# Patient Record
Sex: Male | Born: 1935
Health system: Southern US, Community
[De-identification: ages and names within clinical notes are randomized; demographics above are authoritative.]

## PROBLEM LIST (undated history)

## (undated) DIAGNOSIS — N3289 Other specified disorders of bladder: Secondary | ICD-10-CM

## (undated) DIAGNOSIS — R634 Abnormal weight loss: Secondary | ICD-10-CM

## (undated) DIAGNOSIS — F329 Major depressive disorder, single episode, unspecified: Secondary | ICD-10-CM

## (undated) DIAGNOSIS — N400 Enlarged prostate without lower urinary tract symptoms: Secondary | ICD-10-CM

## (undated) DIAGNOSIS — I6529 Occlusion and stenosis of unspecified carotid artery: Secondary | ICD-10-CM

## (undated) DIAGNOSIS — K5730 Diverticulosis of large intestine without perforation or abscess without bleeding: Secondary | ICD-10-CM

## (undated) DIAGNOSIS — Z8546 Personal history of malignant neoplasm of prostate: Secondary | ICD-10-CM

## (undated) DIAGNOSIS — K645 Perianal venous thrombosis: Secondary | ICD-10-CM

## (undated) DIAGNOSIS — N170 Acute kidney failure with tubular necrosis: Secondary | ICD-10-CM

## (undated) DIAGNOSIS — E7800 Pure hypercholesterolemia, unspecified: Secondary | ICD-10-CM

## (undated) DIAGNOSIS — L719 Rosacea, unspecified: Secondary | ICD-10-CM

## (undated) DIAGNOSIS — M109 Gout, unspecified: Secondary | ICD-10-CM

## (undated) DIAGNOSIS — I10 Essential (primary) hypertension: Secondary | ICD-10-CM

## (undated) HISTORY — PX: CAROTID ENDARTERECTOMY: SUR193

## (undated) HISTORY — DX: Rosacea, unspecified: L71.9

## (undated) HISTORY — DX: Benign prostatic hyperplasia without lower urinary tract symptoms: N40.0

## (undated) HISTORY — DX: Other specified disorders of bladder: N32.89

## (undated) HISTORY — PX: CATARACT EXTRACTION: SUR2

## (undated) HISTORY — DX: Major depressive disorder, single episode, unspecified: F32.9

## (undated) HISTORY — DX: Abnormal weight loss: R63.4

## (undated) HISTORY — DX: Essential (primary) hypertension: I10

## (undated) HISTORY — DX: Diverticulosis of large intestine without perforation or abscess without bleeding: K57.30

## (undated) HISTORY — DX: Personal history of malignant neoplasm of prostate: Z85.46

## (undated) HISTORY — DX: Occlusion and stenosis of unspecified carotid artery: I65.29

## (undated) HISTORY — DX: Perianal venous thrombosis: K64.5

## (undated) HISTORY — DX: Gout, unspecified: M10.9

## (undated) HISTORY — DX: Acute kidney failure with tubular necrosis: N17.0

## (undated) HISTORY — DX: Pure hypercholesterolemia, unspecified: E78.00

---

## 2004-04-04 ENCOUNTER — Ambulatory Visit: Payer: Self-pay | Admitting: Internal Medicine

## 2004-07-05 ENCOUNTER — Ambulatory Visit: Payer: Self-pay | Admitting: Internal Medicine

## 2004-10-12 ENCOUNTER — Ambulatory Visit: Payer: Self-pay | Admitting: Internal Medicine

## 2005-01-06 ENCOUNTER — Ambulatory Visit: Payer: Self-pay | Admitting: Internal Medicine

## 2005-04-10 ENCOUNTER — Ambulatory Visit: Payer: Self-pay | Admitting: Internal Medicine

## 2005-04-20 ENCOUNTER — Encounter: Payer: Self-pay | Admitting: Internal Medicine

## 2005-04-20 ENCOUNTER — Ambulatory Visit: Payer: Self-pay

## 2005-07-10 ENCOUNTER — Ambulatory Visit: Payer: Self-pay | Admitting: Internal Medicine

## 2005-07-12 ENCOUNTER — Ambulatory Visit: Payer: Self-pay | Admitting: Oncology

## 2005-07-27 ENCOUNTER — Encounter: Payer: Self-pay | Admitting: Internal Medicine

## 2005-07-27 LAB — COMPREHENSIVE METABOLIC PANEL
ALT: 19 U/L (ref 0–40)
AST: 22 U/L (ref 0–37)
Albumin: 4.1 g/dL (ref 3.5–5.2)
Alkaline Phosphatase: 64 U/L (ref 39–117)
BUN: 20 mg/dL (ref 6–23)
CO2: 24 mEq/L (ref 19–32)
Calcium: 9.2 mg/dL (ref 8.4–10.5)
Chloride: 103 mEq/L (ref 96–112)
Creatinine, Ser: 1.59 mg/dL — ABNORMAL HIGH (ref 0.40–1.50)
Glucose, Bld: 103 mg/dL — ABNORMAL HIGH (ref 70–99)
Potassium: 4.4 mEq/L (ref 3.5–5.3)
Sodium: 137 mEq/L (ref 135–145)
Total Bilirubin: 0.7 mg/dL (ref 0.3–1.2)
Total Protein: 6.8 g/dL (ref 6.0–8.3)

## 2005-07-27 LAB — CBC WITH DIFFERENTIAL/PLATELET
BASO%: 0.4 % (ref 0.0–2.0)
Basophils Absolute: 0 10*3/uL (ref 0.0–0.1)
EOS%: 1.9 % (ref 0.0–7.0)
Eosinophils Absolute: 0.1 10*3/uL (ref 0.0–0.5)
HCT: 42.7 % (ref 38.7–49.9)
HGB: 14.7 g/dL (ref 13.0–17.1)
LYMPH%: 22.9 % (ref 14.0–48.0)
MCH: 31.3 pg (ref 28.0–33.4)
MCHC: 34.4 g/dL (ref 32.0–35.9)
MCV: 90.9 fL (ref 81.6–98.0)
MONO#: 0.6 10*3/uL (ref 0.1–0.9)
MONO%: 9.1 % (ref 0.0–13.0)
NEUT#: 4.4 10*3/uL (ref 1.5–6.5)
NEUT%: 65.7 % (ref 40.0–75.0)
Platelets: 183 10*3/uL (ref 145–400)
RBC: 4.7 10*6/uL (ref 4.20–5.71)
RDW: 14.1 % (ref 11.2–14.6)
WBC: 6.7 10*3/uL (ref 4.0–10.0)
lymph#: 1.5 10*3/uL (ref 0.9–3.3)

## 2005-07-27 LAB — PSA: PSA: 7.92 ng/mL — ABNORMAL HIGH (ref 0.10–4.00)

## 2005-08-10 ENCOUNTER — Encounter: Payer: Self-pay | Admitting: Internal Medicine

## 2005-08-16 ENCOUNTER — Encounter: Payer: Self-pay | Admitting: Internal Medicine

## 2005-10-23 ENCOUNTER — Ambulatory Visit: Payer: Self-pay | Admitting: Internal Medicine

## 2005-10-25 ENCOUNTER — Ambulatory Visit: Payer: Self-pay | Admitting: Oncology

## 2006-02-20 ENCOUNTER — Ambulatory Visit: Payer: Self-pay | Admitting: Internal Medicine

## 2006-07-25 DIAGNOSIS — Z8546 Personal history of malignant neoplasm of prostate: Secondary | ICD-10-CM

## 2006-07-25 DIAGNOSIS — E7800 Pure hypercholesterolemia, unspecified: Secondary | ICD-10-CM | POA: Insufficient documentation

## 2006-07-25 DIAGNOSIS — I10 Essential (primary) hypertension: Secondary | ICD-10-CM

## 2006-07-25 DIAGNOSIS — F3289 Other specified depressive episodes: Secondary | ICD-10-CM

## 2006-07-25 DIAGNOSIS — F329 Major depressive disorder, single episode, unspecified: Secondary | ICD-10-CM

## 2006-07-25 DIAGNOSIS — M109 Gout, unspecified: Secondary | ICD-10-CM

## 2006-07-25 HISTORY — DX: Other specified depressive episodes: F32.89

## 2006-07-25 HISTORY — DX: Pure hypercholesterolemia, unspecified: E78.00

## 2006-07-25 HISTORY — DX: Essential (primary) hypertension: I10

## 2006-07-25 HISTORY — DX: Major depressive disorder, single episode, unspecified: F32.9

## 2006-07-25 HISTORY — DX: Personal history of malignant neoplasm of prostate: Z85.46

## 2006-07-25 HISTORY — DX: Gout, unspecified: M10.9

## 2006-08-08 ENCOUNTER — Encounter (INDEPENDENT_AMBULATORY_CARE_PROVIDER_SITE_OTHER): Payer: Self-pay

## 2006-08-09 ENCOUNTER — Ambulatory Visit: Payer: Self-pay | Admitting: Internal Medicine

## 2006-11-26 ENCOUNTER — Encounter: Payer: Self-pay | Admitting: Internal Medicine

## 2006-12-07 ENCOUNTER — Ambulatory Visit: Payer: Self-pay | Admitting: Internal Medicine

## 2006-12-07 DIAGNOSIS — K5289 Other specified noninfective gastroenteritis and colitis: Secondary | ICD-10-CM | POA: Insufficient documentation

## 2006-12-17 ENCOUNTER — Ambulatory Visit: Payer: Self-pay | Admitting: Internal Medicine

## 2006-12-17 DIAGNOSIS — R300 Dysuria: Secondary | ICD-10-CM | POA: Insufficient documentation

## 2006-12-17 LAB — CONVERTED CEMR LAB
ALT: 43 units/L (ref 0–53)
AST: 37 units/L (ref 0–37)
Albumin: 3.4 g/dL — ABNORMAL LOW (ref 3.5–5.2)
Alkaline Phosphatase: 95 units/L (ref 39–117)
BUN: 29 mg/dL — ABNORMAL HIGH (ref 6–23)
Basophils Absolute: 0.1 10*3/uL (ref 0.0–0.1)
Basophils Relative: 0.6 % (ref 0.0–1.0)
Bilirubin Urine: NEGATIVE
Bilirubin, Direct: 0.4 mg/dL — ABNORMAL HIGH (ref 0.0–0.3)
CO2: 29 meq/L (ref 19–32)
Calcium: 9.3 mg/dL (ref 8.4–10.5)
Chloride: 102 meq/L (ref 96–112)
Creatinine, Ser: 1.9 mg/dL — ABNORMAL HIGH (ref 0.4–1.5)
Eosinophils Absolute: 0.1 10*3/uL (ref 0.0–0.6)
Eosinophils Relative: 0.6 % (ref 0.0–5.0)
GFR calc Af Amer: 44 mL/min
GFR calc non Af Amer: 37 mL/min
Glucose, Bld: 111 mg/dL — ABNORMAL HIGH (ref 70–99)
Glucose, Urine, Semiquant: NEGATIVE
HCT: 39.5 % (ref 39.0–52.0)
Hemoglobin: 14.3 g/dL (ref 13.0–17.0)
Lymphocytes Relative: 6.8 % — ABNORMAL LOW (ref 12.0–46.0)
MCHC: 35.9 g/dL (ref 30.0–36.0)
MCV: 91.9 fL (ref 78.0–100.0)
Monocytes Absolute: 0.7 10*3/uL (ref 0.2–0.7)
Monocytes Relative: 5.7 % (ref 3.0–11.0)
Neutro Abs: 11.3 10*3/uL — ABNORMAL HIGH (ref 1.4–7.7)
Nitrite: NEGATIVE
Platelets: 317 10*3/uL (ref 150–400)
Potassium: 4.6 meq/L (ref 3.5–5.1)
Protein, U semiquant: >=300
RBC: 4.41 M/uL (ref 4.22–5.81)
RDW: 12.8 % (ref 11.5–14.6)
Sodium: 140 meq/L (ref 135–145)
Specific Gravity, Urine: >=1.03
Total Bilirubin: 1.1 mg/dL (ref 0.3–1.2)
Total Protein: 6.7 g/dL (ref 6.0–8.3)
Urobilinogen, UA: 1
WBC: 12.8 10*3/uL — ABNORMAL HIGH (ref 4.5–10.5)
pH: 6

## 2006-12-20 ENCOUNTER — Telehealth: Payer: Self-pay | Admitting: Internal Medicine

## 2006-12-23 ENCOUNTER — Inpatient Hospital Stay (HOSPITAL_COMMUNITY): Admission: EM | Admit: 2006-12-23 | Discharge: 2006-12-29 | Payer: Self-pay | Admitting: Emergency Medicine

## 2006-12-23 ENCOUNTER — Ambulatory Visit: Payer: Self-pay | Admitting: Internal Medicine

## 2006-12-23 ENCOUNTER — Encounter: Payer: Self-pay | Admitting: Internal Medicine

## 2006-12-24 ENCOUNTER — Encounter: Payer: Self-pay | Admitting: Internal Medicine

## 2006-12-25 ENCOUNTER — Encounter: Payer: Self-pay | Admitting: Internal Medicine

## 2006-12-27 ENCOUNTER — Ambulatory Visit: Payer: Self-pay | Admitting: Gastroenterology

## 2007-01-01 ENCOUNTER — Ambulatory Visit: Payer: Self-pay | Admitting: Internal Medicine

## 2007-01-01 LAB — CONVERTED CEMR LAB
BUN: 39 mg/dL — ABNORMAL HIGH (ref 6–23)
Basophils Absolute: 0 10*3/uL (ref 0.0–0.1)
Basophils Relative: 0.1 % (ref 0.0–1.0)
CO2: 26 meq/L (ref 19–32)
Calcium: 8.4 mg/dL (ref 8.4–10.5)
Chloride: 105 meq/L (ref 96–112)
Creatinine, Ser: 3.4 mg/dL — ABNORMAL HIGH (ref 0.4–1.5)
Eosinophils Absolute: 0.1 10*3/uL (ref 0.0–0.6)
Eosinophils Relative: 1.3 % (ref 0.0–5.0)
GFR calc Af Amer: 23 mL/min
GFR calc non Af Amer: 19 mL/min
Glucose, Bld: 110 mg/dL — ABNORMAL HIGH (ref 70–99)
HCT: 33.3 % — ABNORMAL LOW (ref 39.0–52.0)
Hemoglobin: 11.7 g/dL — ABNORMAL LOW (ref 13.0–17.0)
Lymphocytes Relative: 14.9 % (ref 12.0–46.0)
MCHC: 35.2 g/dL (ref 30.0–36.0)
MCV: 89.3 fL (ref 78.0–100.0)
Monocytes Absolute: 0.7 10*3/uL (ref 0.2–0.7)
Monocytes Relative: 9.3 % (ref 3.0–11.0)
Neutro Abs: 5.8 10*3/uL (ref 1.4–7.7)
Neutrophils Relative %: 74.4 % (ref 43.0–77.0)
Platelets: 192 10*3/uL (ref 150–400)
Potassium: 3.8 meq/L (ref 3.5–5.1)
RBC: 3.73 M/uL — ABNORMAL LOW (ref 4.22–5.81)
RDW: 12.9 % (ref 11.5–14.6)
Sodium: 142 meq/L (ref 135–145)
WBC: 7.7 10*3/uL (ref 4.5–10.5)

## 2007-01-04 ENCOUNTER — Ambulatory Visit: Payer: Self-pay | Admitting: Internal Medicine

## 2007-01-04 DIAGNOSIS — N170 Acute kidney failure with tubular necrosis: Secondary | ICD-10-CM

## 2007-01-04 HISTORY — DX: Acute kidney failure with tubular necrosis: N17.0

## 2007-01-08 ENCOUNTER — Encounter: Payer: Self-pay | Admitting: Internal Medicine

## 2007-01-25 ENCOUNTER — Ambulatory Visit: Payer: Self-pay | Admitting: Gastroenterology

## 2007-01-25 LAB — CONVERTED CEMR LAB
BUN: 18 mg/dL (ref 6–23)
CO2: 26 meq/L (ref 19–32)
Calcium: 9.2 mg/dL (ref 8.4–10.5)
Chloride: 104 meq/L (ref 96–112)
Creatinine, Ser: 1.5 mg/dL (ref 0.4–1.5)
GFR calc Af Amer: 58 mL/min
GFR calc non Af Amer: 48 mL/min
Glucose, Bld: 99 mg/dL (ref 70–99)
Potassium: 4.3 meq/L (ref 3.5–5.1)
Sodium: 137 meq/L (ref 135–145)

## 2007-02-01 ENCOUNTER — Encounter: Payer: Self-pay | Admitting: Gastroenterology

## 2007-02-01 ENCOUNTER — Ambulatory Visit: Payer: Self-pay | Admitting: Gastroenterology

## 2007-02-01 DIAGNOSIS — K5730 Diverticulosis of large intestine without perforation or abscess without bleeding: Secondary | ICD-10-CM

## 2007-02-01 HISTORY — DX: Diverticulosis of large intestine without perforation or abscess without bleeding: K57.30

## 2007-02-08 ENCOUNTER — Ambulatory Visit (HOSPITAL_BASED_OUTPATIENT_CLINIC_OR_DEPARTMENT_OTHER): Admission: RE | Admit: 2007-02-08 | Discharge: 2007-02-08 | Payer: Self-pay | Admitting: Urology

## 2007-02-08 ENCOUNTER — Encounter (INDEPENDENT_AMBULATORY_CARE_PROVIDER_SITE_OTHER): Payer: Self-pay | Admitting: Urology

## 2007-02-11 ENCOUNTER — Ambulatory Visit: Payer: Self-pay | Admitting: Internal Medicine

## 2007-02-11 LAB — CONVERTED CEMR LAB
Cholesterol: 234 mg/dL (ref 0–200)
Direct LDL: 158.4 mg/dL
HDL: 37.3 mg/dL — ABNORMAL LOW (ref >39.0)
Total CHOL/HDL Ratio: 6.3
Triglycerides: 238 mg/dL (ref 0–149)
VLDL: 48 mg/dL — ABNORMAL HIGH (ref 0–40)

## 2007-02-18 ENCOUNTER — Telehealth: Payer: Self-pay | Admitting: Internal Medicine

## 2007-03-27 ENCOUNTER — Ambulatory Visit: Payer: Self-pay | Admitting: Internal Medicine

## 2007-03-27 DIAGNOSIS — H6120 Impacted cerumen, unspecified ear: Secondary | ICD-10-CM | POA: Insufficient documentation

## 2007-05-09 ENCOUNTER — Ambulatory Visit: Payer: Self-pay

## 2007-05-09 ENCOUNTER — Encounter: Payer: Self-pay | Admitting: Internal Medicine

## 2007-05-14 ENCOUNTER — Ambulatory Visit: Payer: Self-pay | Admitting: Internal Medicine

## 2007-06-11 ENCOUNTER — Ambulatory Visit: Payer: Self-pay | Admitting: Internal Medicine

## 2007-12-09 ENCOUNTER — Ambulatory Visit: Payer: Self-pay | Admitting: Internal Medicine

## 2008-05-20 ENCOUNTER — Encounter: Payer: Self-pay | Admitting: Internal Medicine

## 2008-05-20 ENCOUNTER — Ambulatory Visit: Payer: Self-pay

## 2008-06-09 ENCOUNTER — Ambulatory Visit: Payer: Self-pay | Admitting: Gastroenterology

## 2008-06-09 ENCOUNTER — Telehealth: Payer: Self-pay | Admitting: Gastroenterology

## 2008-06-09 DIAGNOSIS — R197 Diarrhea, unspecified: Secondary | ICD-10-CM | POA: Insufficient documentation

## 2008-06-10 LAB — CONVERTED CEMR LAB
BUN: 27 mg/dL — ABNORMAL HIGH (ref 6–23)
Basophils Absolute: 0.1 10*3/uL (ref 0.0–0.1)
Basophils Relative: 1 % (ref 0.0–3.0)
CO2: 30 meq/L (ref 19–32)
Calcium: 9.2 mg/dL (ref 8.4–10.5)
Chloride: 109 meq/L (ref 96–112)
Creatinine, Ser: 1.7 mg/dL — ABNORMAL HIGH (ref 0.4–1.5)
Eosinophils Absolute: 0.1 10*3/uL (ref 0.0–0.7)
Eosinophils Relative: 2.4 % (ref 0.0–5.0)
GFR calc non Af Amer: 41.43 mL/min (ref >60)
Glucose, Bld: 120 mg/dL — ABNORMAL HIGH (ref 70–99)
HCT: 41.1 % (ref 39.0–52.0)
Hemoglobin: 14.3 g/dL (ref 13.0–17.0)
Lymphocytes Relative: 25.7 % (ref 12.0–46.0)
Lymphs Abs: 1.6 10*3/uL (ref 0.7–4.0)
MCHC: 34.9 g/dL (ref 30.0–36.0)
MCV: 91.4 fL (ref 78.0–100.0)
Monocytes Absolute: 0.4 10*3/uL (ref 0.1–1.0)
Monocytes Relative: 6.9 % (ref 3.0–12.0)
Neutro Abs: 4 10*3/uL (ref 1.4–7.7)
Neutrophils Relative %: 64 % (ref 43.0–77.0)
Platelets: 166 10*3/uL (ref 150.0–400.0)
Potassium: 4.5 meq/L (ref 3.5–5.1)
RBC: 4.5 M/uL (ref 4.22–5.81)
RDW: 12.7 % (ref 11.5–14.6)
Sodium: 143 meq/L (ref 135–145)
WBC: 6.2 10*3/uL (ref 4.5–10.5)

## 2008-06-12 ENCOUNTER — Encounter: Payer: Self-pay | Admitting: Gastroenterology

## 2008-06-16 ENCOUNTER — Ambulatory Visit: Payer: Self-pay | Admitting: Gastroenterology

## 2008-06-16 LAB — CONVERTED CEMR LAB
BUN: 25 mg/dL — ABNORMAL HIGH (ref 6–23)
CO2: 28 meq/L (ref 19–32)
Calcium: 8.9 mg/dL (ref 8.4–10.5)
Chloride: 107 meq/L (ref 96–112)
Creatinine, Ser: 1.5 mg/dL (ref 0.4–1.5)
GFR calc non Af Amer: 47.86 mL/min (ref >60)
Glucose, Bld: 122 mg/dL — ABNORMAL HIGH (ref 70–99)
Potassium: 4.7 meq/L (ref 3.5–5.1)
Sodium: 140 meq/L (ref 135–145)

## 2008-06-18 ENCOUNTER — Telehealth: Payer: Self-pay | Admitting: Gastroenterology

## 2008-06-22 ENCOUNTER — Telehealth: Payer: Self-pay | Admitting: Gastroenterology

## 2008-06-23 ENCOUNTER — Ambulatory Visit: Payer: Self-pay | Admitting: Gastroenterology

## 2008-06-23 ENCOUNTER — Encounter: Payer: Self-pay | Admitting: Gastroenterology

## 2008-06-28 ENCOUNTER — Encounter: Payer: Self-pay | Admitting: Gastroenterology

## 2008-07-15 ENCOUNTER — Encounter: Payer: Self-pay | Admitting: Internal Medicine

## 2008-07-16 ENCOUNTER — Ambulatory Visit: Payer: Self-pay | Admitting: Internal Medicine

## 2008-07-17 LAB — CONVERTED CEMR LAB
ALT: 22 units/L (ref 0–53)
AST: 23 units/L (ref 0–37)
Albumin: 3.8 g/dL (ref 3.5–5.2)
Alkaline Phosphatase: 74 units/L (ref 39–117)
BUN: 24 mg/dL — ABNORMAL HIGH (ref 6–23)
Basophils Absolute: 0 10*3/uL (ref 0.0–0.1)
Basophils Relative: 0.6 % (ref 0.0–3.0)
Bilirubin, Direct: 0 mg/dL (ref 0.0–0.3)
CO2: 30 meq/L (ref 19–32)
Calcium: 9.4 mg/dL (ref 8.4–10.5)
Chloride: 106 meq/L (ref 96–112)
Creatinine, Ser: 1.4 mg/dL (ref 0.4–1.5)
Eosinophils Absolute: 0.1 10*3/uL (ref 0.0–0.7)
Eosinophils Relative: 1.9 % (ref 0.0–5.0)
GFR calc non Af Amer: 51.82 mL/min (ref >60)
Glucose, Bld: 96 mg/dL (ref 70–99)
HCT: 41.1 % (ref 39.0–52.0)
Hemoglobin: 14.3 g/dL (ref 13.0–17.0)
Lymphocytes Relative: 23.2 % (ref 12.0–46.0)
Lymphs Abs: 1.5 10*3/uL (ref 0.7–4.0)
MCHC: 34.7 g/dL (ref 30.0–36.0)
MCV: 91.7 fL (ref 78.0–100.0)
Monocytes Absolute: 0.5 10*3/uL (ref 0.1–1.0)
Monocytes Relative: 8.3 % (ref 3.0–12.0)
Neutro Abs: 4.4 10*3/uL (ref 1.4–7.7)
Neutrophils Relative %: 66 % (ref 43.0–77.0)
PSA: 5.84 ng/mL — ABNORMAL HIGH (ref 0.10–4.00)
Platelets: 150 10*3/uL (ref 150.0–400.0)
Potassium: 4.8 meq/L (ref 3.5–5.1)
RBC: 4.48 M/uL (ref 4.22–5.81)
RDW: 13 % (ref 11.5–14.6)
Sodium: 142 meq/L (ref 135–145)
TSH: 3.81 microintl units/mL (ref 0.35–5.50)
Total Bilirubin: 1.2 mg/dL (ref 0.3–1.2)
Total Protein: 6.8 g/dL (ref 6.0–8.3)
WBC: 6.5 10*3/uL (ref 4.5–10.5)

## 2008-08-26 ENCOUNTER — Telehealth: Payer: Self-pay | Admitting: Internal Medicine

## 2008-09-28 ENCOUNTER — Telehealth: Payer: Self-pay | Admitting: Internal Medicine

## 2008-09-28 DIAGNOSIS — L719 Rosacea, unspecified: Secondary | ICD-10-CM | POA: Insufficient documentation

## 2008-09-28 HISTORY — DX: Rosacea, unspecified: L71.9

## 2008-10-15 ENCOUNTER — Ambulatory Visit: Payer: Self-pay | Admitting: Internal Medicine

## 2008-12-01 ENCOUNTER — Ambulatory Visit: Payer: Self-pay | Admitting: Internal Medicine

## 2009-02-15 ENCOUNTER — Ambulatory Visit: Payer: Self-pay | Admitting: Internal Medicine

## 2009-02-15 ENCOUNTER — Telehealth: Payer: Self-pay | Admitting: Internal Medicine

## 2009-02-15 DIAGNOSIS — K645 Perianal venous thrombosis: Secondary | ICD-10-CM | POA: Insufficient documentation

## 2009-02-15 HISTORY — DX: Perianal venous thrombosis: K64.5

## 2009-03-01 ENCOUNTER — Encounter: Payer: Self-pay | Admitting: Internal Medicine

## 2009-06-01 ENCOUNTER — Encounter: Payer: Self-pay | Admitting: Internal Medicine

## 2009-06-01 DIAGNOSIS — I6529 Occlusion and stenosis of unspecified carotid artery: Secondary | ICD-10-CM | POA: Insufficient documentation

## 2009-06-01 HISTORY — DX: Occlusion and stenosis of unspecified carotid artery: I65.29

## 2009-06-02 ENCOUNTER — Ambulatory Visit: Payer: Self-pay

## 2009-06-02 ENCOUNTER — Encounter: Payer: Self-pay | Admitting: Internal Medicine

## 2009-06-15 ENCOUNTER — Ambulatory Visit: Payer: Self-pay | Admitting: Internal Medicine

## 2009-06-18 LAB — CONVERTED CEMR LAB
Cholesterol: 269 mg/dL — ABNORMAL HIGH (ref 0–200)
Direct LDL: 172.7 mg/dL
HDL: 60.2 mg/dL (ref >39.00)
Total CHOL/HDL Ratio: 4
Triglycerides: 171 mg/dL — ABNORMAL HIGH (ref 0.0–149.0)
VLDL: 34.2 mg/dL (ref 0.0–40.0)

## 2009-07-09 ENCOUNTER — Telehealth: Payer: Self-pay | Admitting: Internal Medicine

## 2009-07-09 ENCOUNTER — Ambulatory Visit: Payer: Self-pay | Admitting: Family Medicine

## 2009-07-12 ENCOUNTER — Ambulatory Visit: Payer: Self-pay | Admitting: Internal Medicine

## 2009-07-12 LAB — CONVERTED CEMR LAB
ALT: 23 units/L (ref 0–53)
AST: 24 units/L (ref 0–37)
Albumin: 4.1 g/dL (ref 3.5–5.2)
Alkaline Phosphatase: 65 units/L (ref 39–117)
BUN: 39 mg/dL — ABNORMAL HIGH (ref 6–23)
Basophils Absolute: 0 10*3/uL (ref 0.0–0.1)
Basophils Relative: 0.5 % (ref 0.0–3.0)
Bilirubin, Direct: 0.2 mg/dL (ref 0.0–0.3)
CO2: 22 meq/L (ref 19–32)
Calcium: 9 mg/dL (ref 8.4–10.5)
Chloride: 112 meq/L (ref 96–112)
Creatinine, Ser: 1.8 mg/dL — ABNORMAL HIGH (ref 0.4–1.5)
Eosinophils Absolute: 0.2 10*3/uL (ref 0.0–0.7)
Eosinophils Relative: 2.2 % (ref 0.0–5.0)
GFR calc non Af Amer: 38.68 mL/min (ref >60)
Glucose, Bld: 91 mg/dL (ref 70–99)
HCT: 42.8 % (ref 39.0–52.0)
Hemoglobin: 15.1 g/dL (ref 13.0–17.0)
Lymphocytes Relative: 22.8 % (ref 12.0–46.0)
Lymphs Abs: 1.7 10*3/uL (ref 0.7–4.0)
MCHC: 35.2 g/dL (ref 30.0–36.0)
MCV: 91.1 fL (ref 78.0–100.0)
Monocytes Absolute: 0.8 10*3/uL (ref 0.1–1.0)
Monocytes Relative: 11 % (ref 3.0–12.0)
Neutro Abs: 4.9 10*3/uL (ref 1.4–7.7)
Neutrophils Relative %: 63.5 % (ref 43.0–77.0)
PSA: 5.24 ng/mL — ABNORMAL HIGH (ref 0.10–4.00)
Platelets: 149 10*3/uL — ABNORMAL LOW (ref 150.0–400.0)
Potassium: 3.9 meq/L (ref 3.5–5.1)
RBC: 4.7 M/uL (ref 4.22–5.81)
RDW: 14.5 % (ref 11.5–14.6)
Sodium: 140 meq/L (ref 135–145)
TSH: 2.14 microintl units/mL (ref 0.35–5.50)
Total Bilirubin: 1.2 mg/dL (ref 0.3–1.2)
Total Protein: 7.1 g/dL (ref 6.0–8.3)
WBC: 7.7 10*3/uL (ref 4.5–10.5)

## 2009-08-02 ENCOUNTER — Ambulatory Visit: Payer: Self-pay | Admitting: Internal Medicine

## 2009-08-11 ENCOUNTER — Encounter: Payer: Self-pay | Admitting: Internal Medicine

## 2009-08-16 ENCOUNTER — Ambulatory Visit: Payer: Self-pay | Admitting: Internal Medicine

## 2009-08-16 LAB — CONVERTED CEMR LAB
BUN: 41 mg/dL — ABNORMAL HIGH (ref 6–23)
Basophils Absolute: 0 10*3/uL (ref 0.0–0.1)
Basophils Relative: 0.7 % (ref 0.0–3.0)
CO2: 24 meq/L (ref 19–32)
Calcium: 9.2 mg/dL (ref 8.4–10.5)
Chloride: 111 meq/L (ref 96–112)
Creatinine, Ser: 1.8 mg/dL — ABNORMAL HIGH (ref 0.4–1.5)
Eosinophils Absolute: 0.2 10*3/uL (ref 0.0–0.7)
Eosinophils Relative: 3.5 % (ref 0.0–5.0)
GFR calc non Af Amer: 38.18 mL/min (ref >60)
Glucose, Bld: 100 mg/dL — ABNORMAL HIGH (ref 70–99)
HCT: 40.9 % (ref 39.0–52.0)
Hemoglobin: 14.2 g/dL (ref 13.0–17.0)
Lymphocytes Relative: 23.1 % (ref 12.0–46.0)
Lymphs Abs: 1.4 10*3/uL (ref 0.7–4.0)
MCHC: 34.7 g/dL (ref 30.0–36.0)
MCV: 90.8 fL (ref 78.0–100.0)
Monocytes Absolute: 0.6 10*3/uL (ref 0.1–1.0)
Monocytes Relative: 9.6 % (ref 3.0–12.0)
Neutro Abs: 3.9 10*3/uL (ref 1.4–7.7)
Neutrophils Relative %: 63.1 % (ref 43.0–77.0)
Platelets: 133 10*3/uL — ABNORMAL LOW (ref 150.0–400.0)
Potassium: 4.7 meq/L (ref 3.5–5.1)
RBC: 4.5 M/uL (ref 4.22–5.81)
RDW: 14.5 % (ref 11.5–14.6)
Sed Rate: 11 mm/hr (ref 0–22)
Sodium: 141 meq/L (ref 135–145)
WBC: 6.2 10*3/uL (ref 4.5–10.5)

## 2009-08-30 ENCOUNTER — Encounter: Payer: Self-pay | Admitting: Internal Medicine

## 2009-09-01 ENCOUNTER — Ambulatory Visit: Payer: Self-pay | Admitting: Internal Medicine

## 2009-09-01 DIAGNOSIS — R634 Abnormal weight loss: Secondary | ICD-10-CM

## 2009-09-01 HISTORY — DX: Abnormal weight loss: R63.4

## 2009-09-14 ENCOUNTER — Encounter: Payer: Self-pay | Admitting: Internal Medicine

## 2009-09-27 ENCOUNTER — Ambulatory Visit: Payer: Self-pay | Admitting: Internal Medicine

## 2009-10-28 ENCOUNTER — Telehealth: Payer: Self-pay

## 2009-11-01 ENCOUNTER — Ambulatory Visit: Payer: Self-pay | Admitting: Internal Medicine

## 2009-11-10 ENCOUNTER — Encounter: Payer: Self-pay | Admitting: Internal Medicine

## 2009-12-07 ENCOUNTER — Encounter: Payer: Self-pay | Admitting: Internal Medicine

## 2009-12-15 ENCOUNTER — Ambulatory Visit: Payer: Self-pay | Admitting: Internal Medicine

## 2009-12-15 ENCOUNTER — Encounter: Payer: Self-pay | Admitting: Internal Medicine

## 2009-12-15 LAB — CONVERTED CEMR LAB
ALT: 33 units/L (ref 0–53)
AST: 35 units/L (ref 0–37)
Albumin: 4 g/dL (ref 3.5–5.2)
Alkaline Phosphatase: 71 units/L (ref 39–117)
BUN: 21 mg/dL (ref 6–23)
Basophils Absolute: 0 10*3/uL (ref 0.0–0.1)
Basophils Relative: 0.6 % (ref 0.0–3.0)
Bilirubin, Direct: 0.1 mg/dL (ref 0.0–0.3)
CO2: 26 meq/L (ref 19–32)
Calcium: 9.3 mg/dL (ref 8.4–10.5)
Chloride: 109 meq/L (ref 96–112)
Cholesterol: 146 mg/dL (ref 0–200)
Creatinine, Ser: 1.4 mg/dL (ref 0.4–1.5)
Eosinophils Absolute: 0.2 10*3/uL (ref 0.0–0.7)
Eosinophils Relative: 2.2 % (ref 0.0–5.0)
GFR calc non Af Amer: 51.63 mL/min (ref >60)
Glucose, Bld: 100 mg/dL — ABNORMAL HIGH (ref 70–99)
HCT: 42.8 % (ref 39.0–52.0)
HDL: 51.7 mg/dL (ref >39.00)
Hemoglobin: 14.6 g/dL (ref 13.0–17.0)
LDL Cholesterol: 69 mg/dL (ref 0–99)
Lymphocytes Relative: 19.4 % (ref 12.0–46.0)
Lymphs Abs: 1.5 10*3/uL (ref 0.7–4.0)
MCHC: 34.1 g/dL (ref 30.0–36.0)
MCV: 91.9 fL (ref 78.0–100.0)
Monocytes Absolute: 0.7 10*3/uL (ref 0.1–1.0)
Monocytes Relative: 8.8 % (ref 3.0–12.0)
Neutro Abs: 5.5 10*3/uL (ref 1.4–7.7)
Neutrophils Relative %: 69 % (ref 43.0–77.0)
Platelets: 159 10*3/uL (ref 150.0–400.0)
Potassium: 4.6 meq/L (ref 3.5–5.1)
RBC: 4.66 M/uL (ref 4.22–5.81)
RDW: 14 % (ref 11.5–14.6)
Sodium: 144 meq/L (ref 135–145)
TSH: 3.7 microintl units/mL (ref 0.35–5.50)
Total Bilirubin: 0.8 mg/dL (ref 0.3–1.2)
Total CHOL/HDL Ratio: 3
Total Protein: 6.9 g/dL (ref 6.0–8.3)
Triglycerides: 125 mg/dL (ref 0.0–149.0)
VLDL: 25 mg/dL (ref 0.0–40.0)
WBC: 7.9 10*3/uL (ref 4.5–10.5)

## 2009-12-16 ENCOUNTER — Encounter: Payer: Self-pay | Admitting: Internal Medicine

## 2009-12-27 ENCOUNTER — Encounter: Payer: Self-pay | Admitting: Internal Medicine

## 2009-12-29 ENCOUNTER — Encounter: Payer: Self-pay | Admitting: Internal Medicine

## 2010-02-13 LAB — CONVERTED CEMR LAB
ALT: 30 U/L
AST: 29 U/L
Albumin: 4 g/dL
Alkaline Phosphatase: 59 U/L
BUN: 22 mg/dL
Basophils Absolute: 0 K/uL
Basophils Relative: 0.3 %
Bilirubin, Direct: 0.1 mg/dL
CO2: 32 meq/L
Calcium: 9.8 mg/dL
Chloride: 106 meq/L
Creatinine, Ser: 1.6 mg/dL — ABNORMAL HIGH
Eosinophils Absolute: 0.2 K/uL
Eosinophils Relative: 3.4 %
GFR calc Af Amer: 54 mL/min
GFR calc non Af Amer: 45 mL/min
Glucose, Bld: 104 mg/dL — ABNORMAL HIGH
HCT: 43.1 %
Hemoglobin: 14.9 g/dL
Lymphocytes Relative: 25.2 %
MCHC: 34.6 g/dL
MCV: 91.9 fL
Monocytes Absolute: 0.6 K/uL
Monocytes Relative: 9.4 %
Neutro Abs: 3.6 K/uL
Neutrophils Relative %: 61.7 %
Platelets: 146 K/uL — ABNORMAL LOW
Potassium: 4.9 meq/L
RBC: 4.7 M/uL
RDW: 13.4 %
Sodium: 144 meq/L
TSH: 2.84 u[IU]/mL
Total Bilirubin: 0.9 mg/dL
Total Protein: 6.6 g/dL
WBC: 5.9 10*3/microliter

## 2010-02-15 NOTE — Assessment & Plan Note (Signed)
Summary: FOLLOW UP ON BP/PER DR TODD FOR PM APPT ON MON/CJR   Vital Signs:  Patient profile:   77 year old male Weight:      152 pounds BP sitting:   122 / 60  (right arm) Cuff size:   regular  Vitals Entered By: Cay Schillings LPN (June 27, 624THL D34-534 PM) CC: f/u on elevated BP at eye doctor last week - placed on med Is Patient Diabetic? No   Primary Care Provider:  Bluford Kaufmann, MD  CC:  f/u on elevated BP at eye doctor last week - placed on med.  History of Present Illness: 77 year old patient who has chronic hypertension.  He also has remote history of acute renal failure, secondary to severe dehydration.  He complains of some mild fatigue.  He has been placed on Diamox recently due to uncontrolled glaucoma.  He was seen last week by ophthalmology with elevated systemic blood pressures as well.  He was seen in follow-up.  Last week with a normal blood pressure.  Earlier today, blood pressure was as low as 98/44. He has a history carotid artery disease, and recent Doppler ultrasound has been stable  Allergies (verified): No Known Drug Allergies  Past History:  Past Medical History: Reviewed history from 02/11/2007 and no changes required. Prostate cancer, hx of Depression Gout Hypertension Borderline glaucoma mild renal insufficiency history of acute renal failure  Review of Systems       The patient complains of anorexia and muscle weakness.  The patient denies fever, weight loss, weight gain, vision loss, decreased hearing, hoarseness, chest pain, syncope, dyspnea on exertion, peripheral edema, prolonged cough, headaches, hemoptysis, abdominal pain, melena, hematochezia, severe indigestion/heartburn, hematuria, incontinence, genital sores, suspicious skin lesions, transient blindness, difficulty walking, depression, unusual weight change, abnormal bleeding, enlarged lymph nodes, angioedema, breast masses, and testicular masses.    Physical Exam  General:   Well-developed,well-nourished,in no acute distress; alert,appropriate and cooperative throughout examination;122/60.  Both arms Head:  Normocephalic and atraumatic without obvious abnormalities. No apparent alopecia or balding. Mouth:  Oral mucosa and oropharynx without lesions or exudates.  Teeth in good repair. Neck:  No deformities, masses, or tenderness noted. Lungs:  Normal respiratory effort, chest expands symmetrically. Lungs are clear to auscultation, no crackles or wheezes. Heart:  Normal rate and regular rhythm. S1 and S2 normal without gallop, murmur, click, rub or other extra sounds. Abdomen:  Bowel sounds positive,abdomen soft and non-tender without masses, organomegaly or hernias noted. Msk:  No deformity or scoliosis noted of thoracic or lumbar spine.   Pulses:  R and L carotid,radial,femoral,dorsalis pedis and posterior tibial pulses are full and equal bilaterally Extremities:  No clubbing, cyanosis, edema, or deformity noted with normal full range of motion of all joints.   Skin:  Intact without suspicious lesions or rashes Cervical Nodes:  No lymphadenopathy noted   Impression & Recommendations:  Problem # 1:  CAROTID ARTERY DISEASE (ICD-433.10)  His updated medication list for this problem includes:    Bayer Aspirin 325 Mg Tabs (Aspirin) .Marland Kitchen... 1 once daily stable    His updated medication list for this problem includes:    Bayer Aspirin 325 Mg Tabs (Aspirin) .Marland Kitchen... 1 once daily  Orders: TLB-BMP (Basic Metabolic Panel-BMET) (99991111) TLB-CBC Platelet - w/Differential (85025-CBCD) TLB-Hepatic/Liver Function Pnl (80076-HEPATIC) Venipuncture [id number])  Problem # 2:  HYPERCHOLESTEROLEMIA (ICD-272.0)  His updated medication list for this problem includes:    Niacin Cr 500 Mg Cpcr (Niacin) .Marland Kitchen... 1 two times a day  Vytorin 10-20 Mg Tabs (Ezetimibe-simvastatin) ..... One daily    His updated medication list for this problem includes:    Niacin Cr 500 Mg Cpcr  (Niacin) .Marland Kitchen... 1 two times a day    Vytorin 10-20 Mg Tabs (Ezetimibe-simvastatin) ..... One daily  Orders: TLB-BMP (Basic Metabolic Panel-BMET) (99991111) TLB-CBC Platelet - w/Differential (85025-CBCD) TLB-Hepatic/Liver Function Pnl (80076-HEPATIC) TLB-TSH (Thyroid Stimulating Hormone) (84443-TSH)  Problem # 3:  HYPERTENSION (ICD-401.9)  His updated medication list for this problem includes:    Monopril 20 Mg Tabs (Fosinopril sodium) ..... One daily    Acetazolamide 250 Mg Tabs (Acetazolamide) ..... Bid appears well controlled at present.  In view of his mild renal insufficiency, and recent addition of Diamox will check electrolytes    His updated medication list for this problem includes:    Monopril 20 Mg Tabs (Fosinopril sodium) ..... One daily    Acetazolamide 250 Mg Tabs (Acetazolamide) ..... Bid  Orders: TLB-BMP (Basic Metabolic Panel-BMET) (99991111) TLB-CBC Platelet - w/Differential (85025-CBCD) TLB-Hepatic/Liver Function Pnl (80076-HEPATIC)  Complete Medication List: 1)  Flomax 0.4 Mg Cp24 (Tamsulosin hcl) .Marland Kitchen.. 1 once daily 2)  Bayer Aspirin 325 Mg Tabs (Aspirin) .Marland Kitchen.. 1 once daily 3)  Niacin Cr 500 Mg Cpcr (Niacin) .Marland Kitchen.. 1 two times a day 4)  Monopril 20 Mg Tabs (Fosinopril sodium) .... One daily 5)  Travatan 0.004 % Soln (Travoprost) .... Use ou once daily 6)  Singulair 10 Mg Tabs (Montelukast sodium) .... Take 1 tablet by mouth once a day 7)  Proctocare-hc 2.5 % Crea (Hydrocortisone) .... Use four times daily 8)  Vytorin 10-20 Mg Tabs (Ezetimibe-simvastatin) .... One daily 9)  Acetazolamide 250 Mg Tabs (Acetazolamide) .... Bid  Other Orders: TLB-PSA (Prostate Specific Antigen) (84153-PSA)  Patient Instructions: 1)  Please schedule a follow-up appointment in 1 month. 2)  Limit your Sodium (Salt). 3)  It is important that you exercise regularly at least 20 minutes 5 times a week. If you develop chest pain, have severe difficulty breathing, or feel very  tired , stop exercising immediately and seek medical attention. 4)  follow-up ophthalmology this week as scheduled

## 2010-02-15 NOTE — Consult Note (Signed)
Summary: [facility]   Imported By: Laural Benes 03/10/2009 10:49:53  _____________________________________________________________________  External Attachment:    Type:   Image     Comment:   External Document

## 2010-02-15 NOTE — Assessment & Plan Note (Signed)
Summary: FLU-SHOT/RCD   Nurse Visit   Allergies: No Known Drug Allergies

## 2010-02-15 NOTE — Assessment & Plan Note (Signed)
Summary: pt will come in fasting/njr/WIFE RESCD FROM BUMP//CCM   Vital Signs:  Patient profile:   77 year old male Weight:      162 pounds BMI:     27.48 BP sitting:   150 / 62  (left arm) Cuff size:   regular  Vitals Entered By: Chipper Oman, RN (July 16, 2008 8:55 AM)  Primary Care Provider:  Bluford Kaufmann, MD  CC:  OV and fasting. Using steroid eye drops for inflammation.Alexander Mcdonald  History of Present Illness: 77 year old patient is seen today for a comprehensive evaluation.  Medical problems include hypertension, hyperuricemia, hypercholesterolemia, and a history of gout.  He has glaucoma and a history of clinical depression.  He is followed by urology for prostate cancer, diagnosed in 2002.  He is treated hypertension, which has been stable.  He has had a colonoscopy earlier this year.  He is status post left carotid endarterectomy and a follow-up of carotid Doppler ultrasound was negative in April of this year.  He denies any cardiopulmonary complaints.  No history of gout.  He is scheduled for urological follow-up soon  Allergies: No Known Drug Allergies  Past History:  Past Medical History: Reviewed history from 02/11/2007 and no changes required. Prostate cancer, hx of Depression Gout Hypertension Borderline glaucoma mild renal insufficiency history of acute renal failure  Past Surgical History: Carotid endarterectomy diagnosed with prostate cancer in 2002.  He was Gleason 6 involve the right middle lobe Cataract extraction right eye 2009  colonoscopy June 2010 carotid artery Doppler ultrasound, April 2010  Family History: Reviewed history from 08/09/2006 and no changes required. Fam hx Leukemia Family History Hypertension both parents died at 20 brother may have had coronary artery disease.  Sister died of leukemia two brothers, 3 sisters  Social History: Reviewed history from 07/25/2006 and no changes required. Married Former Smoker Alcohol  use-yes  Review of Systems  The patient denies anorexia, fever, weight loss, weight gain, vision loss, decreased hearing, hoarseness, chest pain, syncope, dyspnea on exertion, peripheral edema, prolonged cough, headaches, hemoptysis, abdominal pain, melena, hematochezia, severe indigestion/heartburn, hematuria, incontinence, genital sores, muscle weakness, suspicious skin lesions, transient blindness, difficulty walking, depression, unusual weight change, abnormal bleeding, enlarged lymph nodes, angioedema, breast masses, and testicular masses.    Physical Exam  General:  Well-developed,well-nourished,in no acute distress; alert,appropriate and cooperative throughout examination Head:  Normocephalic and atraumatic without obvious abnormalities. No apparent alopecia or balding. Eyes:  No corneal or conjunctival inflammation noted. EOMI. Perrla. Funduscopic exam benign, without hemorrhages, exudates or papilledema. Vision grossly normal. Ears:  External ear exam shows no significant lesions or deformities.  Otoscopic examination reveals clear canals, tympanic membranes are intact bilaterally without bulging, retraction, inflammation or discharge. Hearing is grossly normal bilaterally. Mouth:  dentures in place.  No lesions Neck:  No deformities, masses, or tenderness noted. Chest Wall:  No deformities, masses, tenderness or gynecomastia noted. Breasts:  No masses or gynecomastia noted Lungs:  Normal respiratory effort, chest expands symmetrically. Lungs are clear to auscultation, no crackles or wheezes. Heart:  Normal rate and regular rhythm. S1 and S2 normal without gallop, murmur, click, rub or other extra sounds. Abdomen:  Bowel sounds positive,abdomen soft and non-tender without masses, organomegaly or hernias noted. Genitalia:  Testes bilaterally descended without nodularity, tenderness or masses. No scrotal masses or lesions. No penis lesions or urethral discharge. Msk:  No deformity or  scoliosis noted of thoracic or lumbar spine.   Pulses:  R and L carotid,radial,femoral,dorsalis pedis and posterior  tibial pulses are full and equal bilaterally Extremities:  No clubbing, cyanosis, edema, or deformity noted with normal full range of motion of all joints.   Neurologic:  No cranial nerve deficits noted. Station and gait are normal. Plantar reflexes are down-going bilaterally. DTRs are symmetrical throughout. Sensory, motor and coordinative functions appear intact. Skin:  Intact without suspicious lesions or rashes Cervical Nodes:  No lymphadenopathy noted Axillary Nodes:  No palpable lymphadenopathy Inguinal Nodes:  No significant adenopathy Psych:  Cognition and judgment appear intact. Alert and cooperative with normal attention span and concentration. No apparent delusions, illusions, hallucinations   Impression & Recommendations:  Problem # 1:  HYPERCHOLESTEROLEMIA (ICD-272.0)  His updated medication list for this problem includes:    Niacin Cr 500 Mg Cpcr (Niacin) .Alexander Mcdonald... 1 two times a day  Orders: TLB-TSH (Thyroid Stimulating Hormone) (84443-TSH)  Problem # 2:  HYPERTENSION (ICD-401.9)  His updated medication list for this problem includes:    Monopril 20 Mg Tabs (Fosinopril sodium) ..... One daily  Orders: EKG w/ Interpretation (93000) Venipuncture HR:[id number]) TLB-BMP (Basic Metabolic Panel-BMET) (99991111) TLB-CBC Platelet - w/Differential (85025-CBCD) TLB-Hepatic/Liver Function Pnl (80076-HEPATIC) Prescription Created Electronically [phone])  Problem # 3:  GOUT (ICD-274.9)  Orders: Venipuncture HR:[id number]) TLB-BMP (Basic Metabolic Panel-BMET) (99991111) TLB-CBC Platelet - w/Differential (85025-CBCD) TLB-Hepatic/Liver Function Pnl (80076-HEPATIC)  Problem # 4:  PROSTATE CANCER, HX OF (ICD-V10.46)  Orders: TLB-PSA (Prostate Specific Antigen) (84153-PSA)  Problem # 5:  DEPRESSION (ICD-311)  Orders: Venipuncture HR:[id number]) TLB-BMP (Basic Metabolic  Panel-BMET) (99991111) TLB-CBC Platelet - w/Differential (85025-CBCD) TLB-Hepatic/Liver Function Pnl (80076-HEPATIC)  Complete Medication List: 1)  Flomax 0.4 Mg Cp24 (Tamsulosin hcl) .Alexander Mcdonald.. 1 once daily 2)  Bayer Aspirin 325 Mg Tabs (Aspirin) .Alexander Mcdonald.. 1 once daily 3)  Niacin Cr 500 Mg Cpcr (Niacin) .Alexander Mcdonald.. 1 two times a day 4)  Monopril 20 Mg Tabs (Fosinopril sodium) .... One daily 5)  Travatan 0.004 % Soln (Travoprost) .... Use ou once daily  Patient Instructions: 1)  Please schedule a follow-up appointment in 4 months. 2)  Limit your Sodium (Salt). 3)  It is important that you exercise regularly at least 20 minutes 5 times a week. If you develop chest pain, have severe difficulty breathing, or feel very tired , stop exercising immediately and seek medical attention. Prescriptions: MONOPRIL 20 MG  TABS (FOSINOPRIL SODIUM) one daily  #90 x 6   Entered and Authorized by:   Marletta Lor  MD   Signed by:   Marletta Lor  MD on 07/16/2008   Method used:   Print then Give to Patient   RxID:   [id number] FLOMAX 0.4 MG  CP24 (TAMSULOSIN HCL) 1 once daily  #90 x 6   Entered and Authorized by:   Marletta Lor  MD   Signed by:   Marletta Lor  MD on 07/16/2008   Method used:   Print then Give to Patient   RxID:   [id number] MONOPRIL 20 MG  TABS (FOSINOPRIL SODIUM) one daily  #90 x 6   Entered and Authorized by:   Marletta Lor  MD   Signed by:   Marletta Lor  MD on 07/16/2008   Method used:   Electronically to        [facility] # [phone]* (retail)       [address]       [place], [place]  [address]       Ph: [phone]       Fax: [phone]  [id number] FLOMAX 0.4 MG  CP24 (TAMSULOSIN HCL) 1 once daily  #90 x 6   Entered and Authorized by:   Marletta Lor  MD   Signed by:   Marletta Lor  MD on 07/16/2008   Method used:   Electronically to        [facility] # [phone]* (retail)       [address]       [place], [place]  [address]       Ph: [phone]       Fax: [phone]   RxID:   [id number]

## 2010-02-15 NOTE — Progress Notes (Signed)
Summary: Pts bp is 200/90. Pls call.   Phone Note Call from Patient Call back at [phone] Kerin Ransom: daughter - Barnett Applebaum Summary of Call: Pts daughter called and said that pts bp is 200/90 and is needing an appt with Dr. Burnice Logan asap. Pls call.  Initial call taken by: Braulio Bosch,  October 28, 2009 10:34 AM  Follow-up for Phone Call        spoke with dusghter - they were at pre o appt for eye surgery and BP was elevated   going home now - will retake and call me with numbers - if still high - will see Follow-up by: Cay Schillings LPN,  October 13, 624THL 11:15 AM  Additional Follow-up for Phone Call Additional follow up Details #1::        Gina called - bp 152/78 (L) and 146/62(R) .   Ok to continue watching , take maybe 430 or 5pm , call if elevated . take in AM after up for awhile and call . Surgery on tuesday - if high - will bring him in to see. KIK Additional Follow-up by: Cay Schillings LPN,  October 13, 624THL 1:32 PM

## 2010-02-15 NOTE — Letter (Signed)
Summary: [facility]   Imported By: Laural Benes 06/13/2007 12:57:38  _____________________________________________________________________  External Attachment:    Type:   Image     Comment:   External Document

## 2010-02-15 NOTE — Assessment & Plan Note (Signed)
Summary: emp---will fast//ccm   Vital Signs:  Patient profile:   77 year old male Height:      65 inches Weight:      150 pounds BMI:     25.05 Temp:     98.0 degrees F oral BP sitting:   140 / 80  (right arm) Cuff size:   regular  Vitals Entered By: Cay Schillings LPN (November 30, 624THL 8:29 AM) CC: cpx- doing ok Is Patient Diabetic? No   Primary Care Provider:  Bluford Kaufmann, MD  CC:  cpx- doing ok.  History of Present Illness: 77 year old patient who is seen today for a wellness exam.  He has treated in hypertension, as well as dyslipidemia.  He has a history of prostate cancer, followed by urology.  He is followed closely by ophthalmology due to glaucoma.  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  Here for Medicare AWV:  1.   Risk factors based on Past M, S, F history: cardiovascular risk factors include hypertension, and hypercholesterolemia 2.   Physical Activities: remains very active physically, although limited by partial blindness 3.   Depression/mood: history of depression, which has been stable.  No current treatment 4.   Hearing: no deficits 5.   ADL's: independent in all aspects of daily living 6.   Fall Risk: moderate due to visual deficits 7.   Home Safety: no problems identified 8.   Height, weight, &visual acuity:height and weight stable.  Light perception only involving the right eye 9.   Counseling: heart healthy diet regular.  Exercise encouraged 10.   Labs ordered based on risk factors: laboratory profile, including lipid panel be reviewed 11.           Referral Coordination- will follow-up with ophthalmology and urology 12.           Care Plan- heart healthy diet regular exercise.  All encouraged modest weight loss encouraged 13.            Cognitive Assessment- alert and oriented normal affect.  No history of memory dysfunction, handles all  executive functioning   Allergies (verified): No Known Drug Allergies  Past  History:  Past Medical History: Reviewed history from 08/16/2009 and no changes required. Prostate cancer, hx of Depression Gout Hypertension  glaucoma mild renal insufficiency history of acute renal failure  Past Surgical History: Reviewed history from 06/15/2009 and no changes required. Carotid endarterectomy diagnosed with prostate cancer in 2002.  He was Gleason 6 involve the right middle lobe Cataract extraction right eye 2009  colonoscopy June 2010 carotid artery Doppler ultrasound, April 2010, 5-11  Family History: Reviewed history from 07/16/2008 and no changes required. Fam hx Leukemia Family History Hypertension both parents died at 75 brother may have had coronary artery disease.  Sister died of leukemia two brothers, 3 sisters  Social History: Reviewed history from 07/25/2006 and no changes required. Married Former Smoker Alcohol use-yes  Review of Systems       The patient complains of vision loss.  The patient denies anorexia, fever, weight loss, weight gain, decreased hearing, hoarseness, chest pain, syncope, dyspnea on exertion, peripheral edema, prolonged cough, headaches, hemoptysis, abdominal pain, melena, hematochezia, severe indigestion/heartburn, hematuria, incontinence, genital sores, muscle weakness, suspicious skin lesions, transient blindness, difficulty walking, depression, unusual weight change, abnormal bleeding, enlarged lymph nodes, angioedema, breast masses, and testicular masses.    Physical Exam  General:  Well-developed,well-nourished,in no acute distress; alert,appropriate and cooperative throughout examination Head:  Normocephalic and atraumatic without  obvious abnormalities. No apparent alopecia or balding. Eyes:  slight external strabismus of the right eye; light perception only;  suggestive of mild right ptosis Ears:  External ear exam shows no significant lesions or deformities.  Otoscopic examination reveals clear canals, tympanic  membranes are intact bilaterally without bulging, retraction, inflammation or discharge. Hearing is grossly normal bilaterally. Nose:  External nasal examination shows no deformity or inflammation. Nasal mucosa are pink and moist without lesions or exudates. Mouth:  Oral mucosa and oropharynx without lesions or exudates.   Neck:  No deformities, masses, or tenderness noted. Chest Wall:  No deformities, masses, tenderness or gynecomastia noted. Breasts:  No masses or gynecomastia noted Lungs:  Normal respiratory effort, chest expands symmetrically. Lungs are clear to auscultation, no crackles or wheezes. Heart:  Normal rate and regular rhythm. S1 and S2 normal without gallop, murmur, click, rub or other extra sounds. Abdomen:  Bowel sounds positive,abdomen soft and non-tender without masses, organomegaly or hernias noted.  bilateral femoral bruits Rectal:  deferred to urology Genitalia:  Testes bilaterally descended without nodularity, tenderness or masses. No scrotal masses or lesions. No penis lesions or urethral discharge. Prostate:  status post prostatectomy, deferred to urology Msk:  No deformity or scoliosis noted of thoracic or lumbar spine.   Pulses:  the right dorsalis pedis pulse.  Slightly diminished Extremities:  No clubbing, cyanosis, edema, or deformity noted with normal full range of motion of all joints.   Neurologic:  No cranial nerve deficits noted. Station and gait are normal. Plantar reflexes are down-going bilaterally. DTRs are symmetrical throughout. Sensory, motor and coordinative functions appear intact. Skin:  Intact without suspicious lesions or rashes Cervical Nodes:  No lymphadenopathy noted Axillary Nodes:  No palpable lymphadenopathy Inguinal Nodes:  No significant adenopathy Psych:  Cognition and judgment appear intact. Alert and cooperative with normal attention span and concentration. No apparent delusions, illusions, hallucinations   Impression &  Recommendations:  Problem # 1:  Preventive Health Care (ICD-V70.0)  Orders: Medicare -1st Annual Wellness Visit [phone])  Problem # 2:  HYPERCHOLESTEROLEMIA (ICD-272.0)  His updated medication list for this problem includes:    Vytorin 10-20 Mg Tabs (Ezetimibe-simvastatin) ..... One daily  Orders: Venipuncture [id number]) TLB-Lipid Panel (80061-LIPID) TLB-BMP (Basic Metabolic Panel-BMET) (99991111) TLB-CBC Platelet - w/Differential (85025-CBCD) TLB-Hepatic/Liver Function Pnl (80076-HEPATIC) TLB-TSH (Thyroid Stimulating Hormone) (84443-TSH) Specimen Handling (99000)  His updated medication list for this problem includes:    Vytorin 10-20 Mg Tabs (Ezetimibe-simvastatin) ..... One daily  Problem # 3:  HYPERTENSION (ICD-401.9)  His updated medication list for this problem includes:    Fosinopril Sodium 10 Mg Tabs (Fosinopril sodium) ..... One half daily  Orders: EKG w/ Interpretation (93000) TLB-BMP (Basic Metabolic Panel-BMET) (99991111) TLB-CBC Platelet - w/Differential (85025-CBCD) TLB-Hepatic/Liver Function Pnl (80076-HEPATIC) Specimen Handling (99000)  His updated medication list for this problem includes:    Fosinopril Sodium 10 Mg Tabs (Fosinopril sodium) ..... One half daily  Complete Medication List: 1)  Flomax 0.4 Mg Cp24 (Tamsulosin hcl) .Marland Kitchen.. 1 once daily 2)  Bayer Aspirin 325 Mg Tabs (Aspirin) .Marland Kitchen.. 1 once daily 3)  Travatan 0.004 % Soln (Travoprost) .... Use ou once daily 4)  Singulair 10 Mg Tabs (Montelukast sodium) .... Take 1 tablet by mouth once a day 5)  Proctocare-hc 2.5 % Crea (Hydrocortisone) .... Use four times daily 6)  Vytorin 10-20 Mg Tabs (Ezetimibe-simvastatin) .... One daily 7)  Promethazine Hcl 25 Mg Tabs (Promethazine hcl) .... One every  6 hours for nausea 8)  Fosinopril Sodium 10  Mg Tabs (Fosinopril sodium) .... One half daily 9)  Nexium 40 Mg Cpdr (Esomeprazole magnesium) .... One daily  Patient Instructions: 1)  Please schedule a  follow-up appointment in 6 months. 2)  Limit your Sodium (Salt) to less than 2 grams a day(slightly less than 1/2 a teaspoon) to prevent fluid retention, swelling, or worsening of symptoms. 3)  It is important that you exercise regularly at least 20 minutes 5 times a week. If you develop chest pain, have severe difficulty breathing, or feel very tired , stop exercising immediately and seek medical attention. Prescriptions: NEXIUM 40 MG CPDR (ESOMEPRAZOLE MAGNESIUM) one daily  #90 x 6   Entered and Authorized by:   Marletta Lor  MD   Signed by:   Marletta Lor  MD on 12/15/2009   Method used:   Electronically to        [facility] # [phone]* (retail)       [address]       [place], [place]  [address]       Ph: [phone]       Fax: [phone]   RxID:   [id number] FOSINOPRIL SODIUM 10 MG TABS (FOSINOPRIL SODIUM) one half daily  #90 x 6   Entered and Authorized by:   Marletta Lor  MD   Signed by:   Marletta Lor  MD on 12/15/2009   Method used:   Electronically to        [facility] # [phone]* (retail)       [address]       [place], [place]  [address]       Ph: [phone]       Fax: [phone]   RxID:   [id number] VYTORIN 10-20 MG TABS (EZETIMIBE-SIMVASTATIN) one daily  #90 x 6   Entered and Authorized by:   Marletta Lor  MD   Signed by:   Marletta Lor  MD on 12/15/2009   Method used:   Electronically to        [facility] # [phone]* (retail)       [address]       [place], [place]  [address]       Ph: [phone]       Fax: [phone]   RxID:   [id number] SINGULAIR 10 MG TABS (MONTELUKAST SODIUM) Take 1 tablet by mouth once a day  #30 x 3   Entered and Authorized by:   Marletta Lor  MD   Signed by:   Marletta Lor  MD on 12/15/2009   Method used:   Electronically to        [facility] # [phone]* (retail)       [address]       [place], [place]  [address]       Ph:  [phone]       Fax: [phone]   RxID:   [id number] FLOMAX 0.4 MG  CP24 (TAMSULOSIN HCL) 1 once daily  #90 x 6   Entered and Authorized by:   Marletta Lor  MD   Signed by:   Marletta Lor  MD on 12/15/2009   Method used:   Electronically to        [facility] # [phone]* (retail)       [address]       [place], [place]  [address]       Ph: [phone]  Fax: [phone]   RxIDVN:[id number]    Orders Added: 1)  EKG w/ Interpretation [93000] 2)  Venipuncture [id number] 3)  TLB-Lipid Panel [80061-LIPID] 4)  TLB-BMP (Basic Metabolic Panel-BMET) [id number] 5)  TLB-CBC Platelet - w/Differential [85025-CBCD] 6)  TLB-Hepatic/Liver Function Pnl [80076-HEPATIC] 7)  TLB-TSH (Thyroid Stimulating Hormone) [84443-TSH] 8)  Medicare -1st Annual Wellness Visit [id number] 9)  Est. Patient Level III [id number] 10)  Specimen Handling [id number]

## 2010-02-15 NOTE — Assessment & Plan Note (Signed)
Review of gastrointestinal problems: 1. Acute diarrheal illness, December 2008, likely infectious etiology, resolved with Flagyl antibiotics only.  Situation complicated by acute renal failure with creatinines in the mid-teens. Stool testing all negative for infection. Colonoscopy January 2009 Several polyps were removed (TAs by pathology) 2. Personal history of colonic adenomas: Colonoscopy January 2009, recall colonoscopy at one-year interval  History of Present Illness Visit Type: Follow-up Visit Primary GI MD: Owens Loffler MD Primary Provider: Bluford Kaufmann, MD Chief Complaint: diarrhea History of Present Illness:     very pleasant 77 year old man whom I last saw about a year ago.  he has had increased frequency of BMs for past 2-3 days.  Probably getting a bit worse.  Has been going 4-5 times a day.    Has not been on antibiotics for the past several months.  No overt bleeding.  Stools are not watery.  No new medicines.  No travel.  No abd pains.  He had blood tests done just prior to this visit but those results are not yet available.             Current Medications (verified): 1)  Flomax 0.4 Mg  Cp24 (Tamsulosin Hcl) .Marland Kitchen.. 1 Once Daily 2)  Bayer Aspirin 325 Mg  Tabs (Aspirin) .Marland Kitchen.. 1 Once Daily 3)  Niacin Cr 500 Mg  Cpcr (Niacin) .Marland Kitchen.. 1 Two Times A Day 4)  Monopril 20 Mg  Tabs (Fosinopril Sodium) .... One Daily  Allergies (verified): No Known Drug Allergies  Vital Signs:  Patient profile:   77 year old male Height:      64.5 inches Weight:      166 pounds Pulse rate:   56 / minute Pulse rhythm:   regular BP sitting:   140 / 56  (right arm) Cuff size:   regular  Physical Exam  Additional Exam:  Constitutional: generally well appearing Psychiatric: alert and oriented times 3 Abdomen: soft, non-tender, non-distended, normal bowel sounds    Impression & Recommendations:  Problem # 1:  Acute diarrhea he does not appear to be dehydrated. He had labs checked  just prior to this visit but they are not yet available. He knows to push fluids. He'll begin taking Imodium once daily. I do suspect that he has an infectious etiology. He will get stool testing done today. He is due for colonoscopy again for polyp surveillance and we will arrange that be done in the next 2-3 weeks.  Other Orders: T-Culture, C-Diff Toxin A/B [phone]) T-Culture, Stool ([id number]) T-Stool Giardia / Crypto- EIA (60454) T-Fecal WBC [id number])  Patient Instructions: 1)  You will be scheduled to have a colonoscopy for polyp surveillance (2-3 weeks from now). 2)  Try one immodium a day for the next several days.  Only stop this if you get constipated.  Drink extra fluids for next few days. 3)  You will get lab test(s) done today (stool tests). 4)  A copy of this information will be sent to Dr. Burnice Logan. 5)  The medication list was reviewed and reconciled.  All changed / newly prescribed medications were explained.  A complete medication list was provided to the patient / caregiver.  Appended Document: Orders Update/movi    Clinical Lists Changes  Problems: Added new problem of DIARRHEA (ICD-787.91) Medications: Added new medication of MOVIPREP 100 GM  SOLR (PEG-KCL-NACL-NASULF-NA ASC-C) As per prep instructions. - Signed Rx of MOVIPREP 100 GM  SOLR (PEG-KCL-NACL-NASULF-NA ASC-C) As per prep instructions.;  #1 x 0;  Signed;  Entered by: Chong Sicilian  Lewis CMA;  Authorized by: Milus Banister MD;  Method used: Electronically to [facility] # [phone]*, [address], [place], [place]  [address], Ph: [phone], Fax: [phone] Orders: Added new Test order of Colonoscopy (Colon) - Signed    Prescriptions: MOVIPREP 100 GM  SOLR (PEG-KCL-NACL-NASULF-NA ASC-C) As per prep instructions.  #1 x 0   Entered by:   Christian Mate CMA   Authorized by:   Milus Banister MD   Signed by:   Christian Mate CMA on 06/09/2008   Method used:   Electronically to        [facility] # [phone]* (retail)       [address]       [place], [place]  [address]       Ph: [phone]       Fax: [phone]   RxID:   [id number]

## 2010-02-15 NOTE — Progress Notes (Signed)
Summary: [facility]?  Phone Note From Pharmacy   Caller: [facility] # [phone] Call For: kwia  Summary of Call: Can we use the [facility] instead of [facility], it does not come up in our system and the [facility] HC 2.5 does? Initial call taken by: Shelbie Hutching, RN,  February 15, 2009 3:16 PM  Follow-up for Phone Call        OK Follow-up by: Marletta Lor  MD,  February 15, 2009 4:20 PM  Additional Follow-up for Phone Call Additional follow up Details #1::        Left message to give OK. Additional Follow-up by: Shelbie Hutching, RN,  February 15, 2009 4:23 PM

## 2010-02-15 NOTE — Miscellaneous (Signed)
Summary: 56 Orders/[facility]  22 Orders/[facility]   Imported By: Laural Benes 11/11/2009 09:57:11  _____________________________________________________________________  External Attachment:    Type:   Image     Comment:   External Document

## 2010-02-15 NOTE — Assessment & Plan Note (Signed)
Summary: acute/mhf   Vital Signs:  Patient Profile:   77 Years Old Male Height:     64.5 inches Weight:      151 pounds Temp:     97.9 degrees F oral BP sitting:   150 / 84  (left arm)  Vitals Entered By: Chipper Oman, RN (December 17, 2006 11:09 AM)                 Chief Complaint:  Has had diarrhea and nausea all week, not eating. Now has dysuria, and frequency..  History of Present Illness: 77 year old male, who was seen about one week ago with suspected viral gastroenteritis.  For the past two days.  His stools have normalized, but he is still anorexic with poor oral intake. he has also developed some urinary urgency and dysuria.  Current Allergies: No known allergies       Physical Exam  General:     Well-developed,well-nourished,in no acute distress; alert,appropriate and cooperative throughout examination  blood pressure 100/70 Mouth:     Oral mucosa and oropharynx without lesions or exudates.  Teeth in good repair.  appeared well-hydrated Neck:     No deformities, masses, or tenderness noted. Lungs:     Normal respiratory effort, chest expands symmetrically. Lungs are clear to auscultation, no crackles or wheezes. Heart:     Normal rate and regular rhythm. S1 and S2 normal without gallop, murmur, click, rub or other extra sounds.   Abdomen:     Bowel sounds positive,abdomen soft and non-tender without masses, organomegaly or hernias noted.    Impression & Recommendations:  Problem # 1:  DYSURIA (ICD-788.1)  Orders: UA Dipstick w/o Micro (81002) Venipuncture [id number]) TLB-CBC Platelet - w/Differential (85025-CBCD) TLB-BMP (Basic Metabolic Panel-BMET) (99991111) TLB-Hepatic/Liver Function Pnl (80076-HEPATIC)  His updated medication list for this problem includes:    Ciprofloxacin Hcl 500 Mg Tabs (Ciprofloxacin hcl) ..... One twice daily   Problem # 2:  GASTROENTERITIS (ICD-558.9)  Orders: Venipuncture [id number]) TLB-CBC Platelet -  w/Differential (85025-CBCD) TLB-BMP (Basic Metabolic Panel-BMET) (99991111) TLB-Hepatic/Liver Function Pnl (80076-HEPATIC)   Complete Medication List: 1)  Allopurinol 300 Mg Tabs (Allopurinol) .Marland Kitchen.. 1 once daily 2)  Clarinex 5 Mg Tabs (Desloratadine) .Marland Kitchen.. 1 once daily 3)  Monopril 20 Mg Tabs (Fosinopril sodium) .Marland Kitchen.. 1 once daily 4)  Aspirin 325 Mg Tabs (Aspirin) .Marland Kitchen.. 1 once daily 5)  Slo-niacin 500 Mg Tbcr (Niacin) 6)  Vytorin 10-20 Mg Tabs (Ezetimibe-simvastatin) .Marland Kitchen.. 1 once daily 7)  Hyoscyamine Sulfate 0.125 Mg Tabs (Hyoscyamine sulfate) .... One every 6 hrs for pain or diarrhea 8)  Ciprofloxacin Hcl 500 Mg Tabs (Ciprofloxacin hcl) .... One twice daily   Patient Instructions: 1)  Please schedule a follow-up appointment in 3 months. 2)  Drink as much fluid as you can tolerate for the next few days. 3)  Alighn  one daily 4)  Please schedule a follow-up appointment as needed.    Prescriptions: CIPROFLOXACIN HCL 500 MG  TABS (CIPROFLOXACIN HCL) one twice daily  #14 x 0   Entered and Authorized by:   Marletta Lor  MD   Signed by:   Marletta Lor  MD on 12/17/2006   Method used:   Print then Give to Patient   RxID:   [id number]  and] Laboratory Results   Urine Tests  Date/Time Recieved: 12/17/06  Routine Urinalysis   Color: yellow Appearance: Cloudy Glucose: negative   (Normal Range: Negative) Bilirubin: negative   (Normal Range: Negative) Ketone: smal (15)   (  Normal Range: Negative) Spec. Gravity: >=1.030   (Normal Range: 1.003-1.035) Blood: large   (Normal Range: Negative) pH: 6.0   (Normal Range: 5.0-8.0) Protein: >=300   (Normal Range: Negative) Urobilinogen: 1.0   (Normal Range: 0-1) Nitrite: negative   (Normal Range: Negative) Leukocyte Esterace: moderate   (Normal Range: Negative)

## 2010-02-15 NOTE — Procedures (Signed)
Summary: COLONOSCOPY    Colonoscopy  Procedure date:  02/01/2007  Findings:      8 COLON POLYPS RANGING FROM 2MM UP TO 15MM IN SIZE.  NO CANCERS.  + DIVERTICULOSIS.  IF THE POLYPS ARE TUBULAR ADENOMAS (PRECANCEROUS POLYP), THE PATIENT WILL NEED A REPEAT COLONOSCOPY IN 1 YEAR GIVEN THE LARGE NUMBER.

## 2010-02-15 NOTE — Consult Note (Signed)
Summary: Dr Karsten Ro note  Dr Karsten Ro note   Imported By: Jamelle Haring 06/06/2007 10:02:25  _____________________________________________________________________  External Attachment:    Type:   Image     Comment:   Dr Karsten Ro note

## 2010-02-15 NOTE — Assessment & Plan Note (Signed)
Summary: M6A/FU/RCD   Vital Signs:  Patient Profile:   77 Years Old Male Height:     64.5 inches Weight:      154 pounds Temp:     97.9 degrees F oral BP sitting:   140 / 70  (left arm)  Vitals Entered By: Chipper Oman, RN (February 11, 2007 8:20 AM)                 Chief Complaint:  ROV and feeling ok. Had colonoscopy and prostate biopsy.Marland Kitchen  History of Present Illness: 77 year old patient seen today for follow-up.  He's had a recent colonoscopy and urological evaluation.  He has either had a prostate or the bladder, biopsy late last week.  His house last last month for acute renal failure in the setting of an acute diarrheal dehydrating illness.  At the present time he is stable and renal function studies are back to baseline.  He feels well today.  He is off his by Vytorin, and slow niacin  Current Allergies: No known allergies   Past Medical History:    Prostate cancer, hx of    Depression    Gout    Hypertension    Borderline glaucoma    mild renal insufficiency    history of acute renal failure      Physical Exam  General:     Well-developed,well-nourished,in no acute distress; alert,appropriate and cooperative throughout examination  blood pressure 138/70 Head:     Normocephalic and atraumatic without obvious abnormalities. No apparent alopecia or balding. Eyes:     No corneal or conjunctival inflammation noted. EOMI. Perrla. Funduscopic exam benign, without hemorrhages, exudates or papilledema. Vision grossly normal. Mouth:     Oral mucosa and oropharynx without lesions or exudates.  Teeth in good repair. Neck:     No deformities, masses, or tenderness noted. Lungs:     Normal respiratory effort, chest expands symmetrically. Lungs are clear to auscultation, no crackles or wheezes. Heart:     Normal rate and regular rhythm. S1 and S2 normal without gallop, murmur, click, rub or other extra sounds. Abdomen:     Bowel sounds positive,abdomen soft and  non-tender without masses, organomegaly or hernias noted. Msk:     No deformity or scoliosis noted of thoracic or lumbar spine.   Pulses:     R and L carotid,radial,femoral,dorsalis pedis and posterior tibial pulses are full and equal bilaterally Extremities:     No clubbing, cyanosis, edema, or deformity noted with normal full range of motion of all joints.      Impression & Recommendations:  Problem # 1:  HYPERTENSION (ICD-401.9)  The following medications were removed from the medication list:    Monopril 20 Mg Tabs (Fosinopril sodium) .Marland Kitchen... 1 once daily   Problem # 2:  HYPERCHOLESTEROLEMIA (ICD-272.0)  The following medications were removed from the medication list:    Slo-niacin 500 Mg Tbcr (Niacin)    Vytorin 10-20 Mg Tabs (Ezetimibe-simvastatin) .Marland Kitchen... 1 once daily  Orders: Venipuncture HR:[id number]) TLB-Lipid Panel (80061-LIPID)   Complete Medication List: 1)  Hyoscyamine Sulfate 0.125 Mg Tabs (Hyoscyamine sulfate) .... One every 6 hrs for pain or diarrhea 2)  Flomax 0.4 Mg Cp24 (Tamsulosin hcl) .Marland Kitchen.. 1 once daily 3)  Bayer Aspirin 325 Mg Tabs (Aspirin)   Patient Instructions: 1)  Please schedule a follow-up appointment in 3 months. 2)  Limit your Sodium (Salt) to less than 2 grams a day(slightly less than 1/2 a teaspoon) to prevent fluid retention, swelling, or  worsening of symptoms. 3)  It is important that you exercise regularly at least 20 minutes 5 times a week. If you develop chest pain, have severe difficulty breathing, or feel very tired , stop exercising immediately and seek medical attention.    ]

## 2010-02-15 NOTE — Miscellaneous (Signed)
  Medications Added ALLOPURINOL 300 MG TABS (ALLOPURINOL)  CLARINEX 5 MG TABS (DESLORATADINE)  MONOPRIL 20 MG TABS (FOSINOPRIL SODIUM)       Allergies Added: NKDA Clinical Lists Changes  Medications: Added new medication of ALLOPURINOL 300 MG TABS (ALLOPURINOL) Added new medication of CLARINEX 5 MG TABS (DESLORATADINE) Added new medication of MONOPRIL 20 MG TABS (FOSINOPRIL SODIUM) Observations: Added new observation of NKA: T (08/08/2006 12:38) Added new observation of LLIMPORTMEDS: completed (08/08/2006 12:38)

## 2010-02-15 NOTE — Assessment & Plan Note (Signed)
Summary: VOMITING LAST 48HR/NJR   Vital Signs:  Patient profile:   77 year old male Weight:      135 pounds BP sitting:   122 / 70  (right arm) Cuff size:   regular  Vitals Entered By: Cay Schillings LPN (September 12, 624THL 3:18 PM) CC: c/o vomiting last 2 nights Is Patient Diabetic? No  Flu Vaccine Consent Questions     Do you have a history of severe allergic reactions to this vaccine? no    Any prior history of allergic reactions to egg and/or gelatin? no    Do you have a sensitivity to the preservative Thimersol? no    Do you have a past history of Guillan-Barre Syndrome? no    Do you currently have an acute febrile illness? no    Have you ever had a severe reaction to latex? no    Vaccine information given and explained to patient? yes    Are you currently pregnant? no    Lot Number:[id number]   Exp Date:07/16/2010   Site Given  Left Deltoid IM  Primary Care Provider:  Bluford Kaufmann, MD  CC:  c/o vomiting last 2 nights.  History of Present Illness: 77 year old patient who is seen today with the chief complaint of vomiting.  He has had one episode each over the last 3 days.  The first two episodes happened promptly after attempting to enjoy a bowl of soup.  This morning.  He had a mild episode of vomiting.  Fasting prior to breakfast.  Denies any abdominal pain.  States she has not had a bowel movement in several days.  approximately, 15 months ago.  He was hospitalized for acute renal failure in the setting of an acute diarrheal illness.  His oral intake has done well  Allergies (verified): No Known Drug Allergies  Past History:  Past Medical History: Reviewed history from 08/16/2009 and no changes required. Prostate cancer, hx of Depression Gout Hypertension  glaucoma mild renal insufficiency history of acute renal failure  Past Surgical History: Reviewed history from 06/15/2009 and no changes required. Carotid endarterectomy diagnosed with prostate  cancer in 2002.  He was Gleason 6 involve the right middle lobe Cataract extraction right eye 2009  colonoscopy June 2010 carotid artery Doppler ultrasound, April 2010, 5-11  Review of Systems       denies any anorexia, abdominal pain, abdominal distention  Physical Exam  General:  Well-developed,well-nourished,in no acute distress; alert,appropriate and cooperative throughout examination; 110/64 Head:  Normocephalic and atraumatic without obvious abnormalities. No apparent alopecia or balding. Eyes:  No corneal or conjunctival inflammation noted. EOMI. Perrla. Funduscopic exam benign, without hemorrhages, exudates or papilledema. Vision grossly normal. Ears:  External ear exam shows no significant lesions or deformities.  Otoscopic examination reveals clear canals, tympanic membranes are intact bilaterally without bulging, retraction, inflammation or discharge. Hearing is grossly normal bilaterally. Mouth:  Oral mucosa and oropharynx without lesions or exudates.  appears well hydrated, and Neck:  No deformities, masses, or tenderness noted. Chest Wall:  No deformities, masses, tenderness or gynecomastia noted. Lungs:  Normal respiratory effort, chest expands symmetrically. Lungs are clear to auscultation, no crackles or wheezes.\par O2 saturation 95 Heart:  Normal rate and regular rhythm. S1 and S2 normal without gallop, murmur, click, rub or other extra sounds.  the tachycardia Abdomen:  soft and nontender.  No distention.  Bowel sounds normal.  No masses noted   Impression & Recommendations:  Problem # 1:  VOMITING (ICD-787.03)  Problem # 2:  WEIGHT LOSS (ICD-783.21)  Complete Medication List: 1)  Flomax 0.4 Mg Cp24 (Tamsulosin hcl) .Marland Kitchen.. 1 once daily 2)  Bayer Aspirin 325 Mg Tabs (Aspirin) .Marland Kitchen.. 1 once daily 3)  Travatan 0.004 % Soln (Travoprost) .... Use ou once daily 4)  Singulair 10 Mg Tabs (Montelukast sodium) .... Take 1 tablet by mouth once a day 5)  Proctocare-hc 2.5 %  Crea (Hydrocortisone) .... Use four times daily 6)  Vytorin 10-20 Mg Tabs (Ezetimibe-simvastatin) .... One daily 7)  Promethazine Hcl 25 Mg Tabs (Promethazine hcl) .... One every  6 hours for nausea 8)  Fosinopril Sodium 10 Mg Tabs (Fosinopril sodium) .... One daily 9)  Nexium 40 Mg Cpdr (Esomeprazole magnesium) .... One daily  Other Orders: Flu Vaccine 79yrs + MEDICARE PATIENTS [id number]) Administration Flu vaccine - MCR [id number])  Patient Instructions: 1)  Drink clear liquids only for the next 24 hours, then slowly add other liquids and food as you  tolerate them. 2)  Nexium one capsule daily 3)  Call if symptoms fail to improve or any clinical worsening Prescriptions: PROMETHAZINE HCL 25 MG TABS (PROMETHAZINE HCL) one every  6 hours for nausea  #30 x 2   Entered and Authorized by:   Marletta Lor  MD   Signed by:   Marletta Lor  MD on 09/27/2009   Method used:   Electronically to        [facility] [address] # [phone]* (retail)       [address]       [place], [place]  [address]       Ph: [phone]       Fax: [phone]   RxID:   [id number]

## 2010-02-15 NOTE — Miscellaneous (Signed)
Summary: Certification and Plan of Care/[facility]  Certification and Plan of Care/[facility]   Imported By: Laural Benes 12/21/2009 13:08:56  _____________________________________________________________________  External Attachment:    Type:   Image     Comment:   External Document

## 2010-02-15 NOTE — Progress Notes (Signed)
   Phone Note Call from Patient   Summary of Call: Pt phoned office because he had eaten breakfast today and has a procedure tomorrow.  Writer reminded pt he needs to be on a clear liquid diet t/o rest of day, follow prep instructions and push a lot of fluids.  Pt voiced understanding Initial call taken by: Randall Hiss RN,  June 22, 2008 3:28 PM

## 2010-02-15 NOTE — Progress Notes (Signed)
Summary: diarrhea   Phone Note Call from Patient Call back at Home Phone [phone]   Caller: Spouse Call For: Alexander Mcdonald  Summary of Call: pt is going into dehydration probs Dr Inda Merlin is away wondering if Dr Ardis Hughs can see him today   Initial call taken by: Quenton Fetter [facility],  Jun 09, 2008 12:52 PM  Follow-up for Phone Call        Patient is having diarrhea x 2 days wife feels he is getting dehydrated.  patient is very [place] will not take his temp.  Was hospitalized in the past.  Wife would like patient to be seen.  has gone 4 times today.  Patient to be seen today at 315, will go to lab first cbc and bmet Follow-up by: Georgianne Fick, RN,  Jun 09, 2008 2:13 PM

## 2010-02-15 NOTE — Assessment & Plan Note (Signed)
Summary: ear irrigation/dm   Vital Signs:  Patient Profile:   77 Years Old Male Height:     64.5 inches Weight:      153 pounds Temp:     97.6 degrees F oral BP sitting:   130 / 56  (left arm)  Vitals Entered By: Chipper Oman, RN (March 27, 2007 9:31 AM)                 Chief Complaint:  C/o L ear wax..  History of Present Illness: 77 year old patient history of hypertension, dyslipidemia ;complain of decrease in auditory acuity from the left ear    Current Allergies: No known allergies       Physical Exam  General:     Well-developed,well-nourished,in no acute distress; alert,appropriate and cooperative throughout examination   Head:     Normocephalic and atraumatic without obvious abnormalities. No apparent alopecia or balding. Eyes:     No corneal or conjunctival inflammation noted. EOMI. Perrla. Funduscopic exam benign, without hemorrhages, exudates or papilledema. Vision grossly normal. Ears:     the right ear and canal canal unremarkable.  A left cerumen impaction noted    Impression & Recommendations:  Problem # 1:  CERUMEN IMPACTION, LEFT (ICD-380.4)  Orders: Cerumen Impaction Removal [id number])   Problem # 2:  HYPERCHOLESTEROLEMIA (ICD-272.0)  Problem # 3:  HYPERTENSION (ICD-401.9)  Complete Medication List: 1)  Hyoscyamine Sulfate 0.125 Mg Tabs (Hyoscyamine sulfate) .... One every 6 hrs for pain or diarrhea 2)  Flomax 0.4 Mg Cp24 (Tamsulosin hcl) .Marland Kitchen.. 1 once daily 3)  Bayer Aspirin 325 Mg Tabs (Aspirin)   Patient Instructions: 1)  Please schedule a follow-up appointment as needed.    ]

## 2010-02-15 NOTE — Assessment & Plan Note (Signed)
Summary: 2 wk rov/njr   Vital Signs:  Patient profile:   77 year old male Weight:      140 pounds Temp:     97.5 degrees F oral BP sitting:   126 / 80  (left arm) Cuff size:   regular  Vitals Entered By: Cay Schillings LPN (August 17, 624THL 9:18 AM) CC: 2 wk rov - doing better Is Patient Diabetic? No   Primary Care Provider:  Bluford Kaufmann, MD  CC:  2 wk rov - doing better.  History of Present Illness: 77 year old patient who is seen today for follow-up of hypertension.  He has recently seen by ophthalmology, and Diamox has been discontinued.  His nausea has also larger resolved.  He has hypertension and mild renal insufficiency.  Monopril has been held.  Laboratory studies were done two weeks ago and revealed stable azotemia.  Sedimentation rate was normal.  He does have a history of depression.  Allergies (verified): No Known Drug Allergies  Past History:  Past Medical History: Reviewed history from 08/16/2009 and no changes required. Prostate cancer, hx of Depression Gout Hypertension  glaucoma mild renal insufficiency history of acute renal failure  Review of Systems       The patient complains of weight loss.    Physical Exam  General:  Well-developed,well-nourished,in no acute distress; alert,appropriate and cooperative throughout examination; 140/80   Impression & Recommendations:  Problem # 1:  HYPERTENSION (ICD-401.9)  The following medications were removed from the medication list:    Monopril 20 Mg Tabs (Fosinopril sodium) ..... One daily His updated medication list for this problem includes:    Acetazolamide 250 Mg Tabs (Acetazolamide) ..... One daily    Fosinopril Sodium 10 Mg Tabs (Fosinopril sodium) ..... One daily  Problem # 2:  WEIGHT LOSS (ICD-783.21)  Complete Medication List: 1)  Flomax 0.4 Mg Cp24 (Tamsulosin hcl) .Marland Kitchen.. 1 once daily 2)  Bayer Aspirin 325 Mg Tabs (Aspirin) .Marland Kitchen.. 1 once daily 3)  Travatan 0.004 % Soln (Travoprost)  .... Use ou once daily 4)  Singulair 10 Mg Tabs (Montelukast sodium) .... Take 1 tablet by mouth once a day 5)  Proctocare-hc 2.5 % Crea (Hydrocortisone) .... Use four times daily 6)  Vytorin 10-20 Mg Tabs (Ezetimibe-simvastatin) .... One daily 7)  Acetazolamide 250 Mg Tabs (Acetazolamide) .... One daily 8)  Promethazine Hcl 25 Mg Tabs (Promethazine hcl) .... One every  6 hours for nausea 9)  Fosinopril Sodium 10 Mg Tabs (Fosinopril sodium) .... One daily  Patient Instructions: 1)  Please schedule a follow-up appointment in 2 months. 2)  Limit your Sodium (Salt). 3)  It is important that you exercise regularly at least 20 minutes 5 times a week. If you develop chest pain, have severe difficulty breathing, or feel very tired , stop exercising immediately and seek medical attention. Prescriptions: FOSINOPRIL SODIUM 10 MG TABS (FOSINOPRIL SODIUM) one daily  #90 x 4   Entered and Authorized by:   Marletta Lor  MD   Signed by:   Marletta Lor  MD on 09/01/2009   Method used:   Electronically to        [facility] [address] # [phone]* (retail)       [address]       [place], [place]  [address]       Ph: [phone]       Fax: [phone]   RxID:   [id number]

## 2010-02-15 NOTE — Miscellaneous (Signed)
Summary: 6 Orders/[facility]  57 Orders/[facility]   Imported By: Laural Benes 12/15/2009 14:10:00  _____________________________________________________________________  External Attachment:    Type:   Image     Comment:   External Document

## 2010-02-15 NOTE — Progress Notes (Signed)
Summary: lab results  Phone Note Call from Patient Call back at [phone]   Call For: kwia Summary of Call: Called for lab report that he hasn't heard from.  Gave him #s.  He has Vytorin on hand from before.  Stopped it 12-7 when he was in hospital.  Should he restart it or what?  [place].  No problems with the Vytorin that he knows of.  Requested Dr. Raliegh Ip call him. Initial call taken by: Shelbie Hutching, RN,  February 18, 2007 1:57 PM  Follow-up for Phone Call        per Dr. Raliegh Ip he can stay off Vytorin for now. Follow-up by: Chipper Oman, RN,  February 18, 2007 5:18 PM    he has been off of it

## 2010-02-15 NOTE — Letter (Signed)
Summary: [facility]   Imported By: Phillis Knack 09/24/2009 14:29:33  _____________________________________________________________________  External Attachment:    Type:   Image     Comment:   External Document

## 2010-02-15 NOTE — Assessment & Plan Note (Signed)
Summary: severe pain in rectum/cjr   Vital Signs:  Patient profile:   77 year old male Weight:      163 pounds Temp:     97.5 degrees F oral Pulse rate:   72 / minute Pulse rhythm:   regular BP sitting:   142 / 76  (left arm) Cuff size:   regular  Vitals Entered By: Chipper Oman, RN (February 15, 2009 1:03 PM) CC: C/o rectal pain- hard to sit.   Primary Care Provider:  Bluford Kaufmann, MD  CC:  C/o rectal pain- hard to sit.Marland Kitchen  History of Present Illness: an 77 year old patient with a 3 day history of painful, nonbleeding hemorrhoids.  He has been using topical medications without much benefit.  He is quite uncomfortable to the weekend.  He has a history of hypertension and remote history of prostate cancer.  He has treated dyslipidemia.  Allergies: No Known Drug Allergies  Past History:  Past Medical History: Reviewed history from 02/11/2007 and no changes required. Prostate cancer, hx of Depression Gout Hypertension Borderline glaucoma mild renal insufficiency history of acute renal failure  Physical Exam  General:  Well-developed,well-nourished,in no acute distress; alert,appropriate and cooperative throughout examination Abdomen:  Bowel sounds positive,abdomen soft and non-tender without masses, organomegaly or hernias noted. Rectal:  thrombosed external hemorrhoid   Impression & Recommendations:  Problem # 1:  HEMORRHOID, THROMBOSED (ICD-455.7)  Orders: Prescription Created Electronically [phone]) Surgical Referral (Surgery)  Problem # 2:  HYPERTENSION (ICD-401.9)  His updated medication list for this problem includes:    Monopril 20 Mg Tabs (Fosinopril sodium) ..... One daily  Complete Medication List: 1)  Flomax 0.4 Mg Cp24 (Tamsulosin hcl) .Marland Kitchen.. 1 once daily 2)  Bayer Aspirin 325 Mg Tabs (Aspirin) .Marland Kitchen.. 1 once daily 3)  Niacin Cr 500 Mg Cpcr (Niacin) .Marland Kitchen.. 1 two times a day 4)  Monopril 20 Mg Tabs (Fosinopril sodium) .... One daily 5)  Travatan  0.004 % Soln (Travoprost) .... Use ou once daily 6)  Singulair 10 Mg Tabs (Montelukast sodium) .... Take 1 tablet by mouth once a day 7)  Proctocare-hc 2.5 % Crea (Hydrocortisone) .... Use four times daily  Patient Instructions: 1)  general surgical f/u Prescriptions: PROCTOCARE-HC 2.5 % CREA (HYDROCORTISONE) use four times daily  #60 gms x 2   Entered and Authorized by:   Marletta Lor  MD   Signed by:   Marletta Lor  MD on 02/15/2009   Method used:   Print then Give to Patient   RxID:   [id number] PROCTOCARE-HC 2.5 % CREA (HYDROCORTISONE) use four times daily  #60 gms x 2   Entered and Authorized by:   Marletta Lor  MD   Signed by:   Marletta Lor  MD on 02/15/2009   Method used:   Electronically to        [facility] [address] # [phone]* (retail)       [address]       [place], [place]  [address]       Ph: [phone]       Fax: [phone]   [place]:   [id number]

## 2010-02-15 NOTE — Assessment & Plan Note (Signed)
Summary: HTN CONCERNS // RS   Vital Signs:  Patient profile:   77 year old male Weight:      146 pounds Temp:     98.1 degrees F oral BP sitting:   140 / 70  (right arm) Cuff size:   regular  Vitals Entered By: Cay Schillings LPN (October 17, 624THL 4:25 PM) CC: BP up and down - 164/124 yesterday , has eye surg. tomorrow Is Patient Diabetic? No   Primary Care Provider:  Bluford Kaufmann, MD  CC:  BP up and down - 164/124 yesterday  and has eye surg. tomorrow.  History of Present Illness:  77 year old patient who is seen today for follow-up.  He he has a history of hypertension and presently has been off antihypertensive medication.  He has a history of acute renal failure in the setting of lung depletion.  He has recently been on Diamox for glaucoma, but this has been discontinued.  His Monopril has been on hold after a recent acute diarrheal illness yesterday.  Blood pressure is noted to be elevated.  He is planned to have eye surgery tomorrow.  Allergies (verified): No Known Drug Allergies  Past History:  Past Medical History: Reviewed history from 08/16/2009 and no changes required. Prostate cancer, hx of Depression Gout Hypertension  glaucoma mild renal insufficiency history of acute renal failure  Physical Exam  General:  Well-developed,well-nourished,in no acute distress; alert,appropriate and cooperative throughout examination; 160/72 Neck:  No deformities, masses, or tenderness noted. Lungs:  Normal respiratory effort, chest expands symmetrically. Lungs are clear to auscultation, no crackles or wheezes. Heart:  Normal rate and regular rhythm. S1 and S2 normal without gallop, murmur, click, rub or other extra sounds.   Impression & Recommendations:  Problem # 1:  HYPERTENSION (ICD-401.9)  His updated medication list for this problem includes:    Fosinopril Sodium 10 Mg Tabs (Fosinopril sodium) ..... One half daily  His updated medication list for this  problem includes:    Fosinopril Sodium 10 Mg Tabs (Fosinopril sodium) ..... One half daily  Problem # 2:  HYPERCHOLESTEROLEMIA (ICD-272.0)  His updated medication list for this problem includes:    Vytorin 10-20 Mg Tabs (Ezetimibe-simvastatin) ..... One daily  His updated medication list for this problem includes:    Vytorin 10-20 Mg Tabs (Ezetimibe-simvastatin) ..... One daily  Complete Medication List: 1)  Flomax 0.4 Mg Cp24 (Tamsulosin hcl) .Marland Kitchen.. 1 once daily 2)  Bayer Aspirin 325 Mg Tabs (Aspirin) .Marland Kitchen.. 1 once daily 3)  Travatan 0.004 % Soln (Travoprost) .... Use ou once daily 4)  Singulair 10 Mg Tabs (Montelukast sodium) .... Take 1 tablet by mouth once a day 5)  Proctocare-hc 2.5 % Crea (Hydrocortisone) .... Use four times daily 6)  Vytorin 10-20 Mg Tabs (Ezetimibe-simvastatin) .... One daily 7)  Promethazine Hcl 25 Mg Tabs (Promethazine hcl) .... One every  6 hours for nausea 8)  Fosinopril Sodium 10 Mg Tabs (Fosinopril sodium) .... One half daily 9)  Nexium 40 Mg Cpdr (Esomeprazole magnesium) .... One daily  Patient Instructions: 1)  Please schedule a follow-up appointment in 3 months. 2)  Limit your Sodium (Salt). 3)  It is important that you exercise regularly at least 20 minutes 5 times a week. If you develop chest pain, have severe difficulty breathing, or feel very tired , stop exercising immediately and seek medical attention. 4)  Check your Blood Pressure regularly. If it is above: 150/90 you should make an appointment.   Orders Added: 1)  Est.  Patient Level III [id number]

## 2010-02-15 NOTE — Progress Notes (Signed)
Summary: ref rosacea  Phone Note Call from Patient   Caller: [phone] Call For: kwia Summary of Call: Eye doctor says Alexander Mcdonald has rosacea & should see dermatologist.  Please refer.  Order sent to [place]  September 29, 2008 12:19 PM  Initial call taken by: Shelbie Hutching, RN,  September 28, 2008 1:02 PM  Follow-up for Phone Call        refer Dermatology Follow-up by: Marletta Lor  MD,  September 28, 2008 6:06 PM  Additional Follow-up for Phone Call Additional follow up Details #1::        Per pt ... cancel appt Additional Follow-up by: Carlene Coria,  September 30, 2008 10:43 AM  New Problems: ROSACEA (ICD-695.3)   New Problems: ROSACEA (ICD-695.3)

## 2010-02-15 NOTE — Assessment & Plan Note (Signed)
Summary: BP CHECK/RCD   Vital Signs:  Patient profile:   77 year old male BP sitting:   122 / 68  (right arm) Cuff size:   regular  Vitals Entered By: Westley Hummer CMA Deborra Medina) (July 09, 2009 8:30 AM) CC: elevated blood pressure   Primary Care Provider:  Bluford Kaufmann, MD  CC:  elevated blood pressure.  History of Present Illness: Alexander Mcdonald is a 77 year old, married male, who comes in today accompanied by his wife and daughter for evaluation of hypertension.  Yesterday he was at the ophthalmologist office.  They checked a routine BP it was 190 over hundred.  The ophthalmologist called me.  We gave him an extra Monopril last night and two, Monopril.  This morning.  Today, his BP is 100/60.  He is not lightheaded.  So the question arises did the extra Monopril.  Laura's blood pressure or was the blood pressure reading at the ophthalmologist office.  Accurate.  Allergies: No Known Drug Allergies  Review of Systems      See HPI  Physical Exam  General:  Well-developed,well-nourished,in no acute distress; alert,appropriate and cooperative throughout examination Heart:  100/60 right arm sitting position   Impression & Recommendations:  Problem # 1:  HYPERTENSION (ICD-401.9) Assessment Improved  His updated medication list for this problem includes:    Monopril 20 Mg Tabs (Fosinopril sodium) ..... One daily  Complete Medication List: 1)  Flomax 0.4 Mg Cp24 (Tamsulosin hcl) .Marland Kitchen.. 1 once daily 2)  Bayer Aspirin 325 Mg Tabs (Aspirin) .Marland Kitchen.. 1 once daily 3)  Niacin Cr 500 Mg Cpcr (Niacin) .Marland Kitchen.. 1 two times a day 4)  Monopril 20 Mg Tabs (Fosinopril sodium) .... One daily 5)  Travatan 0.004 % Soln (Travoprost) .... Use ou once daily 6)  Singulair 10 Mg Tabs (Montelukast sodium) .... Take 1 tablet by mouth once a day 7)  Proctocare-hc 2.5 % Crea (Hydrocortisone) .... Use four times daily 8)  Vytorin 10-20 Mg Tabs (Ezetimibe-simvastatin) .... One daily  Patient Instructions: 1)   take one Monopril tablet daily, starting tomorrow morning.  Check your blood pressure twice daily.  Return to see Dr. Raliegh Ip. Monday afternoon for follow-up

## 2010-02-15 NOTE — Assessment & Plan Note (Signed)
Summary: 3 month roa/jls   Vital Signs:  Patient Profile:   77 Years Old Male Height:     64.5 inches Weight:      158 pounds Temp:     97.6 degrees F oral BP sitting:   168 / 80  (left arm) Cuff size:   regular  Vitals Entered By: Chipper Oman, RN (May 14, 2007 8:27 AM)                 Chief Complaint:  ROV.Marland Kitchen  History of Present Illness: 77 year old patient seen today for follow-up.  He has a history of hypertension, but has been off blood pressure medication since admitted to the hospital in December of last year for acute renal failure in the setting of severe volume depletion.  He feels well.  He has had a recent carotid artery ultrasound.  He is status post left CEA in the past.  He is followed closely by urology for prostate cancer    Current Allergies: No known allergies   Past Medical History:    Reviewed history from 02/11/2007 and no changes required:       Prostate cancer, hx of       Depression       Gout       Hypertension       Borderline glaucoma       mild renal insufficiency       history of acute renal failure     Review of Systems  The patient denies anorexia, fever, weight loss, weight gain, vision loss, decreased hearing, hoarseness, chest pain, syncope, dyspnea on exhertion, peripheral edema, prolonged cough, hemoptysis, abdominal pain, melena, hematochezia, severe indigestion/heartburn, hematuria, incontinence, genital sores, muscle weakness, suspicious skin lesions, transient blindness, difficulty walking, depression, unusual weight change, abnormal bleeding, enlarged lymph nodes, angioedema, breast masses, and testicular masses.     Physical Exam  General:     Well-developed,well-nourished,in no acute distress; alert,appropriate and cooperative throughout examination; blood pressure 160/80 Head:     Normocephalic and atraumatic without obvious abnormalities. No apparent alopecia or balding. Mouth:     Oral mucosa and oropharynx  without lesions or exudates.  Teeth in good repair. Neck:     No deformities, masses, or tenderness noted. Chest Wall:     No deformities, masses, tenderness or gynecomastia noted. Lungs:     Normal respiratory effort, chest expands symmetrically. Lungs are clear to auscultation, no crackles or wheezes. Heart:     Normal rate and regular rhythm. S1 and S2 normal without gallop, murmur, click, rub or other extra sounds. Abdomen:     Bowel sounds positive,abdomen soft and non-tender without masses, organomegaly or hernias noted.    Impression & Recommendations:  Problem # 1:  HYPERCHOLESTEROLEMIA (ICD-272.0)  His updated medication list for this problem includes:    Niacin Cr 500 Mg Cpcr (Niacin) .Marland Kitchen... 1 two times a day   Problem # 2:  HYPERTENSION (ICD-401.9)  The following medications were removed from the medication list:    Fosinopril Sodium 20 Mg Tabs (Fosinopril sodium) ..... One daily  His updated medication list for this problem includes:    Monopril 20 Mg Tabs (Fosinopril sodium) ..... One daily   Complete Medication List: 1)  Flomax 0.4 Mg Cp24 (Tamsulosin hcl) .Marland Kitchen.. 1 once daily 2)  Bayer Aspirin 325 Mg Tabs (Aspirin) .Marland Kitchen.. 1 once daily 3)  Niacin Cr 500 Mg Cpcr (Niacin) .Marland Kitchen.. 1 two times a day 4)  Monopril 20 Mg Tabs (Fosinopril  sodium) .... One daily   Patient Instructions: 1)  Please schedule a follow-up appointment in 1 month. 2)  Limit your Sodium (Salt) to less than 2 grams a day(slightly less than 1/2 a teaspoon) to prevent fluid retention, swelling, or worsening of symptoms. 3)  It is important that you exercise regularly at least 20 minutes 5 times a week. If you develop chest pain, have severe difficulty breathing, or feel very tired , stop exercising immediately and seek medical attention.    Prescriptions: FLOMAX 0.4 MG  CP24 (TAMSULOSIN HCL) 1 once daily  #90 x 6   Entered and Authorized by:   Marletta Lor  MD   Signed by:   Marletta Lor  MD on 05/14/2007   Method used:   Print then Give to Patient   RxID:   [id number] MONOPRIL 20 MG  TABS (FOSINOPRIL SODIUM) one daily  #90 x 6   Entered and Authorized by:   Marletta Lor  MD   Signed by:   Marletta Lor  MD on 05/14/2007   Method used:   Print then Give to Patient   RxID:   [id number] FLOMAX 0.4 MG  CP24 (TAMSULOSIN HCL) 1 once daily  #90 x 6   Entered and Authorized by:   Marletta Lor  MD   Signed by:   Marletta Lor  MD on 05/14/2007   Method used:   Print then Give to Patient   RxID:   [id number] FOSINOPRIL SODIUM 20 MG  TABS (FOSINOPRIL SODIUM) one daily  #90 x 4   Entered and Authorized by:   Marletta Lor  MD   Signed by:   Marletta Lor  MD on 05/14/2007   Method used:   Print then Give to Patient   [id number]  ]

## 2010-02-15 NOTE — Letter (Signed)
Summary: [facility]   Imported By: Laural Benes 03/15/2009 13:08:21  _____________________________________________________________________  External Attachment:    Type:   Image     Comment:   External Document

## 2010-02-15 NOTE — Progress Notes (Signed)
Summary: ? re prep   Phone Note Call from Patient Call back at [phone]   Caller: daughter regina Call For: Alexander Mcdonald Reason for Call: Talk to Nurse Summary of Call: Patient wants to speak to nurse reagarding prep instruction please fax instruction to [phone] Initial call taken by: Ronalee Red,  June 18, 2008 2:27 PM  Follow-up for Phone Call        Instructions for Movi Prep were reviewed with daughter per phone.  Questions answered Follow-up by: Ulice Dash RN,  June 18, 2008 2:55 PM

## 2010-02-15 NOTE — Letter (Signed)
Summary: [facility]   Imported By: Edmonia James 09/23/2009 11:41:11  _____________________________________________________________________  External Attachment:    Type:   Image     Comment:   External Document

## 2010-02-15 NOTE — Assessment & Plan Note (Signed)
Summary: 6 MONTH ROV/NJR   Vital Signs:  Patient Profile:   77 Years Old Male Height:     64.5 inches Weight:      162 pounds Temp:     97.5 degrees F oral BP sitting:   162 / 82  (left arm) Cuff size:   regular  Vitals Entered By: Chipper Oman, RN (December 09, 2007 8:12 AM)                 Chief Complaint:  6 mo ROV.Marland Kitchen  History of Present Illness: 78 year old patient seen today for follow-up of his hypertension.  He has been well controlled on Monopril.  He also has a history of hypercholesterolemia, which has been good with niacin.  He has a history of gout, which has been stable. There is a remote history depression, and prostate cancer. He has had a recent right cataract extraction surgery and was told by anesthesiology that he had a few PVCs.  He has been asymptomatic.  He denies any exertional chest pain or other cardiopulmonary complaints    Current Allergies: No known allergies   Past Medical History:    Reviewed history from 02/11/2007 and no changes required:       Prostate cancer, hx of       Depression       Gout       Hypertension       Borderline glaucoma       mild renal insufficiency       history of acute renal failure  Past Surgical History:    Reviewed history from 08/09/2006 and no changes required:       Carotid endarterectomy       diagnosed with prostate cancer in 2002.  He was Gleason 6 involve the right middle lobe       Cataract extraction right eye 2009   Family History:    Reviewed history from 08/09/2006 and no changes required:       Fam hx Leukemia       Family History Hypertension       both parents died at 37 brother may have had coronary artery disease.  Sister died of leukemia    Review of Systems  The patient denies anorexia, fever, weight loss, weight gain, vision loss, decreased hearing, hoarseness, chest pain, syncope, dyspnea on exertion, peripheral edema, prolonged cough, headaches, hemoptysis, abdominal pain,  melena, hematochezia, severe indigestion/heartburn, hematuria, incontinence, genital sores, muscle weakness, suspicious skin lesions, transient blindness, difficulty walking, depression, unusual weight change, abnormal bleeding, enlarged lymph nodes, angioedema, breast masses, and testicular masses.     Physical Exam  General:     Well-developed,well-nourished,in no acute distress; alert,appropriate and cooperative throughout examination Head:     Normocephalic and atraumatic without obvious abnormalities. No apparent alopecia or balding. Eyes:     No corneal or conjunctival inflammation noted. EOMI. Perrla. Funduscopic exam benign, without hemorrhages, exudates or papilledema. Vision grossly normal. Mouth:     Oral mucosa and oropharynx without lesions or exudates.  Teeth in good repair. Neck:     No deformities, masses, or tenderness noted. Chest Wall:     No deformities, masses, tenderness or gynecomastia noted. Lungs:     Normal respiratory effort, chest expands symmetrically. Lungs are clear to auscultation, no crackles or wheezes. Heart:     Normal rate and regular rhythm. S1 and S2 normal without gallop, murmur, click, rub or other extra sounds. Abdomen:     Bowel sounds positive,abdomen  soft and non-tender without masses, organomegaly or hernias noted. Msk:     No deformity or scoliosis noted of thoracic or lumbar spine.   Pulses:     R and L carotid,radial,femoral,dorsalis pedis and posterior tibial pulses are full and equal bilaterally Extremities:     No clubbing, cyanosis, edema, or deformity noted with normal full range of motion of all joints.   Cervical Nodes:     No lymphadenopathy noted    Impression & Recommendations:  Problem # 1:  HYPERCHOLESTEROLEMIA (ICD-272.0)  His updated medication list for this problem includes:    Niacin Cr 500 Mg Cpcr (Niacin) .Marland Kitchen... 1 two times a day   Problem # 2:  HYPERTENSION (ICD-401.9)  His updated medication list for this  problem includes:    Monopril 20 Mg Tabs (Fosinopril sodium) ..... One daily   Problem # 3:  GOUT (ICD-274.9) asymptomatic  Problem # 4:  DEPRESSION (ICD-311) stable off medication  Problem # 5:  PROSTATE CANCER, HX OF (ICD-V10.46) will check PSA with physical in 6 months  Complete Medication List: 1)  Flomax 0.4 Mg Cp24 (Tamsulosin hcl) .Marland Kitchen.. 1 once daily 2)  Bayer Aspirin 325 Mg Tabs (Aspirin) .Marland Kitchen.. 1 once daily 3)  Niacin Cr 500 Mg Cpcr (Niacin) .Marland Kitchen.. 1 two times a day 4)  Monopril 20 Mg Tabs (Fosinopril sodium) .... One daily   Patient Instructions: 1)  Please schedule a follow-up appointment in 6 months. 2)  Advised not to eat any food or drink any liquids after 10 PM the night before your procedure. 3)  Limit your Sodium (Salt). 4)  It is important that you exercise regularly at least 20 minutes 5 times a week. If you develop chest pain, have severe difficulty breathing, or feel very tired , stop exercising immediately and seek medical attention.   Prescriptions: MONOPRIL 20 MG  TABS (FOSINOPRIL SODIUM) one daily  #90 x 6   Entered and Authorized by:   Marletta Lor  MD   Signed by:   Marletta Lor  MD on 12/09/2007   Method used:   Print then Give to Patient   RxID:   [id number] NIACIN CR 500 MG  CPCR (NIACIN) 1 two times a day  #90 x 6   Entered and Authorized by:   Marletta Lor  MD   Signed by:   Marletta Lor  MD on 12/09/2007   Method used:   Print then Give to Patient   RxID:   [id number] FLOMAX 0.4 MG  CP24 (TAMSULOSIN HCL) 1 once daily  #90 x 6   Entered and Authorized by:   Marletta Lor  MD   Signed by:   Marletta Lor  MD on 12/09/2007   Method used:   Print then Give to Patient   [id number]  ]

## 2010-02-15 NOTE — Letter (Signed)
Summary: [facility]  [facility]   Imported By: Laural Benes 08/23/2009 13:32:46  _____________________________________________________________________  External Attachment:    Type:   Image     Comment:   External Document

## 2010-02-15 NOTE — Assessment & Plan Note (Signed)
Summary: ? loose bowels/ccm   Vital Signs:  Patient Profile:   77 Years Old Male Height:     64.5 inches Weight:      159 pounds Temp:     98.1 degrees F oral BP sitting:   138 / 68  (left arm)  Vitals Entered By: Chipper Oman, RN (December 07, 2006 11:24 AM)                 Chief Complaint:  C/o no appetite. some nausea and pain after eating and loose stools x 1 week.Marland Kitchen  History of Present Illness: 77 year old patient with a one week history of anorexia, periodic abdominal pain, and loose stools.  No vomiting, or fever  Current Allergies: No known allergies       Physical Exam  General:     Well-developed,well-nourished,in no acute distress; alert,appropriate and cooperative throughout examination Eyes:     No corneal or conjunctival inflammation noted. EOMI. Perrla. Funduscopic exam benign, without hemorrhages, exudates or papilledema. Vision grossly normal. Ears:     External ear exam shows no significant lesions or deformities.  Otoscopic examination reveals clear canals, tympanic membranes are intact bilaterally without bulging, retraction, inflammation or discharge. Hearing is grossly normal bilaterally. Mouth:     because of membranes pink and moist Neck:     No deformities, masses, or tenderness noted. Lungs:     Normal respiratory effort, chest expands symmetrically. Lungs are clear to auscultation, no crackles or wheezes. Heart:     Normal rate and regular rhythm. S1 and S2 normal without gallop, murmur, click, rub or other extra sounds.  no tachycardia Abdomen:     abdomen soft.  Bowel sounds are active.  No localized tenderness or guarding    Impression & Recommendations:  Problem # 1:  HYPERTENSION (ICD-401.9)  His updated medication list for this problem includes:    Monopril 20 Mg Tabs (Fosinopril sodium) .Marland Kitchen... 1 once daily   Problem # 2:  GOUT (ICD-274.9)  His updated medication list for this problem includes:    Allopurinol 300 Mg Tabs  (Allopurinol) .Marland Kitchen... 1 once daily   Problem # 3:  GASTROENTERITIS (ICD-558.9)  will place on Align to take one daily for a week  Complete Medication List: 1)  Allopurinol 300 Mg Tabs (Allopurinol) .Marland Kitchen.. 1 once daily 2)  Clarinex 5 Mg Tabs (Desloratadine) .Marland Kitchen.. 1 once daily 3)  Monopril 20 Mg Tabs (Fosinopril sodium) .Marland Kitchen.. 1 once daily 4)  Aspirin 325 Mg Tabs (Aspirin) .Marland Kitchen.. 1 once daily 5)  Slo-niacin 500 Mg Tbcr (Niacin) 6)  Vytorin 10-20 Mg Tabs (Ezetimibe-simvastatin) .Marland Kitchen.. 1 once daily 7)  Hyoscyamine Sulfate 0.125 Mg Tabs (Hyoscyamine sulfate) .... One every 6 hrs for pain or diarrhea   Patient Instructions: 1)  Drink clear liquids only for the next 24 hours, then slowly add other liquids and food as you  tolerate them. 2)  Please schedule a follow-up appointment as needed.    Prescriptions: HYOSCYAMINE SULFATE 0.125 MG  TABS (HYOSCYAMINE SULFATE) one every 6 hrs for pain or diarrhea  #50 x 2   Entered and Authorized by:   Marletta Lor  MD   Signed by:   Marletta Lor  MD on 12/07/2006   Method used:   Print then Give to Patient   RxID:   [id number]

## 2010-02-15 NOTE — Letter (Signed)
Summary: Dr Karsten Ro note  Dr Karsten Ro note   Imported By: Jamelle Haring 06/06/2007 10:01:17  _____________________________________________________________________  External Attachment:    Type:   Image     Comment:   Dr Karsten Ro note

## 2010-02-15 NOTE — Procedures (Signed)
Summary: Colonoscopy   Colonoscopy  Procedure date:  06/23/2008  Findings:      Location:  [facility].    COLONOSCOPY PROCEDURE REPORT  PATIENT:  Alexander Mcdonald, Alexander Mcdonald  MR#:  [id number] BIRTHDATE:   January 16, 1927, 77 yrs. old   GENDER:   male  ENDOSCOPIST:   Milus Banister, MD PROCEDURE DATE:  06/23/2008 PROCEDURE:  Colonoscopy with snare polypectomy ASA CLASS:   Class II INDICATIONS: history of pre-cancerous (adenomatous) colon polyps (january 2009, 8 polyps removed)  MEDICATIONS:    Fentanyl 25 mcg IV, Versed 4 mg IV  DESCRIPTION OF PROCEDURE:   After the risks benefits and alternatives of the procedure were thoroughly explained, informed consent was obtained.  Digital rectal exam was performed and revealed no rectal masses.   The LB [id number] endoscope was introduced through the anus and advanced to the cecum, which was identified by both the appendix and ileocecal valve, without limitations.  The quality of the prep was adequate, using MoviPrep.  The instrument was then slowly withdrawn as the colon was fully examined. <<PROCEDUREIMAGES>>        <<OLD IMAGES>>  FINDINGS:  A diminutive polyp was found in the cecum (see image2). This was 1-69mm, removed with cold snare, sent to pathology (jar 1)  This was otherwise a normal examination of the colon (see image1, image3, and image6).  Internal hemorrhoids were found. These were medium sized, non-thrombosed.  Severe diverticulosis was found sigmoid to descending The mucosa in this region was hypertrophied, edematous, quite spastic (see image5).   Retroflexed views in the rectum revealed no abnormalities.    The scope was then withdrawn from the patient and the procedure completed.  COMPLICATIONS:   None  ENDOSCOPIC IMPRESSION:  1) Diminutive polyp in the cecum, removed and sent to pathology  2) Small internal hemorrhoids  3) Severe diverticulosis in the sigmoid to descending  4) Otherwise normal  examination  RECOMMENDATIONS:  1) If the polyp(s) removed today are proven to be adenomatous (pre-cancerous) polyps, I would normally recommend repeat colonoscopy in 5 years. You will be 77 at that point and the utility of colonoscopy for polyp surveillance at that age is quite debatable.  2) You will recieve a letter within 1-2 weeks with the results of your biopsy as well as final recommendations. Please call my office if you have not received a letter after 3 weeks.  REPEAT EXAM:   await pathology   _______________________________ Milus Banister, MD  cc: Bluford Kaufmann        REPORT OF SURGICAL PATHOLOGY   Case #: [id number] Patient Name: Alexander Mcdonald. Office Chart Number:  N/A   MRN: [id number] Pathologist: H. Lynnell Chad, MD DOB/Age  [birth year] (Age: 77)    Gender: M Date Taken:  06/23/2008 Date Received: 06/23/2008   FINAL DIAGNOSIS   ***MICROSCOPIC EXAMINATION AND DIAGNOSIS***   COLON, CECUM,  POLYP, BIOPSY:  TUBULAR ADENOMA (ONE FRAGMENT).  NO HIGH GRADE DYSPLASIA OR MALIGNANCY IDENTIFIED.   cl1 Date Reported:  06/24/2008     H. Lynnell Chad, MD *** Electronically Signed Out By [facility] ***    June 28, 2008 MRN: [id number]    Alexander Mcdonald [address] [place], [place]  [address]    Dear Mr. [name],   At least one of the polyps removed during your recent procedure was proven to be adenomatous.  These are pre-cancerous polyps that may have grown into cancers if they had not been removed.  Given your age I do not  think that further followup surveillance colonoscopies are necessary.  Obviously if new symptoms arise please do not hesitate to get in touch.  Please call if you have any questions or concerns.       Sincerely,  Milus Banister MD  This letter has been electronically signed by your physician.   Signed by Milus Banister MD on 06/28/2008 at 8:25 AM    This report was created from the original endoscopy report, which was reviewed  and signed by the above listed endoscopist.

## 2010-02-15 NOTE — Progress Notes (Signed)
  Phone Note Call from Patient   Reason for Call: Lab or Test Results Summary of Call: pls call with lab results. Initial call taken by: Chipper Oman, RN,  December 20, 2006 12:49 PM  Follow-up for Phone Call        Phone Call Completed Follow-up by: Chipper Oman, RN,  December 20, 2006 1:07 PM    c/w mild diarrhea- otherwise OK; how is patient doing clinically?

## 2010-02-15 NOTE — Assessment & Plan Note (Signed)
Summary: high bp/not feeling/fatigue/rapid weight loss/cjr   Vital Signs:  Patient profile:   77 year old male Weight:      146 pounds Temp:     97.7 degrees F oral BP sitting:   110 / 70  (right arm) Cuff size:   regular  Vitals Entered By: Cay Schillings LPN (July 18, [id number] PM) CC: c/o blood pressure up and down , not feeling well        his BP machine 125/80 (R) Is Patient Diabetic? No   Primary Care Provider:  Bluford Kaufmann, MD  CC:  c/o blood pressure up and down  and not feeling well        his BP machine 125/80 (R).  History of Present Illness:  77 year old patient who is seen today for follow-up.  For the past few days.  She has felt poorly with anorexia, nausea, some vomiting and anorexia.  There is been some associated weight loss.  There is been no fever.  He has been on Diamox for glaucoma in 7 days ago.  His dose was reduced.  He feels slightly improved today compared to the weekend.  He does have a history of acute renal failure in the setting of an acute diarrheal illness, as well as mild chronic renal insufficiency.  He has treated hypertension, which has been quite labile often  with some low readings.   Allergies (verified): No Known Drug Allergies  Past History:  Past Medical History: Reviewed history from 02/11/2007 and no changes required. Prostate cancer, hx of Depression Gout Hypertension Borderline glaucoma mild renal insufficiency history of acute renal failure  Review of Systems       The patient complains of anorexia and weight loss.  The patient denies fever, weight gain, vision loss, decreased hearing, hoarseness, chest pain, syncope, dyspnea on exertion, peripheral edema, prolonged cough, headaches, hemoptysis, abdominal pain, melena, hematochezia, severe indigestion/heartburn, hematuria, incontinence, genital sores, muscle weakness, suspicious skin lesions, transient blindness, difficulty walking, depression, unusual weight change,  abnormal bleeding, enlarged lymph nodes, angioedema, breast masses, and testicular masses.    Physical Exam  General:  elderly alert, no distress.  Blood pressure 130/80 Head:  Normocephalic and atraumatic without obvious abnormalities. No apparent alopecia or balding. Eyes:  conjunctiva injection Ears:  External ear exam shows no significant lesions or deformities.  Otoscopic examination reveals clear canals, tympanic membranes are intact bilaterally without bulging, retraction, inflammation or discharge. Hearing is grossly normal bilaterally. Mouth:  Oral mucosa and oropharynx without lesions or exudates.  Teeth in good repair. Neck:  No deformities, masses, or tenderness noted. Lungs:  Normal respiratory effort, chest expands symmetrically. Lungs are clear to auscultation, no crackles or wheezes. Heart:  Normal rate and regular rhythm. S1 and S2 normal without gallop, murmur, click, rub or other extra sounds. Abdomen:  Bowel sounds positive,abdomen soft and non-tender without masses, organomegaly or hernias noted.   Impression & Recommendations:  Problem # 1:  VOMITING (ICD-787.03)  Problem # 2:  HYPERTENSION (ICD-401.9)  His updated medication list for this problem includes:    Monopril 20 Mg Tabs (Fosinopril sodium) ..... One daily    Acetazolamide 250 Mg Tabs (Acetazolamide) ..... One daily  Complete Medication List: 1)  Flomax 0.4 Mg Cp24 (Tamsulosin hcl) .Marland Kitchen.. 1 once daily 2)  Bayer Aspirin 325 Mg Tabs (Aspirin) .Marland Kitchen.. 1 once daily 3)  Monopril 20 Mg Tabs (Fosinopril sodium) .... One daily 4)  Travatan 0.004 % Soln (Travoprost) .... Use ou once daily 5)  Singulair 10 Mg Tabs (Montelukast sodium) .... Take 1 tablet by mouth once a day 6)  Proctocare-hc 2.5 % Crea (Hydrocortisone) .... Use four times daily 7)  Vytorin 10-20 Mg Tabs (Ezetimibe-simvastatin) .... One daily 8)  Acetazolamide 250 Mg Tabs (Acetazolamide) .... One daily 9)  Promethazine Hcl 25 Mg Tabs (Promethazine hcl)  .... One every  6 hours for nausea  Patient Instructions: 1)  Drink clear liquids only for the next 24 hours, then slowly add other liquids and food as you  tolerate them. 2)  Hold monopril 3)  Please schedule a follow-up appointment in 2 weeks. 4)  Prilosec 20 mg daily Prescriptions: PROMETHAZINE HCL 25 MG TABS (PROMETHAZINE HCL) one every  6 hours for nausea  #30 x 2   Entered and Authorized by:   Marletta Lor  MD   Signed by:   Marletta Lor  MD on 08/02/2009   Method used:   Electronically to        [facility] [address] [facility] [phone] (retail)       [address]       [place], [place]  [address]       Ph: [phone]       Fax: [phone]   RxID:   [id number]

## 2010-02-15 NOTE — Progress Notes (Signed)
Summary: please return call  Phone Note Call from Patient Call back at [phone]   Caller: jill @ [facility]---voice mail Reason for Call: Talk to Nurse, Talk to Doctor Summary of Call: Need info. was supposed to see the dr about his elevated bp. please return call Initial call taken by: Despina Arias,  July 09, 2009 12:52 PM  Follow-up for Phone Call        Called again today for info re BP Farmer with Dr. Lavell Anchors.  Gave BP & med to follow & fu ov today Dr. Raliegh Ip.   Follow-up by: Shelbie Hutching, RN,  July 12, 2009 10:38 AM

## 2010-02-15 NOTE — Assessment & Plan Note (Signed)
Summary: 1 month Alexander Mcdonald/njr   Vital Signs:  Patient Profile:   77 Years Old Male Height:     64.5 inches Weight:      159 pounds Temp:     97.8 degrees F oral BP sitting:   142 / 60  (left arm) Cuff size:   regular  Vitals Entered By: Chipper Oman, RN (Jun 11, 2007 11:02 AM)                 Chief Complaint:  Alexander Mcdonald.Marland Kitchen  History of Present Illness: 77 year old gentleman seen today for follow-up of his hypertension.  He is asymptomatic.    Current Allergies: No known allergies       Physical Exam  General:     Well-developed,well-nourished,in no acute distress; alert,appropriate and cooperative throughout examination  126/72 Head:     Normocephalic and atraumatic without obvious abnormalities. No apparent alopecia or balding. Mouth:     Oral mucosa and oropharynx without lesions or exudates.  Teeth in good repair. Neck:     No deformities, masses, or tenderness noted. Lungs:     Normal respiratory effort, chest expands symmetrically. Lungs are clear to auscultation, no crackles or wheezes. Heart:     Normal rate and regular rhythm. S1 and S2 normal without gallop, murmur, click, rub or other extra sounds.    Impression & Recommendations:  Problem # 1:  HYPERTENSION (ICD-401.9)  His updated medication list for this problem includes:    Monopril 20 Mg Tabs (Fosinopril sodium) ..... One daily   Complete Medication List: 1)  Flomax 0.4 Mg Cp24 (Tamsulosin hcl) .Marland Kitchen.. 1 once daily 2)  Bayer Aspirin 325 Mg Tabs (Aspirin) .Marland Kitchen.. 1 once daily 3)  Niacin Cr 500 Mg Cpcr (Niacin) .Marland Kitchen.. 1 two times a day 4)  Monopril 20 Mg Tabs (Fosinopril sodium) .... One daily   Patient Instructions: 1)  Please schedule a follow-up appointment in 6 months. 2)  Limit your Sodium (Salt) to less than 2 grams a day(slightly less than 1/2 a teaspoon) to prevent fluid retention, swelling, or worsening of symptoms. 3)  It is important that you exercise regularly at least 20 minutes 5 times a  week. If you develop chest pain, have severe difficulty breathing, or feel very tired , stop exercising immediately and seek medical attention.   ]

## 2010-02-15 NOTE — Assessment & Plan Note (Signed)
Summary: 1 MONTH FOLLOW UP/CJR   Vital Signs:  Patient profile:   77 year old male Weight:      142 pounds Temp:     97.8 degrees F oral BP sitting:   118 / 70  (right arm) Cuff size:   regular  Vitals Entered By: Cay Schillings LPN (August  1, 624THL 10:03 AM) CC: rov - f/u on pressure Is Patient Diabetic? No   Primary Care Provider:  Bluford Kaufmann, MD  CC:  rov - f/u on pressure.  History of Present Illness: 77 year old patient who is followed closely by ophthalmology due to glaucoma.  Unfortunately, his intraocular pressures increased on once daily, Diamox, and he is now on a b.i.d. regimen.  He has had considerable nausea since starting this medication.  Presently, he is taken Phenergan two to 3 times daily with some benefit.  He continues to lose weight, modestly.  His Monopril is on hold due to history of acute renal failure with dehydration and his blood pressures have remained in a low normal range.  He does have some associated vomiting  Allergies (verified): No Known Drug Allergies  Past History:  Past Medical History: Prostate cancer, hx of Depression Gout Hypertension  glaucoma mild renal insufficiency history of acute renal failure  Family History: Reviewed history from 07/16/2008 and no changes required. Fam hx Leukemia Family History Hypertension both parents died at 68 brother may have had coronary artery disease.  Sister died of leukemia two brothers, 3 sisters  Social History: Reviewed history from 07/25/2006 and no changes required. Married Former Smoker Alcohol use-yes  Review of Systems       The patient complains of anorexia and weight loss.  The patient denies fever, weight gain, vision loss, decreased hearing, hoarseness, chest pain, syncope, dyspnea on exertion, peripheral edema, prolonged cough, headaches, hemoptysis, abdominal pain, melena, hematochezia, severe indigestion/heartburn, hematuria, incontinence, genital sores, muscle  weakness, suspicious skin lesions, transient blindness, difficulty walking, depression, unusual weight change, abnormal bleeding, enlarged lymph nodes, angioedema, breast masses, and testicular masses.    Physical Exam  General:  Well-developed,well-nourished,in no acute distress; alert,appropriate and cooperative throughout examination Head:  Normocephalic and atraumatic without obvious abnormalities. No apparent alopecia or balding. Mouth:  appears well hydrated Neck:  No deformities, masses, or tenderness noted. Lungs:  Normal respiratory effort, chest expands symmetrically. Lungs are clear to auscultation, no crackles or wheezes. Heart:  Normal rate and regular rhythm. S1 and S2 normal without gallop, murmur, click, rub or other extra sounds. Abdomen:  Bowel sounds positive,abdomen soft and non-tender without masses, organomegaly or hernias noted.   Impression & Recommendations:  Problem # 1:  VOMITING (ICD-787.03)  who seems to be related to Diamox therapy.  The patient has scheduled an  appointment with another ophthalmologist tomorrow  Problem # 2:  HYPERTENSION (ICD-401.9)  His updated medication list for this problem includes:    Monopril 20 Mg Tabs (Fosinopril sodium) ..... One daily    Acetazolamide 250 Mg Tabs (Acetazolamide) ..... One daily will continue  to hold Monopril    His updated medication list for this problem includes:    Monopril 20 Mg Tabs (Fosinopril sodium) ..... One daily    Acetazolamide 250 Mg Tabs (Acetazolamide) ..... One daily  Complete Medication List: 1)  Flomax 0.4 Mg Cp24 (Tamsulosin hcl) .Marland Kitchen.. 1 once daily 2)  Bayer Aspirin 325 Mg Tabs (Aspirin) .Marland Kitchen.. 1 once daily 3)  Monopril 20 Mg Tabs (Fosinopril sodium) .... One daily 4)  Travatan 0.004 %  Soln (Travoprost) .... Use ou once daily 5)  Singulair 10 Mg Tabs (Montelukast sodium) .... Take 1 tablet by mouth once a day 6)  Proctocare-hc 2.5 % Crea (Hydrocortisone) .... Use four times daily 7)   Vytorin 10-20 Mg Tabs (Ezetimibe-simvastatin) .... One daily 8)  Acetazolamide 250 Mg Tabs (Acetazolamide) .... One daily 9)  Promethazine Hcl 25 Mg Tabs (Promethazine hcl) .... One every  6 hours for nausea  Other Orders: TLB-CBC Platelet - w/Differential (85025-CBCD) TLB-BMP (Basic Metabolic Panel-BMET) (99991111) TLB-Sedimentation Rate (ESR) (85652-ESR) Specimen Handling (99000)  Patient Instructions: 1)  Please schedule a follow-up appointment in 2 weeks. 2)  Drink as much fluid as you can tolerate for the next few days.

## 2010-02-15 NOTE — Assessment & Plan Note (Signed)
Summary: 6 MOS FOLLOW UP/MAE//RSH//LH/PT [facility] FROM BMP/CJR   Vital Signs:  Patient profile:   77 year old male Weight:      154 pounds Temp:     97.4 degrees F oral BP sitting:   120 / 80  (left arm) Cuff size:   regular  Vitals Entered By: Cay Schillings LPN (May 31, 624THL 579FGE AM) CC: 6 mos rov - doing well Is Patient Diabetic? No   Primary Care Provider:  Bluford Kaufmann, MD  CC:  6 mos rov - doing well.  History of Present Illness: 77 year old patient who is seen today for his 6 month follow-up.  He has a history of hypertension, dyslipidemia, and remote history of prostate cancer.  He is followed by urology.  He is last PSA here was elevated.  He denies any cardiopulmonary complaints.  He is reason, laser eye surgery for apparently, glaucoma.  He has carotid artery disease, status post prior carotid endarterectomy.  A recent carotid artery.  Doppler ultrasound unremarkable  Allergies (verified): No Known Drug Allergies  Past History:  Past Medical History: Reviewed history from 02/11/2007 and no changes required. Prostate cancer, hx of Depression Gout Hypertension Borderline glaucoma mild renal insufficiency history of acute renal failure  Past Surgical History: Carotid endarterectomy diagnosed with prostate cancer in 2002.  He was Gleason 6 involve the right middle lobe Cataract extraction right eye 2009  colonoscopy June 2010 carotid artery Doppler ultrasound, April 2010, 5-11  Family History: Reviewed history from 07/16/2008 and no changes required. Fam hx Leukemia Family History Hypertension both parents died at 47 brother may have had coronary artery disease.  Sister died of leukemia two brothers, 3 sisters  Social History: Reviewed history from 07/25/2006 and no changes required. Married Former Smoker Alcohol use-yes  Physical Exam  General:  Well-developed,well-nourished,in no acute distress; alert,appropriate and cooperative throughout  examination Head:  Normocephalic and atraumatic without obvious abnormalities. No apparent alopecia or balding. Eyes:  No corneal or conjunctival inflammation noted. EOMI. Perrla. Funduscopic exam benign, without hemorrhages, exudates or papilledema. Vision grossly normal. Mouth:  Oral mucosa and oropharynx without lesions or exudates.   Neck:  No deformities, masses, or tenderness noted. Lungs:  Normal respiratory effort, chest expands symmetrically. Lungs are clear to auscultation, no crackles or wheezes. Heart:  Normal rate and regular rhythm. S1 and S2 normal without gallop, murmur, click, rub or other extra sounds. Abdomen:  Bowel sounds positive,abdomen soft and non-tender without masses, organomegaly or hernias noted. Msk:  No deformity or scoliosis noted of thoracic or lumbar spine.   Pulses:  R and L carotid,radial,femoral,dorsalis pedis and posterior tibial pulses are full and equal bilaterally Extremities:  No clubbing, cyanosis, edema, or deformity noted with normal full range of motion of all joints.     Impression & Recommendations:  Problem # 1:  CAROTID ARTERY DISEASE (ICD-433.10)  His updated medication list for this problem includes:    Bayer Aspirin 325 Mg Tabs (Aspirin) .Marland Kitchen... 1 once daily  His updated medication list for this problem includes:    Bayer Aspirin 325 Mg Tabs (Aspirin) .Marland Kitchen... 1 once daily  Problem # 2:  HYPERCHOLESTEROLEMIA (ICD-272.0)  His updated medication list for this problem includes:    Niacin Cr 500 Mg Cpcr (Niacin) .Marland Kitchen... 1 two times a day    Vytorin 10-20 Mg Tabs (Ezetimibe-simvastatin) ..... One daily    His updated medication list for this problem includes:    Niacin Cr 500 Mg Cpcr (Niacin) .Marland Kitchen... 1 two times  a day    Vytorin 10-20 Mg Tabs (Ezetimibe-simvastatin) ..... One daily  Problem # 3:  HYPERTENSION (ICD-401.9)  His updated medication list for this problem includes:    Monopril 20 Mg Tabs (Fosinopril sodium) ..... One daily  His  updated medication list for this problem includes:    Monopril 20 Mg Tabs (Fosinopril sodium) ..... One daily  Complete Medication List: 1)  Flomax 0.4 Mg Cp24 (Tamsulosin hcl) .Marland Kitchen.. 1 once daily 2)  Bayer Aspirin 325 Mg Tabs (Aspirin) .Marland Kitchen.. 1 once daily 3)  Niacin Cr 500 Mg Cpcr (Niacin) .Marland Kitchen.. 1 two times a day 4)  Monopril 20 Mg Tabs (Fosinopril sodium) .... One daily 5)  Travatan 0.004 % Soln (Travoprost) .... Use ou once daily 6)  Singulair 10 Mg Tabs (Montelukast sodium) .... Take 1 tablet by mouth once a day 7)  Proctocare-hc 2.5 % Crea (Hydrocortisone) .... Use four times daily 8)  Vytorin 10-20 Mg Tabs (Ezetimibe-simvastatin) .... One daily  Other Orders: Venipuncture [id number]) TLB-Lipid Panel (80061-LIPID) Prescription Created Electronically [phone])  Patient Instructions: 1)  Please schedule a follow-up appointment in 6 months for CPX 2)  Advised not to eat any food or drink any liquids after 10 PM the night before your procedure. 3)  Limit your Sodium (Salt) to less than 2 grams a day(slightly less than 1/2 a teaspoon) to prevent fluid retention, swelling, or worsening of symptoms. 4)  It is important that you exercise regularly at least 20 minutes 5 times a week. If you develop chest pain, have severe difficulty breathing, or feel very tired , stop exercising immediately and seek medical attention. Prescriptions: VYTORIN 10-20 MG TABS (EZETIMIBE-SIMVASTATIN) one daily  #90 x 6   Entered and Authorized by:   Marletta Lor  MD   Signed by:   Marletta Lor  MD on 06/15/2009   Method used:   Electronically to        [facility] # [phone]* (retail)       [address]       [place], [place]  [address]       Ph: [phone]       Fax: [phone]   RxID:   [id number] SINGULAIR 10 MG TABS (MONTELUKAST SODIUM) Take 1 tablet by mouth once a day  #30 x 3   Entered and Authorized by:   Marletta Lor  MD   Signed by:   Marletta Lor  MD on 06/15/2009    Method used:   Electronically to        [facility] # [phone]* (retail)       [address]       [place], [place]  [address]       Ph: [phone]       Fax: [phone]   RxID:   [id number] MONOPRIL 20 MG  TABS (FOSINOPRIL SODIUM) one daily  #90 x 6   Entered and Authorized by:   Marletta Lor  MD   Signed by:   Marletta Lor  MD on 06/15/2009   Method used:   Electronically to        [facility] # [phone]* (retail)       [address]       [place], [place]  [address]       Ph: [phone]       Fax: [phone]   RxID:   [id number] NIACIN CR 500 MG  CPCR (NIACIN) 1 two times a day  #  90 x 6   Entered and Authorized by:   Marletta Lor  MD   Signed by:   Marletta Lor  MD on 06/15/2009   Method used:   Electronically to        [facility] # [phone]* (retail)       [address]       [place], [place]  [address]       Ph: [phone]       Fax: [phone]   RxID:   [id number] FLOMAX 0.4 MG  CP24 (TAMSULOSIN HCL) 1 once daily  #90 x 6   Entered and Authorized by:   Marletta Lor  MD   Signed by:   Marletta Lor  MD on 06/15/2009   Method used:   Electronically to        [facility] # [phone]* (retail)       [address]       [place], [place]  [address]       Ph: [phone]       Fax: [phone]   [place]:   [id number]

## 2010-02-15 NOTE — Assessment & Plan Note (Signed)
Summary: hosp follow up./mhf   Vital Signs:  Patient Profile:   77 Years Old Male Height:     64.5 inches Weight:      148 pounds Temp:     97.5 degrees F oral BP sitting:   120 / 78  (left arm)  Vitals Entered By: Chipper Oman, RN (January 04, 2007 4:02 PM)                 Chief Complaint:  Hosp f/u; feels good. Labs done on Tues. Meds on hold. Finished abx..  History of Present Illness: 77 year old patient seen today for follow-up.  he was hospitalized recently for severe acute on chronic renal failure.  He returned for lab 3 days ago, and creatinine was 3.4, down from greater than 15.  For the past few days, he has felt much better and stronger.  He is eating and drinking well. In the hospital.  He was started on protonic for which he continues along with Flomax.  All the medications have been held  Current Allergies: No known allergies   Past Medical History:    Prostate cancer, hx of    Depression    Gout    Hypertension    Borderline glaucoma    mild renal insufficiency     Review of Systems       The patient complains of weight loss.         regaining strength daily   Physical Exam  General:     Well-developed,well-nourished,in no acute distress; alert,appropriate and cooperative throughout examination  blood pressure 110/70 Head:     Normocephalic and atraumatic without obvious abnormalities. No apparent alopecia or balding. Mouth:     Oral mucosa and oropharynx without lesions or exudates.  Teeth in good repair. Neck:     No deformities, masses, or tenderness noted. Lungs:     Normal respiratory effort, chest expands symmetrically. Lungs are clear to auscultation, no crackles or wheezes. Heart:     Normal rate and regular rhythm. S1 and S2 normal without gallop, murmur, click, rub or other extra sounds. Abdomen:     Bowel sounds positive,abdomen soft and non-tender without masses, organomegaly or hernias noted. Msk:     No deformity or  scoliosis noted of thoracic or lumbar spine.      Impression & Recommendations:  Problem # 1:  HYPERTENSION (ICD-401.9)  His updated medication list for this problem includes:    Monopril 20 Mg Tabs (Fosinopril sodium) .Marland Kitchen... 1 once daily   Problem # 2:  ACUTE RENAL FAILURE W/LESION OF TUBULAR NECROSIS (ICD-584.5)  Problem # 3:  HYPERCHOLESTEROLEMIA (ICD-272.0)  His updated medication list for this problem includes:    Slo-niacin 500 Mg Tbcr (Niacin)    Vytorin 10-20 Mg Tabs (Ezetimibe-simvastatin) .Marland Kitchen... 1 once daily   Complete Medication List: 1)  Allopurinol 300 Mg Tabs (Allopurinol) .Marland Kitchen.. 1 once daily 2)  Clarinex 5 Mg Tabs (Desloratadine) .Marland Kitchen.. 1 once daily 3)  Monopril 20 Mg Tabs (Fosinopril sodium) .Marland Kitchen.. 1 once daily 4)  Aspirin 325 Mg Tabs (Aspirin) .Marland Kitchen.. 1 once daily 5)  Slo-niacin 500 Mg Tbcr (Niacin) 6)  Vytorin 10-20 Mg Tabs (Ezetimibe-simvastatin) .Marland Kitchen.. 1 once daily 7)  Hyoscyamine Sulfate 0.125 Mg Tabs (Hyoscyamine sulfate) .... One every 6 hrs for pain or diarrhea 8)  Flomax 0.4 Mg Cp24 (Tamsulosin hcl) .Marland Kitchen.. 1 once daily 9)  Protonix 40 Mg Pack (Pantoprazole sodium) .Marland Kitchen.. 1 once daily   Patient Instructions: 1)  Please schedule a  follow-up appointment in 3 months.    ]

## 2010-02-15 NOTE — Assessment & Plan Note (Signed)
Summary: physical/fasting labs   Vital Signs:  Patient Profile:   77 Years Old Male Height:     64.5 inches Weight:      160 pounds Temp:     97.7 degrees F oral BP sitting:   130 / 64  (left arm)  Vitals Entered By: Chipper Oman, RN (August 09, 2006 8:53 AM)             Is Patient Diabetic? No   Chief Complaint:  OV and fasting.Marland Kitchen  History of Present Illness: 77 year old male, hypertension, dyslipidemia, history of hyperuricemia and gout remote history of depression.  He says post left carotid endarterectomy in 1997.  He did have a follow-up ultrasound in April of 2007.  He is followed by Dr. on for prostate cancer and also has seen a medical oncologist.  No well.  No focal complaints  Current Allergies: No known allergies   Past Surgical History:    Carotid endarterectomy    diagnosed with prostate cancer in 2002.  He was Gleason 6 involve the right middle lobe   Family History:    Fam hx Leukemia    Family History Hypertension    both parents died at 45 brother may have had coronary artery disease.  Sister died of leukemia    Review of Systems  The patient denies anorexia, fever, weight loss, vision loss, decreased hearing, hoarseness, chest pain, syncope, dyspnea on exhertion, peripheral edema, prolonged cough, hemoptysis, abdominal pain, melena, hematochezia, severe indigestion/heartburn, hematuria, incontinence, genital sores, muscle weakness, suspicious skin lesions, transient blindness, difficulty walking, depression, unusual weight change, abnormal bleeding, enlarged lymph nodes, angioedema, breast masses, and testicular masses.     Physical Exam  General:     Well-developed,well-nourished,in no acute distress; alert,appropriate and cooperative throughout examination Head:     Normocephalic and atraumatic without obvious abnormalities. No apparent alopecia or balding. Eyes:     No corneal or conjunctival inflammation noted. EOMI. Perrla. Funduscopic exam  benign, without hemorrhages, exudates or papilledema. Vision grossly normal. Ears:     minimal cerumen Mouth:     Oral mucosa and oropharynx without lesions or exudates.  Teeth in good repair. Neck:     left carotid endarterectomy scar Chest Wall:     No deformities, masses, tenderness or gynecomastia noted. Lungs:     Normal respiratory effort, chest expands symmetrically. Lungs are clear to auscultation, no crackles or wheezes. Heart:     Normal rate and regular rhythm. S1 and S2 normal without gallop, murmur, click, rub or other extra sounds. Abdomen:     Bowel sounds positive,abdomen soft and non-tender without masses, organomegaly or hernias noted. Genitalia:     Testes bilaterally descended without nodularity, tenderness or masses. No scrotal masses or lesions. No penis lesions or urethral discharge. Msk:     No deformity or scoliosis noted of thoracic or lumbar spine.   Pulses:     the right dorsalis pedis pulse diminished Extremities:     No clubbing, cyanosis, edema, or deformity noted with normal full range of motion of all joints.   Neurologic:     No cranial nerve deficits noted. Station and gait are normal. Plantar reflexes are down-going bilaterally. DTRs are symmetrical throughout. Sensory, motor and coordinative functions appear intact.    Impression & Recommendations:  Problem # 1:  HYPERCHOLESTEROLEMIA (ICD-272.0)  His updated medication list for this problem includes:    Simvastatin 20 Mg Tabs (Simvastatin)    Slo-niacin 500 Mg Tbcr (Niacin)  Orders: Venipuncture (  [id number]) TLB-BMP (Basic Metabolic Panel-BMET) (99991111) TLB-CBC Platelet - w/Differential (85025-CBCD) TLB-Hepatic/Liver Function Pnl (80076-HEPATIC) TLB-TSH (Thyroid Stimulating Hormone) (84443-TSH)   Problem # 2:  HYPERTENSION (ICD-401.9)  His updated medication list for this problem includes:    Monopril 20 Mg Tabs (Fosinopril sodium)  Orders: TLB-BMP (Basic Metabolic Panel-BMET)  (99991111) TLB-CBC Platelet - w/Differential (85025-CBCD) TLB-Hepatic/Liver Function Pnl (80076-HEPATIC) TLB-TSH (Thyroid Stimulating Hormone) (84443-TSH)   Problem # 3:  GOUT (ICD-274.9)  His updated medication list for this problem includes:    Allopurinol 300 Mg Tabs (Allopurinol)  Orders: TLB-BMP (Basic Metabolic Panel-BMET) (99991111) TLB-CBC Platelet - w/Differential (85025-CBCD) TLB-Hepatic/Liver Function Pnl (80076-HEPATIC) TLB-TSH (Thyroid Stimulating Hormone) (84443-TSH)   Problem # 4:  PROSTATE CANCER, HX OF (ICD-V10.46)  Orders: Venipuncture [id number]) TLB-BMP (Basic Metabolic Panel-BMET) (99991111) TLB-CBC Platelet - w/Differential (85025-CBCD) TLB-Hepatic/Liver Function Pnl (80076-HEPATIC) TLB-TSH (Thyroid Stimulating Hormone) (84443-TSH)   Medications Added to Medication List This Visit: 1)  Simvastatin 20 Mg Tabs (Simvastatin) 2)  Aspirin 325 Mg Tabs (Aspirin) 3)  Slo-niacin 500 Mg Tbcr (Niacin) SRY he and he pain in her right eye here for a for a we will is a for  Patient Instructions: 1)  Please schedule a follow-up appointment in 6 months. 2)  Discussed importance of regular exercise and recommended starting or continuing a regular exercise program for good health. 3)  Discussed the risks-benefits and indications for preventive Aspirin therapy. Recommend that the patient start (or continue) taking 325 mg of Enteric-coated Aspirin a day.    Prescriptions: SLO-NIACIN 500 MG  TBCR (NIACIN)   #200 x 6   Entered and Authorized by:   Marletta Lor  MD   Signed by:   Marletta Lor  MD on 08/09/2006   Method used:   Print then Give to Patient   RxID:   [id number] SIMVASTATIN 20 MG  TABS (SIMVASTATIN)   #100 x 6   Entered and Authorized by:   Marletta Lor  MD   Signed by:   Marletta Lor  MD on 08/09/2006   Method used:   Print then Give to Patient   RxID:   [id number] MONOPRIL 20 MG TABS (FOSINOPRIL  SODIUM)   #100 x 6   Entered and Authorized by:   Marletta Lor  MD   Signed by:   Marletta Lor  MD on 08/09/2006   Method used:   Print then Give to Patient   RxID:   [id number] CLARINEX 5 MG TABS (DESLORATADINE)   #50 x 6   Entered and Authorized by:   Marletta Lor  MD   Signed by:   Marletta Lor  MD on 08/09/2006   Method used:   Print then Give to Patient   RxID:   [id number] ALLOPURINOL 300 MG TABS (ALLOPURINOL)   #100 x 6   Entered and Authorized by:   Marletta Lor  MD   Signed by:   Marletta Lor  MD on 08/09/2006   Method used:   Print then Give to Patient   RxID:   [id number]       Appended Document: Orders Update, add EKG    Clinical Lists Changes  Orders: Added new Service order of EKG w/ Interpretation (93000) - Signed

## 2010-02-15 NOTE — Assessment & Plan Note (Signed)
Summary: 4 month rov/njr   Vital Signs:  Patient profile:   77 year old male Weight:      167 pounds BMI:     28.32 BP sitting:   122 / 54  (left arm) Cuff size:   regular  Vitals Entered By: Chipper Oman, RN (December 01, 2008 12:57 PM) CC: 4 mo ROV. Is Patient Diabetic? No   Primary Care Provider:  Bluford Kaufmann, MD  CC:  4 mo ROV.Marland Kitchen  History of Present Illness: an 77 year old patient who is in today for follow-up.  He has a history of hypertension, dyslipidemia, which have been stable.  He has a history of prostate cancer and is being followed by urology.  No concerns or complaints.  Last visit.  He was seen for an annual exam.  PSA was slightly elevated  Allergies: No Known Drug Allergies  Past History:  Past Medical History: Reviewed history from 02/11/2007 and no changes required. Prostate cancer, hx of Depression Gout Hypertension Borderline glaucoma mild renal insufficiency history of acute renal failure  Review of Systems  The patient denies anorexia, fever, weight loss, weight gain, vision loss, decreased hearing, hoarseness, chest pain, syncope, dyspnea on exertion, peripheral edema, prolonged cough, headaches, hemoptysis, abdominal pain, melena, hematochezia, severe indigestion/heartburn, hematuria, incontinence, genital sores, muscle weakness, suspicious skin lesions, transient blindness, difficulty walking, depression, unusual weight change, abnormal bleeding, enlarged lymph nodes, angioedema, breast masses, and testicular masses.    Physical Exam  General:  Well-developed,well-nourished,in no acute distress; alert,appropriate and cooperative throughout examination; 124/64 Head:  Normocephalic and atraumatic without obvious abnormalities. No apparent alopecia or balding. Eyes:  No corneal or conjunctival inflammation noted. EOMI. Perrla. Funduscopic exam benign, without hemorrhages, exudates or papilledema. Vision grossly normal. Mouth:  Oral mucosa  and oropharynx without lesions or exudates.  Teeth in good repair. Neck:  No deformities, masses, or tenderness noted. Lungs:  Normal respiratory effort, chest expands symmetrically. Lungs are clear to auscultation, no crackles or wheezes. Heart:  Normal rate and regular rhythm. S1 and S2 normal without gallop, murmur, click, rub or other extra sounds. Abdomen:  Bowel sounds positive,abdomen soft and non-tender without masses, organomegaly or hernias noted.   Impression & Recommendations:  Problem # 1:  HYPERCHOLESTEROLEMIA (ICD-272.0)  His updated medication list for this problem includes:    Niacin Cr 500 Mg Cpcr (Niacin) .Marland Kitchen... 1 two times a day  His updated medication list for this problem includes:    Niacin Cr 500 Mg Cpcr (Niacin) .Marland Kitchen... 1 two times a day  Problem # 2:  HYPERTENSION (ICD-401.9)  His updated medication list for this problem includes:    Monopril 20 Mg Tabs (Fosinopril sodium) ..... One daily  His updated medication list for this problem includes:    Monopril 20 Mg Tabs (Fosinopril sodium) ..... One daily  Complete Medication List: 1)  Flomax 0.4 Mg Cp24 (Tamsulosin hcl) .Marland Kitchen.. 1 once daily 2)  Bayer Aspirin 325 Mg Tabs (Aspirin) .Marland Kitchen.. 1 once daily 3)  Niacin Cr 500 Mg Cpcr (Niacin) .Marland Kitchen.. 1 two times a day 4)  Monopril 20 Mg Tabs (Fosinopril sodium) .... One daily 5)  Travatan 0.004 % Soln (Travoprost) .... Use ou once daily 6)  Singulair 10 Mg Tabs (Montelukast sodium) .... Take 1 tablet by mouth once a day  Patient Instructions: 1)  Please schedule a follow-up appointment in 6 months. 2)  Limit your Sodium (Salt). 3)  It is important that you exercise regularly at least 20 minutes 5 times a week.  If you develop chest pain, have severe difficulty breathing, or feel very tired , stop exercising immediately and seek medical attention.

## 2010-02-15 NOTE — Miscellaneous (Signed)
  Clinical Lists Changes  Medications: Changed medication from CLARINEX 5 MG TABS (DESLORATADINE) to CLARINEX 5 MG TABS (DESLORATADINE) 1 once daily Changed medication from SIMVASTATIN 20 MG  TABS (SIMVASTATIN) to SIMVASTATIN 20 MG  TABS (SIMVASTATIN) 1 once daily Changed medication from ASPIRIN 325 MG  TABS (ASPIRIN) to ASPIRIN 325 MG  TABS (ASPIRIN) 1 once daily Changed medication from ALLOPURINOL 300 MG TABS (ALLOPURINOL) to ALLOPURINOL 300 MG TABS (ALLOPURINOL) 1 once daily Changed medication from MONOPRIL 20 MG TABS (FOSINOPRIL SODIUM) to MONOPRIL 20 MG TABS (FOSINOPRIL SODIUM) 1 once daily

## 2010-02-15 NOTE — Consult Note (Signed)
Summary: [facility]   Imported By: Laural Benes 03/21/2007 16:25:50  _____________________________________________________________________  External Attachment:    Type:   Image     Comment:   External Document

## 2010-02-15 NOTE — Miscellaneous (Signed)
Summary: Orders Update  Clinical Lists Changes  Problems: Added new problem of CAROTID ARTERY DISEASE (ICD-433.10) Orders: Added new Test order of Carotid Duplex (Carotid Duplex) - Signed 

## 2010-02-17 NOTE — Miscellaneous (Signed)
Summary: 21 Orders/[facility]  40 Orders/[facility]   Imported By: Laural Benes 12/30/2009 13:44:19  _____________________________________________________________________  External Attachment:    Type:   Image     Comment:   External Document

## 2010-02-17 NOTE — Miscellaneous (Signed)
Summary: Notice of OT & Agency Discharge/[facility]  Notice of OT & Agency Discharge/[facility]   Imported By: Edmonia James 01/07/2010 10:22:05  _____________________________________________________________________  External Attachment:    Type:   Image     Comment:   External Document

## 2010-05-31 NOTE — Assessment & Plan Note (Signed)
[place]                                 ON-CALL NOTE   TREVELL, STJEAN                        MRN:          [id number]  DATE:12/23/2006                            DOB:          1927-01-09    PRIMARY CARE PHYSICIAN:  Marletta Lor, M.D.   CALLER:  Lelon Frohlich, his daughter.   TELEPHONE NUMBER:  [phone]   The caller states that they are on the way to the [facility] emergency room for him to be seen for prolonged diarrhea and  now vomiting.  Apparently he has had diarrhea for two weeks.  He has  been seen in the office on several occasions by Dr. Burnice [place] and also  his urologist.  Multiple tests have all proven negative.  He continues  to have diarrhea and now today has nausea and vomiting for the first  time.  There is not much in the way of pain or fever, but he is becoming  quite weak.  My response is that I agree and go ahead and take him to  the emergency room for further evaluation and treatment.     Ishmael Holter. Sarajane Jews, MD  Electronically Signed    SAF/MedQ  DD: 12/23/2006  DT: 12/23/2006  Job #: [id number]

## 2010-05-31 NOTE — Discharge Summary (Signed)
Alexander Mcdonald, Alexander Mcdonald               ACCOUNT NO.:  [id number]   MEDICAL RECORD NO.:  [id number]          PATIENT TYPE:  INP   LOCATION:  74                         [facility]ACILITY[facility]  [facility]   PHYSICIAN:  Evie Lacks. Plotnikov, MDDATE OF BIRTH:  1926-07-16   DATE OF ADMISSION:  12/23/2006  DATE OF DISCHARGE:  12/29/2006                               DISCHARGE SUMMARY   DISCHARGE DIAGNOSES:  1. Acute renal failure likely due to acute tubular necrosis, improved.  2. Diarrhea, resolved.  3. Benign prostatic hypertrophy.  4. History of gout.  5. Hypertension.  6. Allergic rhinitis.  7. Dyslipidemia.  8. Metabolic acidosis secondary to diarrhea, resolved.  9. Chronic pyuria.   CONSULTATIONS:  1. Gastroenterology, Milus Banister, MD.  2. Nephrology, Windy Kalata, M.D.   FOLLOWUP PLANS:  Dr. Burnice Logan next week.  Dr. Ardis Hughs January 9 at 10 a.m.   DISCHARGE MEDICATIONS:  1. Flagyl 250 mg four times a day for 5 days.  2. Flomax 0.4 mg daily.  3. Protonix 40 mg daily.  4. Tylenol 650 mg as needed for pain.  5. Do not resume other medicines until you see Dr. Burnice Logan.   ACTIVITY:  Resume slowly.   DIET:  Low-sodium.   HISTORY:  The patient is a 76 year old male who was admitted after he  developed profuse diarrhea that resulted in increasing weakness.  He was  found to have creatinine of 15.19 with CO2 of 19 and potassium 4.1 in  the emergency room. His BUN was 98.  He was admitted with acute  renal  failure. For the details, please address Dr. Truddie Hidden History and  Physical.  Of note, he was on Cipro for diarrhea previously.   During the course of hospitalization, he was seen by Dr. Ardis Hughs and Dr.  Mercy Moore in consultation.  His ACE inhibitor was held.  Diarrhea was  treated with Flagyl.  His condition has greatly improved.   On the day of discharge, he is feeling well.  He is able to ambulate.  No nausea or vomiting. His blood pressure is 135/78, temperature  97.8,  respirations 18, saturation 94% on room air.  His creatinine today is  6.71. The lungs are clear.  Heart regular.  Abdomen: Soft, nontender.  Lower extremities without edema.  He is alert, oriented, cooperative.   LABORATORY DATA:  Today his sodium was 136, potassium 3.6 creatinine  6.71 (yesterday 8.55, on admission 15.05).  BUN is 48 today, on  admission 92.  Urinalysis yesterday revealed 21-50 wbc's.  Urine cultures were negative.  C. difficile test negative.  Stool tests  for lactoferrin was negative.   KUB normal.      Evie Lacks. Plotnikov, MD  Electronically Signed     AVP/MEDQ  D:  12/29/2006  T:  12/30/2006  Job:  [id number]   cc:   Marletta Lor, MD  [place]  [place] [id number]   Milus Banister, MD  [place],  [phone]   Windy Kalata, M.D.  Fax: [phone]

## 2010-05-31 NOTE — Op Note (Signed)
Alexander Mcdonald, Alexander Mcdonald               ACCOUNT NO.:  [id number]   MEDICAL RECORD NO.:  [id number]          PATIENT TYPE:  AMB   LOCATION:  [facility]                         FACILITY:  [facility]   PHYSICIAN:  Mark C. Karsten Ro, M.D.  DATE OF BIRTH:  06-27-1926   DATE OF PROCEDURE:  02/08/2007  DATE OF DISCHARGE:                               OPERATIVE REPORT   PREOPERATIVE DIAGNOSES:  1. Persistent sterile pyuria.  2. Bladder lesion.   POSTOPERATIVE DIAGNOSES:  1. Persistent sterile pyuria.  2. Bladder lesion.   PROCEDURE:  1. Cystoscopy.  2. Bilateral retrograde pyelograms with interpretation.  3. Bladder biopsy.   SURGEON:  Dr. Kathie Rhodes.   ANESTHESIA:  General.   SPECIMENS:  Cold cup biopsies from the posterior wall of the bladder,  left wall posterior to the left ureteral orifice and urine was sent also  for cytology.   BLOOD LOSS:  None.   DRAINS:  None.   COMPLICATIONS:  None.   INDICATIONS:  The patient is a 77 year old male who has a history of  adenocarcinoma of the prostate but is undergoing watchful waiting for  that.  He was found to have significant pyuria and has a history of  cystitis however, cultures have been negative despite the presence of  pyuria.  Cystoscopy was performed and I found what appeared to be some  possible either inflammation or carcinoma in situ on the posterior wall  of the bladder and have discussed with the patient the need for further  evaluation of both his upper tracts and the bladder with biopsy. I went  over the risks, complications and alternatives.  He understands and  elected to proceed.   DESCRIPTION OF OPERATION:  After informed consent, the patient was  brought to the major OR, placed on the table, administered general  anesthesia and then moved to the dorsal lithotomy position.  His  genitalia was sterilely prepped with Betadine and draped and an official  time-out was then performed.   Cystoscopy was initially performed using  a 22-French rigid cystoscope  and 12 degree lens.  This was inserted in the urethra and directed into  the bladder under direct visualization. The urethra was noted to be  normal.  The prostatic urethra revealed bilobar hypertrophy but no  prostatic lesions were seen.  It was mildly obstructing.  The bladder  was then entered and I noted some mild cobblestoning of the trigonal  region.  The ureteral orifices were of normal configuration and position  and again the areas that appeared slightly redder than the surrounding  mucosa were noted on the posterior wall of the bladder. These were  photographed.   A left retrograde pyelogram was then performed by passing a 6-French  open-ended ureteral catheter through the cystoscope and into the left  distal ureteral orifice.  Under direct fluoroscopy, full strength  contrast was then injected under direct fluoroscopy and it revealed a  normal ureter throughout its length with no filling defects or mass  effect.  The intrarenal collecting system was also noted to be entirely  normal. I then performed a right  retrograde pyelogram in identical  fashion with identical findings and no pathologic findings identified.   I then inserted the cold cup biopsy forceps and obtained a biopsy from  the posterior wall.  I actually obtained biopsies from three  representative areas on the posterior wall and sent this as a single  specimen labeled posterior wall of the bladder biopsy.  I then obtained  a biopsy from a slightly different appearing area just posterior to the  left ureteral orifice and sent this labeled as such.  I then inserted  the Bugbee electrode and fulgurated all four of the biopsy sites and no  bleeding was noted at the end of the procedure.  The left orifice was  well away from the biopsy and subsequent fulguration.  The bladder was  then drained and the patient was awakened and taken to the recovery room  in stable satisfactory condition.   He tolerated the procedure well,  there were no intraoperative complications.  Of note after I initially  inserted the cystoscope in the bladder, urine was drained and this was  sent for cytology.   The patient will be given a prescription for Pyridium 200 mg and written  instructions.  He will be contacted with the results of the biopsy and I  will then at that time determine further follow-up based on those  results.      Mark C. Karsten Ro, M.D.  Electronically Signed     MCO/MEDQ  D:  02/08/2007  T:  02/08/2007  Job:  [id number]

## 2010-05-31 NOTE — Assessment & Plan Note (Signed)
[place]                         GASTROENTEROLOGY OFFICE NOTE   CAIREE, LUMBARD                      MRN:          [id number]  DATE:01/25/2007                            DOB:          April 01, 1926    GI PROBLEM LIST:  Acute diarrheal illness, December 2008, likely  infectious etiology, resolved with Flagyl antibiotics only.  Situation  complicated by acute renal failure with creatinines in the mid-teens.  Stool testing all negative for infection.   INTERVAL HISTORY:  I last saw Mr. Guida when he was hospitalized at  [facility].  He had a six-day stay in the hospital for acute  diarrhea, complicated by renal failure.  His diarrhea resolved very  quickly with Flagyl, so I suspect that this was infectious etiology.  We  deferred on colonoscopy while he was hospitalized, due to his very high  creatinine in the mid teens.  Since then, his creatinine has definitely  trended downward.  I do not believe it is in the normal range yet, but  he says last year his creatinine was in the 3's.  He has had recent  urologic evaluation and they are planning to do a cystoscopy to remove a  potential small bladder tumor.  Currently, he is moving his bowels once  or twice a day, solid.  No nocturnal problems, no bleeding.  When he was  having the dramatic diarrhea, he was having nocturnal symptoms, diarrhea  all day, intermittent bleeding, but not predominantly bloody diarrhea.   CURRENT MEDICINES:  Flomax and Protonix.   PHYSICAL EXAM:  He is 5 feet 5 inches, 151 pounds.  Blood pressure  152/60, pulse 78.  CONSTITUTIONAL:  Generally well-appearing.  ABDOMEN:  Soft, nontender, nondistended.  Normal bowel sounds.  EXTREMITIES:  No lower extremity edema.   ASSESSMENT AND PLAN:  Patient is a 77 year old man with recent resolved  acute diarrheal illness.   I think it is safe now to proceed with full colonoscopy at his soonest  convenience.  I will also  arrange for him to get a basic metabolic  profile today to see where his creatinine is leveling out.  He has never  had a colonoscopy, so this will be for routine colorectal cancer  screening, as well as to check to see if there is any other etiology for  his acute diarrheal illness, such as inflammatory bowel disease, which I  doubt, given that his symptoms resolved on antibiotics alone.     Milus Banister, MD  Electronically Signed    DPJ/MedQ  DD: 01/25/2007  DT: 01/25/2007  Job #: [id number]   cc:   Marletta Lor, MD

## 2010-05-31 NOTE — Consult Note (Signed)
Alexander Mcdonald, Alexander Mcdonald NO.:  [id number]   MEDICAL RECORD NO.:  [id number]          PATIENT TYPE:  INP   LOCATION:  [id number]                         FACILITY:  [facility]   PHYSICIAN:  Windy Kalata, M.D.DATE OF BIRTH:  1926/04/03   DATE OF CONSULTATION:  12/24/2006  DATE OF DISCHARGE:                                 CONSULTATION   REFERRING PHYSICIAN:  Marletta Lor, MD.   REASON FOR CONSULTATION:  Acute renal failure.   HISTORY OF PRESENT ILLNESS:  This 77 year old white male was admitted  yesterday with weakness.  He has had a 3 week bout of diarrhea of  countless stools per day along with poor p.o. intake.  In addition, he  has been on an ACE inhibitor by the way of Monopril.  In the emergency  room, he was found to have a serum creatinine of 15.  On review of his  laboratory, a serum creatinine on June 2006 was 1.5, June 2007 was 1.9  and last week when he saw his primary care physician his serum  creatinine was 1.9.  In addition, he has been taking Aleve 2 tablets a  day but he stopped this approximately a week ago.  A renal ultrasound  done during this hospitalization shows right kidney 12.6 cm, left kidney  at 12.8 cm, no hydronephrosis.  He had pyuria last week on a UA and was  placed on Cipro.  Last Thursday he had a urine culture done by his  urologist while on Cipro which was negative.  His urine output has been  1575 mL since admission after having received approximately 5 liters of  IV fluids.   PAST MEDICAL HISTORY:  1,  hypertension.  1. BPH.  2. Gout.  3. Hyperlipidemia.   ALLERGIES:  None.   CURRENT MEDICATIONS:  1. Flagyl 250 mg daily.  2. Imodium.  3. Protonix 40 mg a day.  4. Flomax 0.4 mg a day.  He was on Cipro, glucosamine, aspirin, Clarinex, Monopril, allopurinol ,  Niacin and Vytorin prior to admission.   SOCIAL HISTORY:  Nonsmoker. He occasionally drinks alcohol.  He is  married and lives with his wife.   FAMILY  HISTORY:  Negative for renal disease.   REVIEW OF SYSTEMS:  Appetite is decreased. He has occasional bouts of  nausea but currently is not nauseated. No shortness of breath.  No chest  pains or chest pressures. Currently no abdominal pain though he does  have crampy pain with bouts of diarrhea. No new arthritic complaints.  He had back pain a few weeks ago for which he was taking the Aleve. He  is unable to localize with there back pain was. No new neuropathic  symptoms.  No new skin rashes.   PHYSICAL EXAM:  Blood pressure 150/80, pulse 84, temperature 97.9.  GENERAL:  A 77 year old white male in no acute stress.  HEENT:  Sclera nonicteric.  His muscles are intact.  NECK:  Reveals flat neck veins, no lymphadenopathy.  LUNGS:  Clear to auscultation.  HEART:  Regular rate and rhythm without murmur, rub or gallop.  ABDOMEN:  Positive bowel sounds, nontender, nondistended.  No  hepatosplenomegaly.  No bruits.  EXTREMITIES:  No clubbing, cyanosis or edema.  SKIN:  Reveals no rashes.  BACK:  Reveals no CVA tenderness.  NEUROLOGIC:  Cranial nerves intact.  Motor and sensory intact.  No  asterixis.   LABORATORY DATA:  Sodium 137, potassium 5.0, bicarb 17, BUN 92,  creatinine 15, phosphorous 7.5, calcium 7.2, albumin 2.3. White count  10.3, no eosinophils seen on peripheral smear. Hemoglobin 10.9, platelet  227,000. Urinalysis too numerous to count white blood cells, 7-10 RBCs.   IMPRESSION:  1. Acute renal failure on chronic kidney disease secondary to      dehydration in face of an ACE inhibitor.  The other concern I have      is this persistent pyuria and whether or not this represents      infection or inflammation (i.e. interstitial nephritis).  Will      await the results of the recent urine cultures, but it concerns me      that he still has significant pyuria even after finishing a course      of Cipro.  2. Metabolic acidosis secondary to diarrhea.  3. Diarrhea.   PLAN:  1.  Will change the IV to 0.25% normal saline with 2 amps of bicarb and      discontinue potassium chloride in the IV fluids. I did discuss with      he and his family the possibility of hemodialysis if his renal      function is not improved in the next few days.  2. Will check urine eosinophils.  3. Check serologies.  4. Check daily serum creatinine.  5. I agree with holding the ACE inhibitor for now.  6. Will follow up in the morning.   Thank you very much for the consult. Will follow the patient with you.           ______________________________  Windy Kalata, M.D.     MTM/MEDQ  D:  12/24/2006  T:  12/25/2006  Job:  [id number]

## 2010-05-31 NOTE — H&P (Signed)
NAMECHAUNCEY, AHLGREN NO.:  [id number]   MEDICAL RECORD NO.:  [id number]          PATIENT TYPE:  EMS   LOCATION:  ED                           FACILITY:  [facility]   PHYSICIAN:  Marletta Lor, MDDATE OF BIRTH:  [birth year]   DATE OF ADMISSION:  12/23/2006  DATE OF DISCHARGE:                              HISTORY & PHYSICAL   CHIEF COMPLAINT:  Weakness, nausea, and vomiting.   HISTORY OF PRESENT ILLNESS:  The patient is a 77 year old gentleman who  was well until approximately 3 weeks ago.  He was initially evaluated in  the office on December 07, 2006, with a one-week history of profuse  diarrhea.  At that time, he was treated symptomatically and was  reevaluated 6 days ago on December 17, 2006.  At that time, he complained  of persistent diarrhea as well as the onset of dysuria and frequency.  A  urinalysis was obtained which revealed pyuria, and he was placed on  Cipro.  Over the past week, he has also been treated with hyoscyamine  for the diarrhea and crampy abdominal pain.  He was seen for a routine  urological evaluation four days prior to admission, and at that time  urine culture was obtained.  The patient also states that a bladder and  renal ultrasound was obtained that was normal.  Over the past week, the  patient has also been placed on Flomax 0.4 mg daily.  The patient states  that he has been urinating small amounts and denies any real  incontinence.  His dysuria has resolved   The patient presented to the emergency department today complaining of  increasing weakness.  His oral intake has been poor.  He describes  intermittent nausea and vomiting and was awakened at 3 a.m. on the day  of admission with active emesis.  On presentation to the emergency  department, the patient was afebrile and appeared clinically stable.  Chemistries revealed a BUN of 98, and a creatinine of 15.19, CO2 content  was 19, potassium level normal at 4.1.  In the  emergency setting, the  patient was felt clinically to have bladder distention, and he was able  to void on request 200 mL.  His postvoid residual volume was 180 mL.  The patient is now admitted for further evaluation and treatment of his  acute renal failure.   PAST MEDICAL HISTORY:  1. As mentioned the patient, has had recent dysentery for the past      three weeks,  2. Medical problems include hypercholesterolemia.  3. BPH.  4. History of gout.  5. He has been treated for recent urinary tract infection.  6. He has a history also of seasonal allergic rhinitis.  7. Hypertension.   PRESENT MEDICAL REGIMEN:  1. Cipro.  2. Hyoscyamine.  3. Aspirin 325 mg daily.  4. Clarinex 5 mg daily.  5. Monopril 20 mg daily.  6. Allopurinol 300 mg daily.  7. Niacin 500 mg daily.  8. Vytorin 20.   SOCIAL HISTORY:  He lives with his wife, and has a daughter living in  the  [place] area.  He is a non-drug user, nonsmoker, but does drink  alcohol.   PHYSICAL EXAMINATION:  VITAL SIGNS:  Blood pressure is 130/82,  temperature 96.8, pulse 90, respiratory rate 16 to.  GENERAL:  Revealed an elderly white gentleman who appeared in no acute  distress.  He seemed slightly pale.  HEAD/NECK:  Revealed no signs of trauma.  Pupillary responses were  normal.  Conjunctiva slightly injected.  Oropharynx appeared to be well  hydrated and was quite moist.  Dentures in place.  Neck revealed a  surgical scar involving the left neck area.  No bruits were appreciated.  CHEST:  Clear.  CARDIOVASCULAR:  Revealed normal S1 and S2.  There was a grade 99991111  systolic murmur.  ABDOMEN:  Soft and large and nontender.  There was suggestion of some  mild epigastric tenderness.  On initial exam, the patient was felt to  perhaps have a slightly distended bladder.  Bilateral femoral bruits  were noted.  EXTREMITIES:  Revealed no edema.  Peripheral pulses were full, except  for a diminished right dorsalis pedis pulse.   RECTAL:  Revealed light brown stool.  There is little stool in the  rectal vault.  It seemed to be trace Hematest positive   IMPRESSION:  Acute renal failure, most likely related to prerenal  factors, with volume depletion daily aspirin, and ACE inhibitor all  contributing factors.  Also concerned about a component of obstructive  uropathy, with the patient's history of BPH, recent UTI, and use of  anticholinergics.   DISPOSITION:  Will admit the patient to the hospital and carefully  rehydrate.  Electrolytes will be monitored carefully.  An abdominal  ultrasound will be obtained to rule out hydronephrosis and also assess  renal size.  A urine culture will also be obtained.      Marletta Lor, MD  Electronically Signed     PFK/MEDQ  D:  12/23/2006  T:  12/24/2006  Job:  [id number]

## 2010-06-07 ENCOUNTER — Other Ambulatory Visit: Payer: Self-pay | Admitting: Internal Medicine

## 2010-06-07 DIAGNOSIS — I6529 Occlusion and stenosis of unspecified carotid artery: Secondary | ICD-10-CM

## 2010-06-08 ENCOUNTER — Other Ambulatory Visit: Payer: Self-pay | Admitting: *Deleted

## 2010-06-08 ENCOUNTER — Encounter (INDEPENDENT_AMBULATORY_CARE_PROVIDER_SITE_OTHER): Payer: Medicare Other | Admitting: *Deleted

## 2010-06-08 ENCOUNTER — Encounter: Payer: Self-pay | Admitting: Internal Medicine

## 2010-06-08 DIAGNOSIS — I6529 Occlusion and stenosis of unspecified carotid artery: Secondary | ICD-10-CM

## 2010-06-09 ENCOUNTER — Ambulatory Visit (INDEPENDENT_AMBULATORY_CARE_PROVIDER_SITE_OTHER): Payer: Medicare Other | Admitting: Internal Medicine

## 2010-06-09 ENCOUNTER — Encounter: Payer: Self-pay | Admitting: Internal Medicine

## 2010-06-09 DIAGNOSIS — I10 Essential (primary) hypertension: Secondary | ICD-10-CM

## 2010-06-09 DIAGNOSIS — I6529 Occlusion and stenosis of unspecified carotid artery: Secondary | ICD-10-CM

## 2010-06-09 DIAGNOSIS — E7800 Pure hypercholesterolemia, unspecified: Secondary | ICD-10-CM

## 2010-06-09 NOTE — Patient Instructions (Signed)
Limit your sodium (Salt) intake    It is important that you exercise regularly, at least 20 minutes 3 to 4 times per week.  If you develop chest pain or shortness of breath seek  medical attention.  Return in 6 months for follow-up  

## 2010-06-09 NOTE — Progress Notes (Signed)
  Subjective:    Patient ID: Alexander Mcdonald, male    DOB: 1926-08-16, 77 y.o.   MRN: [id number]  HPI  77 year old patient who is in today for followup he has a history of carotid artery disease and underwent a followup duplex examination yesterday. He denies any focal neurological symptoms. He has a history of hypertension and dyslipidemia. Laboratory studies from 6 months ago reviewed. Cholesterol was well controlled. He denies any cardiopulmonary complaints. He has a history of depression which has been stable. There is a history of gout that has been asymptomatic.    Review of Systems  Constitutional: Negative for fever, chills, appetite change and fatigue.  HENT: Negative for hearing loss, ear pain, congestion, sore throat, trouble swallowing, neck stiffness, dental problem, voice change and tinnitus.   Eyes: Negative for pain, discharge and visual disturbance.  Respiratory: Negative for cough, chest tightness, wheezing and stridor.   Cardiovascular: Negative for chest pain, palpitations and leg swelling.  Gastrointestinal: Negative for nausea, vomiting, abdominal pain, diarrhea, constipation, blood in stool and abdominal distention.  Genitourinary: Negative for urgency, hematuria, flank pain, discharge, difficulty urinating and genital sores.  Musculoskeletal: Negative for myalgias, back pain, joint swelling, arthralgias and gait problem.  Skin: Negative for rash.  Neurological: Negative for dizziness, syncope, speech difficulty, weakness, numbness and headaches.  Hematological: Negative for adenopathy. Does not bruise/bleed easily.  Psychiatric/Behavioral: Negative for behavioral problems and dysphoric mood. The patient is not nervous/anxious.        Objective:   Physical Exam  Constitutional: He is oriented to person, place, and time. He appears well-developed.  HENT:  Head: Normocephalic.  Right Ear: External ear normal.  Left Ear: External ear normal.  Eyes: Conjunctivae and EOM  are normal.  Neck: Normal range of motion.       A right carotid bruit versus transmitted murmur  Cardiovascular: Normal rate.   Murmur heard.      Grade [id number] systolic murmur  Pulmonary/Chest: Breath sounds normal.  Abdominal: Bowel sounds are normal.  Musculoskeletal: Normal range of motion. He exhibits no edema and no tenderness.  Neurological: He is alert and oriented to person, place, and time.  Psychiatric: He has a normal mood and affect. His behavior is normal.          Assessment & Plan:   Hypertension well controlled Dyslipidemia stable Carotid artery disease. Will followup carotid artery duplex study  Recheck 6 months Medical regimen unchanged

## 2010-06-10 ENCOUNTER — Encounter: Payer: Self-pay | Admitting: Internal Medicine

## 2010-08-04 ENCOUNTER — Other Ambulatory Visit: Payer: Self-pay

## 2010-08-04 MED ORDER — TAMSULOSIN HCL 0.4 MG PO CAPS: 0.4000 mg | ORAL_CAPSULE | Freq: Every day | ORAL | Status: DC

## 2010-10-06 LAB — POCT I-STAT 4, (NA,K, GLUC, HGB,HCT)
Glucose, Bld: 106 — ABNORMAL HIGH
HCT: 34 — ABNORMAL LOW
Hemoglobin: 11.6 — ABNORMAL LOW
Operator id: 118191
Potassium: 4.4
Sodium: 139

## 2010-10-07 ENCOUNTER — Ambulatory Visit (INDEPENDENT_AMBULATORY_CARE_PROVIDER_SITE_OTHER): Payer: Medicare Other | Admitting: Internal Medicine

## 2010-10-07 DIAGNOSIS — Z23 Encounter for immunization: Secondary | ICD-10-CM

## 2010-10-07 DIAGNOSIS — Z0000 Encounter for general adult medical examination without abnormal findings: Secondary | ICD-10-CM

## 2010-10-24 LAB — URINE MICROSCOPIC-ADD ON

## 2010-10-24 LAB — URINALYSIS, ROUTINE W REFLEX MICROSCOPIC
Bilirubin Urine: NEGATIVE
Bilirubin Urine: NEGATIVE
Bilirubin Urine: NEGATIVE
Glucose, UA: 100 — AB
Glucose, UA: NEGATIVE
Glucose, UA: NEGATIVE
Ketones, ur: NEGATIVE
Ketones, ur: NEGATIVE
Ketones, ur: NEGATIVE
Nitrite: NEGATIVE
Nitrite: NEGATIVE
Nitrite: NEGATIVE
Protein, ur: NEGATIVE
Protein, ur: NEGATIVE
Protein, ur: NEGATIVE
Specific Gravity, Urine: 1.005
Specific Gravity, Urine: 1.007
Specific Gravity, Urine: 1.008
Urobilinogen, UA: 0.2
Urobilinogen, UA: 0.2
Urobilinogen, UA: 0.2
pH: 7
pH: 7.5
pH: 7.5

## 2010-10-24 LAB — COMPREHENSIVE METABOLIC PANEL
ALT: 54 — ABNORMAL HIGH
AST: 30
Albumin: 2.8 — ABNORMAL LOW
Alkaline Phosphatase: 76
BUN: 98 — ABNORMAL HIGH
CO2: 19
Calcium: 8.3 — ABNORMAL LOW
Chloride: 97
Creatinine, Ser: 15.19 — ABNORMAL HIGH
GFR calc Af Amer: 4 — ABNORMAL LOW
GFR calc non Af Amer: 3 — ABNORMAL LOW
Glucose, Bld: 140 — ABNORMAL HIGH
Potassium: 4.1
Sodium: 132 — ABNORMAL LOW
Total Bilirubin: 1.1
Total Protein: 6.1

## 2010-10-24 LAB — RENAL FUNCTION PANEL
Albumin: 2.1 — ABNORMAL LOW
Albumin: 2.3 — ABNORMAL LOW
Albumin: 2.5 — ABNORMAL LOW
Albumin: 2.6 — ABNORMAL LOW
Albumin: 2.7 — ABNORMAL LOW
BUN: 56 — ABNORMAL HIGH
BUN: 66 — ABNORMAL HIGH
BUN: 78 — ABNORMAL HIGH
BUN: 87 — ABNORMAL HIGH
BUN: 92 — ABNORMAL HIGH
CO2: 17 — ABNORMAL LOW
CO2: 21
CO2: 27
CO2: 30
CO2: 32
Calcium: 6.6 — ABNORMAL LOW
Calcium: 6.9 — ABNORMAL LOW
Calcium: 6.9 — ABNORMAL LOW
Calcium: 7 — ABNORMAL LOW
Calcium: 7.2 — ABNORMAL LOW
Chloride: 106
Chloride: 108
Chloride: 94 — ABNORMAL LOW
Chloride: 94 — ABNORMAL LOW
Chloride: 99
Creatinine, Ser: 11.26 — ABNORMAL HIGH
Creatinine, Ser: 12.74 — ABNORMAL HIGH
Creatinine, Ser: 13.95 — ABNORMAL HIGH
Creatinine, Ser: 15.05 — ABNORMAL HIGH
Creatinine, Ser: 8.55 — ABNORMAL HIGH
GFR calc Af Amer: 4 — ABNORMAL LOW
GFR calc Af Amer: 4 — ABNORMAL LOW
GFR calc Af Amer: 5 — ABNORMAL LOW
GFR calc Af Amer: 5 — ABNORMAL LOW
GFR calc Af Amer: 7 — ABNORMAL LOW
GFR calc non Af Amer: 3 — ABNORMAL LOW
GFR calc non Af Amer: 3 — ABNORMAL LOW
GFR calc non Af Amer: 4 — ABNORMAL LOW
GFR calc non Af Amer: 4 — ABNORMAL LOW
GFR calc non Af Amer: 6 — ABNORMAL LOW
Glucose, Bld: 103 — ABNORMAL HIGH
Glucose, Bld: 110 — ABNORMAL HIGH
Glucose, Bld: 119 — ABNORMAL HIGH
Glucose, Bld: 120 — ABNORMAL HIGH
Glucose, Bld: 146 — ABNORMAL HIGH
Phosphorus: 5.3 — ABNORMAL HIGH
Phosphorus: 7.5 — ABNORMAL HIGH
Phosphorus: 7.8 — ABNORMAL HIGH
Phosphorus: 7.9 — ABNORMAL HIGH
Phosphorus: 8.1 — ABNORMAL HIGH
Potassium: 3.1 — ABNORMAL LOW
Potassium: 3.6
Potassium: 3.6
Potassium: 4.1
Potassium: 5
Sodium: 136
Sodium: 137
Sodium: 138
Sodium: 141
Sodium: 143

## 2010-10-24 LAB — BASIC METABOLIC PANEL
BUN: 48 — ABNORMAL HIGH
BUN: 94 — ABNORMAL HIGH
BUN: 96 — ABNORMAL HIGH
CO2: 17 — ABNORMAL LOW
CO2: 17 — ABNORMAL LOW
CO2: 23
Calcium: 7.2 — ABNORMAL LOW
Calcium: 7.3 — ABNORMAL LOW
Calcium: 7.4 — ABNORMAL LOW
Chloride: 100
Chloride: 104
Chloride: 105
Creatinine, Ser: 14.69 — ABNORMAL HIGH
Creatinine, Ser: 15.01 — ABNORMAL HIGH
Creatinine, Ser: 6.71 — ABNORMAL HIGH
GFR calc Af Amer: 10 — ABNORMAL LOW
GFR calc Af Amer: 4 — ABNORMAL LOW
GFR calc Af Amer: 4 — ABNORMAL LOW
GFR calc non Af Amer: 3 — ABNORMAL LOW
GFR calc non Af Amer: 3 — ABNORMAL LOW
GFR calc non Af Amer: 8 — ABNORMAL LOW
Glucose, Bld: 112 — ABNORMAL HIGH
Glucose, Bld: 112 — ABNORMAL HIGH
Glucose, Bld: 169 — ABNORMAL HIGH
Potassium: 3.6
Potassium: 4.3
Potassium: 4.6
Sodium: 133 — ABNORMAL LOW
Sodium: 136
Sodium: 137

## 2010-10-24 LAB — CBC
HCT: 29.6 — ABNORMAL LOW
HCT: 30.6 — ABNORMAL LOW
HCT: 33.1 — ABNORMAL LOW
HCT: 33.9 — ABNORMAL LOW
Hemoglobin: 10.1 — ABNORMAL LOW
Hemoglobin: 10.9 — ABNORMAL LOW
Hemoglobin: 11.7 — ABNORMAL LOW
Hemoglobin: 11.8 — ABNORMAL LOW
MCHC: 34.3
MCHC: 34.8
MCHC: 35.3
MCHC: 35.5
MCV: 89.3
MCV: 89.5
MCV: 90
MCV: 90.7
Platelets: 190
Platelets: 204
Platelets: 227
Platelets: 235
RBC: 3.26 — ABNORMAL LOW
RBC: 3.43 — ABNORMAL LOW
RBC: 3.7 — ABNORMAL LOW
RBC: 3.76 — ABNORMAL LOW
RDW: 12.5
RDW: 13.4
RDW: 13.5
RDW: 13.6
WBC: 10.3
WBC: 7.8
WBC: 8.4
WBC: 9.5

## 2010-10-24 LAB — PROTEIN ELECTROPHORESIS, SERUM
Albumin ELP: 49.3 — ABNORMAL LOW
Alpha-1-Globulin: 8.2 — ABNORMAL HIGH
Alpha-2-Globulin: 17.6 — ABNORMAL HIGH
Beta 2: 5.8
Beta Globulin: 4.7
Gamma Globulin: 14.4
M-Spike, %: NOT DETECTED
Total Protein ELP: 5.6 — ABNORMAL LOW

## 2010-10-24 LAB — CLOSTRIDIUM DIFFICILE EIA: C difficile Toxins A+B, EIA: NEGATIVE

## 2010-10-24 LAB — CREATININE, URINE, RANDOM: Creatinine, Urine: 33.4

## 2010-10-24 LAB — DIFFERENTIAL
Basophils Absolute: 0
Basophils Relative: 0
Eosinophils Absolute: 0 — ABNORMAL LOW
Eosinophils Relative: 0
Lymphocytes Relative: 11 — ABNORMAL LOW
Lymphs Abs: 1
Monocytes Absolute: 0.6
Monocytes Relative: 6
Neutro Abs: 7.8 — ABNORMAL HIGH
Neutrophils Relative %: 83 — ABNORMAL HIGH

## 2010-10-24 LAB — LIPASE, BLOOD: Lipase: 92 — ABNORMAL HIGH

## 2010-10-24 LAB — OCCULT BLOOD X 1 CARD TO LAB, STOOL
Fecal Occult Bld: NEGATIVE
Fecal Occult Bld: NEGATIVE

## 2010-10-24 LAB — OVA AND PARASITE EXAMINATION: Ova and parasites: NONE SEEN

## 2010-10-24 LAB — MAGNESIUM: Magnesium: 1.4 — ABNORMAL LOW

## 2010-10-24 LAB — OSMOLALITY, URINE: Osmolality, Ur: 265 — ABNORMAL LOW

## 2010-10-24 LAB — URINE CULTURE
Colony Count: NO GROWTH
Culture: NO GROWTH

## 2010-10-24 LAB — C4 COMPLEMENT: Complement C4, Body Fluid: 20

## 2010-10-24 LAB — SEDIMENTATION RATE: Sed Rate: 60 — ABNORMAL HIGH

## 2010-10-24 LAB — STOOL CULTURE

## 2010-10-24 LAB — FECAL LACTOFERRIN, QUANT: Fecal Lactoferrin: NEGATIVE

## 2010-10-24 LAB — SODIUM, URINE, RANDOM: Sodium, Ur: 96

## 2010-10-24 LAB — ANA: Anti Nuclear Antibody(ANA): NEGATIVE

## 2010-10-24 LAB — FOLATE: Folate: 12.7

## 2010-10-24 LAB — OSMOLALITY: Osmolality: 306 — ABNORMAL HIGH

## 2010-10-24 LAB — IRON: Iron: 51

## 2010-10-24 LAB — VITAMIN B12: Vitamin B-12: 722 (ref 211–911)

## 2010-10-24 LAB — C3 COMPLEMENT: C3 Complement: 98

## 2010-10-24 LAB — FERRITIN: Ferritin: 792 — ABNORMAL HIGH (ref 22–322)

## 2010-11-21 DIAGNOSIS — H44511 Absolute glaucoma, right eye: Secondary | ICD-10-CM | POA: Insufficient documentation

## 2010-11-22 DIAGNOSIS — H35352 Cystoid macular degeneration, left eye: Secondary | ICD-10-CM | POA: Insufficient documentation

## 2010-12-05 ENCOUNTER — Telehealth: Payer: Self-pay | Admitting: Internal Medicine

## 2010-12-05 ENCOUNTER — Encounter: Payer: Self-pay | Admitting: Internal Medicine

## 2010-12-05 ENCOUNTER — Ambulatory Visit (INDEPENDENT_AMBULATORY_CARE_PROVIDER_SITE_OTHER): Payer: Medicare Other | Admitting: Internal Medicine

## 2010-12-05 ENCOUNTER — Telehealth: Payer: Self-pay

## 2010-12-05 VITALS — BP 150/90 | HR 66 | Temp 98.0°F | Resp 16 | Ht 65.5 in | Wt 169.0 lb

## 2010-12-05 DIAGNOSIS — Z9911 Dependence on respirator [ventilator] status: Secondary | ICD-10-CM

## 2010-12-05 DIAGNOSIS — E7800 Pure hypercholesterolemia, unspecified: Secondary | ICD-10-CM

## 2010-12-05 DIAGNOSIS — M109 Gout, unspecified: Secondary | ICD-10-CM

## 2010-12-05 DIAGNOSIS — I6529 Occlusion and stenosis of unspecified carotid artery: Secondary | ICD-10-CM

## 2010-12-05 DIAGNOSIS — Z0000 Encounter for general adult medical examination without abnormal findings: Secondary | ICD-10-CM

## 2010-12-05 DIAGNOSIS — F329 Major depressive disorder, single episode, unspecified: Secondary | ICD-10-CM

## 2010-12-05 DIAGNOSIS — Z8546 Personal history of malignant neoplasm of prostate: Secondary | ICD-10-CM

## 2010-12-05 DIAGNOSIS — Z23 Encounter for immunization: Secondary | ICD-10-CM

## 2010-12-05 DIAGNOSIS — I10 Essential (primary) hypertension: Secondary | ICD-10-CM

## 2010-12-05 LAB — CBC WITH DIFFERENTIAL/PLATELET
Basophils Absolute: 0 10*3/uL (ref 0.0–0.1)
Basophils Relative: 0.6 % (ref 0.0–3.0)
Eosinophils Absolute: 0.2 10*3/uL (ref 0.0–0.7)
Eosinophils Relative: 2.6 % (ref 0.0–5.0)
HCT: 42.3 % (ref 39.0–52.0)
Hemoglobin: 14.4 g/dL (ref 13.0–17.0)
Lymphocytes Relative: 23 % (ref 12.0–46.0)
Lymphs Abs: 1.6 10*3/uL (ref 0.7–4.0)
MCHC: 34.1 g/dL (ref 30.0–36.0)
MCV: 90.1 fl (ref 78.0–100.0)
Monocytes Absolute: 0.5 10*3/uL (ref 0.1–1.0)
Monocytes Relative: 7.9 % (ref 3.0–12.0)
Neutro Abs: 4.6 10*3/uL (ref 1.4–7.7)
Neutrophils Relative %: 65.9 % (ref 43.0–77.0)
Platelets: 161 10*3/uL (ref 150.0–400.0)
RBC: 4.69 Mil/uL (ref 4.22–5.81)
RDW: 14.5 % (ref 11.5–14.6)
WBC: 6.9 10*3/uL (ref 4.5–10.5)

## 2010-12-05 LAB — LIPID PANEL
Cholesterol: 149 mg/dL (ref 0–200)
HDL: 46.3 mg/dL (ref >39.00)
Total CHOL/HDL Ratio: 3
Triglycerides: 210 mg/dL — ABNORMAL HIGH (ref 0.0–149.0)
VLDL: 42 mg/dL — ABNORMAL HIGH (ref 0.0–40.0)

## 2010-12-05 LAB — COMPREHENSIVE METABOLIC PANEL
ALT: 24 U/L (ref 0–53)
AST: 29 U/L (ref 0–37)
Albumin: 4.1 g/dL (ref 3.5–5.2)
Alkaline Phosphatase: 60 U/L (ref 39–117)
BUN: 28 mg/dL — ABNORMAL HIGH (ref 6–23)
CO2: 26 mEq/L (ref 19–32)
Calcium: 9 mg/dL (ref 8.4–10.5)
Chloride: 107 mEq/L (ref 96–112)
Creatinine, Ser: 1.7 mg/dL — ABNORMAL HIGH (ref 0.4–1.5)
GFR: 42.61 mL/min — ABNORMAL LOW (ref >60.00)
Glucose, Bld: 96 mg/dL (ref 70–99)
Potassium: 5 mEq/L (ref 3.5–5.1)
Sodium: 142 mEq/L (ref 135–145)
Total Bilirubin: 0.9 mg/dL (ref 0.3–1.2)
Total Protein: 7.3 g/dL (ref 6.0–8.3)

## 2010-12-05 LAB — TSH: TSH: 3.17 u[IU]/mL (ref 0.35–5.50)

## 2010-12-05 LAB — LDL CHOLESTEROL, DIRECT: Direct LDL: 80.6 mg/dL

## 2010-12-05 MED ORDER — TAMSULOSIN HCL 0.4 MG PO CAPS: 0.4000 mg | ORAL_CAPSULE | Freq: Every day | ORAL | Status: DC

## 2010-12-05 MED ORDER — MONTELUKAST SODIUM 10 MG PO TABS: 10.0000 mg | ORAL_TABLET | Freq: Every day | ORAL | Status: DC

## 2010-12-05 MED ORDER — PANTOPRAZOLE SODIUM 40 MG PO TBEC: 40.0000 mg | DELAYED_RELEASE_TABLET | Freq: Every day | ORAL | Status: DC

## 2010-12-05 MED ORDER — SIMVASTATIN 20 MG PO TABS: 20.0000 mg | ORAL_TABLET | Freq: Every day | ORAL | Status: DC

## 2010-12-05 MED ORDER — FOSINOPRIL SODIUM 10 MG PO TABS: 10.0000 mg | ORAL_TABLET | Freq: Every day | ORAL | Status: DC

## 2010-12-05 NOTE — Telephone Encounter (Signed)
Spoke with pt - discuss med list - corrections were made -

## 2010-12-05 NOTE — Telephone Encounter (Signed)
Pt has questions regarding  His medications please contact

## 2010-12-05 NOTE — Patient Instructions (Signed)
It is important that you exercise regularly, at least 20 minutes 3 to 4 times per week.  If you develop chest pain or shortness of breath seek  medical attention.  Limit your sodium (Salt) intake  Please check your blood pressure on a regular basis.  If it is consistently greater than 150/90, please make an office appointment.  Return in 6 months for follow-up  

## 2010-12-05 NOTE — Progress Notes (Signed)
Subjective:    Patient ID: Alexander Mcdonald, male    DOB: [birth year], 77 y.o.   MRN: [id number]  HPI  ICC: cpx- doing ok.  History of Present Illness:   77 -year-old patient who is seen today for a wellness exam. He has treated in hypertension, as well as dyslipidemia. He has a history of prostate cancer, followed by urology. He is followed closely by ophthalmology due to glaucoma. 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   Here for Medicare AWV:   1. Risk factors based on Past M, S, F history: cardiovascular risk factors include hypertension, and hypercholesterolemia  2. Physical Activities: remains very active physically, although limited by partial blindness  3. Depression/mood: history of depression, which has been stable. No current treatment  4. Hearing: no deficits  5. ADL's: independent in all aspects of daily living  6. Fall Risk: moderate due to visual deficits  7. Home Safety: no problems identified  8. Height, weight, &visual acuity:height and weight stable. Light perception only involving the right eye  9. Counseling: heart healthy diet regular. Exercise encouraged  10. Labs ordered based on risk factors: laboratory profile, including lipid panel be reviewed  11. Referral Coordination- will follow-up with ophthalmology and urology  12. Care Plan- heart healthy diet regular exercise. All encouraged modest weight loss encouraged  13. Cognitive Assessment- alert and oriented normal affect. No history of memory dysfunction, handles all executive functioning   Allergies (verified):  No Known Drug Allergies   Past History:  Past Medical History:  Reviewed history from 08/16/2009 and no changes required.  Prostate cancer, hx of  Depression  Gout  Hypertension  glaucoma  mild renal insufficiency  history of acute renal failure   Past Surgical History:  Reviewed history from 06/15/2009 and no changes required.  Carotid endarterectomy  diagnosed with  prostate cancer in 2002. He was Gleason 6 involve the right middle lobe  Cataract extraction right eye 2009  colonoscopy June 2010  carotid artery Doppler ultrasound, April 2010, 5-11   Family History:  Reviewed history from 07/16/2008 and no changes required.  Fam hx Leukemia  Family History Hypertension  both parents died at 16 brother may have had coronary artery disease. Sister died of leukemia  two brothers, 3 sisters   Social History:  Reviewed history from 07/25/2006 and no changes required.  Married  Former Smoker  Alcohol use-yes    Past Medical History  Diagnosis Date  . ACUTE RENAL FAILURE W/LESION OF TUBULAR NECROSIS 01/04/2007  . CAROTID ARTERY DISEASE 06/01/2009  . DEPRESSION 07/25/2006  . DIVERTICULOSIS OF COLON 02/01/2007  . GOUT 07/25/2006  . HEMORRHOID, THROMBOSED 02/15/2009  . HYPERCHOLESTEROLEMIA 07/25/2006  . HYPERTENSION 07/25/2006  . PROSTATE CANCER, HX OF 07/25/2006  . Rosacea 09/28/2008  . WEIGHT LOSS 09/01/2009  . Glaucoma     History   Social History  . Marital Status: Married    Spouse Name: N/A    Number of Children: N/A  . Years of Education: N/A   Occupational History  . Not on file.   Social History Main Topics  . Smoking status: Former Smoker    Quit date: 01/17/1988  . Smokeless tobacco: Never Used  . Alcohol Use: Yes  . Drug Use: No  . Sexually Active: Not on file   Other Topics Concern  . Not on file   Social History Narrative  . No narrative on file    Past Surgical History  Procedure Date  . Carotid  endarterectomy   . Cataract extraction     No family history on file.  No Known Allergies  Current Outpatient Prescriptions on File Prior to Visit  Medication Sig Dispense Refill  . aspirin 325 MG tablet Take 325 mg by mouth daily.        Marland Kitchen esomeprazole (NEXIUM) 40 MG capsule Take 40 mg by mouth daily before breakfast.        . ezetimibe-simvastatin (VYTORIN) 10-20 MG per tablet Take 1 tablet by mouth daily.        .  promethazine (PHENERGAN) 25 MG tablet Take 25 mg by mouth every 6 (six) hours as needed.        . timolol (BETIMOL) 0.5 % ophthalmic solution 1 drop 2 (two) times daily.        . travoprost, benzalkonium, (TRAVATAN) 0.004 % ophthalmic solution 1 drop at bedtime.          BP 150/90  Pulse 66  Temp(Src) 98 F (36.7 C) (Oral)  Resp 16  Ht 5' 5.5" (1.664 m)  Wt 169 lb (76.658 kg)  BMI 27.70 kg/m2  SpO2 97%    Review of Systems  Constitutional: Negative for fever, chills, activity change, appetite change and fatigue.  HENT: Negative for hearing loss, ear pain, congestion, rhinorrhea, sneezing, mouth sores, trouble swallowing, neck pain, neck stiffness, dental problem, voice change, sinus pressure and tinnitus.   Eyes: Negative for photophobia, pain, redness and visual disturbance.  Respiratory: Negative for apnea, cough, choking, chest tightness, shortness of breath and wheezing.   Cardiovascular: Negative for chest pain, palpitations and leg swelling.  Gastrointestinal: Negative for nausea, vomiting, abdominal pain, diarrhea, constipation, blood in stool, abdominal distention, anal bleeding and rectal pain.  Genitourinary: Negative for dysuria, urgency, frequency, hematuria, flank pain, decreased urine volume, discharge, penile swelling, scrotal swelling, difficulty urinating, genital sores and testicular pain.  Musculoskeletal: Negative for myalgias, back pain, joint swelling, arthralgias and gait problem.  Skin: Negative for color change, rash and wound.  Neurological: Negative for dizziness, tremors, seizures, syncope, facial asymmetry, speech difficulty, weakness, light-headedness, numbness and headaches.  Hematological: Negative for adenopathy. Does not bruise/bleed easily.  Psychiatric/Behavioral: Negative for suicidal ideas, hallucinations, behavioral problems, confusion, sleep disturbance, self-injury, dysphoric mood, decreased concentration and agitation. The patient is not  nervous/anxious.        Objective:   Physical Exam  Constitutional: He appears well-developed and well-nourished.  HENT:  Head: Normocephalic and atraumatic.  Right Ear: External ear normal.  Left Ear: External ear normal.  Nose: Nose normal.  Mouth/Throat: Oropharynx is clear and moist.       Dentures in place  Eyes: Conjunctivae and EOM are normal. Pupils are equal, round, and reactive to light. No scleral icterus.       Light perception only right eye  Neck: Normal range of motion. Neck supple. No JVD present. No thyromegaly present.       Left carotid endarterectomy scar  Cardiovascular: Regular rhythm and intact distal pulses.  Exam reveals no gallop and no friction rub.   Murmur heard. Pulmonary/Chest: Effort normal and breath sounds normal. He exhibits no tenderness.  Abdominal: Soft. Bowel sounds are normal. He exhibits no distension and no mass. There is no tenderness.  Genitourinary: Penis normal.  Musculoskeletal: Normal range of motion. He exhibits no edema and no tenderness.  Lymphadenopathy:    He has no cervical adenopathy.  Neurological: He is alert. He has normal reflexes. No cranial nerve deficit. Coordination normal.  Skin: Skin is warm  and dry. No rash noted.  Psychiatric: He has a normal mood and affect. His behavior is normal.          Assessment & Plan:     preventive health examination Hypertension well controlled Dyslipidemia Carotid artery disease status post left carotid endarterectomy stable Prostate cancer. Follow up with urology in January of next year

## 2010-12-05 NOTE — Progress Notes (Signed)
  Subjective:    Patient ID: Alexander Mcdonald, male    DOB: 1926-08-25, 77 y.o.   MRN: [id number]  HPI  History of Present Illness:  77 year old patient is seen today for a comprehensive evaluation. Medical problems include hypertension, hyperuricemia, hypercholesterolemia, and a history of gout. He has glaucoma and a history of clinical depression. He is followed by urology for prostate cancer, diagnosed in 2002. He is treated hypertension, which has been stable. He has had a colonoscopy earlier this year. He is status post left carotid endarterectomy and a follow-up of carotid Doppler ultrasound was negative in April of this year. He denies any cardiopulmonary complaints. No history of gout. He is scheduled for urological follow-up soon  Allergies:  No Known Drug Allergies  Past History:  Past Medical History:  Reviewed history from 02/11/2007 and no changes required.  Prostate cancer, hx of  Depression  Gout  Hypertension  Borderline glaucoma  mild renal insufficiency  history of acute renal failure  Past Surgical History:  Carotid endarterectomy  diagnosed with prostate cancer in 2002. He was Gleason 6 involve the right middle lobe  Cataract extraction right eye 2009  colonoscopy June 2010  carotid artery Doppler ultrasound, April 2010  Family History:  Reviewed history from 08/09/2006 and no changes required.  Fam hx Leukemia  Family History Hypertension  both parents died at 82 brother may have had coronary artery disease. Sister died of leukemia  two brothers, 3 sisters  Social History:  Reviewed history from 07/25/2006 and no changes required.  Married  Former Smoker  Alcohol use-yes    Review of Systems     Objective:   Physical Exam        Assessment & Plan:

## 2010-12-19 ENCOUNTER — Other Ambulatory Visit: Payer: Self-pay | Admitting: Internal Medicine

## 2010-12-27 DIAGNOSIS — H3530 Unspecified macular degeneration: Secondary | ICD-10-CM | POA: Insufficient documentation

## 2011-01-06 ENCOUNTER — Other Ambulatory Visit: Payer: Self-pay | Admitting: Internal Medicine

## 2011-02-09 DIAGNOSIS — C61 Malignant neoplasm of prostate: Secondary | ICD-10-CM | POA: Diagnosis not present

## 2011-02-15 DIAGNOSIS — C61 Malignant neoplasm of prostate: Secondary | ICD-10-CM | POA: Diagnosis not present

## 2011-02-15 DIAGNOSIS — N401 Enlarged prostate with lower urinary tract symptoms: Secondary | ICD-10-CM | POA: Diagnosis not present

## 2011-02-27 DIAGNOSIS — H4011X0 Primary open-angle glaucoma, stage unspecified: Secondary | ICD-10-CM | POA: Diagnosis not present

## 2011-02-27 DIAGNOSIS — H35039 Hypertensive retinopathy, unspecified eye: Secondary | ICD-10-CM | POA: Diagnosis not present

## 2011-02-27 DIAGNOSIS — H348392 Tributary (branch) retinal vein occlusion, unspecified eye, stable: Secondary | ICD-10-CM | POA: Diagnosis not present

## 2011-03-03 DIAGNOSIS — H348192 Central retinal vein occlusion, unspecified eye, stable: Secondary | ICD-10-CM | POA: Diagnosis not present

## 2011-03-03 DIAGNOSIS — H35359 Cystoid macular degeneration, unspecified eye: Secondary | ICD-10-CM | POA: Diagnosis not present

## 2011-03-03 DIAGNOSIS — H348392 Tributary (branch) retinal vein occlusion, unspecified eye, stable: Secondary | ICD-10-CM | POA: Diagnosis not present

## 2011-03-03 DIAGNOSIS — H3530 Unspecified macular degeneration: Secondary | ICD-10-CM | POA: Diagnosis not present

## 2011-03-09 ENCOUNTER — Ambulatory Visit (INDEPENDENT_AMBULATORY_CARE_PROVIDER_SITE_OTHER): Payer: Medicare Other | Admitting: Internal Medicine

## 2011-03-09 ENCOUNTER — Encounter: Payer: Self-pay | Admitting: Internal Medicine

## 2011-03-09 VITALS — BP 120/80 | Temp 98.4°F | Wt 164.0 lb

## 2011-03-09 DIAGNOSIS — R300 Dysuria: Secondary | ICD-10-CM

## 2011-03-09 DIAGNOSIS — R350 Frequency of micturition: Secondary | ICD-10-CM | POA: Diagnosis not present

## 2011-03-09 DIAGNOSIS — I10 Essential (primary) hypertension: Secondary | ICD-10-CM

## 2011-03-09 LAB — POCT URINALYSIS DIPSTICK
Bilirubin, UA: NEGATIVE
Glucose, UA: NEGATIVE
Ketones, UA: NEGATIVE
Nitrite, UA: NEGATIVE
Protein, UA: 100
Spec Grav, UA: 1.02
Urobilinogen, UA: 0.2
pH, UA: 6

## 2011-03-09 MED ORDER — CIPROFLOXACIN HCL 500 MG PO TABS: 500.0000 mg | ORAL_TABLET | Freq: Two times a day (BID) | ORAL | Status: AC

## 2011-03-09 NOTE — Patient Instructions (Signed)
Take your antibiotic as prescribed until ALL of it is gone, but stop if you develop a rash, swelling, or any side effects of the medication.  Contact our office as soon as possible if  there are side effects of the medication.  Return in 3 months for follow-up

## 2011-03-09 NOTE — Progress Notes (Signed)
  Subjective:    Patient ID: Alexander Mcdonald, male    DOB: 1926-08-25, 78 y.o.   MRN: [id number]  HPI  78 year old patient who presents with a one-day history of burning dysuria and frequency. No prior history of UTIs. No fever chills or flank pain. No hematuria    Review of Systems  Constitutional: Negative for fever and chills.  Genitourinary: Positive for dysuria, urgency and frequency. Negative for hematuria, flank pain and discharge.       Objective:   Physical Exam  Constitutional: He appears well-developed and well-nourished. No distress.       No distress. Normal blood pressure. Temperature 98.4  Cardiovascular: Normal rate, regular rhythm and normal heart sounds.   Pulmonary/Chest: Effort normal and breath sounds normal.  Abdominal: Soft. Bowel sounds are normal.          Assessment & Plan:   Urinary tract infection. We'll treat with 7 days of Cipro. Hypertension stable Followup 3 months as scheduled

## 2011-04-11 ENCOUNTER — Telehealth: Payer: Self-pay | Admitting: Internal Medicine

## 2011-04-11 DIAGNOSIS — H3530 Unspecified macular degeneration: Secondary | ICD-10-CM | POA: Diagnosis not present

## 2011-04-11 DIAGNOSIS — H348192 Central retinal vein occlusion, unspecified eye, stable: Secondary | ICD-10-CM | POA: Diagnosis not present

## 2011-04-11 DIAGNOSIS — H35059 Retinal neovascularization, unspecified, unspecified eye: Secondary | ICD-10-CM | POA: Diagnosis not present

## 2011-04-11 NOTE — Telephone Encounter (Signed)
Pt blood pressure is 180/64, wife is very concerned and is requesting to be seen tomorrow

## 2011-04-11 NOTE — Telephone Encounter (Signed)
Please make appt for tomorrow - ok to use SDA slot

## 2011-04-12 ENCOUNTER — Telehealth: Payer: Self-pay | Admitting: Internal Medicine

## 2011-04-12 ENCOUNTER — Ambulatory Visit (INDEPENDENT_AMBULATORY_CARE_PROVIDER_SITE_OTHER): Payer: Medicare Other | Admitting: Internal Medicine

## 2011-04-12 ENCOUNTER — Encounter: Payer: Self-pay | Admitting: Internal Medicine

## 2011-04-12 VITALS — BP 160/80 | Temp 97.9°F | Wt 167.0 lb

## 2011-04-12 DIAGNOSIS — E7800 Pure hypercholesterolemia, unspecified: Secondary | ICD-10-CM

## 2011-04-12 DIAGNOSIS — I10 Essential (primary) hypertension: Secondary | ICD-10-CM | POA: Diagnosis not present

## 2011-04-12 MED ORDER — AMLODIPINE BESYLATE 2.5 MG PO TABS: 2.5000 mg | ORAL_TABLET | Freq: Every day | ORAL | Status: DC

## 2011-04-12 MED ORDER — FOSINOPRIL SODIUM 20 MG PO TABS: 20.0000 mg | ORAL_TABLET | Freq: Every day | ORAL | Status: DC

## 2011-04-12 NOTE — Telephone Encounter (Signed)
Please advise - was on 10mg  1/2 tab but was dc's at visit

## 2011-04-12 NOTE — Progress Notes (Signed)
  Subjective:    Patient ID: Alexander Mcdonald, male    DOB: Aug 18, 1926, 78 y.o.   MRN: [id number]  HPI  78 year old patient has a history of treated hypertension. He was seen by ophthalmology yesterday and noted to have an elevated systolic reading. He does admit to being under some increasing situational stress. Blood pressure last month was 120/80 though is elevated in the 150/90 range in November  BP Readings from Last 3 Encounters:  04/12/11 160/80  03/09/11 120/80  12/05/10 150/90      Review of Systems  Constitutional: Negative.        Objective:   Physical Exam  Constitutional: He appears well-developed and well-nourished. No distress.       Blood pressure 190/80          Assessment & Plan:   Hypertension suboptimal control. We'll increase Monopril to 20 mg daily and add amlodipine 2.5 mg daily. We'll recheck in 2 weeks

## 2011-04-12 NOTE — Telephone Encounter (Signed)
Pt called and said that he had told Dr Burnice Logan that he had been taking 10 mg of Monopril, but he was only taking 5 mg. Pt was given a script today for 20mg , and pt doesn't know what he should do. Pt has not picked up script from pharmacy, so if pcp wants to change it to 10mg ?

## 2011-04-12 NOTE — Progress Notes (Signed)
  Subjective:    Patient ID: Alexander Mcdonald, male    DOB: 1926/11/10, 78 y.o.   MRN: [id number]  HPI  BP Readings from Last 3 Encounters:  04/12/11 160/80  03/09/11 120/80  12/05/10 150/90    Review of Systems     Objective:   Physical Exam        Assessment & Plan:

## 2011-04-12 NOTE — Patient Instructions (Signed)
Limit your sodium (Salt) intake    It is important that you exercise regularly, at least 20 minutes 3 to 4 times per week.  If you develop chest pain or shortness of breath seek  medical attention. 

## 2011-04-13 NOTE — Telephone Encounter (Signed)
Spoke with  Pt - informed need to take 10mg  qd

## 2011-04-13 NOTE — Telephone Encounter (Signed)
Pts wife called this morning to check on status of getting the Monopril dosage changed.

## 2011-04-13 NOTE — Telephone Encounter (Signed)
Patient called back and stated he wants the nurse to call him asap.

## 2011-04-13 NOTE — Telephone Encounter (Signed)
10 mg daily dose

## 2011-04-14 NOTE — Telephone Encounter (Signed)
Pt states the rx that was sent to [facility] on Wednsday was for 20mg  and not 10mg .  States he has 3 remaining refills for the 10 mg tabs and will get that filled and not the 20mg .

## 2011-04-17 ENCOUNTER — Telehealth: Payer: Self-pay

## 2011-04-17 MED ORDER — FOSINOPRIL SODIUM 10 MG PO TABS: 10.0000 mg | ORAL_TABLET | Freq: Every day | ORAL | Status: DC

## 2011-04-17 NOTE — Telephone Encounter (Signed)
Correct monopril rx sent to [facility] after speaking with pt and wife. KIK

## 2011-05-02 ENCOUNTER — Ambulatory Visit (INDEPENDENT_AMBULATORY_CARE_PROVIDER_SITE_OTHER): Payer: Medicare Other | Admitting: Internal Medicine

## 2011-05-02 ENCOUNTER — Encounter: Payer: Self-pay | Admitting: Internal Medicine

## 2011-05-02 VITALS — BP 100/80 | Temp 98.1°F | Wt 166.0 lb

## 2011-05-02 DIAGNOSIS — I10 Essential (primary) hypertension: Secondary | ICD-10-CM | POA: Diagnosis not present

## 2011-05-02 NOTE — Progress Notes (Signed)
  Subjective:    Patient ID: Alexander Mcdonald, male    DOB: 08-26-1926, 78 y.o.   MRN: [id number]  HPI  78 year old patient who is seen today for followup of hypertension. Amlodipine 2.5 mg was added to his regimen after consistently high systolic readings. He is tolerating this medication well. No peripheral edema.    Review of Systems  Constitutional: Negative.        Objective:   Physical Exam  Constitutional: He appears well-developed and well-nourished. No distress.       Blood pressure [id number] systolic And 76- 80 diastolic           Assessment & Plan:   Hypertension improved. We'll continue present regimen. Reassess in 3 months. Low-salt diet recommended. Home blood pressure monitor and recommended

## 2011-05-02 NOTE — Patient Instructions (Signed)
Limit your sodium (Salt) intake  Please check your blood pressure on a regular basis.  If it is consistently greater than 150/90, please make an office appointment.  Return in 3 months for follow-up  

## 2011-05-05 DIAGNOSIS — H348392 Tributary (branch) retinal vein occlusion, unspecified eye, stable: Secondary | ICD-10-CM | POA: Diagnosis not present

## 2011-05-05 DIAGNOSIS — H4011X0 Primary open-angle glaucoma, stage unspecified: Secondary | ICD-10-CM | POA: Diagnosis not present

## 2011-05-05 DIAGNOSIS — H409 Unspecified glaucoma: Secondary | ICD-10-CM | POA: Diagnosis not present

## 2011-05-05 DIAGNOSIS — H353190 Nonexudative age-related macular degeneration, unspecified eye, stage unspecified: Secondary | ICD-10-CM | POA: Diagnosis not present

## 2011-05-05 DIAGNOSIS — H35359 Cystoid macular degeneration, unspecified eye: Secondary | ICD-10-CM | POA: Diagnosis not present

## 2011-05-21 ENCOUNTER — Emergency Department (HOSPITAL_COMMUNITY): Payer: Medicare Other

## 2011-05-21 ENCOUNTER — Encounter (HOSPITAL_COMMUNITY): Payer: Self-pay

## 2011-05-21 ENCOUNTER — Emergency Department (HOSPITAL_COMMUNITY)
Admission: EM | Admit: 2011-05-21 | Discharge: 2011-05-21 | Disposition: A | Discharge status: Home / Self Care | Payer: Medicare Other | Attending: Emergency Medicine | Admitting: Emergency Medicine

## 2011-05-21 DIAGNOSIS — M19079 Primary osteoarthritis, unspecified ankle and foot: Secondary | ICD-10-CM | POA: Diagnosis not present

## 2011-05-21 DIAGNOSIS — M25569 Pain in unspecified knee: Secondary | ICD-10-CM | POA: Diagnosis not present

## 2011-05-21 DIAGNOSIS — E7800 Pure hypercholesterolemia, unspecified: Secondary | ICD-10-CM | POA: Insufficient documentation

## 2011-05-21 DIAGNOSIS — M25476 Effusion, unspecified foot: Secondary | ICD-10-CM | POA: Insufficient documentation

## 2011-05-21 DIAGNOSIS — M109 Gout, unspecified: Secondary | ICD-10-CM | POA: Insufficient documentation

## 2011-05-21 DIAGNOSIS — F3289 Other specified depressive episodes: Secondary | ICD-10-CM | POA: Insufficient documentation

## 2011-05-21 DIAGNOSIS — I2510 Atherosclerotic heart disease of native coronary artery without angina pectoris: Secondary | ICD-10-CM | POA: Diagnosis not present

## 2011-05-21 DIAGNOSIS — M7989 Other specified soft tissue disorders: Secondary | ICD-10-CM | POA: Diagnosis not present

## 2011-05-21 DIAGNOSIS — M25579 Pain in unspecified ankle and joints of unspecified foot: Secondary | ICD-10-CM | POA: Diagnosis not present

## 2011-05-21 DIAGNOSIS — M25473 Effusion, unspecified ankle: Secondary | ICD-10-CM | POA: Diagnosis not present

## 2011-05-21 DIAGNOSIS — F329 Major depressive disorder, single episode, unspecified: Secondary | ICD-10-CM | POA: Diagnosis not present

## 2011-05-21 DIAGNOSIS — Z8546 Personal history of malignant neoplasm of prostate: Secondary | ICD-10-CM | POA: Diagnosis not present

## 2011-05-21 DIAGNOSIS — M2550 Pain in unspecified joint: Secondary | ICD-10-CM

## 2011-05-21 DIAGNOSIS — I10 Essential (primary) hypertension: Secondary | ICD-10-CM | POA: Diagnosis not present

## 2011-05-21 LAB — CBC
HCT: 38.7 % — ABNORMAL LOW (ref 39.0–52.0)
Hemoglobin: 13.4 g/dL (ref 13.0–17.0)
MCH: 29.6 pg (ref 26.0–34.0)
MCHC: 34.6 g/dL (ref 30.0–36.0)
MCV: 85.6 fL (ref 78.0–100.0)
Platelets: 156 10*3/uL (ref 150–400)
RBC: 4.52 MIL/uL (ref 4.22–5.81)
RDW: 13.8 % (ref 11.5–15.5)
WBC: 6.9 10*3/uL (ref 4.0–10.5)

## 2011-05-21 LAB — BASIC METABOLIC PANEL
BUN: 24 mg/dL — ABNORMAL HIGH (ref 6–23)
CO2: 24 mEq/L (ref 19–32)
Calcium: 9.1 mg/dL (ref 8.4–10.5)
Chloride: 99 mEq/L (ref 96–112)
Creatinine, Ser: 1.62 mg/dL — ABNORMAL HIGH (ref 0.50–1.35)
GFR calc Af Amer: 44 mL/min — ABNORMAL LOW (ref >90)
GFR calc non Af Amer: 38 mL/min — ABNORMAL LOW (ref >90)
Glucose, Bld: 108 mg/dL — ABNORMAL HIGH (ref 70–99)
Potassium: 4.4 mEq/L (ref 3.5–5.1)
Sodium: 133 mEq/L — ABNORMAL LOW (ref 135–145)

## 2011-05-21 LAB — URIC ACID: Uric Acid, Serum: 6.4 mg/dL (ref 4.0–7.8)

## 2011-05-21 MED ORDER — HYDROCODONE-ACETAMINOPHEN 5-325 MG PO TABS: 1.0000 | ORAL_TABLET | Freq: Four times a day (QID) | ORAL | Status: AC | PRN

## 2011-05-21 MED ORDER — CEPHALEXIN 250 MG PO CAPS: 250.0000 mg | ORAL_CAPSULE | Freq: Four times a day (QID) | ORAL | Status: DC

## 2011-05-21 MED ORDER — HYDROCODONE-ACETAMINOPHEN 5-325 MG PO TABS
1.0000 | ORAL_TABLET | Freq: Once | ORAL | Status: AC
  Administered 2011-05-21: 1 via ORAL
  Filled 2011-05-21: qty 1

## 2011-05-21 MED ORDER — COLCHICINE 0.6 MG PO TABS: 1.2000 mg | ORAL_TABLET | Freq: Once | ORAL | Status: DC

## 2011-05-21 NOTE — ED Provider Notes (Signed)
History     CSN: [id number]  Arrival date & time 05/21/11  0905   First MD Initiated Contact with Patient 05/21/11 [phone]      Chief Complaint  Patient presents with  . Joint Swelling    (Consider location/radiation/quality/duration/timing/severity/associated sxs/prior treatment) HPI Comments: Pt has been eating at least 4-5 sausages a day.  Patient is a 78 y.o. male presenting with ankle pain. The history is provided by the patient.  Ankle Pain  The incident occurred more than 2 days ago. There was no injury mechanism. The pain is present in the right ankle. The pain is moderate. Pertinent negatives include no numbness, no loss of motion and no loss of sensation. The symptoms are aggravated by activity and bearing weight. He has tried nothing for the symptoms.  Pt had gout many years ago but it has been a while.  No fevers.    Past Medical History  Diagnosis Date  . ACUTE RENAL FAILURE W/LESION OF TUBULAR NECROSIS 01/04/2007  . CAROTID ARTERY DISEASE 06/01/2009  . DEPRESSION 07/25/2006  . DIVERTICULOSIS OF COLON 02/01/2007  . GOUT 07/25/2006  . HEMORRHOID, THROMBOSED 02/15/2009  . HYPERCHOLESTEROLEMIA 07/25/2006  . HYPERTENSION 07/25/2006  . PROSTATE CANCER, HX OF 07/25/2006  . Rosacea 09/28/2008  . WEIGHT LOSS 09/01/2009  . Glaucoma     Past Surgical History  Procedure Date  . Carotid endarterectomy   . Cataract extraction     History reviewed. No pertinent family history.  History  Substance Use Topics  . Smoking status: Former Smoker    Quit date: 01/17/1988  . Smokeless tobacco: Never Used  . Alcohol Use: Yes      Review of Systems  Neurological: Negative for numbness.  All other systems reviewed and are negative.    Allergies  Review of patient's allergies indicates no known allergies.  Home Medications   Current Outpatient Rx  Name Route Sig Dispense Refill  . ASPIRIN 325 MG PO TABS Oral Take 325 mg by mouth daily.      . OMEGA-3 FATTY ACIDS 1000 MG PO CAPS  Oral Take 1 g by mouth daily.    . FOSINOPRIL SODIUM 10 MG PO TABS Oral Take 1 tablet (10 mg total) by mouth daily. 90 tablet 3    This is the current dose - pt and wife aware - dc  ...  . MONTELUKAST SODIUM 10 MG PO TABS Oral Take 1 tablet (10 mg total) by mouth daily. 90 tablet 6  . NEXIUM 40 MG PO CPDR  TAKE 1 CAPSULE BY MOUTH EVERY DAY 90 capsule 3  . TAMSULOSIN HCL 0.4 MG PO CAPS Oral Take 1 capsule (0.4 mg total) by mouth daily. 90 capsule 3  . TIMOLOL HEMIHYDRATE 0.5 % OP SOLN  1 drop 2 (two) times daily.      Marland Kitchen VYTORIN 10-20 MG PO TABS  TAKE 1 TABLET BY MOUTH DAILY 90 tablet 3    BP 160/53  Pulse 70  Temp(Src) 97.8 F (36.6 C) (Oral)  Resp 18  SpO2 98%  Physical Exam  Nursing note and vitals reviewed. Constitutional: He appears well-developed and well-nourished. No distress.  HENT:  Head: Normocephalic and atraumatic.  Right Ear: External ear normal.  Left Ear: External ear normal.  Eyes: Conjunctivae are normal. Right eye exhibits no discharge. Left eye exhibits no discharge. No scleral icterus.  Neck: Neck supple. No tracheal deviation present.  Cardiovascular: Normal rate, regular rhythm and intact distal pulses.   Murmur heard. Pulmonary/Chest: Effort normal  and breath sounds normal. No stridor. No respiratory distress. He has no wheezes. He has no rales.  Musculoskeletal: He exhibits edema and tenderness.       Right ankle: He exhibits swelling. tenderness. Lateral malleolus tenderness found. No head of 5th metatarsal and no proximal fibula tenderness found. Achilles tendon normal.       Erythema lateral malleolus and surrounding tissue, ttp, pain with range of motion, no ttp calf or lymphangitic streaking  Neurological: He is alert. Cranial nerve deficit: no gross deficits.  Skin: Skin is warm and dry. No rash noted.  Psychiatric: He has a normal mood and affect.    ED Course  Procedures (including critical care time)  Labs Reviewed  BASIC METABOLIC PANEL -  Abnormal; Notable for the following:    Sodium 133 (*)    Glucose, Bld 108 (*)    BUN 24 (*)    Creatinine, Ser 1.62 (*)    GFR calc non Af Amer 38 (*)    GFR calc Af Amer 44 (*)    All other components within normal limits  CBC - Abnormal; Notable for the following:    HCT 38.7 (*)    All other components within normal limits  URIC ACID   Dg Ankle Complete Right  05/21/2011  *RADIOLOGY REPORT*  Clinical Data: Swelling and pain  RIGHT ANKLE - COMPLETE 3+ VIEW  Comparison: None.  Findings: Extensive vascular calcifications.  The ankle mortise is intact.  Calcaneal spurs at the plantar aponeurosis and Achilles tendon.  Negative for fracture, dislocation, or other acute bony abnormality.  Normal mineralization and alignment.  IMPRESSION:  1.  Negative for fracture or other acute bone injury. 2.  Degenerative changes as above.  Original Report Authenticated By: Trecia Rogers, M.D.     1. Joint pain of leg       MDM  The patient's symptoms are suggestive of a cellulitis versus an acute gout attack. I doubt a septic arthritis. X-ray does not suggest osteomyelitis. The patient has had gout before and am most suspicious of this being the etiology. Patient will be discharged home on a course of culture seen and hydrocodone as needed for pain. With his age and the possibility that this could be an early cellulitis I will start him on Keflex as well and have him followup with his primary Dr. to be rechecked this week.  I've explained these findings to the patient and his daughter and they're comfortable with this plan.        Kathalene Frames, MD 05/21/11 1120

## 2011-05-21 NOTE — ED Notes (Signed)
Pt in from home with c/o right ankle pain and swelling states a hx of gout denies recent injury states onset Friday with no relief today

## 2011-05-21 NOTE — ED Notes (Signed)
Pt to xray

## 2011-05-25 ENCOUNTER — Ambulatory Visit (INDEPENDENT_AMBULATORY_CARE_PROVIDER_SITE_OTHER): Payer: Medicare Other | Admitting: Internal Medicine

## 2011-05-25 ENCOUNTER — Encounter: Payer: Self-pay | Admitting: Internal Medicine

## 2011-05-25 VITALS — BP 148/80 | Temp 97.4°F | Wt 167.0 lb

## 2011-05-25 DIAGNOSIS — M109 Gout, unspecified: Secondary | ICD-10-CM | POA: Diagnosis not present

## 2011-05-25 DIAGNOSIS — I10 Essential (primary) hypertension: Secondary | ICD-10-CM | POA: Diagnosis not present

## 2011-05-25 MED ORDER — AMLODIPINE BESYLATE 2.5 MG PO TABS: 2.5000 mg | ORAL_TABLET | Freq: Every day | ORAL | Status: DC

## 2011-05-25 MED ORDER — FOSINOPRIL SODIUM 20 MG PO TABS: 20.0000 mg | ORAL_TABLET | Freq: Every day | ORAL | Status: DC

## 2011-05-25 NOTE — Progress Notes (Signed)
  Subjective:    Patient ID: Alexander Mcdonald, male    DOB: 02-20-1926, 78 y.o.   MRN: [id number]  HPI  78 year old patient who has a history of treated hypertension. He was seen in the ED 4 days ago for gout involving the right ankle. While visiting his wife yesterday in rehabilitation his blood pressures noted the quite high especially systolic readings. Blood pressure was elevated in the ED and the patient states that his systolic readings have chronically been elevated at home. Blood pressure regimen at the present time includes Monopril 10 mg daily    Review of Systems  Constitutional: Negative for fever, chills, appetite change and fatigue.  HENT: Negative for hearing loss, ear pain, congestion, sore throat, trouble swallowing, neck stiffness, dental problem, voice change and tinnitus.   Eyes: Negative for pain, discharge and visual disturbance.  Respiratory: Negative for cough, chest tightness, wheezing and stridor.   Cardiovascular: Negative for chest pain, palpitations and leg swelling.  Gastrointestinal: Negative for nausea, vomiting, abdominal pain, diarrhea, constipation, blood in stool and abdominal distention.  Genitourinary: Negative for urgency, hematuria, flank pain, discharge, difficulty urinating and genital sores.  Musculoskeletal: Positive for joint swelling. Negative for myalgias, back pain, arthralgias and gait problem.  Skin: Negative for rash.  Neurological: Negative for dizziness, syncope, speech difficulty, weakness, numbness and headaches.  Hematological: Negative for adenopathy. Does not bruise/bleed easily.  Psychiatric/Behavioral: Negative for behavioral problems and dysphoric mood. The patient is not nervous/anxious.        Objective:   Physical Exam  Constitutional:       Blood pressure 160/80 right arm and 180/70 left arm  Musculoskeletal:       Mild soft tissue swelling right lateral ankle but no erythema or pain          Assessment & Plan:    Hypertension suboptimal control disposition we'll increase Monopril to 20 mg daily and add amlodipine 2.5 mg daily we'll recheck in 2 weeks Status post gout improved

## 2011-05-25 NOTE — Patient Instructions (Signed)
Limit your sodium (Salt) intake  Please check your blood pressure on a regular basis.  If it is consistently greater than 150/90, please make an office appointment.  Return in two weeks for follow-up

## 2011-05-29 DIAGNOSIS — H4011X0 Primary open-angle glaucoma, stage unspecified: Secondary | ICD-10-CM | POA: Diagnosis not present

## 2011-05-29 DIAGNOSIS — H409 Unspecified glaucoma: Secondary | ICD-10-CM | POA: Diagnosis not present

## 2011-05-29 DIAGNOSIS — H348392 Tributary (branch) retinal vein occlusion, unspecified eye, stable: Secondary | ICD-10-CM | POA: Diagnosis not present

## 2011-06-05 ENCOUNTER — Ambulatory Visit: Payer: Medicare Other | Admitting: Internal Medicine

## 2011-06-08 ENCOUNTER — Encounter: Payer: Self-pay | Admitting: Internal Medicine

## 2011-06-08 ENCOUNTER — Ambulatory Visit (INDEPENDENT_AMBULATORY_CARE_PROVIDER_SITE_OTHER): Payer: Medicare Other | Admitting: Internal Medicine

## 2011-06-08 VITALS — BP 130/80 | Temp 97.9°F | Wt 163.0 lb

## 2011-06-08 DIAGNOSIS — M109 Gout, unspecified: Secondary | ICD-10-CM | POA: Diagnosis not present

## 2011-06-08 DIAGNOSIS — I10 Essential (primary) hypertension: Secondary | ICD-10-CM | POA: Diagnosis not present

## 2011-06-08 NOTE — Patient Instructions (Signed)
Limit your sodium (Salt) intake  Please check your blood pressure on a regular basis.  If it is consistently greater than 150/90, please make an office appointment.  Return in 3 months for follow-up  

## 2011-06-08 NOTE — Progress Notes (Signed)
  Subjective:    Patient ID: Alexander Mcdonald, male    DOB: 1926-03-07, 78 y.o.   MRN: [id number]  HPI   78 year old patient who is seen today for two-week followup of his hypertension. His medications were adjusted 2 weeks ago after noting to have high blood pressure readings in the ED. He presented at that time for acute gout which has not reoccurred. Monopril was increased to 20 mg daily and amlodipine added to his regimen. He has tolerated this medication well without difficulty. He has had no recurrent gout    Review of Systems  Constitutional: Negative for fever, chills, appetite change and fatigue.  HENT: Negative for hearing loss, ear pain, congestion, sore throat, trouble swallowing, neck stiffness, dental problem, voice change and tinnitus.   Eyes: Negative for pain, discharge and visual disturbance.  Respiratory: Negative for cough, chest tightness, wheezing and stridor.   Cardiovascular: Negative for chest pain, palpitations and leg swelling.  Gastrointestinal: Negative for nausea, vomiting, abdominal pain, diarrhea, constipation, blood in stool and abdominal distention.  Genitourinary: Negative for urgency, hematuria, flank pain, discharge, difficulty urinating and genital sores.  Musculoskeletal: Negative for myalgias, back pain, joint swelling, arthralgias and gait problem.  Skin: Negative for rash.  Neurological: Negative for dizziness, syncope, speech difficulty, weakness, numbness and headaches.  Hematological: Negative for adenopathy. Does not bruise/bleed easily.  Psychiatric/Behavioral: Negative for behavioral problems and dysphoric mood. The patient is not nervous/anxious.        Objective:   Physical Exam  Constitutional: He appears well-developed and well-nourished. No distress.       Blood pressure 120-130/70-80 in both arms          Assessment & Plan:   Hypertension well controlled. We'll continue present regimen History of gout stable  Low-salt diet  recommended Recheck 3 months

## 2011-06-13 DIAGNOSIS — H348392 Tributary (branch) retinal vein occlusion, unspecified eye, stable: Secondary | ICD-10-CM | POA: Diagnosis not present

## 2011-06-13 DIAGNOSIS — H35359 Cystoid macular degeneration, unspecified eye: Secondary | ICD-10-CM | POA: Diagnosis not present

## 2011-06-13 DIAGNOSIS — H348192 Central retinal vein occlusion, unspecified eye, stable: Secondary | ICD-10-CM | POA: Diagnosis not present

## 2011-06-15 ENCOUNTER — Other Ambulatory Visit: Payer: Self-pay | Admitting: Cardiology

## 2011-06-15 DIAGNOSIS — I6529 Occlusion and stenosis of unspecified carotid artery: Secondary | ICD-10-CM

## 2011-06-19 ENCOUNTER — Encounter (INDEPENDENT_AMBULATORY_CARE_PROVIDER_SITE_OTHER): Payer: Medicare Other

## 2011-06-19 DIAGNOSIS — I6529 Occlusion and stenosis of unspecified carotid artery: Secondary | ICD-10-CM

## 2011-06-26 NOTE — Progress Notes (Signed)
Quick Note:  Spoke with pt- informed test normal ______ 

## 2011-07-07 DIAGNOSIS — H348392 Tributary (branch) retinal vein occlusion, unspecified eye, stable: Secondary | ICD-10-CM | POA: Diagnosis not present

## 2011-07-11 ENCOUNTER — Other Ambulatory Visit: Payer: Self-pay | Admitting: Internal Medicine

## 2011-07-11 ENCOUNTER — Telehealth: Payer: Self-pay | Admitting: Internal Medicine

## 2011-07-11 MED ORDER — LEVOCETIRIZINE DIHYDROCHLORIDE 5 MG PO TABS: 5.0000 mg | ORAL_TABLET | Freq: Every evening | ORAL | Status: DC

## 2011-07-11 NOTE — Telephone Encounter (Signed)
Attempt to call - VM - LMTCB if questions - will send rx to [facility]

## 2011-07-11 NOTE — Telephone Encounter (Signed)
Please advise 

## 2011-07-11 NOTE — Telephone Encounter (Signed)
Caller: [name]/Child; PCP: Bluford Kaufmann; CB#: [phone]; Call regarding Allergies/Med ?; Constant runny nose and sneezing. Pt has tried Claritin and Singulair w/o relief of his sx. Pt would like to try levocetirizine/Xyzal. Pls call.

## 2011-07-11 NOTE — Telephone Encounter (Signed)
Ok  #30 

## 2011-08-01 ENCOUNTER — Ambulatory Visit: Payer: Medicare Other | Admitting: Internal Medicine

## 2011-08-04 ENCOUNTER — Encounter: Payer: Self-pay | Admitting: Internal Medicine

## 2011-08-04 ENCOUNTER — Ambulatory Visit (INDEPENDENT_AMBULATORY_CARE_PROVIDER_SITE_OTHER): Payer: Medicare Other | Admitting: Internal Medicine

## 2011-08-04 VITALS — BP 126/70 | Temp 98.1°F | Wt 167.0 lb

## 2011-08-04 DIAGNOSIS — I6529 Occlusion and stenosis of unspecified carotid artery: Secondary | ICD-10-CM | POA: Diagnosis not present

## 2011-08-04 DIAGNOSIS — I10 Essential (primary) hypertension: Secondary | ICD-10-CM

## 2011-08-04 DIAGNOSIS — F329 Major depressive disorder, single episode, unspecified: Secondary | ICD-10-CM | POA: Diagnosis not present

## 2011-08-04 NOTE — Patient Instructions (Addendum)
Limit your sodium (Salt) intake    It is important that you exercise regularly, at least 20 minutes 3 to 4 times per week.  If you develop chest pain or shortness of breath seek  medical attention.  Please check your blood pressure on a regular basis.  If it is consistently greater than 150/90, please make an office appointment.  Return in one month for follow-up

## 2011-08-04 NOTE — Progress Notes (Signed)
  Subjective:    Patient ID: Alexander Mcdonald, male    DOB: 08-10-1926, 78 y.o.   MRN: [id number]  HPI  Wt Readings from Last 3 Encounters:  08/04/11 167 lb (75.751 kg)  06/08/11 163 lb (73.936 kg)  05/25/11 167 lb (75.33 kg)     78 year old patient who is seen today for his six-month followup. Medical problems include a history of hypertension. He is accompanied by his daughter. His chief complaint is fatigue and dizziness. These problems have been chronic. The change in weight nausea or change in his appetite. 2 months ago laboratory studies were obtained and were reviewed. These are fairly unremarkable and unchanged. He does have a history of mild chronic kidney disease. Medical regimen includes Monopril and amlodipine. Dizziness does not sound orthostatic. Blood pressure today is at low normal range  Review of Systems  Constitutional: Positive for fatigue. Negative for fever, chills and appetite change.  HENT: Negative for hearing loss, ear pain, congestion, sore throat, trouble swallowing, neck stiffness, dental problem, voice change and tinnitus.   Eyes: Negative for pain, discharge and visual disturbance.  Respiratory: Negative for cough, chest tightness, wheezing and stridor.   Cardiovascular: Negative for chest pain, palpitations and leg swelling.  Gastrointestinal: Negative for nausea, vomiting, abdominal pain, diarrhea, constipation, blood in stool and abdominal distention.  Genitourinary: Negative for urgency, hematuria, flank pain, discharge, difficulty urinating and genital sores.  Musculoskeletal: Negative for myalgias, back pain, joint swelling, arthralgias and gait problem.  Skin: Negative for rash.  Neurological: Positive for weakness and light-headedness. Negative for dizziness, syncope, speech difficulty, numbness and headaches.  Hematological: Negative for adenopathy. Does not bruise/bleed easily.  Psychiatric/Behavioral: Negative for behavioral problems and dysphoric  mood. The patient is not nervous/anxious.        Objective:   Physical Exam  Constitutional: He is oriented to person, place, and time. He appears well-developed.       Blood pressure 110/70  HENT:  Head: Normocephalic.  Right Ear: External ear normal.  Left Ear: External ear normal.  Eyes: Conjunctivae and EOM are normal.  Neck: Normal range of motion.  Cardiovascular: Normal rate and normal heart sounds.   Pulmonary/Chest: Breath sounds normal.  Abdominal: Bowel sounds are normal.  Musculoskeletal: Normal range of motion. He exhibits no edema and no tenderness.  Neurological: He is alert and oriented to person, place, and time.  Psychiatric: He has a normal mood and affect. His behavior is normal.          Assessment & Plan:   Hypertension. Blood pressure presently is in a low-normal range. He complains of some fatigue and dizziness. He is on low dose and lidocaine. We'll discontinue and recheck in one month and reassess symptoms. History depression stable Carotid artery disease asymptomatic

## 2011-08-14 ENCOUNTER — Telehealth: Payer: Self-pay | Admitting: Internal Medicine

## 2011-08-14 ENCOUNTER — Other Ambulatory Visit: Payer: Self-pay

## 2011-08-14 MED ORDER — MECLIZINE HCL 32 MG PO TABS: 32.0000 mg | ORAL_TABLET | Freq: Four times a day (QID) | ORAL | Status: DC | PRN

## 2011-08-14 NOTE — Telephone Encounter (Signed)
SEE HX  Caller: [name]/Child; PCP: Bluford Kaufmann; CB#: [phone];  Call regarding Dizziness;  Pt was inst to call back when she could conference her father in (he is [facility]).   She isnot able to do this and asked him some questions anad now is calling back.  Pt had appt 08/04/11 andstopped Norvasc 08/05/11.  Has been dizzy x 1 month. Even after stopping this med her remains dizzy.  What do they need to do next.  Please call her to advise.

## 2011-08-14 NOTE — Telephone Encounter (Signed)
Forward to dr. Burnice Logan to [name]

## 2011-08-14 NOTE — Telephone Encounter (Signed)
Caller: [name]/Child; PCP: Bluford Kaufmann; CB#: [phone]; Call regarding transient Dizziness.  Norvasc was DC'd 08/04/11 and pt still having dizziness. Attemped to triage Dizziness or Vertigo.  She is not  with the pt. Pt is at an appt and she will call us back and conference pt in.

## 2011-08-14 NOTE — Telephone Encounter (Signed)
Please advise 

## 2011-08-16 ENCOUNTER — Telehealth: Payer: Self-pay | Admitting: Family Medicine

## 2011-08-16 MED ORDER — MECLIZINE HCL 25 MG PO TABS: 25.0000 mg | ORAL_TABLET | Freq: Four times a day (QID) | ORAL | Status: AC | PRN

## 2011-08-16 NOTE — Telephone Encounter (Signed)
The Antivert 32mg  is not available. The med comes 12.5mg  & 25mg . Please send in new script for what dosage you would like. Thanks.

## 2011-08-16 NOTE — Telephone Encounter (Signed)
New rx sent to [facility] for 25mg 

## 2011-08-17 ENCOUNTER — Telehealth: Payer: Self-pay | Admitting: Internal Medicine

## 2011-08-17 NOTE — Telephone Encounter (Signed)
she is also calling about her dad.  he has been having dizzy spells and they took him off one of his BP meds and sxs didn't improve.  Then they called something in for vertigo and that has been about a wk with no relief of sxs   He is not with her but  she said he is not having any chest pain or shortness of breath and no weakness   No bleeding   Dizziness occurs regardless of positional changes  .  Advised he will need appt    Appt made for 08-18-11 at 0945 with Dr Elease Hashimoto

## 2011-08-18 ENCOUNTER — Encounter: Payer: Self-pay | Admitting: Internal Medicine

## 2011-08-18 ENCOUNTER — Ambulatory Visit (INDEPENDENT_AMBULATORY_CARE_PROVIDER_SITE_OTHER): Payer: Medicare Other | Admitting: Internal Medicine

## 2011-08-18 VITALS — BP 130/70 | Temp 98.0°F | Wt 167.0 lb

## 2011-08-18 DIAGNOSIS — I6529 Occlusion and stenosis of unspecified carotid artery: Secondary | ICD-10-CM | POA: Diagnosis not present

## 2011-08-18 DIAGNOSIS — I10 Essential (primary) hypertension: Secondary | ICD-10-CM | POA: Diagnosis not present

## 2011-08-18 NOTE — Progress Notes (Signed)
Subjective:    Patient ID: Alexander Mcdonald, male    DOB: 1927-01-10, 78 y.o.   MRN: [id number]  HPI 78 year old patient who is seen today for followup. He has a history of treated hypertension as well as carotid artery disease he has a long history of very brief positional vertigo is accompanied by a daughter today. This has changed little. He states it happens once or twice a day with change in position and is very brief and self-limiting. Has had no history of falls.  Past Medical History  Diagnosis Date  . ACUTE RENAL FAILURE W/LESION OF TUBULAR NECROSIS 01/04/2007  . CAROTID ARTERY DISEASE 06/01/2009  . DEPRESSION 07/25/2006  . DIVERTICULOSIS OF COLON 02/01/2007  . GOUT 07/25/2006  . HEMORRHOID, THROMBOSED 02/15/2009  . HYPERCHOLESTEROLEMIA 07/25/2006  . HYPERTENSION 07/25/2006  . PROSTATE CANCER, HX OF 07/25/2006  . Rosacea 09/28/2008  . WEIGHT LOSS 09/01/2009  . Glaucoma     History   Social History  . Marital Status: Married    Spouse Name: N/A    Number of Children: N/A  . Years of Education: N/A   Occupational History  . Not on file.   Social History Main Topics  . Smoking status: Former Smoker    Quit date: 01/17/1988  . Smokeless tobacco: Never Used  . Alcohol Use: Yes  . Drug Use: No  . Sexually Active: Not on file   Other Topics Concern  . Not on file   Social History Narrative  . No narrative on file    Past Surgical History  Procedure Date  . Carotid endarterectomy   . Cataract extraction     No family history on file.  No Known Allergies  Current Outpatient Prescriptions on File Prior to Visit  Medication Sig Dispense Refill  . aspirin 325 MG tablet Take 325 mg by mouth daily.        . colchicine 0.6 MG tablet Take 2 tablets (1.2 mg total) by mouth once. May repeat one additional tablet in 1 hour  (3 tabs per day max)  6 tablet  1  . fish oil-omega-3 fatty acids 1000 MG capsule Take 1 g by mouth daily.      . fosinopril (MONOPRIL) 20 MG tablet Take 1  tablet (20 mg total) by mouth daily.  90 tablet  6  . levocetirizine (XYZAL) 5 MG tablet TAKE 1 TABLET BY MOUTH EVERY EVENING  30 tablet  0  . loratadine (CLARITIN) 10 MG tablet Take 10 mg by mouth daily.      . meclizine (ANTIVERT) 25 MG tablet Take 1 tablet (25 mg total) by mouth every 6 (six) hours as needed.  30 tablet  0  . montelukast (SINGULAIR) 10 MG tablet Take 1 tablet (10 mg total) by mouth daily.  90 tablet  6  . NEXIUM 40 MG capsule TAKE 1 CAPSULE BY MOUTH EVERY DAY  90 capsule  3  . Tamsulosin HCl (FLOMAX) 0.4 MG CAPS Take 1 capsule (0.4 mg total) by mouth daily.  90 capsule  3  . timolol (BETIMOL) 0.5 % ophthalmic solution 1 drop 2 (two) times daily.       Marland Kitchen VYTORIN 10-20 MG per tablet TAKE 1 TABLET BY MOUTH DAILY  90 tablet  3    BP 130/70  Temp 98 F (36.7 C) (Oral)  Wt 167 lb (75.751 kg)  SpO2 97%     Review of Systems  Constitutional: Negative for fever, chills, appetite change and fatigue.  HENT:  Negative for hearing loss, ear pain, congestion, sore throat, trouble swallowing, neck stiffness, dental problem, voice change and tinnitus.   Eyes: Negative for pain, discharge and visual disturbance.  Respiratory: Negative for cough, chest tightness, wheezing and stridor.   Cardiovascular: Negative for chest pain, palpitations and leg swelling.  Gastrointestinal: Negative for nausea, vomiting, abdominal pain, diarrhea, constipation, blood in stool and abdominal distention.  Genitourinary: Negative for urgency, hematuria, flank pain, discharge, difficulty urinating and genital sores.  Musculoskeletal: Negative for myalgias, back pain, joint swelling, arthralgias and gait problem.  Skin: Negative for rash.  Neurological: Positive for light-headedness. Negative for dizziness, syncope, speech difficulty, weakness, numbness and headaches.  Hematological: Negative for adenopathy. Does not bruise/bleed easily.  Psychiatric/Behavioral: Negative for behavioral problems and  dysphoric mood. The patient is not nervous/anxious.        Objective:   Physical Exam  Constitutional: He is oriented to person, place, and time. He appears well-developed and well-nourished. No distress.       Blood pressure 130/80 without orthostatic change  Neck: Normal range of motion. No JVD present. No tracheal deviation present. No thyromegaly present.  Cardiovascular: Normal rate and regular rhythm.   Pulmonary/Chest: Effort normal and breath sounds normal.  Neurological: He is oriented to person, place, and time. A cranial nerve deficit is present.       Gait slightly unsteady but not ataxic Finger to nose testing probably unremarkable for age          Assessment & Plan:   Positional vertigo HTN  Recheck 3 months

## 2011-08-18 NOTE — Patient Instructions (Signed)
Limit your sodium (Salt) intake  Please check your blood pressure on a regular basis.  If it is consistently greater than 150/90, please make an office appointment.  Return in 3 months for follow-up  

## 2011-09-01 ENCOUNTER — Ambulatory Visit: Payer: Medicare Other | Admitting: Internal Medicine

## 2011-09-01 DIAGNOSIS — H353290 Exudative age-related macular degeneration, unspecified eye, stage unspecified: Secondary | ICD-10-CM | POA: Diagnosis not present

## 2011-09-01 DIAGNOSIS — H35059 Retinal neovascularization, unspecified, unspecified eye: Secondary | ICD-10-CM | POA: Diagnosis not present

## 2011-09-01 DIAGNOSIS — H348192 Central retinal vein occlusion, unspecified eye, stable: Secondary | ICD-10-CM | POA: Diagnosis not present

## 2011-09-01 DIAGNOSIS — H35359 Cystoid macular degeneration, unspecified eye: Secondary | ICD-10-CM | POA: Diagnosis not present

## 2011-09-01 DIAGNOSIS — H348392 Tributary (branch) retinal vein occlusion, unspecified eye, stable: Secondary | ICD-10-CM | POA: Diagnosis not present

## 2011-09-04 ENCOUNTER — Ambulatory Visit: Payer: Medicare Other | Admitting: Internal Medicine

## 2011-09-04 ENCOUNTER — Ambulatory Visit (INDEPENDENT_AMBULATORY_CARE_PROVIDER_SITE_OTHER): Payer: Medicare Other | Admitting: Internal Medicine

## 2011-09-04 ENCOUNTER — Encounter: Payer: Self-pay | Admitting: Internal Medicine

## 2011-09-04 VITALS — BP 140/80 | Temp 98.1°F | Wt 164.0 lb

## 2011-09-04 DIAGNOSIS — I10 Essential (primary) hypertension: Secondary | ICD-10-CM | POA: Diagnosis not present

## 2011-09-04 DIAGNOSIS — M109 Gout, unspecified: Secondary | ICD-10-CM | POA: Diagnosis not present

## 2011-09-04 NOTE — Patient Instructions (Signed)
Limit your sodium (Salt) intake    It is important that you exercise regularly, at least 20 minutes 3 to 4 times per week.  If you develop chest pain or shortness of breath seek  medical attention.  Please check your blood pressure on a regular basis.  If it is consistently greater than 150/90, please make an office appointment.  

## 2011-09-04 NOTE — Progress Notes (Signed)
  Subjective:    Patient ID: Alexander Mcdonald, male    DOB: 08/30/1926, 78 y.o.   MRN: [id number]  HPI  78 year old patient who is seen today for followup of hypertension. He was seen by ophthalmology 3 days ago with elevated blood pressure readings. It is unclear how elevated blood pressure readings were but the patient feels that was in the 0000000 systolic range. He has a history of chronic kidney disease and gout. He has developed symptomatic hypotension with low dose amlodipine. No recent home blood pressure monitoring. Today he feels well    Review of Systems  Constitutional: Negative.        Objective:   Physical Exam  Constitutional: He appears well-developed and well-nourished. No distress.       Blood pressure readings A999333 systolic over Q000111Q diastolic          Assessment & Plan:   Labile hypertension Chronic kidney disease History of gout  Patient has developed symptomatic hypotension with low dose amlodipine. Options discussed. Will monitor home blood pressure readings carefully over the next 2 weeks. Will recheck at that time

## 2011-09-06 ENCOUNTER — Encounter: Payer: Self-pay | Admitting: Internal Medicine

## 2011-09-06 ENCOUNTER — Telehealth: Payer: Self-pay | Admitting: Internal Medicine

## 2011-09-06 ENCOUNTER — Ambulatory Visit (INDEPENDENT_AMBULATORY_CARE_PROVIDER_SITE_OTHER): Payer: Medicare Other | Admitting: Internal Medicine

## 2011-09-06 ENCOUNTER — Encounter: Payer: Medicare Other | Admitting: Internal Medicine

## 2011-09-06 VITALS — BP 200/70 | Wt 163.0 lb

## 2011-09-06 DIAGNOSIS — I10 Essential (primary) hypertension: Secondary | ICD-10-CM

## 2011-09-06 LAB — BASIC METABOLIC PANEL WITH GFR
BUN: 22 mg/dL (ref 6–23)
CO2: 27 meq/L (ref 19–32)
Calcium: 9.2 mg/dL (ref 8.4–10.5)
Chloride: 103 meq/L (ref 96–112)
Creatinine, Ser: 1.6 mg/dL — ABNORMAL HIGH (ref 0.4–1.5)
GFR: 43.76 mL/min — ABNORMAL LOW
Glucose, Bld: 94 mg/dL (ref 70–99)
Potassium: 4.2 meq/L (ref 3.5–5.1)
Sodium: 136 meq/L (ref 135–145)

## 2011-09-06 MED ORDER — ASPIRIN EC 81 MG PO TBEC: 81.0000 mg | DELAYED_RELEASE_TABLET | Freq: Every day | ORAL | Status: DC

## 2011-09-06 MED ORDER — AMLODIPINE BESYLATE 5 MG PO TABS: 5.0000 mg | ORAL_TABLET | Freq: Every day | ORAL | Status: DC

## 2011-09-06 NOTE — Telephone Encounter (Signed)
[facility] Triage Call Report Triage Record Num: [id number] Operator: Gordy Clement Patient Name: Alexander Mcdonald Call Date & Time: 09/05/2011 8:23:59PM Patient Phone: [phone] PCP: Marletta Lor Patient Gender: Male PCP Fax : [phone] Patient DOB: 12-Oct-1926 Practice Name: [facility] Reason for Call: Caller: Bluford Main; PCP: Bluford Kaufmann; CB#: [phone]; Call regarding BP 206/76; Was elevate last night and tonight. States recently stopped his Amolodipine. Asymptomatic. No complaints at all. Patient upset with wife for even calling, states he feels fine. He took one of his Amolodipine just before call. Per Hypertension, Diagnosed or Suspected Protocol all emergent symptoms ruled out with exception of "Systolic blood pressure of more than 99991111 mmHg OR diastolic blood pressure of more than 120 mmHg" Care advice given. Advised to recheck in 1-2 hrs and if it remains elevated advised to see Provider tonight. If under 180 advised to follow up with office in am for check. Protocol(s) Used: Hypertension, Diagnosed or Suspected Recommended Outcome per Protocol: See Provider within 4 hours Reason for Outcome: Systolic blood pressure of more than 99991111 mmHg OR diastolic blood pressure of more than 120 mmHg Sudden drop in blood pressure (54mm/Hg or more than usual reading) AND recent change in prescription, nonprescription, or alternative medications or their dosages. Care Advice: Call EMS 911 if new symptoms develop, such as severe shortness of breath, chest pain, change in mental status, acute neurologic deficit, seizure, visual disturbances, pulse rate > 120 / minute, or very irregular pulse. ~ ~ Call provider if symptoms worsen or new symptoms develop. ~ [place] / MAINTENANCE

## 2011-09-06 NOTE — Patient Instructions (Signed)
Limit your sodium (Salt) intake  Return next week for followup of your high blood pressure  Continued amlodipine 5 mg daily

## 2011-09-06 NOTE — Progress Notes (Signed)
  Subjective:    Patient ID: Alexander Mcdonald, male    DOB: 1926/06/09, 78 y.o.   MRN: [id number]  HPI  78 year old patient who is seen today for followup of hypertension.  Yesterday blood pressure became quite elevated with systolic readings in the A999333 range.   He has resumed amlodipine apparently 5 mg daily with his first dose approximately 9 PM yesterday he remains on Monopril 20 mg daily.  He has been on amlodipine in the past which was complicated by symptomatic hypotension.    Review of Systems  Constitutional: Negative.        Objective:   Physical Exam  Constitutional: He appears well-developed and well-nourished. No distress.       Blood pressure consistently 200/70 range in both arms          Assessment & Plan:   Systolic hypertension poor control. We'll resume amlodipine 5 mg daily. We'll recheck in one week low-salt diet encouraged Followup ophthalmology

## 2011-09-07 ENCOUNTER — Telehealth: Payer: Self-pay | Admitting: Internal Medicine

## 2011-09-07 ENCOUNTER — Other Ambulatory Visit: Payer: Self-pay | Admitting: Cardiology

## 2011-09-07 DIAGNOSIS — I701 Atherosclerosis of renal artery: Secondary | ICD-10-CM

## 2011-09-07 NOTE — Telephone Encounter (Signed)
Pts spouse called re: best times to take pts bp in morning? Pls call.

## 2011-09-07 NOTE — Telephone Encounter (Signed)
Called and spoke with pt's wife and she is aware to take pt's bp in the morning when he wakes up, after his pill and at night after supper.

## 2011-09-11 ENCOUNTER — Encounter (INDEPENDENT_AMBULATORY_CARE_PROVIDER_SITE_OTHER): Payer: Medicare Other

## 2011-09-11 DIAGNOSIS — I700 Atherosclerosis of aorta: Secondary | ICD-10-CM

## 2011-09-11 DIAGNOSIS — I10 Essential (primary) hypertension: Secondary | ICD-10-CM | POA: Diagnosis not present

## 2011-09-11 DIAGNOSIS — I701 Atherosclerosis of renal artery: Secondary | ICD-10-CM

## 2011-09-12 ENCOUNTER — Ambulatory Visit (INDEPENDENT_AMBULATORY_CARE_PROVIDER_SITE_OTHER): Payer: Medicare Other | Admitting: Internal Medicine

## 2011-09-12 ENCOUNTER — Encounter: Payer: Self-pay | Admitting: Internal Medicine

## 2011-09-12 VITALS — BP 140/80 | Wt 167.0 lb

## 2011-09-12 DIAGNOSIS — N189 Chronic kidney disease, unspecified: Secondary | ICD-10-CM | POA: Diagnosis not present

## 2011-09-12 DIAGNOSIS — I6529 Occlusion and stenosis of unspecified carotid artery: Secondary | ICD-10-CM | POA: Diagnosis not present

## 2011-09-12 DIAGNOSIS — I10 Essential (primary) hypertension: Secondary | ICD-10-CM

## 2011-09-12 NOTE — Progress Notes (Signed)
Subjective:    Patient ID: Alexander Mcdonald, male    DOB: July 07, 1926, 78 y.o.   MRN: [id number]  HPI   78 year old patient who is seen today for followup of hypertension.  Blood pressure has been quite labile but do to high systolic readings amlodipine has been reintroduced to his regimen. He had been on this in the past but was discontinued due to 2 symptomatic hypotension. He remains on Monopril he feels quite well. Blood pressure has improved. He does have some occasional elevated systolic readings. He does have a history of chronic kidney disease and history of ATN associated with an acute diarrheal illness in December of 2008.  The patient's ophthalmologist has suggested a renal consult mainly do to his labile hypertension.  The family is also requesting a renal consult. The patient has had a recent ultrasound to exclude renal artery stenosis. His renal indices have been stable. No symptomatic hypotension.  Renal ultrasound revealed a normal abdominal aorta with mild atherosclerotic changes without stenosis kidneys are normal size is slightly smaller than a previous study there is a stable left mid renal cyst;  normal renal arteries bilaterally  Past Medical History  Diagnosis Date  . ACUTE RENAL FAILURE W/LESION OF TUBULAR NECROSIS 01/04/2007  . CAROTID ARTERY DISEASE 06/01/2009  . DEPRESSION 07/25/2006  . DIVERTICULOSIS OF COLON 02/01/2007  . GOUT 07/25/2006  . HEMORRHOID, THROMBOSED 02/15/2009  . HYPERCHOLESTEROLEMIA 07/25/2006  . HYPERTENSION 07/25/2006  . PROSTATE CANCER, HX OF 07/25/2006  . Rosacea 09/28/2008  . WEIGHT LOSS 09/01/2009  . Glaucoma     History   Social History  . Marital Status: Married    Spouse Name: N/A    Number of Children: N/A  . Years of Education: N/A   Occupational History  . Not on file.   Social History Main Topics  . Smoking status: Former Smoker    Quit date: 01/17/1988  . Smokeless tobacco: Never Used  . Alcohol Use: Yes  . Drug Use: No  . Sexually  Active: Not on file   Other Topics Concern  . Not on file   Social History Narrative  . No narrative on file    Past Surgical History  Procedure Date  . Carotid endarterectomy   . Cataract extraction     No family history on file.  No Known Allergies  Current Outpatient Prescriptions on File Prior to Visit  Medication Sig Dispense Refill  . amLODipine (NORVASC) 5 MG tablet Take 1 tablet (5 mg total) by mouth daily.  90 tablet  3  . aspirin EC 81 MG tablet Take 1 tablet (81 mg total) by mouth daily.      . colchicine 0.6 MG tablet Take 2 tablets (1.2 mg total) by mouth once. May repeat one additional tablet in 1 hour  (3 tabs per day max)  6 tablet  1  . fish oil-omega-3 fatty acids 1000 MG capsule Take 1 g by mouth daily.      . fosinopril (MONOPRIL) 20 MG tablet Take 1 tablet (20 mg total) by mouth daily.  90 tablet  6  . levocetirizine (XYZAL) 5 MG tablet TAKE 1 TABLET BY MOUTH EVERY EVENING  30 tablet  0  . loratadine (CLARITIN) 10 MG tablet Take 10 mg by mouth daily.      . montelukast (SINGULAIR) 10 MG tablet Take 1 tablet (10 mg total) by mouth daily.  90 tablet  6  . NEXIUM 40 MG capsule TAKE 1 CAPSULE BY MOUTH EVERY  DAY  90 capsule  3  . Tamsulosin HCl (FLOMAX) 0.4 MG CAPS Take 1 capsule (0.4 mg total) by mouth daily.  90 capsule  3  . timolol (BETIMOL) 0.5 % ophthalmic solution 1 drop 2 (two) times daily.       Marland Kitchen VYTORIN 10-20 MG per tablet TAKE 1 TABLET BY MOUTH DAILY  90 tablet  3    BP 140/80  Wt 167 lb (75.751 kg)    Review of Systems  Constitutional: Negative for fever, chills, appetite change and fatigue.  HENT: Negative for hearing loss, ear pain, congestion, sore throat, trouble swallowing, neck stiffness, dental problem, voice change and tinnitus.   Eyes: Negative for pain, discharge and visual disturbance.  Respiratory: Negative for cough, chest tightness, wheezing and stridor.   Cardiovascular: Negative for chest pain, palpitations and leg swelling.    Gastrointestinal: Negative for nausea, vomiting, abdominal pain, diarrhea, constipation, blood in stool and abdominal distention.  Genitourinary: Negative for urgency, hematuria, flank pain, discharge, difficulty urinating and genital sores.  Musculoskeletal: Negative for myalgias, back pain, joint swelling, arthralgias and gait problem.  Skin: Negative for rash.  Neurological: Negative for dizziness, syncope, speech difficulty, weakness, numbness and headaches.  Hematological: Negative for adenopathy. Does not bruise/bleed easily.  Psychiatric/Behavioral: Negative for behavioral problems and dysphoric mood. The patient is not nervous/anxious.        Objective:   Physical Exam  Constitutional: He appears well-developed and well-nourished. No distress.       Blood pressure A999333 systolic over Q000111Q diastolic both arms  Neck: Normal range of motion.  Cardiovascular: Normal rate and regular rhythm.   Musculoskeletal: He exhibits no edema.          Assessment & Plan:   Systolic HTN- improved but remains labile  No evidence of RAS CKD- stable  Nephrology consult ROV one month

## 2011-09-12 NOTE — Patient Instructions (Signed)
Renal consult as discussed  Limit your sodium (Salt) intake  Please check your blood pressure on a regular basis.  If it is consistently greater than 150/90, please make an office appointment.    It is important that you exercise regularly, at least 20 minutes 3 to 4 times per week.  If you develop chest pain or shortness of breath seek  medical attention.  Return in one month for followup

## 2011-09-15 ENCOUNTER — Telehealth: Payer: Self-pay | Admitting: Internal Medicine

## 2011-09-15 DIAGNOSIS — H348392 Tributary (branch) retinal vein occlusion, unspecified eye, stable: Secondary | ICD-10-CM | POA: Diagnosis not present

## 2011-09-15 DIAGNOSIS — H3581 Retinal edema: Secondary | ICD-10-CM | POA: Diagnosis not present

## 2011-09-15 NOTE — Telephone Encounter (Signed)
Pt spouse called and said that Dr Raliegh Ip was suppose to called to check on status of referral to Kidney doctor.

## 2011-09-19 ENCOUNTER — Ambulatory Visit: Payer: Medicare Other | Admitting: Internal Medicine

## 2011-10-16 ENCOUNTER — Encounter: Payer: Self-pay | Admitting: Internal Medicine

## 2011-10-16 ENCOUNTER — Ambulatory Visit (INDEPENDENT_AMBULATORY_CARE_PROVIDER_SITE_OTHER): Payer: Medicare Other | Admitting: Internal Medicine

## 2011-10-16 VITALS — BP 118/70 | Temp 97.7°F | Wt 165.0 lb

## 2011-10-16 DIAGNOSIS — Z23 Encounter for immunization: Secondary | ICD-10-CM | POA: Diagnosis not present

## 2011-10-16 DIAGNOSIS — I10 Essential (primary) hypertension: Secondary | ICD-10-CM | POA: Diagnosis not present

## 2011-10-16 NOTE — Patient Instructions (Addendum)
Limit your sodium (Salt) intake    It is important that you exercise regularly, at least 20 minutes 3 to 4 times per week.  If you develop chest pain or shortness of breath seek  medical attention.  Please check your blood pressure on a regular basis.  If it is consistently greater than 150/90, please make an office appointment.  

## 2011-10-16 NOTE — Progress Notes (Signed)
Subjective:    Patient ID: Alexander Mcdonald, male    DOB: 22-Oct-1926, 78 y.o.   MRN: [id number]  HPI  78 year old patient who is seen today for followup of hypertension. He is now on a combination of both Monopril and amlodipine 5. His blood pressure has been better controlled although he has not been tracking nearly as closely since his wife has been diagnosed with metastatic pancreatic cancer. He has had some mild dizziness but in general seems to be doing quite well.  Past Medical History  Diagnosis Date  . ACUTE RENAL FAILURE W/LESION OF TUBULAR NECROSIS 01/04/2007  . CAROTID ARTERY DISEASE 06/01/2009  . DEPRESSION 07/25/2006  . DIVERTICULOSIS OF COLON 02/01/2007  . GOUT 07/25/2006  . HEMORRHOID, THROMBOSED 02/15/2009  . HYPERCHOLESTEROLEMIA 07/25/2006  . HYPERTENSION 07/25/2006  . PROSTATE CANCER, HX OF 07/25/2006  . Rosacea 09/28/2008  . WEIGHT LOSS 09/01/2009  . Glaucoma     History   Social History  . Marital Status: Married    Spouse Name: N/A    Number of Children: N/A  . Years of Education: N/A   Occupational History  . Not on file.   Social History Main Topics  . Smoking status: Former Smoker    Quit date: 01/17/1988  . Smokeless tobacco: Never Used  . Alcohol Use: Yes  . Drug Use: No  . Sexually Active: Not on file   Other Topics Concern  . Not on file   Social History Narrative  . No narrative on file    Past Surgical History  Procedure Date  . Carotid endarterectomy   . Cataract extraction     No family history on file.  No Known Allergies  Current Outpatient Prescriptions on File Prior to Visit  Medication Sig Dispense Refill  . amLODipine (NORVASC) 5 MG tablet Take 1 tablet (5 mg total) by mouth daily.  90 tablet  3  . aspirin EC 81 MG tablet Take 1 tablet (81 mg total) by mouth daily.      . colchicine 0.6 MG tablet Take 2 tablets (1.2 mg total) by mouth once. May repeat one additional tablet in 1 hour  (3 tabs per day max)  6 tablet  1  . fish  oil-omega-3 fatty acids 1000 MG capsule Take 1 g by mouth daily.      . fosinopril (MONOPRIL) 20 MG tablet Take 1 tablet (20 mg total) by mouth daily.  90 tablet  6  . levocetirizine (XYZAL) 5 MG tablet TAKE 1 TABLET BY MOUTH EVERY EVENING  30 tablet  0  . loratadine (CLARITIN) 10 MG tablet Take 10 mg by mouth daily.      . montelukast (SINGULAIR) 10 MG tablet Take 1 tablet (10 mg total) by mouth daily.  90 tablet  6  . NEXIUM 40 MG capsule TAKE 1 CAPSULE BY MOUTH EVERY DAY  90 capsule  3  . Tamsulosin HCl (FLOMAX) 0.4 MG CAPS Take 1 capsule (0.4 mg total) by mouth daily.  90 capsule  3  . timolol (BETIMOL) 0.5 % ophthalmic solution 1 drop 2 (two) times daily.       Marland Kitchen VYTORIN 10-20 MG per tablet TAKE 1 TABLET BY MOUTH DAILY  90 tablet  3    BP 118/70  Temp 97.7 F (36.5 C) (Oral)  Wt 165 lb (74.844 kg)       Review of Systems  Constitutional: Negative for fever, chills, appetite change and fatigue.  HENT: Negative for hearing loss, ear pain,  congestion, sore throat, trouble swallowing, neck stiffness, dental problem, voice change and tinnitus.   Eyes: Negative for pain, discharge and visual disturbance.  Respiratory: Negative for cough, chest tightness, wheezing and stridor.   Cardiovascular: Negative for chest pain, palpitations and leg swelling.  Gastrointestinal: Negative for nausea, vomiting, abdominal pain, diarrhea, constipation, blood in stool and abdominal distention.  Genitourinary: Negative for urgency, hematuria, flank pain, discharge, difficulty urinating and genital sores.  Musculoskeletal: Negative for myalgias, back pain, joint swelling, arthralgias and gait problem.  Skin: Negative for rash.  Neurological: Positive for light-headedness. Negative for dizziness, syncope, speech difficulty, weakness, numbness and headaches.  Hematological: Negative for adenopathy. Does not bruise/bleed easily.  Psychiatric/Behavioral: Negative for behavioral problems and dysphoric mood.  The patient is nervous/anxious.        Objective:   Physical Exam  Constitutional: He appears well-developed and well-nourished. No distress.       Blood pressure 130/70  Cardiovascular: Normal rate, regular rhythm and normal heart sounds.   Pulmonary/Chest: Effort normal. No respiratory distress. He has no wheezes. He has no rales.  Psychiatric: He has a normal mood and affect. His behavior is normal.          Assessment & Plan:    Hypertension improved. Continue same regimen low salt diet recommended. Home blood pressure monitoring recommended Recheck 4 months or as needed

## 2011-10-17 DIAGNOSIS — H348192 Central retinal vein occlusion, unspecified eye, stable: Secondary | ICD-10-CM | POA: Diagnosis not present

## 2011-10-17 DIAGNOSIS — H4011X0 Primary open-angle glaucoma, stage unspecified: Secondary | ICD-10-CM | POA: Diagnosis not present

## 2011-10-17 DIAGNOSIS — H3530 Unspecified macular degeneration: Secondary | ICD-10-CM | POA: Diagnosis not present

## 2011-10-17 DIAGNOSIS — H409 Unspecified glaucoma: Secondary | ICD-10-CM | POA: Diagnosis not present

## 2011-10-17 DIAGNOSIS — H35359 Cystoid macular degeneration, unspecified eye: Secondary | ICD-10-CM | POA: Diagnosis not present

## 2011-10-17 DIAGNOSIS — H35059 Retinal neovascularization, unspecified, unspecified eye: Secondary | ICD-10-CM | POA: Diagnosis not present

## 2011-10-17 DIAGNOSIS — H35039 Hypertensive retinopathy, unspecified eye: Secondary | ICD-10-CM | POA: Diagnosis not present

## 2011-10-17 DIAGNOSIS — H348392 Tributary (branch) retinal vein occlusion, unspecified eye, stable: Secondary | ICD-10-CM | POA: Diagnosis not present

## 2011-10-28 ENCOUNTER — Emergency Department (HOSPITAL_COMMUNITY)
Admission: EM | Admit: 2011-10-28 | Discharge: 2011-10-28 | Disposition: A | Discharge status: Home / Self Care | Payer: Medicare Other | Attending: Emergency Medicine | Admitting: Emergency Medicine

## 2011-10-28 ENCOUNTER — Encounter (HOSPITAL_COMMUNITY): Payer: Self-pay | Admitting: Emergency Medicine

## 2011-10-28 DIAGNOSIS — I2510 Atherosclerotic heart disease of native coronary artery without angina pectoris: Secondary | ICD-10-CM | POA: Insufficient documentation

## 2011-10-28 DIAGNOSIS — Z91013 Allergy to seafood: Secondary | ICD-10-CM | POA: Insufficient documentation

## 2011-10-28 DIAGNOSIS — F329 Major depressive disorder, single episode, unspecified: Secondary | ICD-10-CM | POA: Insufficient documentation

## 2011-10-28 DIAGNOSIS — Z87891 Personal history of nicotine dependence: Secondary | ICD-10-CM | POA: Diagnosis not present

## 2011-10-28 DIAGNOSIS — Z8546 Personal history of malignant neoplasm of prostate: Secondary | ICD-10-CM | POA: Insufficient documentation

## 2011-10-28 DIAGNOSIS — R339 Retention of urine, unspecified: Secondary | ICD-10-CM | POA: Insufficient documentation

## 2011-10-28 DIAGNOSIS — I10 Essential (primary) hypertension: Secondary | ICD-10-CM | POA: Diagnosis not present

## 2011-10-28 DIAGNOSIS — F3289 Other specified depressive episodes: Secondary | ICD-10-CM | POA: Insufficient documentation

## 2011-10-28 LAB — BASIC METABOLIC PANEL
BUN: 25 mg/dL — ABNORMAL HIGH (ref 6–23)
CO2: 25 mEq/L (ref 19–32)
Calcium: 9.1 mg/dL (ref 8.4–10.5)
Chloride: 96 mEq/L (ref 96–112)
Creatinine, Ser: 1.8 mg/dL — ABNORMAL HIGH (ref 0.50–1.35)
GFR calc Af Amer: 38 mL/min — ABNORMAL LOW (ref >90)
GFR calc non Af Amer: 33 mL/min — ABNORMAL LOW (ref >90)
Glucose, Bld: 114 mg/dL — ABNORMAL HIGH (ref 70–99)
Potassium: 4 mEq/L (ref 3.5–5.1)
Sodium: 130 mEq/L — ABNORMAL LOW (ref 135–145)

## 2011-10-28 LAB — URINALYSIS, ROUTINE W REFLEX MICROSCOPIC
Bilirubin Urine: NEGATIVE
Glucose, UA: NEGATIVE mg/dL
Ketones, ur: NEGATIVE mg/dL
Nitrite: NEGATIVE
Protein, ur: 30 mg/dL — AB
Specific Gravity, Urine: 1.016 (ref 1.005–1.030)
Urobilinogen, UA: 1 mg/dL (ref 0.0–1.0)
pH: 6 (ref 5.0–8.0)

## 2011-10-28 LAB — CBC
HCT: 36.6 % — ABNORMAL LOW (ref 39.0–52.0)
Hemoglobin: 13 g/dL (ref 13.0–17.0)
MCH: 30.1 pg (ref 26.0–34.0)
MCHC: 35.5 g/dL (ref 30.0–36.0)
MCV: 84.7 fL (ref 78.0–100.0)
Platelets: 134 10*3/uL — ABNORMAL LOW (ref 150–400)
RBC: 4.32 MIL/uL (ref 4.22–5.81)
RDW: 13.8 % (ref 11.5–15.5)
WBC: 15 10*3/uL — ABNORMAL HIGH (ref 4.0–10.5)

## 2011-10-28 LAB — URINE MICROSCOPIC-ADD ON

## 2011-10-28 MED ORDER — ONDANSETRON HCL 4 MG/2ML IJ SOLN: 4.0000 mg | Freq: Once | INTRAMUSCULAR | Status: DC

## 2011-10-28 NOTE — ED Provider Notes (Signed)
Medical screening examination/treatment/procedure(s) were conducted as a shared visit with non-physician practitioner(s) and myself.  I personally evaluated the patient during the encounter  Pt without signs of UTI.  No prostate ttp.  No urinary retention noted.  Will send off urine culture.  Follow up with urologist.  Kathalene Frames, MD 10/28/11 [phone]

## 2011-10-28 NOTE — ED Provider Notes (Signed)
History     CSN: [id number]  Arrival date & time 10/28/11  1129   First MD Initiated Contact with Patient 10/28/11 1152      Chief Complaint  Patient presents with  . Urinary Retention    (Consider location/radiation/quality/duration/timing/severity/associated sxs/prior treatment) HPI Comments: Patient with history of prostate cancer, HTN, and CAD presents with 2 day history of inability to pass urine. He has to strain to urinate but only produces a few drops at a time. Denies any blood in the urine. He feels the urge to urinate every 15-30 minutes. Patient has a history of urinary retention, the last episode about a year ago. It has previously been due to dehydration with concomitant UTI. He states he only drinks about 24 ounces of fluid per day. Prostate cancer was approximately 10 years ago and was solely treated with medication. PSA is checked annually by Dr. Karsten Ro and was normal at the last check up about a year ago. Patient denies fever, back pain, abdominal pain, nausea, vomiting, or diarrhea. He does not have a history of kidney stones.  The history is provided by the patient and a relative.    Past Medical History  Diagnosis Date  . ACUTE RENAL FAILURE W/LESION OF TUBULAR NECROSIS 01/04/2007  . CAROTID ARTERY DISEASE 06/01/2009  . DEPRESSION 07/25/2006  . DIVERTICULOSIS OF COLON 02/01/2007  . GOUT 07/25/2006  . HEMORRHOID, THROMBOSED 02/15/2009  . HYPERCHOLESTEROLEMIA 07/25/2006  . HYPERTENSION 07/25/2006  . PROSTATE CANCER, HX OF 07/25/2006  . Rosacea 09/28/2008  . WEIGHT LOSS 09/01/2009  . Glaucoma(365)     Past Surgical History  Procedure Date  . Carotid endarterectomy   . Cataract extraction     History reviewed. No pertinent family history.  History  Substance Use Topics  . Smoking status: Former Smoker    Quit date: 01/17/1988  . Smokeless tobacco: Never Used  . Alcohol Use: Yes      Review of Systems  Constitutional: Negative for fever and appetite change.    Gastrointestinal: Negative for nausea, vomiting, abdominal pain and diarrhea.  Genitourinary: Positive for urgency, frequency, decreased urine volume and difficulty urinating. Negative for flank pain.    Allergies  Shellfish allergy  Home Medications   Current Outpatient Rx  Name Route Sig Dispense Refill  . AMLODIPINE BESYLATE 5 MG PO TABS Oral Take 5 mg by mouth daily.    . ASPIRIN EC 81 MG PO TBEC Oral Take 81 mg by mouth daily.    . COLCHICINE 0.6 MG PO TABS Oral Take 1.2 mg by mouth once. May repeat one additional tablet in 1 hour  (3 tabs per day max)    . ESOMEPRAZOLE MAGNESIUM 40 MG PO CPDR Oral Take 40 mg by mouth daily before breakfast.    . EZETIMIBE-SIMVASTATIN 10-20 MG PO TABS      . OMEGA-3 FATTY ACIDS 1000 MG PO CAPS Oral Take 1 g by mouth daily.    . FOSINOPRIL SODIUM 20 MG PO TABS Oral Take 20 mg by mouth daily.    Marland Kitchen LEVOCETIRIZINE DIHYDROCHLORIDE 5 MG PO TABS Oral Take 5 mg by mouth every evening.    Marland Kitchen LORATADINE 10 MG PO TABS Oral Take 10 mg by mouth daily.    Marland Kitchen MONTELUKAST SODIUM 10 MG PO TABS Oral Take 10 mg by mouth at bedtime.    . TAMSULOSIN HCL 0.4 MG PO CAPS Oral Take 0.4 mg by mouth daily.    Marland Kitchen TIMOLOL HEMIHYDRATE 0.5 % OP SOLN  1 drop  2 (two) times daily.       BP 117/56  Pulse 73  Temp 98.6 F (37 C)  Resp 18  Physical Exam  Nursing note and vitals reviewed. Constitutional: He is oriented to person, place, and time. He appears well-developed and well-nourished. No distress.  HENT:  Head: Normocephalic and atraumatic.  Eyes: Conjunctivae normal and EOM are normal.  Neck: Normal range of motion.  Cardiovascular: Normal rate and regular rhythm.   Pulmonary/Chest: Effort normal and breath sounds normal.  Abdominal: Soft. Bowel sounds are normal. He exhibits no distension and no mass. There is no tenderness.  Genitourinary:       Rectal exam chaperoned. Prostate large and firm. No palpable bogginess or ttp  Musculoskeletal: Normal range of motion.   Neurological: He is alert and oriented to person, place, and time.  Skin: Skin is warm and dry. No rash noted. He is not diaphoretic.  Psychiatric: He has a normal mood and affect. His behavior is normal.    ED Course  Procedures (including critical care time)  Labs Reviewed  URINALYSIS, ROUTINE W REFLEX MICROSCOPIC - Abnormal; Notable for the following:    Color, Urine AMBER (*)  BIOCHEMICALS MAY BE AFFECTED BY COLOR   Hgb urine dipstick SMALL (*)     Protein, ur 30 (*)     Leukocytes, UA SMALL (*)     All other components within normal limits  BASIC METABOLIC PANEL - Abnormal; Notable for the following:    Sodium 130 (*)     Glucose, Bld 114 (*)     BUN 25 (*)     Creatinine, Ser 1.80 (*)     GFR calc non Af Amer 33 (*)     GFR calc Af Amer 38 (*)     All other components within normal limits  CBC - Abnormal; Notable for the following:    WBC 15.0 (*)     HCT 36.6 (*)     Platelets 134 (*)     All other components within normal limits  URINE MICROSCOPIC-ADD ON  URINE CULTURE   No results found.   No diagnosis found.    MDM  Urinary retention  Patient with a history of prostate cancer presented to the emergency department complaining of urinary retention with onset of 3 days ago.  Patient has a history of UTIs in reviewing labs and this did not seem evident today.  A urinary culture pending.  Rectal exam performed and non-concerning for prostatitis.  Recommended followup with the patient's urologist as soon as possible and patient is aware of pending cultures.  Patient is in no acute distress prior to discharge and confirms relief of abdominal pressure after Foley placement.  Patient has been seen with Dr. Tomi Bamberger who agrees with plan to discharge patient.        Verl Dicker, [place] 10/28/11 1414

## 2011-10-28 NOTE — ED Notes (Signed)
Daughter reports her Father has unable to void just few drops for 2-3 days has a  Hx of this & dx UTI's. Patient denies pain.

## 2011-10-30 DIAGNOSIS — R3915 Urgency of urination: Secondary | ICD-10-CM | POA: Diagnosis not present

## 2011-10-30 DIAGNOSIS — R300 Dysuria: Secondary | ICD-10-CM | POA: Diagnosis not present

## 2011-10-30 DIAGNOSIS — N401 Enlarged prostate with lower urinary tract symptoms: Secondary | ICD-10-CM | POA: Diagnosis not present

## 2011-10-31 LAB — URINE CULTURE: Colony Count: 3000

## 2011-11-03 DIAGNOSIS — R82998 Other abnormal findings in urine: Secondary | ICD-10-CM | POA: Diagnosis not present

## 2011-11-03 DIAGNOSIS — R351 Nocturia: Secondary | ICD-10-CM | POA: Diagnosis not present

## 2011-11-03 DIAGNOSIS — R350 Frequency of micturition: Secondary | ICD-10-CM | POA: Diagnosis not present

## 2011-11-03 DIAGNOSIS — N3000 Acute cystitis without hematuria: Secondary | ICD-10-CM | POA: Diagnosis not present

## 2011-11-20 ENCOUNTER — Ambulatory Visit: Payer: Medicare Other | Admitting: Internal Medicine

## 2011-11-23 ENCOUNTER — Telehealth: Payer: Self-pay

## 2011-11-23 MED ORDER — EZETIMIBE-SIMVASTATIN 10-20 MG PO TABS: 1.0000 | ORAL_TABLET | Freq: Every day | ORAL | Status: DC

## 2011-11-23 NOTE — Telephone Encounter (Signed)
done

## 2011-11-30 DIAGNOSIS — N183 Chronic kidney disease, stage 3 unspecified: Secondary | ICD-10-CM | POA: Diagnosis not present

## 2011-11-30 DIAGNOSIS — I10 Essential (primary) hypertension: Secondary | ICD-10-CM | POA: Diagnosis not present

## 2011-11-30 DIAGNOSIS — I951 Orthostatic hypotension: Secondary | ICD-10-CM | POA: Diagnosis not present

## 2011-12-19 DIAGNOSIS — H409 Unspecified glaucoma: Secondary | ICD-10-CM | POA: Diagnosis not present

## 2011-12-19 DIAGNOSIS — H348392 Tributary (branch) retinal vein occlusion, unspecified eye, stable: Secondary | ICD-10-CM | POA: Diagnosis not present

## 2011-12-19 DIAGNOSIS — H4011X0 Primary open-angle glaucoma, stage unspecified: Secondary | ICD-10-CM | POA: Diagnosis not present

## 2011-12-19 DIAGNOSIS — H3530 Unspecified macular degeneration: Secondary | ICD-10-CM | POA: Diagnosis not present

## 2011-12-19 DIAGNOSIS — H35359 Cystoid macular degeneration, unspecified eye: Secondary | ICD-10-CM | POA: Diagnosis not present

## 2011-12-19 DIAGNOSIS — H348192 Central retinal vein occlusion, unspecified eye, stable: Secondary | ICD-10-CM | POA: Diagnosis not present

## 2011-12-19 DIAGNOSIS — H35059 Retinal neovascularization, unspecified, unspecified eye: Secondary | ICD-10-CM | POA: Diagnosis not present

## 2011-12-25 ENCOUNTER — Encounter (HOSPITAL_COMMUNITY): Payer: Self-pay | Admitting: Emergency Medicine

## 2011-12-25 ENCOUNTER — Inpatient Hospital Stay (HOSPITAL_COMMUNITY)
Admission: EM | Admit: 2011-12-25 | Discharge: 2012-01-02 | DRG: 193 | Disposition: A | Discharge status: Home Health | Payer: Medicare Other | Attending: Internal Medicine | Admitting: Internal Medicine

## 2011-12-25 ENCOUNTER — Emergency Department (HOSPITAL_COMMUNITY): Payer: Medicare Other

## 2011-12-25 DIAGNOSIS — M6282 Rhabdomyolysis: Secondary | ICD-10-CM

## 2011-12-25 DIAGNOSIS — R509 Fever, unspecified: Secondary | ICD-10-CM

## 2011-12-25 DIAGNOSIS — D649 Anemia, unspecified: Secondary | ICD-10-CM

## 2011-12-25 DIAGNOSIS — R627 Adult failure to thrive: Secondary | ICD-10-CM | POA: Diagnosis present

## 2011-12-25 DIAGNOSIS — I6529 Occlusion and stenosis of unspecified carotid artery: Secondary | ICD-10-CM | POA: Diagnosis not present

## 2011-12-25 DIAGNOSIS — F3289 Other specified depressive episodes: Secondary | ICD-10-CM | POA: Diagnosis not present

## 2011-12-25 DIAGNOSIS — J9600 Acute respiratory failure, unspecified whether with hypoxia or hypercapnia: Secondary | ICD-10-CM | POA: Diagnosis present

## 2011-12-25 DIAGNOSIS — I10 Essential (primary) hypertension: Secondary | ICD-10-CM

## 2011-12-25 DIAGNOSIS — J111 Influenza due to unidentified influenza virus with other respiratory manifestations: Secondary | ICD-10-CM

## 2011-12-25 DIAGNOSIS — I2510 Atherosclerotic heart disease of native coronary artery without angina pectoris: Secondary | ICD-10-CM | POA: Diagnosis present

## 2011-12-25 DIAGNOSIS — R6510 Systemic inflammatory response syndrome (SIRS) of non-infectious origin without acute organ dysfunction: Secondary | ICD-10-CM

## 2011-12-25 DIAGNOSIS — R634 Abnormal weight loss: Secondary | ICD-10-CM

## 2011-12-25 DIAGNOSIS — I517 Cardiomegaly: Secondary | ICD-10-CM | POA: Diagnosis not present

## 2011-12-25 DIAGNOSIS — J189 Pneumonia, unspecified organism: Principal | ICD-10-CM

## 2011-12-25 DIAGNOSIS — R339 Retention of urine, unspecified: Secondary | ICD-10-CM | POA: Diagnosis not present

## 2011-12-25 DIAGNOSIS — L719 Rosacea, unspecified: Secondary | ICD-10-CM

## 2011-12-25 DIAGNOSIS — B957 Other staphylococcus as the cause of diseases classified elsewhere: Secondary | ICD-10-CM

## 2011-12-25 DIAGNOSIS — R531 Weakness: Secondary | ICD-10-CM

## 2011-12-25 DIAGNOSIS — E7800 Pure hypercholesterolemia, unspecified: Secondary | ICD-10-CM

## 2011-12-25 DIAGNOSIS — R404 Transient alteration of awareness: Secondary | ICD-10-CM | POA: Diagnosis not present

## 2011-12-25 DIAGNOSIS — F329 Major depressive disorder, single episode, unspecified: Secondary | ICD-10-CM | POA: Diagnosis present

## 2011-12-25 DIAGNOSIS — E785 Hyperlipidemia, unspecified: Secondary | ICD-10-CM | POA: Diagnosis present

## 2011-12-25 DIAGNOSIS — Z8546 Personal history of malignant neoplasm of prostate: Secondary | ICD-10-CM

## 2011-12-25 DIAGNOSIS — R5381 Other malaise: Secondary | ICD-10-CM | POA: Diagnosis present

## 2011-12-25 DIAGNOSIS — R7881 Bacteremia: Secondary | ICD-10-CM

## 2011-12-25 DIAGNOSIS — I129 Hypertensive chronic kidney disease with stage 1 through stage 4 chronic kidney disease, or unspecified chronic kidney disease: Secondary | ICD-10-CM | POA: Diagnosis present

## 2011-12-25 DIAGNOSIS — N189 Chronic kidney disease, unspecified: Secondary | ICD-10-CM | POA: Diagnosis present

## 2011-12-25 DIAGNOSIS — R0602 Shortness of breath: Secondary | ICD-10-CM | POA: Diagnosis not present

## 2011-12-25 DIAGNOSIS — M109 Gout, unspecified: Secondary | ICD-10-CM | POA: Diagnosis present

## 2011-12-25 DIAGNOSIS — N170 Acute kidney failure with tubular necrosis: Secondary | ICD-10-CM | POA: Diagnosis not present

## 2011-12-25 DIAGNOSIS — K5730 Diverticulosis of large intestine without perforation or abscess without bleeding: Secondary | ICD-10-CM

## 2011-12-25 DIAGNOSIS — N179 Acute kidney failure, unspecified: Secondary | ICD-10-CM | POA: Diagnosis not present

## 2011-12-25 DIAGNOSIS — H409 Unspecified glaucoma: Secondary | ICD-10-CM | POA: Diagnosis present

## 2011-12-25 DIAGNOSIS — K645 Perianal venous thrombosis: Secondary | ICD-10-CM

## 2011-12-25 DIAGNOSIS — E871 Hypo-osmolality and hyponatremia: Secondary | ICD-10-CM | POA: Diagnosis present

## 2011-12-25 DIAGNOSIS — R5383 Other fatigue: Secondary | ICD-10-CM | POA: Diagnosis not present

## 2011-12-25 LAB — CK
Total CK: 272 U/L — ABNORMAL HIGH (ref 7–232)
Total CK: 1043 U/L — ABNORMAL HIGH (ref 7–232)

## 2011-12-25 LAB — COMPREHENSIVE METABOLIC PANEL
ALT: 50 U/L (ref 0–53)
AST: 70 U/L — ABNORMAL HIGH (ref 0–37)
Albumin: 3.2 g/dL — ABNORMAL LOW (ref 3.5–5.2)
Alkaline Phosphatase: 99 U/L (ref 39–117)
BUN: 26 mg/dL — ABNORMAL HIGH (ref 6–23)
CO2: 20 mEq/L (ref 19–32)
Calcium: 8.9 mg/dL (ref 8.4–10.5)
Chloride: 96 mEq/L (ref 96–112)
Creatinine, Ser: 2.01 mg/dL — ABNORMAL HIGH (ref 0.50–1.35)
GFR calc Af Amer: 34 mL/min — ABNORMAL LOW (ref >90)
GFR calc non Af Amer: 29 mL/min — ABNORMAL LOW (ref >90)
Glucose, Bld: 139 mg/dL — ABNORMAL HIGH (ref 70–99)
Potassium: 3.7 mEq/L (ref 3.5–5.1)
Sodium: 130 mEq/L — ABNORMAL LOW (ref 135–145)
Total Bilirubin: 1.9 mg/dL — ABNORMAL HIGH (ref 0.3–1.2)
Total Protein: 7 g/dL (ref 6.0–8.3)

## 2011-12-25 LAB — CBC
HCT: 14.6 % — ABNORMAL LOW (ref 39.0–52.0)
Hemoglobin: 5.2 g/dL — CL (ref 13.0–17.0)
MCH: 30.4 pg (ref 26.0–34.0)
MCHC: 35.6 g/dL (ref 30.0–36.0)
MCV: 85.4 fL (ref 78.0–100.0)
Platelets: 260 10*3/uL (ref 150–400)
RBC: 1.71 MIL/uL — ABNORMAL LOW (ref 4.22–5.81)
RDW: 14.5 % (ref 11.5–15.5)
WBC: 16.1 10*3/uL — ABNORMAL HIGH (ref 4.0–10.5)

## 2011-12-25 LAB — URINALYSIS, ROUTINE W REFLEX MICROSCOPIC
Glucose, UA: NEGATIVE mg/dL
Ketones, ur: NEGATIVE mg/dL
Leukocytes, UA: NEGATIVE
Nitrite: NEGATIVE
Protein, ur: 100 mg/dL — AB
Specific Gravity, Urine: 1.021 (ref 1.005–1.030)
Urobilinogen, UA: 1 mg/dL (ref 0.0–1.0)
pH: 7.5 (ref 5.0–8.0)

## 2011-12-25 LAB — INFLUENZA PANEL BY PCR (TYPE A & B)
H1N1 flu by pcr: NOT DETECTED
Influenza A By PCR: NEGATIVE
Influenza B By PCR: NEGATIVE

## 2011-12-25 LAB — CBC WITH DIFFERENTIAL/PLATELET
Basophils Absolute: 0 10*3/uL (ref 0.0–0.1)
Basophils Absolute: 0 10*3/uL (ref 0.0–0.1)
Basophils Relative: 0 % (ref 0–1)
Basophils Relative: 0 % (ref 0–1)
Eosinophils Absolute: 0 10*3/uL (ref 0.0–0.7)
Eosinophils Absolute: 0 10*3/uL (ref 0.0–0.7)
Eosinophils Relative: 0 % (ref 0–5)
Eosinophils Relative: 0 % (ref 0–5)
HCT: 31.1 % — ABNORMAL LOW (ref 39.0–52.0)
HCT: 33.8 % — ABNORMAL LOW (ref 39.0–52.0)
Hemoglobin: 11 g/dL — ABNORMAL LOW (ref 13.0–17.0)
Hemoglobin: 11.7 g/dL — ABNORMAL LOW (ref 13.0–17.0)
Lymphocytes Relative: 11 % — ABNORMAL LOW (ref 12–46)
Lymphocytes Relative: 14 % (ref 12–46)
Lymphs Abs: 1 10*3/uL (ref 0.7–4.0)
Lymphs Abs: 1.3 10*3/uL (ref 0.7–4.0)
MCH: 29.8 pg (ref 26.0–34.0)
MCH: 29.6 pg (ref 26.0–34.0)
MCHC: 35.4 g/dL (ref 30.0–36.0)
MCHC: 34.6 g/dL (ref 30.0–36.0)
MCV: 84.3 fL (ref 78.0–100.0)
MCV: 85.6 fL (ref 78.0–100.0)
Monocytes Absolute: 1 10*3/uL (ref 0.1–1.0)
Monocytes Absolute: 0.8 10*3/uL (ref 0.1–1.0)
Monocytes Relative: 11 % (ref 3–12)
Monocytes Relative: 9 % (ref 3–12)
Neutro Abs: 7.3 10*3/uL (ref 1.7–7.7)
Neutro Abs: 6.8 10*3/uL (ref 1.7–7.7)
Neutrophils Relative %: 78 % — ABNORMAL HIGH (ref 43–77)
Neutrophils Relative %: 76 % (ref 43–77)
Platelets: 148 10*3/uL — ABNORMAL LOW (ref 150–400)
Platelets: 148 10*3/uL — ABNORMAL LOW (ref 150–400)
RBC: 3.69 MIL/uL — ABNORMAL LOW (ref 4.22–5.81)
RBC: 3.95 MIL/uL — ABNORMAL LOW (ref 4.22–5.81)
RDW: 14.6 % (ref 11.5–15.5)
RDW: 14.6 % (ref 11.5–15.5)
WBC: 9.3 10*3/uL (ref 4.0–10.5)
WBC: 8.9 10*3/uL (ref 4.0–10.5)

## 2011-12-25 LAB — LACTIC ACID, PLASMA: Lactic Acid, Venous: 1.1 mmol/L (ref 0.5–2.2)

## 2011-12-25 LAB — BASIC METABOLIC PANEL
BUN: 22 mg/dL (ref 6–23)
CO2: 22 mEq/L (ref 19–32)
Calcium: 8.4 mg/dL (ref 8.4–10.5)
Chloride: 102 mEq/L (ref 96–112)
Creatinine, Ser: 1.72 mg/dL — ABNORMAL HIGH (ref 0.50–1.35)
GFR calc Af Amer: 41 mL/min — ABNORMAL LOW (ref >90)
GFR calc non Af Amer: 35 mL/min — ABNORMAL LOW (ref >90)
Glucose, Bld: 108 mg/dL — ABNORMAL HIGH (ref 70–99)
Potassium: 3.8 mEq/L (ref 3.5–5.1)
Sodium: 134 mEq/L — ABNORMAL LOW (ref 135–145)

## 2011-12-25 LAB — URINE MICROSCOPIC-ADD ON

## 2011-12-25 LAB — TYPE AND SCREEN
ABO/RH(D): A NEG
Antibody Screen: NEGATIVE

## 2011-12-25 LAB — LACTATE DEHYDROGENASE: LDH: 156 U/L (ref 94–250)

## 2011-12-25 LAB — TROPONIN I
Troponin I: <0.3 ng/mL (ref <0.30)
Troponin I: <0.3 ng/mL (ref <0.30)

## 2011-12-25 LAB — ABO/RH: ABO/RH(D): A NEG

## 2011-12-25 LAB — OCCULT BLOOD, POC DEVICE
Fecal Occult Bld: NEGATIVE
Fecal Occult Bld: NEGATIVE

## 2011-12-25 MED ORDER — EZETIMIBE-SIMVASTATIN 10-20 MG PO TABS
1.0000 | ORAL_TABLET | Freq: Every day | ORAL | Status: DC
  Filled 2011-12-25: qty 1

## 2011-12-25 MED ORDER — ACETAMINOPHEN 325 MG PO TABS
650.0000 mg | ORAL_TABLET | Freq: Four times a day (QID) | ORAL | Status: DC | PRN
  Administered 2011-12-25 – 2011-12-27 (×4): 650 mg via ORAL
  Filled 2011-12-25 (×3): qty 2

## 2011-12-25 MED ORDER — ONDANSETRON HCL 4 MG/2ML IJ SOLN: 4.0000 mg | Freq: Four times a day (QID) | INTRAMUSCULAR | Status: DC | PRN

## 2011-12-25 MED ORDER — SODIUM CHLORIDE 0.9 % IV SOLN
INTRAVENOUS | Status: DC
  Administered 2011-12-25: 02:00:00 via INTRAVENOUS

## 2011-12-25 MED ORDER — SODIUM CHLORIDE 0.9 % IJ SOLN
3.0000 mL | Freq: Two times a day (BID) | INTRAMUSCULAR | Status: DC
  Administered 2011-12-26 – 2012-01-02 (×5): 3 mL via INTRAVENOUS

## 2011-12-25 MED ORDER — TAMSULOSIN HCL 0.4 MG PO CAPS
0.4000 mg | ORAL_CAPSULE | Freq: Every day | ORAL | Status: DC
  Administered 2011-12-25 – 2012-01-02 (×9): 0.4 mg via ORAL
  Filled 2011-12-25 (×9): qty 1

## 2011-12-25 MED ORDER — ONDANSETRON HCL 4 MG PO TABS: 4.0000 mg | ORAL_TABLET | Freq: Four times a day (QID) | ORAL | Status: DC | PRN

## 2011-12-25 MED ORDER — OMEGA-3-ACID ETHYL ESTERS 1 G PO CAPS
1.0000 g | ORAL_CAPSULE | Freq: Every day | ORAL | Status: DC
  Administered 2011-12-26 – 2012-01-02 (×8): 1 g via ORAL
  Filled 2011-12-25 (×9): qty 1

## 2011-12-25 MED ORDER — OMEGA-3 FATTY ACIDS 1000 MG PO CAPS
1.0000 g | ORAL_CAPSULE | Freq: Every day | ORAL | Status: DC
Start: 2011-12-25 — End: 2011-12-25

## 2011-12-25 MED ORDER — ACETAMINOPHEN 325 MG PO TABS
650.0000 mg | ORAL_TABLET | Freq: Once | ORAL | Status: AC
  Administered 2011-12-25: 650 mg via ORAL
  Filled 2011-12-25: qty 2

## 2011-12-25 MED ORDER — ACETAMINOPHEN 650 MG RE SUPP: 650.0000 mg | Freq: Four times a day (QID) | RECTAL | Status: DC | PRN

## 2011-12-25 MED ORDER — SODIUM CHLORIDE 0.9 % IV SOLN
INTRAVENOUS | Status: AC
  Administered 2011-12-25: 11:00:00 via INTRAVENOUS

## 2011-12-25 MED ORDER — TIMOLOL HEMIHYDRATE 0.5 % OP SOLN: 1.0000 [drp] | Freq: Two times a day (BID) | OPHTHALMIC | Status: DC

## 2011-12-25 MED ORDER — OSELTAMIVIR PHOSPHATE 75 MG PO CAPS
75.0000 mg | ORAL_CAPSULE | Freq: Once | ORAL | Status: AC
  Administered 2011-12-25: 75 mg via ORAL
  Filled 2011-12-25: qty 1

## 2011-12-25 MED ORDER — LEVALBUTEROL HCL 0.63 MG/3ML IN NEBU
0.6300 mg | INHALATION_SOLUTION | Freq: Four times a day (QID) | RESPIRATORY_TRACT | Status: DC | PRN
  Administered 2011-12-25 – 2011-12-26 (×4): 0.63 mg via RESPIRATORY_TRACT
  Filled 2011-12-25 (×6): qty 3

## 2011-12-25 MED ORDER — PANTOPRAZOLE SODIUM 40 MG PO TBEC
40.0000 mg | DELAYED_RELEASE_TABLET | Freq: Every day | ORAL | Status: DC
  Administered 2011-12-26 – 2012-01-02 (×8): 40 mg via ORAL
  Filled 2011-12-25 (×9): qty 1

## 2011-12-25 MED ORDER — AMLODIPINE BESYLATE 5 MG PO TABS
5.0000 mg | ORAL_TABLET | Freq: Every day | ORAL | Status: DC
  Administered 2011-12-25 – 2012-01-02 (×9): 5 mg via ORAL
  Filled 2011-12-25 (×9): qty 1

## 2011-12-25 MED ORDER — LISINOPRIL 20 MG PO TABS
20.0000 mg | ORAL_TABLET | Freq: Every day | ORAL | Status: DC
  Administered 2011-12-25 – 2012-01-01 (×8): 20 mg via ORAL
  Filled 2011-12-25 (×8): qty 1

## 2011-12-25 MED ORDER — TIMOLOL MALEATE 0.5 % OP SOLN
1.0000 [drp] | Freq: Two times a day (BID) | OPHTHALMIC | Status: DC
  Administered 2011-12-25 – 2012-01-02 (×17): 1 [drp] via OPHTHALMIC
  Filled 2011-12-25: qty 5

## 2011-12-25 MED ORDER — OMEGA-3 FATTY ACIDS 1000 MG PO CAPS: 1.0000 g | ORAL_CAPSULE | Freq: Every day | ORAL | Status: DC

## 2011-12-25 NOTE — Progress Notes (Signed)
   CARE MANAGEMENT NOTE 12/25/2011  Patient:  TUF, LATONA   Account Number:  [id number]  Date Initiated:  12/25/2011  Documentation initiated by:  Olga Coaster  Subjective/Objective Assessment:   ADMITTED WITH WEAKNESS, ? FLU     Action/Plan:   PCP - Dr. Beverely Risen.  LIVES WITH SPOUSE   Anticipated DC Date:  01/01/2012   Anticipated DC Plan:  [facility]  CM consult          Status of service:  In process, will continue to follow Medicare Important Message given?  NA - LOS <3 / Initial given by admissions (If response is "NO", the following Medicare IM given date fields will be blank)  Per UR Regulation:  Reviewed for med. necessity/level of care/duration of stay  Comments:  12/25/2011- B Shaleah Nissley RN,BSN,MHA

## 2011-12-25 NOTE — ED Notes (Signed)
[id number] Expected date:<BR> Expected time:<BR> Means of arrival:<BR> Comments:<BR> ems

## 2011-12-25 NOTE — ED Provider Notes (Signed)
History     CSN: [id number]  Arrival date & time 12/25/11  0129   First MD Initiated Contact with Patient 12/25/11 0147      Chief Complaint  Patient presents with  . Fever    (Consider location/radiation/quality/duration/timing/severity/associated sxs/prior treatment) HPI Level 5 Caveat: altered mental status. This is an 77 year old male with a two-day history of fever, generalized body aches and weakness. His symptoms acutely worsened this morning and he was too weak to get up off the ground and then to bed. He has been noted to have dark urine to be wheezing and to be confused this morning. He denies body pain at the present time. His appetite has been poor but there's been no nausea, vomiting or diarrhea. His fever was noted to be 102.8 on arrival. He was given Tylenol by mouth. His family states he has not gotten a flu shot this year.  Past Medical History  Diagnosis Date  . ACUTE RENAL FAILURE W/LESION OF TUBULAR NECROSIS 01/04/2007  . CAROTID ARTERY DISEASE 06/01/2009  . DEPRESSION 07/25/2006  . DIVERTICULOSIS OF COLON 02/01/2007  . GOUT 07/25/2006  . HEMORRHOID, THROMBOSED 02/15/2009  . HYPERCHOLESTEROLEMIA 07/25/2006  . HYPERTENSION 07/25/2006  . PROSTATE CANCER, HX OF 07/25/2006  . Rosacea 09/28/2008  . WEIGHT LOSS 09/01/2009  . Glaucoma(365)     Past Surgical History  Procedure Date  . Carotid endarterectomy   . Cataract extraction     History reviewed. No pertinent family history.  History  Substance Use Topics  . Smoking status: Former Smoker    Quit date: 01/17/1988  . Smokeless tobacco: Never Used  . Alcohol Use: Yes      Review of Systems  Unable to perform ROS   Allergies  Shellfish allergy  Home Medications   Current Outpatient Rx  Name  Route  Sig  Dispense  Refill  . AMLODIPINE BESYLATE 5 MG PO TABS   Oral   Take 5 mg by mouth daily.         . ASPIRIN EC 81 MG PO TBEC   Oral   Take 81 mg by mouth daily.         Marland Kitchen ESOMEPRAZOLE MAGNESIUM  40 MG PO CPDR   Oral   Take 40 mg by mouth daily before breakfast.         . EZETIMIBE-SIMVASTATIN 10-20 MG PO TABS   Oral   Take 1 tablet by mouth at bedtime.   90 tablet   3   . FOSINOPRIL SODIUM 20 MG PO TABS   Oral   Take 20 mg by mouth daily.         Marland Kitchen TAMSULOSIN HCL 0.4 MG PO CAPS   Oral   Take 0.4 mg by mouth daily.         Marland Kitchen TIMOLOL HEMIHYDRATE 0.5 % OP SOLN   Both Eyes   Place 1 drop into both eyes 2 (two) times daily.          . OMEGA-3 FATTY ACIDS 1000 MG PO CAPS   Oral   Take 1 g by mouth daily.           BP 108/52  Pulse 85  Temp 98.3 F (36.8 C) (Oral)  Resp 22  SpO2 94%  Physical Exam General: Well-developed, well-nourished male in no acute distress; appearance consistent with age of record HENT: normocephalic, atraumatic Eyes: Lens implants; irregular right pupil; extraocular muscles intact Neck: supple Heart: regular rate and rhythm; tachycardia; frequent ectopy Lungs: clear  to auscultation bilaterally Abdomen: soft; nondistended; nontender; bowel sounds present Rectal: External hemorrhoids; normal sphincter tone; brown stool in vault Extremities: No deformity; full range of motion; pulses normal; no edema Neurologic: Awake, alert and oriented to person, place and day but not year; motor function intact in all extremities and symmetric; no facial droop Skin: Warm and dry Psychiatric: Normal mood and affect    ED Course  Procedures (including critical care time)     MDM   Nursing notes and vitals signs, including pulse oximetry, reviewed.  Summary of this visit's results, reviewed by myself:  Labs:  Results for orders placed during the hospital encounter of 12/25/11 (from the past 24 hour(s))  CBC     Status: Abnormal   Collection Time   12/25/11  2:10 AM      Component Value Range   WBC 16.1 (*) 4.0 - 10.5 K/uL   RBC 1.71 (*) 4.22 - 5.81 MIL/uL   Hemoglobin 5.2 (*) 13.0 - 17.0 g/dL   HCT 14.6 (*) 39.0 - 52.0 %   MCV  85.4  78.0 - 100.0 fL   MCH 30.4  26.0 - 34.0 pg   MCHC 35.6  30.0 - 36.0 g/dL   RDW 14.5  11.5 - 15.5 %   Platelets 260  150 - 400 K/uL  COMPREHENSIVE METABOLIC PANEL     Status: Abnormal   Collection Time   12/25/11  2:10 AM      Component Value Range   Sodium 130 (*) 135 - 145 mEq/L   Potassium 3.7  3.5 - 5.1 mEq/L   Chloride 96  96 - 112 mEq/L   CO2 20  19 - 32 mEq/L   Glucose, Bld 139 (*) 70 - 99 mg/dL   BUN 26 (*) 6 - 23 mg/dL   Creatinine, Ser 2.01 (*) 0.50 - 1.35 mg/dL   Calcium 8.9  8.4 - 10.5 mg/dL   Total Protein 7.0  6.0 - 8.3 g/dL   Albumin 3.2 (*) 3.5 - 5.2 g/dL   AST 70 (*) 0 - 37 U/L   ALT 50  0 - 53 U/L   Alkaline Phosphatase 99  39 - 117 U/L   Total Bilirubin 1.9 (*) 0.3 - 1.2 mg/dL   GFR calc non Af Amer 29 (*) >90 mL/min   GFR calc Af Amer 34 (*) >90 mL/min  LACTIC ACID, PLASMA     Status: Normal   Collection Time   12/25/11  2:10 AM      Component Value Range   Lactic Acid, Venous 1.1  0.5 - 2.2 mmol/L  CK     Status: Abnormal   Collection Time   12/25/11  2:10 AM      Component Value Range   Total CK 272 (*) 7 - 232 U/L  URINALYSIS, ROUTINE W REFLEX MICROSCOPIC     Status: Abnormal   Collection Time   12/25/11  2:11 AM      Component Value Range   Color, Urine AMBER (*) YELLOW   APPearance CLEAR  CLEAR   Specific Gravity, Urine 1.021  1.005 - 1.030   pH 7.5  5.0 - 8.0   Glucose, UA NEGATIVE  NEGATIVE mg/dL   Hgb urine dipstick LARGE (*) NEGATIVE   Bilirubin Urine SMALL (*) NEGATIVE   Ketones, ur NEGATIVE  NEGATIVE mg/dL   Protein, ur 100 (*) NEGATIVE mg/dL   Urobilinogen, UA 1.0  0.0 - 1.0 mg/dL   Nitrite NEGATIVE  NEGATIVE   Leukocytes,  UA NEGATIVE  NEGATIVE  URINE MICROSCOPIC-ADD ON     Status: Normal   Collection Time   12/25/11  2:11 AM      Component Value Range   Squamous Epithelial / LPF RARE  RARE   WBC, UA 0-2  <3 WBC/hpf   RBC / HPF 0-2  <3 RBC/hpf   Bacteria, UA RARE  RARE    Imaging Studies: Dg Chest 2 View  12/25/2011   *RADIOLOGY REPORT*  Clinical Data: Fever and body aches.  CHEST - 2 VIEW  Comparison: 02/08/2007  Findings: Shallow inspiration. The heart size and pulmonary vascularity are normal. The lungs appear clear and expanded without focal air space disease or consolidation. No blunting of the costophrenic angles.  No pneumothorax.  Mediastinal contours are intact.  Bony ankylosis of the thoracic spine.  Vascular calcifications. Degenerative changes in the shoulders.  No significant change since previous study.  IMPRESSION: No evidence of active pulmonary disease.   Original Report Authenticated By: Lucienne Capers, M.D.       EKG Interpretation:  Date & Time: 12/25/2011 1:47 AM  Rate: 96  Rhythm: sinus tachycardia with PVCs  QRS Axis: normal  Intervals: normal  ST/T Wave abnormalities: normal  Conduction Disutrbances:none  Narrative Interpretation:   Old EKG Reviewed: No PVCs seen previously         Wynetta Fines, MD 12/25/11 [phone]

## 2011-12-25 NOTE — ED Notes (Signed)
As per EMS pt has generalized weakness with fever.Pt was found by family weak and confusion.pt has PIV and was tachy en route.Hx UTI and pt family sts pt urine was tea color

## 2011-12-25 NOTE — H&P (Signed)
Alexander Mcdonald is an 78 y.o. male.   Patient was seen and examined on December 25, 2011. PCP - Dr. Beverely Risen. Chief Complaint: Weakness. HPI: 78 year old male with history of hypertension, chronic kidney disease, gout, carotid artery disease and prostate cancer was brought to the ER patient was found to be feeling weak. As per patient's wife patient has been feeling weak for last 2 days and complaining of generalized body ache. Last evening he felt very weak to walk and he was brought to the ER. In the ER he was found to be febrile with temperatures around 102F. Chest x-ray and UA were unremarkable. EKG showed sinus rhythm with PVCs. Patient temperature improved with Tylenol. Patient otherwise denies any chest pain shortness of breath nausea vomiting or diarrhea. Patient's initial hemoglobin was on 5 and repeat was 11. At this time patient has been admitted for further management.  Past Medical History  Diagnosis Date  . ACUTE RENAL FAILURE W/LESION OF TUBULAR NECROSIS 01/04/2007  . CAROTID ARTERY DISEASE 06/01/2009  . DEPRESSION 07/25/2006  . DIVERTICULOSIS OF COLON 02/01/2007  . GOUT 07/25/2006  . HEMORRHOID, THROMBOSED 02/15/2009  . HYPERCHOLESTEROLEMIA 07/25/2006  . HYPERTENSION 07/25/2006  . PROSTATE CANCER, HX OF 07/25/2006  . Rosacea 09/28/2008  . WEIGHT LOSS 09/01/2009  . Glaucoma(365)     Past Surgical History  Procedure Date  . Carotid endarterectomy   . Cataract extraction     History reviewed. No pertinent family history. Social History:  reports that he quit smoking about 23 years ago. He has never used smokeless tobacco. He reports that he drinks alcohol. He reports that he does not use illicit drugs.  Allergies:  Allergies  Allergen Reactions  . Shellfish Allergy Anaphylaxis    Pt is allergic to mussels      (Not in a hospital admission)  Results for orders placed during the hospital encounter of 12/25/11 (from the past 48 hour(s))  CBC     Status: Abnormal   Collection  Time   12/25/11  2:10 AM      Component Value Range Comment   WBC 16.1 (*) 4.0 - 10.5 K/uL    RBC 1.71 (*) 4.22 - 5.81 MIL/uL    Hemoglobin 5.2 (*) 13.0 - 17.0 g/dL    HCT 14.6 (*) 39.0 - 52.0 %    MCV 85.4  78.0 - 100.0 fL    MCH 30.4  26.0 - 34.0 pg    MCHC 35.6  30.0 - 36.0 g/dL    RDW 14.5  11.5 - 15.5 %    Platelets 260  150 - 400 K/uL   COMPREHENSIVE METABOLIC PANEL     Status: Abnormal   Collection Time   12/25/11  2:10 AM      Component Value Range Comment   Sodium 130 (*) 135 - 145 mEq/L    Potassium 3.7  3.5 - 5.1 mEq/L    Chloride 96  96 - 112 mEq/L    CO2 20  19 - 32 mEq/L    Glucose, Bld 139 (*) 70 - 99 mg/dL    BUN 26 (*) 6 - 23 mg/dL    Creatinine, Ser 2.01 (*) 0.50 - 1.35 mg/dL    Calcium 8.9  8.4 - 10.5 mg/dL    Total Protein 7.0  6.0 - 8.3 g/dL    Albumin 3.2 (*) 3.5 - 5.2 g/dL    AST 70 (*) 0 - 37 U/L    ALT 50  0 - 53 U/L  Alkaline Phosphatase 99  39 - 117 U/L    Total Bilirubin 1.9 (*) 0.3 - 1.2 mg/dL    GFR calc non Af Amer 29 (*) >90 mL/min    GFR calc Af Amer 34 (*) >90 mL/min   LACTIC ACID, PLASMA     Status: Normal   Collection Time   12/25/11  2:10 AM      Component Value Range Comment   Lactic Acid, Venous 1.1  0.5 - 2.2 mmol/L   CK     Status: Abnormal   Collection Time   12/25/11  2:10 AM      Component Value Range Comment   Total CK 272 (*) 7 - 232 U/L   URINALYSIS, ROUTINE W REFLEX MICROSCOPIC     Status: Abnormal   Collection Time   12/25/11  2:11 AM      Component Value Range Comment   Color, Urine AMBER (*) YELLOW BIOCHEMICALS MAY BE AFFECTED BY COLOR   APPearance CLEAR  CLEAR    Specific Gravity, Urine 1.021  1.005 - 1.030    pH 7.5  5.0 - 8.0    Glucose, UA NEGATIVE  NEGATIVE mg/dL    Hgb urine dipstick LARGE (*) NEGATIVE    Bilirubin Urine SMALL (*) NEGATIVE    Ketones, ur NEGATIVE  NEGATIVE mg/dL    Protein, ur 100 (*) NEGATIVE mg/dL    Urobilinogen, UA 1.0  0.0 - 1.0 mg/dL    Nitrite NEGATIVE  NEGATIVE    Leukocytes, UA  NEGATIVE  NEGATIVE   URINE MICROSCOPIC-ADD ON     Status: Normal   Collection Time   12/25/11  2:11 AM      Component Value Range Comment   Squamous Epithelial / LPF RARE  RARE    WBC, UA 0-2  <3 WBC/hpf    RBC / HPF 0-2  <3 RBC/hpf    Bacteria, UA RARE  RARE   TYPE AND SCREEN     Status: Normal   Collection Time   12/25/11  2:48 AM      Component Value Range Comment   ABO/RH(D) A NEG      Antibody Screen NEG      Sample Expiration 12/28/2011     ABO/RH     Status: Normal   Collection Time   12/25/11  2:48 AM      Component Value Range Comment   ABO/RH(D) A NEG     CBC WITH DIFFERENTIAL     Status: Abnormal   Collection Time   12/25/11  5:20 AM      Component Value Range Comment   WBC 9.3  4.0 - 10.5 K/uL    RBC 3.69 (*) 4.22 - 5.81 MIL/uL    Hemoglobin 11.0 (*) 13.0 - 17.0 g/dL    HCT 31.1 (*) 39.0 - 52.0 %    MCV 84.3  78.0 - 100.0 fL    MCH 29.8  26.0 - 34.0 pg    MCHC 35.4  30.0 - 36.0 g/dL    RDW 14.6  11.5 - 15.5 %    Platelets 148 (*) 150 - 400 K/uL    Neutrophils Relative 78 (*) 43 - 77 %    Neutro Abs 7.3  1.7 - 7.7 K/uL    Lymphocytes Relative 11 (*) 12 - 46 %    Lymphs Abs 1.0  0.7 - 4.0 K/uL    Monocytes Relative 11  3 - 12 %    Monocytes Absolute 1.0  0.1 -  1.0 K/uL    Eosinophils Relative 0  0 - 5 %    Eosinophils Absolute 0.0  0.0 - 0.7 K/uL    Basophils Relative 0  0 - 1 %    Basophils Absolute 0.0  0.0 - 0.1 K/uL    Dg Chest 2 View  12/25/2011  *RADIOLOGY REPORT*  Clinical Data: Fever and body aches.  CHEST - 2 VIEW  Comparison: 02/08/2007  Findings: Shallow inspiration. The heart size and pulmonary vascularity are normal. The lungs appear clear and expanded without focal air space disease or consolidation. No blunting of the costophrenic angles.  No pneumothorax.  Mediastinal contours are intact.  Bony ankylosis of the thoracic spine.  Vascular calcifications. Degenerative changes in the shoulders.  No significant change since previous study.  IMPRESSION:  No evidence of active pulmonary disease.   Original Report Authenticated By: Lucienne Capers, M.D.     Review of Systems  Constitutional: Positive for malaise/fatigue.  HENT: Negative.   Eyes: Negative.   Respiratory: Negative.   Cardiovascular: Negative.   Gastrointestinal: Negative.   Genitourinary: Negative.   Musculoskeletal: Negative.   Skin: Negative.   Neurological: Positive for weakness.  Endo/Heme/Allergies: Negative.   Psychiatric/Behavioral: Negative.     Blood pressure 132/49, pulse 72, temperature 98.3 F (36.8 C), temperature source Oral, resp. rate 18, SpO2 94.00%. Physical Exam  Constitutional: He is oriented to person, place, and time. He appears well-developed and well-nourished. No distress.  HENT:  Head: Normocephalic and atraumatic.  Right Ear: External ear normal.  Left Ear: External ear normal.  Nose: Nose normal.  Mouth/Throat: Oropharynx is clear and moist. No oropharyngeal exudate.  Eyes: Conjunctivae normal are normal. Pupils are equal, round, and reactive to light. Right eye exhibits no discharge. Left eye exhibits no discharge. No scleral icterus.  Neck: Normal range of motion. Neck supple.  Cardiovascular: Normal rate and regular rhythm.   Respiratory: Effort normal and breath sounds normal. No respiratory distress. He has no wheezes. He has no rales.  GI: Soft. Bowel sounds are normal. He exhibits no distension. There is no tenderness. There is no rebound.  Musculoskeletal: He exhibits no edema and no tenderness.  Neurological: He is alert and oriented to person, place, and time.       Moves all extremities.  Skin: Skin is warm and dry. He is not diaphoretic.     Assessment/Plan #1. Weakness, probably from dehydration and fever - patient has been started IV fluids and we will continue with gentle hydration with close followup of metabolic panel and intake output. Check orthostatics after hydration. Check troponin. #2. Fever - patient's chest  x-ray and UA are unremarkable. Fever improved with Tylenol. Blood cultures have been sent. We'll check influenza panel. #3. Acute renal failure on chronic kidney disease -  Patient's creatinine has mildly elevated from his baseline of around 1.6-1.8. Gently hydrate. Recheck metabolic panel. If creatinine worsens then we need to get renal sonogram and hold lisinopril. #4. Hypertension - continue present medications. See #3. #5. Anemia - recheck CBC. Stool for occult blood was negative as per ER physician. If there is no significant drop in hemoglobin then further workup as outpatient. #6. History of prostate cancer - further workup as outpatient. #7. Hyperlipidemia - patient's CK level is slightly high. Recheck CK levels and if it remains high then may have to discontinue statins.  CODE STATUS - full code.  Frantz Quattrone N. 12/25/2011, 5:50 AM

## 2011-12-25 NOTE — Progress Notes (Addendum)
Followup note:  Patient seen several hours after arriving on the floor. Daughter at bedside. Patient himself states he's feeling a little bit better, still very weak. Documented febrile during this admission.  White blood cell count normalized. Hemoglobin stable.  Cardiovascular: Regular rate and rhythm, S1-S2, soft Lungs: Very mild end expiratory wheezing  Assessment and plan: Febrile illness. Flu PCR is negative. We'll discontinue isolation. Start ambulating and treat symptomatically. Next the upper respiratory infection-suspect bronchitis. Monitor for further fevers. In the meantime, will treat with nebulizers. PT to see. Home possibly tomorrow with home health. Renal function improved with IV fluids. Also has clinical evidence for rhabdomyolysis-continue IV fluids. Recheck in the morning. We'll check with his primary care physician for baseline creatinine.

## 2011-12-26 ENCOUNTER — Inpatient Hospital Stay (HOSPITAL_COMMUNITY): Payer: Medicare Other

## 2011-12-26 DIAGNOSIS — N170 Acute kidney failure with tubular necrosis: Secondary | ICD-10-CM | POA: Diagnosis not present

## 2011-12-26 DIAGNOSIS — M6282 Rhabdomyolysis: Secondary | ICD-10-CM | POA: Diagnosis not present

## 2011-12-26 DIAGNOSIS — R0602 Shortness of breath: Secondary | ICD-10-CM | POA: Diagnosis not present

## 2011-12-26 DIAGNOSIS — I10 Essential (primary) hypertension: Secondary | ICD-10-CM | POA: Diagnosis not present

## 2011-12-26 DIAGNOSIS — R6510 Systemic inflammatory response syndrome (SIRS) of non-infectious origin without acute organ dysfunction: Secondary | ICD-10-CM | POA: Diagnosis present

## 2011-12-26 LAB — GLUCOSE, CAPILLARY: Glucose-Capillary: 140 mg/dL — ABNORMAL HIGH (ref 70–99)

## 2011-12-26 LAB — BASIC METABOLIC PANEL
BUN: 18 mg/dL (ref 6–23)
CO2: 21 mEq/L (ref 19–32)
Calcium: 8.6 mg/dL (ref 8.4–10.5)
Chloride: 97 mEq/L (ref 96–112)
Creatinine, Ser: 1.45 mg/dL — ABNORMAL HIGH (ref 0.50–1.35)
GFR calc Af Amer: 50 mL/min — ABNORMAL LOW (ref >90)
GFR calc non Af Amer: 43 mL/min — ABNORMAL LOW (ref >90)
Glucose, Bld: 111 mg/dL — ABNORMAL HIGH (ref 70–99)
Potassium: 3.5 mEq/L (ref 3.5–5.1)
Sodium: 131 mEq/L — ABNORMAL LOW (ref 135–145)

## 2011-12-26 LAB — CBC
HCT: 33.2 % — ABNORMAL LOW (ref 39.0–52.0)
Hemoglobin: 11.5 g/dL — ABNORMAL LOW (ref 13.0–17.0)
MCH: 29.5 pg (ref 26.0–34.0)
MCHC: 34.6 g/dL (ref 30.0–36.0)
MCV: 85.1 fL (ref 78.0–100.0)
Platelets: 163 10*3/uL (ref 150–400)
RBC: 3.9 MIL/uL — ABNORMAL LOW (ref 4.22–5.81)
RDW: 14.9 % (ref 11.5–15.5)
WBC: 8.7 10*3/uL (ref 4.0–10.5)

## 2011-12-26 LAB — CK: Total CK: 561 U/L — ABNORMAL HIGH (ref 7–232)

## 2011-12-26 MED ORDER — VANCOMYCIN HCL IN DEXTROSE 1-5 GM/200ML-% IV SOLN
1000.0000 mg | INTRAVENOUS | Status: DC
  Administered 2011-12-26 – 2011-12-28 (×3): 1000 mg via INTRAVENOUS
  Filled 2011-12-26 (×3): qty 200

## 2011-12-26 NOTE — Progress Notes (Signed)
ANTIBIOTIC CONSULT NOTE - INITIAL  Pharmacy Consult for Vancomycin Indication: GPC in blood culture(s)  Allergies  Allergen Reactions  . Shellfish Allergy Anaphylaxis    Pt is allergic to mussels    Patient Measurements: Height: 5' 5.5" (166.4 cm) Weight: 157 lb 6.5 oz (71.4 kg) IBW/kg (Calculated) : 62.65   Vital Signs: Temp: 100.9 F (38.3 C) (12/10 0641) Temp src: Oral (12/10 0641) BP: 172/68 mmHg (12/10 0641) Pulse Rate: 110  (12/10 0641) Intake/Output from previous day: 12/09 0701 - 12/10 0700 In: 1030 [P.O.:480; I.V.:550] Out: 1425 [Urine:1425] Intake/Output from this shift:    Labs:  Basename 12/26/11 0519 12/25/11 0900 12/25/11 0520 12/25/11 0210  WBC 8.7 8.9 9.3 --  HGB 11.5* 11.7* 11.0* --  PLT 163 148* 148* --  LABCREA -- -- -- --  CREATININE 1.45* 1.72* -- 2.01*   Estimated Creatinine Clearance: 34.2 ml/min (by C-G formula based on Cr of 1.45). No results found for this basename: VANCOTROUGH:2,VANCOPEAK:2,VANCORANDOM:2,GENTTROUGH:2,GENTPEAK:2,GENTRANDOM:2,TOBRATROUGH:2,TOBRAPEAK:2,TOBRARND:2,AMIKACINPEAK:2,AMIKACINTROU:2,AMIKACIN:2, in the last 72 hours   Microbiology: No results found for this or any previous visit (from the past 720 hour(s)).  Medical History: Past Medical History  Diagnosis Date  . ACUTE RENAL FAILURE W/LESION OF TUBULAR NECROSIS 01/04/2007  . CAROTID ARTERY DISEASE 06/01/2009  . DEPRESSION 07/25/2006  . DIVERTICULOSIS OF COLON 02/01/2007  . GOUT 07/25/2006  . HEMORRHOID, THROMBOSED 02/15/2009  . HYPERCHOLESTEROLEMIA 07/25/2006  . HYPERTENSION 07/25/2006  . PROSTATE CANCER, HX OF 07/25/2006  . Rosacea 09/28/2008  . WEIGHT LOSS 09/01/2009  . Glaucoma(365)     Medications:  Anti-infectives     Start     Dose/Rate Route Frequency Ordered Stop   12/26/11 1100   vancomycin (VANCOCIN) IVPB 1000 mg/200 mL premix        1,000 mg 200 mL/hr over 60 Minutes Intravenous Every 24 hours 12/26/11 0954     12/25/11 0330   oseltamivir (TAMIFLU)  capsule 75 mg        75 mg Oral  Once 12/25/11 0326 12/25/11 0358         Assessment: 85 yom admitted with febrile illness, no leukocytosis, bronchitis suspected. Received one dose Tamiflu.  GPC reported to MD in blood culture(s) from 12/9  Repeat blood cultures today and begin Vancomycin IV, plan 2D echo.  Renal function improved from admission, clearance about 40 ml/min (normalized)  Goal of Therapy:  Vancomycin trough level 15-20 mcg/ml  Plan:  Vancomycin 1000 mg IV q24hr Follow cultures Monitor renal function  Graylin Shiver Drummond Pager [phone] 12/26/2011,9:56 AM

## 2011-12-26 NOTE — Progress Notes (Signed)
[facility] PROGRESS NOTE  KYLIL Mcdonald [id number] DOB: 03-12-1926 DOA: 12/25/2011 PCP: Alexander Cowden, MD  Assessment/Plan: Principal Problem:  *SIRS (systemic inflammatory response syndrome): Notified this morning her blood cultures with gram-positive cocci in clusters. Patient continued to spike temperatures so likely this is real. Have ordered repeat blood cultures, IV vancomycin and 2-D echo. Difficult to say were source is coming from. Patient has no obvious cellulitis, skin breaks or open wounds. He was having upper respiratory symptoms yesterday as is possible this could be a staph pneumonia. The meantime will order 2-D echo to rule out endocarditis. Infectious disease to see. Active Problems:  Weakness: Secondary to above  Fever: Monitor for fever after vancomycin started  Renal failure (ARF), acute on chronic: Improved with IV fluids. Some component of this is secondary to mild rhabdo.  HTN (hypertension): Blood pressure stable. No sepsis signs of hypotension  Rhabdomyolysis: Mild. More likely from decreased immobilization after illness as outpatient. Has stopped statin medication in the interim.   Code Status: Full code Family Communication: Discussed plan with daughter today Disposition Plan: Likely in the hospital for a few more days.? Home with home health   Consultants: Have consulted infectious disease today  Procedures:  2-D echo pending  Antibiotics:  IV vancomycin started 12/10: Day one  HPI/Subjective: Patient himself feels very weak. He does not really voice any complaints. Denies any shortness of breath, chest pain  Objective: Filed Vitals:   12/25/11 2212 12/26/11 0641 12/26/11 1015 12/26/11 1228  BP:  172/68 147/64   Pulse:  110    Temp:  100.9 F (38.3 C) 102.8 F (39.3 C) 100.2 F (37.9 C)  TempSrc:  Oral Axillary Axillary  Resp:  20    Height:      Weight:  71.4 kg (157 lb 6.5 oz)    SpO2: 92% 93%      Intake/Output  Summary (Last 24 hours) at 12/26/11 1314 Last data filed at 12/26/11 1003  Gross per 24 hour  Intake    670 ml  Output    975 ml  Net   -305 ml   Filed Weights   12/25/11 1000 12/26/11 0641  Weight: 73.029 kg (161 lb) 71.4 kg (157 lb 6.5 oz)    Exam:   General:  Alert and oriented x2, quite fatigued, looks about stated age  Cardiovascular: Regular rhythm, borderline tachycardia  Respiratory: Clear to auscultation bilaterally, improved from yesterday which had end expiratory wheezing  Abdomen: Soft, nontender, nondistended, positive bowel sounds  Extremities: Trace edema  No evidence of any skin breaks, tears or lesions  Data Reviewed: Basic Metabolic Panel:  Lab 0000000 0519 12/25/11 0900 12/25/11 0210  NA 131* 134* 130*  K 3.5 3.8 3.7  CL 97 102 96  CO2 21 22 20   GLUCOSE 111* 108* 139*  BUN 18 22 26*  CREATININE 1.45* 1.72* 2.01*  CALCIUM 8.6 8.4 8.9  MG -- -- --  PHOS -- -- --   Liver Function Tests:  Lab 12/25/11 0210  AST 70*  ALT 50  ALKPHOS 99  BILITOT 1.9*  PROT 7.0  ALBUMIN 3.2*   CBC:  Lab 12/26/11 0519 12/25/11 0900 12/25/11 0520 12/25/11 0210  WBC 8.7 8.9 9.3 16.1*  NEUTROABS -- 6.8 7.3 --  HGB 11.5* 11.7* 11.0* 5.2*  HCT 33.2* 33.8* 31.1* 14.6*  MCV 85.1 85.6 84.3 85.4  PLT 163 148* 148* 260   Cardiac Enzymes:  Lab 12/26/11 0519 12/25/11 0900 12/25/11 0520 12/25/11 0210  CKTOTAL  561* 1043* -- 272*  CKMB -- -- -- --  CKMBINDEX -- -- -- --  TROPONINI -- <0.30 <0.30 --   CBG:  Lab 12/25/11 0148  GLUCAP 140*      Studies: Dg Chest 2 View  12/25/2011  IMPRESSION: No evidence of active pulmonary disease.   Original Report Authenticated By: Alexander Mcdonald, M.D.     Scheduled Meds:   . amLODipine  5 mg Oral Daily  . lisinopril  20 mg Oral Daily  . omega-3 acid ethyl esters  1 g Oral Daily  . pantoprazole  40 mg Oral Daily  . sodium chloride  3 mL Intravenous Q12H  . Tamsulosin HCl  0.4 mg Oral Daily  . timolol  1 drop  Both Eyes BID  . vancomycin  1,000 mg Intravenous Q24H  . [DISCONTINUED] ezetimibe-simvastatin  1 tablet Oral QHS   Continuous Infusions:   . [EXPIRED] sodium chloride 50 mL/hr at 12/25/11 1117    Principal Problem:  *SIRS (systemic inflammatory response syndrome) Active Problems:  Weakness  Fever  Renal failure (ARF), acute on chronic  HTN (hypertension)  Rhabdomyolysis    Time spent: 35 minutes    [facility] Pager [phone]. If 8PM-8AM, please contact night-coverage at [email or web address], password [facility] 12/26/2011, 1:14 PM  LOS: 1 day

## 2011-12-26 NOTE — Progress Notes (Signed)
Responded to rapid response called on pt.    No family present at time.  Will follow up with pt and family as needed.   Creta Levin  MDiv, Chaplain

## 2011-12-26 NOTE — Progress Notes (Signed)
Lab called gram + bacteria in clusters in 12/9 anaerobic blood culture. Paged result to Dr. [place] Pink.  Remer Macho RN

## 2011-12-26 NOTE — Evaluation (Signed)
Physical Therapy Evaluation Patient Details Name: Alexander Mcdonald MRN: [id number] DOB: 1926/12/04 Today's Date: 12/26/2011 Time: 1205-1226 PT Time Calculation (min): 21 min  PT Assessment / Plan / Recommendation Clinical Impression  Pt. was admitted 10/09 for progressive weakness, fever. urine bacteremia. Pt.presented with sluggish responses and movement. Pt. felt very warm and sheets were damp . Per daughter, pt. was independent prior to illness, cared for all aspects of home such as cooking, driving, cleaning. Pt's wife is disabled. Daughter states if pt. can care for himself, family can help out. Pt. will benefit from PT while in acute care. Will have to see how pt progresses to determine follow up PT needs. Recommend OT consult here on acute.    PT Assessment  Patient needs continued PT services    Follow Up Recommendations  Home health PT;SNF (will see progress )    Does the patient have the potential to tolerate intense rehabilitation      Barriers to Discharge Decreased caregiver support      Equipment Recommendations  None recommended by PT    Recommendations for Other Services OT consult   Frequency Min 3X/week    Precautions / Restrictions Precautions Precautions: Fall   Pertinent Vitals/Pain Pt was mildly SOB. sats were 99% on RA after getting to recliner.     Mobility  Bed Mobility Bed Mobility: Supine to Sit Supine to Sit: 3: Mod assist Details for Bed Mobility Assistance: requent tactile cues to keep on track for activity. Transfers Transfers: Sit to Stand;Stand to Sit Sit to Stand: 4: Min assist;From bed Stand to Sit: To chair/3-in-1;4: Min guard Details for Transfer Assistance: Cues to initiate standing from bed, cues for sitting to recliner Ambulation/Gait Ambulation/Gait Assistance: 3: Mod assist Ambulation Distance (Feet): 20 Feet Assistive device: Rolling walker Ambulation/Gait Assistance Details: cues for initiating ambulation. cues to stay inside  RW, for turns. Gait Pattern: Decreased stride length;Step-through pattern Gait velocity: decreased speed,    Shoulder Instructions     Exercises     PT Diagnosis: Difficulty walking;Generalized weakness  PT Problem List: Decreased strength;Decreased activity tolerance;Decreased balance;Decreased mobility;Decreased safety awareness;Decreased cognition;Decreased knowledge of precautions PT Treatment Interventions: DME instruction;Gait training;Functional mobility training;Therapeutic activities;Patient/family education;Cognitive remediation   PT Goals Acute Rehab PT Goals PT Goal Formulation: With patient/family Time For Goal Achievement: 01/09/12 Potential to Achieve Goals: Good Pt will go Supine/Side to Sit: Independently PT Goal: Supine/Side to Sit - Progress: Goal set today Pt will go Sit to Supine/Side: Independently PT Goal: Sit to Supine/Side - Progress: Goal set today Pt will go Sit to Stand: Independently PT Goal: Sit to Stand - Progress: Goal set today Pt will go Stand to Sit: Independently PT Goal: Stand to Sit - Progress: Goal set today Pt will Ambulate: >150 feet  Visit Information  Last PT Received On: 12/26/11 Assistance Needed: +1    Subjective Data  Subjective: where are we going. Patient Stated Goal: pt, unable. Pt's daughter came in and stated hopefully for home.   Prior Functioning  Home Living Lives With: Spouse Available Help at Discharge: Family Type of Home: House Home Access: Stairs to enter [employer] of Steps: 2 Home Layout: Two level;Able to live on main level with bedroom/bathroom Bathroom Shower/Tub: Chiropodist: Standard Home Adaptive Equipment: None Additional Comments: pt was primary caregiver, cooking, etc. Prior Function Level of Independence: Independent Able to Take Stairs?: Yes Driving: Yes Vocation: Retired    Associate Professor  Overall Cognitive Status: Difficult to assess Area of Impairment:  Attention;Following commands;Problem solving Arousal/Alertness: Lethargic Orientation Level: Disoriented to;Situation Behavior During Session: Flat affect Following Commands: Follows one step commands inconsistently Cognition - Other Comments: sluggish responses , required questions asked more than once.    Extremity/Trunk Assessment Right Upper Extremity Assessment RUE ROM/Strength/Tone: [facility] for tasks assessed Left Upper Extremity Assessment LUE ROM/Strength/Tone: WFL for tasks assessed Right Lower Extremity Assessment RLE ROM/Strength/Tone: [facility] for tasks assessed Left Lower Extremity Assessment LLE ROM/Strength/Tone: WFL for tasks assessed   Balance Static Standing Balance Static Standing - Comment/# of Minutes: requires steadyn assistance for standing with no UE support.  End of Session PT - End of Session Activity Tolerance: Patient limited by fatigue Patient left: in chair;with call bell/phone within reach;with chair alarm set;with family/visitor present Nurse Communication: Mobility status  GP     Claretha Cooper 12/26/2011, 2:26 PM  [phone]

## 2011-12-26 NOTE — Progress Notes (Signed)
Notified that patient's O2 sats were 56. Placed mask on patient and called rapid response as patient also had decreased level of consciousness. Placed on non-rebreather mask and patients sats came up to 100%. Patient became more awake and was transferred back to the bed. Dr. Pricilla Handler notified and assessed patient. Will continue to monitor patient. Remer Macho RN

## 2011-12-27 DIAGNOSIS — I10 Essential (primary) hypertension: Secondary | ICD-10-CM | POA: Diagnosis not present

## 2011-12-27 DIAGNOSIS — J189 Pneumonia, unspecified organism: Secondary | ICD-10-CM | POA: Diagnosis present

## 2011-12-27 DIAGNOSIS — B957 Other staphylococcus as the cause of diseases classified elsewhere: Secondary | ICD-10-CM

## 2011-12-27 DIAGNOSIS — R5381 Other malaise: Secondary | ICD-10-CM

## 2011-12-27 DIAGNOSIS — R5383 Other fatigue: Secondary | ICD-10-CM

## 2011-12-27 DIAGNOSIS — N189 Chronic kidney disease, unspecified: Secondary | ICD-10-CM

## 2011-12-27 DIAGNOSIS — F329 Major depressive disorder, single episode, unspecified: Secondary | ICD-10-CM | POA: Diagnosis not present

## 2011-12-27 DIAGNOSIS — R7881 Bacteremia: Secondary | ICD-10-CM

## 2011-12-27 LAB — EXPECTORATED SPUTUM ASSESSMENT W REFEX TO RESP CULTURE

## 2011-12-27 LAB — EXPECTORATED SPUTUM ASSESSMENT W GRAM STAIN, RFLX TO RESP C

## 2011-12-27 LAB — STREP PNEUMONIAE URINARY ANTIGEN: Strep Pneumo Urinary Antigen: NEGATIVE

## 2011-12-27 MED ORDER — PIPERACILLIN-TAZOBACTAM 3.375 G IVPB
3.3750 g | Freq: Three times a day (TID) | INTRAVENOUS | Status: DC
  Administered 2011-12-27 – 2012-01-01 (×16): 3.375 g via INTRAVENOUS
  Filled 2011-12-27 (×17): qty 50

## 2011-12-27 MED ORDER — POLYETHYLENE GLYCOL 3350 17 G PO PACK
17.0000 g | PACK | Freq: Every day | ORAL | Status: DC
  Administered 2011-12-27 – 2011-12-31 (×4): 17 g via ORAL
  Filled 2011-12-27 (×7): qty 1

## 2011-12-27 MED ORDER — SENNA 8.6 MG PO TABS
2.0000 | ORAL_TABLET | Freq: Every evening | ORAL | Status: DC | PRN
  Administered 2011-12-27: 17.2 mg via ORAL
  Filled 2011-12-27 (×2): qty 1

## 2011-12-27 MED ORDER — LEVALBUTEROL HCL 0.63 MG/3ML IN NEBU
0.6300 mg | INHALATION_SOLUTION | RESPIRATORY_TRACT | Status: DC | PRN
  Administered 2011-12-27 – 2011-12-28 (×2): 0.63 mg via RESPIRATORY_TRACT
  Filled 2011-12-27 (×2): qty 3

## 2011-12-27 MED ORDER — DOCUSATE SODIUM 100 MG PO CAPS
100.0000 mg | ORAL_CAPSULE | Freq: Two times a day (BID) | ORAL | Status: DC
  Administered 2011-12-27 – 2011-12-31 (×9): 100 mg via ORAL
  Filled 2011-12-27 (×15): qty 1

## 2011-12-27 MED ORDER — LEVALBUTEROL HCL 0.63 MG/3ML IN NEBU
0.6300 mg | INHALATION_SOLUTION | Freq: Three times a day (TID) | RESPIRATORY_TRACT | Status: DC
  Filled 2011-12-27: qty 3

## 2011-12-27 NOTE — Progress Notes (Addendum)
[facility] PROGRESS NOTE  Alexander Mcdonald [id number] DOB: 08-26-1926 DOA: 12/25/2011 PCP: [name], MD  Assessment/Plan:  Community acquired pneumonia versus aspiration pneumonia.  Thick green sputum, frequent desaturations -  Add zosyn pending infectious disease consultation -  Strep pneumo and legionella ag -  Sputum culture -  Already on vancomycin for coag neg staph + blood culture  Coag neg staph bacteremia, may be contaminant in the setting of pneumonia.  -  ID consult pending -  Notified lab to please run sensitivities on the culture -  Continue vancomycin -  Daily blood cultures -  TTE pending -  Unsure if he would tolerate TEE given his current frequent desaturations   Weakness: Secondary to above  Fever:  Trending down after initiation of vancomycin   HTN (hypertension): Blood pressure stable to elevated. No sepsis signs of hypotension  Rhabdomyolysis very mild:  Hold statin for now but should be able to resume as outpatient. Hyponatremia:   Acute on chronic kidney disease:  May have been prerenal from acute illness. CPK was not very elevated so rhabdo induced AKI less likely.  Patient eating somewhat better today and appears hydrated.  DIET:  Healthy heart ACCESS:  PIV IVF:  None PROPH:  SCDs  Code Status: Full code Family Communication: Discussed plan with daughter today Disposition Plan:  Pending further evaluation for endocarditis to determine antibiotic course.    Consultants: -  Infectious disease   Procedures:  2-D echo pending  Antibiotics:  IV vancomycin started 12/10:  Day 2  HPI/Subjective: Patient himself feels very weak.  Has some shortness of breath, chest pain.  Denies nausea, vomiting.  Voiding all right.  Had BM yesterday, but per nurse he is "bound up"  Objective: Filed Vitals:   12/26/11 1838 12/26/11 2129 12/27/11 0427 12/27/11 1303  BP:  122/47 154/82 163/74  Pulse:  82 95 100  Temp:  99.8 F (37.7 C)  98.8 F (37.1 C) 98.4 F (36.9 C)  TempSrc:  Axillary Oral Oral  Resp:  20 20 20   Height:      Weight:   71.8 kg (158 lb 4.6 oz)   SpO2: 96% 97% 96% 97%    Intake/Output Summary (Last 24 hours) at 12/27/11 1313 Last data filed at 12/27/11 1300  Gross per 24 hour  Intake    680 ml  Output    650 ml  Net     30 ml   Filed Weights   12/25/11 1000 12/26/11 0641 12/27/11 0427  Weight: 73.029 kg (161 lb) 71.4 kg (157 lb 6.5 oz) 71.8 kg (158 lb 4.6 oz)    Exam:   General:  CM, mild perioral cyanosis and tachypnea  Cardiovascular: Regular rhythm, borderline tachycardia  Respiratory:  Rhonchorous with bilateral diminished breath sounds and rare rales  Abdomen:  Soft, nontender, nondistended, positive bowel sounds  Extremities: Trace edema  Data Reviewed: Basic Metabolic Panel:  Lab 0000000 0519 12/25/11 0900 12/25/11 0210  NA 131* 134* 130*  K 3.5 3.8 3.7  CL 97 102 96  CO2 21 22 20   GLUCOSE 111* 108* 139*  BUN 18 22 26*  CREATININE 1.45* 1.72* 2.01*  CALCIUM 8.6 8.4 8.9  MG -- -- --  PHOS -- -- --   Liver Function Tests:  Lab 12/25/11 0210  AST 70*  ALT 50  ALKPHOS 99  BILITOT 1.9*  PROT 7.0  ALBUMIN 3.2*   CBC:  Lab 12/26/11 0519 12/25/11 0900 12/25/11 0520 12/25/11 0210  WBC  8.7 8.9 9.3 16.1*  NEUTROABS -- 6.8 7.3 --  HGB 11.5* 11.7* 11.0* 5.2*  HCT 33.2* 33.8* 31.1* 14.6*  MCV 85.1 85.6 84.3 85.4  PLT 163 148* 148* 260   Cardiac Enzymes:  Lab 12/26/11 0519 12/25/11 0900 12/25/11 0520 12/25/11 0210  CKTOTAL 561* 1043* -- 272*  CKMB -- -- -- --  CKMBINDEX -- -- -- --  TROPONINI -- <0.30 <0.30 --   CBG:  Lab 12/25/11 0148  GLUCAP 140*      Studies: Dg Chest 2 View  12/25/2011  IMPRESSION: No evidence of active pulmonary disease.   Original Report Authenticated By: Lucienne Capers, M.D.     Scheduled Meds:    . amLODipine  5 mg Oral Daily  . lisinopril  20 mg Oral Daily  . omega-3 acid ethyl esters  1 g Oral Daily  .  pantoprazole  40 mg Oral Daily  . sodium chloride  3 mL Intravenous Q12H  . Tamsulosin HCl  0.4 mg Oral Daily  . timolol  1 drop Both Eyes BID  . vancomycin  1,000 mg Intravenous Q24H   Continuous Infusions:   Principal Problem:  *SIRS (systemic inflammatory response syndrome) Active Problems:  Weakness  Fever  Renal failure (ARF), acute on chronic  HTN (hypertension)  Rhabdomyolysis    Time spent: 35 minutes    Paisly Fingerhut, [place] [facility] Pager [phone]. If 8PM-8AM, please contact night-coverage at [email or web address], password [facility] 12/27/2011, 1:13 PM  LOS: 2 days

## 2011-12-27 NOTE — Progress Notes (Signed)
ANTIBIOTIC CONSULT NOTE - INITIAL  Pharmacy Consult for Vancomycin, Zosyn Indication: bacteremia  Allergies  Allergen Reactions  . Shellfish Allergy Anaphylaxis    Pt is allergic to mussels     Patient Measurements: Height: 5' 5.5" (166.4 cm) Weight: 158 lb 4.6 oz (71.8 kg) IBW/kg (Calculated) : 62.65   Vital Signs: Temp: 98.4 F (36.9 C) (12/11 1303) Temp src: Oral (12/11 1303) BP: 163/74 mmHg (12/11 1303) Pulse Rate: 100  (12/11 1303) Intake/Output from previous day: 12/10 0701 - 12/11 0700 In: 440 [P.O.:240; IV Piggyback:200] Out: 450 [Urine:450]  Labs:  [facility] 12/26/11 0519 12/25/11 0900 12/25/11 0520 12/25/11 0210  WBC 8.7 8.9 9.3 --  HGB 11.5* 11.7* 11.0* --  PLT 163 148* 148* --  LABCREA -- -- -- --  CREATININE 1.45* 1.72* -- 2.01*   Estimated Creatinine Clearance: 34.2 ml/min (by C-G formula based on Cr of 1.45). No results found for this basename: VANCOTROUGH:2,VANCOPEAK:2,VANCORANDOM:2,GENTTROUGH:2,GENTPEAK:2,GENTRANDOM:2,TOBRATROUGH:2,TOBRAPEAK:2,TOBRARND:2,AMIKACINPEAK:2,AMIKACINTROU:2,AMIKACIN:2, in the last 72 hours   Microbiology: Recent Results (from the past 720 hour(s))  CULTURE, BLOOD (ROUTINE X 2)     Status: Normal (Preliminary result)   Collection Time   12/25/11  2:10 AM      Component Value Range Status Comment   Specimen Description BLOOD BLOOD RIGHT FOREARM   Final    Special Requests BOTTLES DRAWN AEROBIC AND ANAEROBIC 2 CC EACH   Final    Culture  Setup Time 12/25/2011 10:04   Final    Culture     Final    Value:        BLOOD CULTURE RECEIVED NO GROWTH TO DATE CULTURE WILL BE HELD FOR 5 DAYS BEFORE ISSUING A FINAL NEGATIVE REPORT   Report Status PENDING   Incomplete   CULTURE, BLOOD (ROUTINE X 2)     Status: Normal   Collection Time   12/25/11  2:10 AM      Component Value Range Status Comment   Specimen Description BLOOD RIGHT ANTECUBITAL   Final    Special Requests BOTTLES DRAWN AEROBIC AND ANAEROBIC 5 CC EACH   Final    Culture   Setup Time 12/25/2011 10:04   Final    Culture     Final    Value: STAPHYLOCOCCUS SPECIES (COAGULASE NEGATIVE)     Note: THE SIGNIFICANCE OF ISOLATING THIS ORGANISM FROM A SINGLE SET OF BLOOD CULTURES WHEN MULTIPLE SETS ARE DRAWN IS UNCERTAIN. PLEASE NOTIFY THE MICROBIOLOGY DEPARTMENT WITHIN ONE WEEK IF SPECIATION AND SENSITIVITIES ARE REQUIRED.     Note: Gram Stain Report Called to,Read Back By and Verified With: AMANDA THOMPSON 12/26/11 0830 BY SMITHERSJ   Report Status 12/27/2011 FINAL   Final   CULTURE, BLOOD (ROUTINE X 2)     Status: Normal (Preliminary result)   Collection Time   12/26/11  9:50 AM      Component Value Range Status Comment   Specimen Description BLOOD RIGHT ARM   Final    Special Requests BOTTLES DRAWN AEROBIC AND ANAEROBIC 5CC   Final    Culture  Setup Time 12/26/2011 20:54   Final    Culture     Final    Value:        BLOOD CULTURE RECEIVED NO GROWTH TO DATE CULTURE WILL BE HELD FOR 5 DAYS BEFORE ISSUING A FINAL NEGATIVE REPORT   Report Status PENDING   Incomplete   CULTURE, BLOOD (ROUTINE X 2)     Status: Normal (Preliminary result)   Collection Time   12/26/11  9:55 AM  Component Value Range Status Comment   Specimen Description BLOOD RIGHT HAND   Final    Special Requests BOTTLES DRAWN AEROBIC ONLY 5CC   Final    Culture  Setup Time 12/26/2011 20:54   Final    Culture     Final    Value:        BLOOD CULTURE RECEIVED NO GROWTH TO DATE CULTURE WILL BE HELD FOR 5 DAYS BEFORE ISSUING A FINAL NEGATIVE REPORT   Report Status PENDING   Incomplete     Medications:  Anti-infectives     Start     Dose/Rate Route Frequency Ordered Stop   12/26/11 1100   vancomycin (VANCOCIN) IVPB 1000 mg/200 mL premix        1,000 mg 200 mL/hr over 60 Minutes Intravenous Every 24 hours 12/26/11 0954     12/25/11 0330   oseltamivir (TAMIFLU) capsule 75 mg        75 mg Oral  Once 12/25/11 0326 12/25/11 0358         Assessment:  85 YOM with GPC in 1/2 blood cultures on  12/10.   Day #2 Vancomycin, pharmacy requested to add Zosyn.  Bld Cxt results today are Coag Negative Staph in 1/2 cultures, repeat cxt are in process.  Renal function is improved, CrCl (n) ~ 39  Goal of Therapy:  Vancomycin trough level 15-20 mcg/ml Appropriate abx dosing, eradication of infection.  Plan:   Zosyn 3.375g IV Q8H infused over 4hrs.  Continue Vancomycin 1g IV q24h.  Measure Vanc trough at steady state.  Follow up renal fxn and culture results.   Gretta Arab PharmD, BCPS Pager [phone] 12/27/2011 2:17 PM

## 2011-12-27 NOTE — Consult Note (Signed)
[facility]          Day 2 vancomycin        Day 1 piperacillin tazobactam       Reason for Consult: Community-acquired pneumonia    Referring Physician: Dr. Janece Canterbury  Principal Problem:  *Pneumonia Active Problems:  SIRS (systemic inflammatory response syndrome)  Weakness  Fever  Renal failure (ARF), acute on chronic  HTN (hypertension)  Rhabdomyolysis      . amLODipine  5 mg Oral Daily  . docusate sodium  100 mg Oral BID  . lisinopril  20 mg Oral Daily  . omega-3 acid ethyl esters  1 g Oral Daily  . pantoprazole  40 mg Oral Daily  . piperacillin-tazobactam (ZOSYN)  IV  3.375 g Intravenous Q8H  . polyethylene glycol  17 g Oral Daily  . sodium chloride  3 mL Intravenous Q12H  . Tamsulosin HCl  0.4 mg Oral Daily  . timolol  1 drop Both Eyes BID  . vancomycin  1,000 mg Intravenous Q24H  . [DISCONTINUED] levalbuterol  0.63 mg Nebulization TID    Recommendations: 1. Sputum for Gram stain and culture 2. Continue vancomycin and piperacillin tazobactam   Assessment: He has community-acquired pneumonia. Although I agree with empiric vancomycin I doubt that the one positive blood culture is significant. He does need broader therapy and we must take an accountant he has been treated at least on several occasions over the past several years for urinary tract infections which puts him at risk for more resistant bacteria. His daughter states that he is now bringing up some dark sputum. We'll see if we can successfully collect sputum for Gram stain and culture.    HPI: Alexander Mcdonald is a 78 y.o. male who is in reasonably good health until 5 days ago when he began to get weak and somewhat confused. He was brought to the hospital and was found to have fever of 102. Since admission he has developed a productive cough and his chest x-ray now reveals bibasilar infiltrates. One of to admission blood cultures grew coagulase-negative staph that probably  represents an incident significant skin contaminant rather than the cause of his pneumonia. He has had problems with enlarged prostate and recurrent urinary tract infections over the past few years. In October he was seen in the emergency department for urinary retention. A Foley catheter was placed temporarily and he was found to have a Citrobacter UTI. It is unclear what antibiotic therapy he received. Foley catheter was removed and he has not had any further urinary symptoms since that time according to his daughter, Alexander Mcdonald. Her husband recalls that he had been having some dry cough around Thanksgiving but seemed to get better spontaneously until he fell ill 5 days ago. He has had no known sick contacts or recent unusual travel or animal exposure.   Review of Systems: Review of systems not obtained due to patient factors.  Past Medical History  Diagnosis Date  . ACUTE RENAL FAILURE W/LESION OF TUBULAR NECROSIS 01/04/2007  . CAROTID ARTERY DISEASE 06/01/2009  . DEPRESSION 07/25/2006  . DIVERTICULOSIS OF COLON 02/01/2007  . GOUT 07/25/2006  . HEMORRHOID, THROMBOSED 02/15/2009  . HYPERCHOLESTEROLEMIA 07/25/2006  . HYPERTENSION 07/25/2006  . PROSTATE CANCER, HX OF 07/25/2006  . Rosacea 09/28/2008  . WEIGHT LOSS 09/01/2009  . Glaucoma(365)     History  Substance Use Topics  . Smoking status: Former Smoker    Quit date: 01/17/1988  . Smokeless  tobacco: Never Used  . Alcohol Use: Yes    History reviewed. No pertinent family history. Allergies  Allergen Reactions  . Shellfish Allergy Anaphylaxis    Pt is allergic to mussels     OBJECTIVE: Blood pressure 163/74, pulse 100, temperature 98.4 F (36.9 C), temperature source Oral, resp. rate 20, height 5' 5.5" (1.664 m), weight 71.8 kg (158 lb 4.6 oz), SpO2 92.00%. General: He is very lethargic and has difficulty answering questions. He has no increased work of breathing Skin: No rash Lungs: Few scattered crackles without wheezes Cor: 2/6 systolic  murmur heard best at the left upper sternal border Abdomen: Distended but soft and nontender  Microbiology: Recent Results (from the past 240 hour(s))  CULTURE, BLOOD (ROUTINE X 2)     Status: Normal (Preliminary result)   Collection Time   12/25/11  2:10 AM      Component Value Range Status Comment   Specimen Description BLOOD BLOOD RIGHT FOREARM   Final    Special Requests BOTTLES DRAWN AEROBIC AND ANAEROBIC 2 CC EACH   Final    Culture  Setup Time 12/25/2011 10:04   Final    Culture     Final    Value:        BLOOD CULTURE RECEIVED NO GROWTH TO DATE CULTURE WILL BE HELD FOR 5 DAYS BEFORE ISSUING A FINAL NEGATIVE REPORT   Report Status PENDING   Incomplete   CULTURE, BLOOD (ROUTINE X 2)     Status: Normal   Collection Time   12/25/11  2:10 AM      Component Value Range Status Comment   Specimen Description BLOOD RIGHT ANTECUBITAL   Final    Special Requests BOTTLES DRAWN AEROBIC AND ANAEROBIC 5 CC EACH   Final    Culture  Setup Time 12/25/2011 10:04   Final    Culture     Final    Value: STAPHYLOCOCCUS SPECIES (COAGULASE NEGATIVE)     Note: THE SIGNIFICANCE OF ISOLATING THIS ORGANISM FROM A SINGLE SET OF BLOOD CULTURES WHEN MULTIPLE SETS ARE DRAWN IS UNCERTAIN. PLEASE NOTIFY THE MICROBIOLOGY DEPARTMENT WITHIN ONE WEEK IF SPECIATION AND SENSITIVITIES ARE REQUIRED.     Note: Gram Stain Report Called to,Read Back By and Verified With: AMANDA THOMPSON 12/26/11 0830 BY SMITHERSJ   Report Status 12/27/2011 FINAL   Final   CULTURE, BLOOD (ROUTINE X 2)     Status: Normal (Preliminary result)   Collection Time   12/26/11  9:50 AM      Component Value Range Status Comment   Specimen Description BLOOD RIGHT ARM   Final    Special Requests BOTTLES DRAWN AEROBIC AND ANAEROBIC 5CC   Final    Culture  Setup Time 12/26/2011 20:54   Final    Culture     Final    Value:        BLOOD CULTURE RECEIVED NO GROWTH TO DATE CULTURE WILL BE HELD FOR 5 DAYS BEFORE ISSUING A FINAL NEGATIVE REPORT   Report  Status PENDING   Incomplete   CULTURE, BLOOD (ROUTINE X 2)     Status: Normal (Preliminary result)   Collection Time   12/26/11  9:55 AM      Component Value Range Status Comment   Specimen Description BLOOD RIGHT HAND   Final    Special Requests BOTTLES DRAWN AEROBIC ONLY 5CC   Final    Culture  Setup Time 12/26/2011 20:54   Final    Culture     Final  Value:        BLOOD CULTURE RECEIVED NO GROWTH TO DATE CULTURE WILL BE HELD FOR 5 DAYS BEFORE ISSUING A FINAL NEGATIVE REPORT   Report Status PENDING   Incomplete     Michel Bickers, MD [place] [facility] [phone] pager   [phone] cell 12/27/2011, 5:30 PM

## 2011-12-27 NOTE — Progress Notes (Signed)
Clinical Social Work Department BRIEF PSYCHOSOCIAL ASSESSMENT 12/27/2011  Patient:  Alexander Mcdonald, Alexander Mcdonald     Account Number:  [id number]     Admit date:  12/25/2011  Clinical Social Worker:  Renold Genta  Date/Time:  12/27/2011 04:16 PM  Referred by:  Physician  Date Referred:  12/27/2011 Referred for  SNF Placement   Other Referral:   Interview type:  Family Other interview type:    PSYCHOSOCIAL DATA Living Status:  WIFE Admitted from facility:   Level of care:   Primary support name:  Homar Loudy (daughter) c#: [phone] Primary support relationship to patient:  CHILD, ADULT Degree of support available:   good    CURRENT CONCERNS Current Concerns  Post-Acute Placement   Other Concerns:    SOCIAL WORK ASSESSMENT / PLAN CSW spoke with patient's daughter, [name] at bedside re: discharge planning. Note PT changed their recommendation from home health to SNF.   Assessment/plan status:  Information/Referral to [employer] Other assessment/ plan:   Information/referral to community resources:   CSW completed FL2 and faxed information out to [facility] - provided bed offers to daughter.    PATIENT'S/FAMILY'S RESPONSE TO PLAN OF CARE: Daughter states that she feels he would progress back to being able to return home with home health before he is discharged from the hospital. CSW provided bed offers and stressed that we would need to plan for SNF incase patient becomes stable for discharge.        Winfred Leeds, [facility] Clinical Social Worker cell #: [phone]

## 2011-12-27 NOTE — Evaluation (Signed)
Occupational Therapy Evaluation Patient Details Name: Alexander Mcdonald MRN: [id number] DOB: March 29, 1926 Today's Date: 12/27/2011 Time: [id number] OT Time Calculation (min): 26 min  OT Assessment / Plan / Recommendation Clinical Impression  This 78 y.o. male admitted with PNA.  Pt. presents to OT with generalized weakness and decreased activity tolerance.  HR 116 and sats 96 on 2L, dyspnea 3/4.  Pt. will benefit from OT to maximize safety and independence with BADLs to allow him to return to independent level after SNF level rehab. Pt. is very deconditioned and will likey either need 24 hour assist at D/C, or SNF    OT Assessment  Patient needs continued OT Services    Follow Up Recommendations  Supervision/Assistance - 24 hour;SNF    Barriers to Discharge Decreased caregiver support    Equipment Recommendations  3 in 1 bedside comode;Tub/shower bench    Recommendations for Other Services    Frequency  Min 2X/week    Precautions / Restrictions Precautions Precautions: Fall Precaution Comments: sats dropped 12/10 after in recliner , keep on O2   Pertinent Vitals/Pain     ADL  Eating/Feeding: Set up Where Assessed - Eating/Feeding: Chair Grooming: Wash/dry hands;Wash/dry face;Set up Where Assessed - Grooming: Supported sitting;Unsupported sitting Upper Body Bathing: Minimal assistance Where Assessed - Upper Body Bathing: Supported sitting Lower Body Bathing: Moderate assistance Where Assessed - Lower Body Bathing: Supported sit to stand Upper Body Dressing: Moderate assistance Where Assessed - Upper Body Dressing: Unsupported sitting Lower Body Dressing: +1 Total assistance (due to fatigue) Where Assessed - Lower Body Dressing: Supported sit to Lobbyist: +2 Total assistance Toilet Transfer: Patient Percentage: 60% Toilet Transfer Method: Arts development officer: Therapist, occupational and Hygiene: Maximal assistance Where  Assessed - Best boy and Hygiene: Standing Equipment Used: Rolling walker;Gait belt Transfers/Ambulation Related to ADLs: ambulates short distances with total A +2 (pt ~60%) ADL Comments: Pt fatigues quickly with activity    OT Diagnosis: Generalized weakness;Altered mental status  OT Problem List: Decreased strength;Decreased activity tolerance;Impaired balance (sitting and/or standing);Decreased safety awareness;Decreased knowledge of use of DME or AE OT Treatment Interventions: Self-care/ADL training;DME and/or AE instruction;Therapeutic activities;Patient/family education;Balance training   OT Goals Acute Rehab OT Goals OT Goal Formulation: With patient Time For Goal Achievement: 01/10/12 Potential to Achieve Goals: Good ADL Goals Pt Will Perform Grooming: with min assist;Standing at sink ADL Goal: Grooming - Progress: Goal set today Pt Will Perform Upper Body Bathing: with supervision;Sitting, chair ADL Goal: Upper Body Bathing - Progress: Goal set today Pt Will Perform Lower Body Bathing: with min assist;Sit to stand from chair;Sit to stand from bed ADL Goal: Lower Body Bathing - Progress: Goal set today Pt Will Perform Lower Body Dressing: with min assist;Sit to stand from chair;Sit to stand from bed ADL Goal: Lower Body Dressing - Progress: Goal set today Pt Will Transfer to Toilet: with min assist;Ambulation;Comfort height toilet ADL Goal: Toilet Transfer - Progress: Goal set today Additional ADL Goal #1: Pt will participate in 25 mins therapeutic activity with no more than 2 rest breaks to increase endurance needed for BADLs ADL Goal: Additional Goal #1 - Progress: Goal set today  Visit Information  Last OT Received On: 12/27/11 Assistance Needed: +2 PT/OT Co-Evaluation/Treatment: Yes    Subjective Data  Subjective: "I think I need to turn around" Patient Stated Goal: Did not state   Prior [place] Lives With:  Spouse Available Help at Discharge: Family Type  of Home: House Home Access: Stairs to enter [employer] of Steps: 2 Home Layout: Two level;Able to live on main level with bedroom/bathroom Bathroom Shower/Tub: Chiropodist: Standard Bathroom Accessibility: Yes Home Adaptive Equipment: None Additional Comments: pt was primary caregiver, cooking, etc. Prior Function Level of Independence: Independent Able to Take Stairs?: Yes Driving: Yes Vocation: Retired Corporate investment banker: No difficulties         Vision/Perception     Cognition  Overall Cognitive Status: Impaired Area of Impairment: Memory;Following commands Arousal/Alertness: Lethargic Orientation Level: Disoriented to;Time;Situation Behavior During Session: Lethargic Following Commands: Follows one step commands consistently (with moderate cues) Cognition - Other Comments: sluggish responses , required questions asked more than once.  Pt. states it's 1956.      Extremity/Trunk Assessment Right Upper Extremity Assessment RUE ROM/Strength/Tone: Deficits RUE ROM/Strength/Tone Deficits: grossly 4/5 RUE Coordination: WFL - gross/fine motor Left Upper Extremity Assessment LUE ROM/Strength/Tone: Deficits LUE ROM/Strength/Tone Deficits: grossly 4/5 LUE Coordination: WFL - gross/fine motor     Mobility Bed Mobility Bed Mobility: Supine to Sit;Sitting - Scoot to Edge of Bed Supine to Sit: 2: Max assist Sitting - Scoot to [employer] of Bed: 3: Mod assist Details for Bed Mobility Assistance: Pt. requires assist to move legs to EOB and to lift trunk from bed Transfers Transfers: Sit to Stand;Stand to Sit Sit to Stand: 1: +2 Total assist;From bed Sit to Stand: Patient Percentage: 60% Stand to Sit: 3: Mod assist Details for Transfer Assistance: tactile cues to lean forward, posterior leaning     Shoulder Instructions     Exercise     Balance Balance Balance Assessed: Yes Static  Sitting Balance Static Sitting - Balance Support: Feet supported Static Sitting - Level of Assistance: 3: Mod assist;2: Max assist Static Sitting - Comment/# of Minutes: intermittently min assistance and mod assistance to support in upright as pt is posteriorly leaning.   End of Session OT - End of Session Equipment Utilized During Treatment: Gait belt Activity Tolerance: Patient limited by fatigue Patient left: in chair;with call bell/phone within reach Nurse Communication: Mobility status  [place], Kanna Dafoe M 12/27/2011, 9:49 PM

## 2011-12-27 NOTE — Progress Notes (Signed)
Clinical Social Work Department CLINICAL SOCIAL WORK PLACEMENT NOTE 12/27/2011  Patient:  VIRLYN, TIMMER  Account Number:  [id number] Admit date:  12/25/2011  Clinical Social Worker:  Renold Genta  Date/time:  12/27/2011 04:21 PM  Clinical Social Work is seeking post-discharge placement for this patient at the following level of care:   SKILLED NURSING   (*CSW will update this form in Epic as items are completed)   12/27/2011  Patient/family provided with [facility] Department of Clinical Social Work's list of facilities offering this level of care within the geographic area requested by the patient (or if unable, by the patient's family).  12/27/2011  Patient/family informed of their freedom to choose among providers that offer the needed level of care, that participate in Medicare, Medicaid or managed care program needed by the patient, have an available bed and are willing to accept the patient.  12/27/2011  Patient/family informed of [facility]' ownership interest in [facility], as well as of the fact that they are under no obligation to receive care at this facility.  PASARR submitted to EDS on 12/27/2011 PASARR number received from EDS on 12/27/2011  FL2 transmitted to all facilities in geographic area requested by pt/family on  12/27/2011 FL2 transmitted to all facilities within larger geographic area on   Patient informed that his/her managed care company has contracts with or will negotiate with  certain facilities, including the following:     Patient/family informed of bed offers received:  12/27/2011 Patient chooses bed at  Physician recommends and patient chooses bed at    Patient to be transferred to  on   Patient to be transferred to facility by   The following physician request were entered in Epic:   Additional Comments:  Winfred Leeds, [place] Worker cell #: [phone]

## 2011-12-27 NOTE — Progress Notes (Signed)
Physical Therapy Treatment Patient Details Name: Alexander Mcdonald MRN: [id number] DOB: 20-Nov-1926 Today's Date: 12/27/2011 Time: [id number] PT Time Calculation (min): 26 min  PT Assessment / Plan / Recommendation Comments on Treatment Session  Pt. appears more confused, decreased following commands and increased respiratory distress. Pt. may require SNF. family not present.    Follow Up Recommendations  SNF     Does the patient have the potential to tolerate intense rehabilitation     Barriers to Discharge        Equipment Recommendations  Rolling walker with 5" wheels    Recommendations for Other Services    Frequency Min 3X/week   Plan Discharge plan needs to be updated;Frequency remains appropriate    Precautions / Restrictions Precautions Precautions: Fall Precaution Comments: sats dropped 12/10 after in recliner , keep on O2   Pertinent Vitals/Pain sats 94% 2l HR 116 notable more respiratory distress.    Mobility  Bed Mobility Supine to Sit: 2: Max assist Details for Bed Mobility Assistance: rquiring increased assistance for mobility today  Transfers Transfers: Stand Pivot Transfers Sit to Stand: 1: +2 Total assist;From bed Sit to Stand: Patient Percentage: 60% Stand to Sit: 3: Mod assist Details for Transfer Assistance: tactile cues to lean forward, posterior leaning Ambulation/Gait Ambulation/Gait Assistance: 1: +2 Total assist Ambulation/Gait: Patient Percentage: 60% Ambulation Distance (Feet): 25 Feet Assistive device: Rolling walker Gait Pattern: Decreased stride length;Decreased trunk rotation;Shuffle;Decreased step length - left;Decreased step length - right Gait velocity: decreased, shuffling. General Gait Details: decreased initiation for  gait, retropulsive.initially, requires tactile cues for forward progression and turns.    Exercises     PT Diagnosis:    PT Problem List:   PT Treatment Interventions:     PT Goals Acute Rehab PT Goals Pt will  go Supine/Side to Sit: Independently PT Goal: Supine/Side to Sit - Progress: Progressing toward goal Pt will go Sit to Stand: Independently PT Goal: Sit to Stand - Progress: Progressing toward goal Pt will go Stand to Sit: Independently PT Goal: Stand to Sit - Progress: Progressing toward goal Pt will Ambulate: >150 feet;with modified independence PT Goal: Ambulate - Progress: Progressing toward goal  Visit Information  Last PT Received On: 12/27/11 Assistance Needed: +2    Subjective Data  Subjective: i am ready to go home   Cognition  Overall Cognitive Status: Impaired Area of Impairment: Memory;Following commands Arousal/Alertness: Lethargic Orientation Level: Disoriented to;Time;Situation Behavior During Session: Chief Technology Officer Sitting - Balance Support: Feet supported Static Sitting - Level of Assistance: 3: Mod assist;2: Max assist Static Sitting - Comment/# of Minutes: intermittently min assistance and mod assistance to support in upright as pt is posteriorly leaning. Static Standing Balance Static Standing - Comment/# of Minutes: today requires increased asssitance for balance in standing at mod to max assistance.  End of Session PT - End of Session Equipment Utilized During Treatment: Gait belt Activity Tolerance: Patient limited by fatigue Patient left: in chair;with call bell/phone within reach;with chair alarm set Nurse Communication: Mobility status   GP     Claretha Cooper 12/27/2011, 1:05 PM

## 2011-12-28 DIAGNOSIS — D649 Anemia, unspecified: Secondary | ICD-10-CM | POA: Diagnosis not present

## 2011-12-28 DIAGNOSIS — N189 Chronic kidney disease, unspecified: Secondary | ICD-10-CM | POA: Diagnosis not present

## 2011-12-28 DIAGNOSIS — J189 Pneumonia, unspecified organism: Principal | ICD-10-CM

## 2011-12-28 DIAGNOSIS — I517 Cardiomegaly: Secondary | ICD-10-CM

## 2011-12-28 LAB — LEGIONELLA ANTIGEN, URINE: Legionella Antigen, Urine: NEGATIVE

## 2011-12-28 MED ORDER — LEVALBUTEROL HCL 0.63 MG/3ML IN NEBU
0.6300 mg | INHALATION_SOLUTION | RESPIRATORY_TRACT | Status: DC | PRN
  Filled 2011-12-28: qty 3

## 2011-12-28 MED ORDER — LINEZOLID 600 MG PO TABS
600.0000 mg | ORAL_TABLET | Freq: Two times a day (BID) | ORAL | Status: DC
  Administered 2011-12-28 – 2012-01-02 (×11): 600 mg via ORAL
  Filled 2011-12-28 (×14): qty 1

## 2011-12-28 MED ORDER — SODIUM CHLORIDE 0.9 % IV SOLN
INTRAVENOUS | Status: AC
  Administered 2011-12-28: 15:00:00 via INTRAVENOUS

## 2011-12-28 MED ORDER — LEVALBUTEROL HCL 0.63 MG/3ML IN NEBU
0.6300 mg | INHALATION_SOLUTION | Freq: Three times a day (TID) | RESPIRATORY_TRACT | Status: DC
  Administered 2011-12-28 – 2012-01-01 (×12): 0.63 mg via RESPIRATORY_TRACT
  Filled 2011-12-28 (×17): qty 3

## 2011-12-28 NOTE — Progress Notes (Signed)
  Echocardiogram 2D Echocardiogram has been performed.  Alexander Mcdonald 12/28/2011, 9:11 AM

## 2011-12-28 NOTE — Progress Notes (Signed)
Patient ID: Alexander Mcdonald, male   DOB: [birth year], 78 y.o.   MRN: [id number]    [facility] for Infectious Disease    Date of Admission:  12/25/2011   Total days of antibiotics 3         Principal Problem:  *Pneumonia Active Problems:  SIRS (systemic inflammatory response syndrome)  Weakness  Fever  Renal failure (ARF), acute on chronic  HTN (hypertension)  Rhabdomyolysis      . amLODipine  5 mg Oral Daily  . docusate sodium  100 mg Oral BID  . linezolid  600 mg Oral Q12H  . lisinopril  20 mg Oral Daily  . omega-3 acid ethyl esters  1 g Oral Daily  . pantoprazole  40 mg Oral Daily  . piperacillin-tazobactam (ZOSYN)  IV  3.375 g Intravenous Q8H  . polyethylene glycol  17 g Oral Daily  . sodium chloride  3 mL Intravenous Q12H  . Tamsulosin HCl  0.4 mg Oral Daily  . timolol  1 drop Both Eyes BID  . [DISCONTINUED] vancomycin  1,000 mg Intravenous Q24H    Subjective: He states that he is feeling better. His daughter notes that he did cough up some dark phlegm earlier this morning. He was able to eat lunch.  Objective: Temp:  [97.4 F (36.3 C)-101.7 F (38.7 C)] 100.7 F (38.2 C) (12/12 1407) Pulse Rate:  [75-81] 75  (12/12 1407) Resp:  [20] 20  (12/12 1407) BP: (116-145)/(46-54) 116/54 mmHg (12/12 1407) SpO2:  [94 %-96 %] 94 % (12/12 1407) Weight:  [70 kg (154 lb 5.2 oz)] 70 kg (154 lb 5.2 oz) (12/12 0522)  General: His temperatures appear to be trending down. He is more alert and talkative Skin: No rash Lungs: Diffuse wheezes Cor: Regular S1 with an unchanged 2/6 systolic murmur  Lab Results Lab Results  Component Value Date   WBC 8.7 12/26/2011   HGB 11.5* 12/26/2011   HCT 33.2* 12/26/2011   MCV 85.1 12/26/2011   PLT 163 12/26/2011    Lab Results  Component Value Date   CREATININE 1.45* 12/26/2011   BUN 18 12/26/2011   NA 131* 12/26/2011   K 3.5 12/26/2011   CL 97 12/26/2011   CO2 21 12/26/2011    Lab Results  Component Value Date   ALT 50  12/25/2011   AST 70* 12/25/2011   ALKPHOS 99 12/25/2011   BILITOT 1.9* 12/25/2011      Microbiology: Recent Results (from the past 240 hour(s))  CULTURE, BLOOD (ROUTINE X 2)     Status: Normal (Preliminary result)   Collection Time   12/25/11  2:10 AM      Component Value Range Status Comment   Specimen Description BLOOD BLOOD RIGHT FOREARM   Final    Special Requests BOTTLES DRAWN AEROBIC AND ANAEROBIC 2 CC EACH   Final    Culture  Setup Time 12/25/2011 10:04   Final    Culture     Final    Value:        BLOOD CULTURE RECEIVED NO GROWTH TO DATE CULTURE WILL BE HELD FOR 5 DAYS BEFORE ISSUING A FINAL NEGATIVE REPORT   Report Status PENDING   Incomplete   CULTURE, BLOOD (ROUTINE X 2)     Status: Normal   Collection Time   12/25/11  2:10 AM      Component Value Range Status Comment   Specimen Description BLOOD RIGHT ANTECUBITAL   Final    Special Requests BOTTLES DRAWN AEROBIC AND  ANAEROBIC 5 CC EACH   Final    Culture  Setup Time 12/25/2011 10:04   Final    Culture     Final    Value: STAPHYLOCOCCUS SPECIES (COAGULASE NEGATIVE)     Note: THE SIGNIFICANCE OF ISOLATING THIS ORGANISM FROM A SINGLE SET OF BLOOD CULTURES WHEN MULTIPLE SETS ARE DRAWN IS UNCERTAIN. PLEASE NOTIFY THE MICROBIOLOGY DEPARTMENT WITHIN ONE WEEK IF SPECIATION AND SENSITIVITIES ARE REQUIRED.     Note: Gram Stain Report Called to,Read Back By and Verified With: AMANDA THOMPSON 12/26/11 0830 BY SMITHERSJ   Report Status 12/27/2011 FINAL   Final    Organism ID, Bacteria STAPHYLOCOCCUS SPECIES (COAGULASE NEGATIVE)   Final   CULTURE, BLOOD (ROUTINE X 2)     Status: Normal (Preliminary result)   Collection Time   12/26/11  9:50 AM      Component Value Range Status Comment   Specimen Description BLOOD RIGHT ARM   Final    Special Requests BOTTLES DRAWN AEROBIC AND ANAEROBIC 5CC   Final    Culture  Setup Time 12/26/2011 20:54   Final    Culture     Final    Value:        BLOOD CULTURE RECEIVED NO GROWTH TO DATE CULTURE WILL  BE HELD FOR 5 DAYS BEFORE ISSUING A FINAL NEGATIVE REPORT   Report Status PENDING   Incomplete   CULTURE, BLOOD (ROUTINE X 2)     Status: Normal (Preliminary result)   Collection Time   12/26/11  9:55 AM      Component Value Range Status Comment   Specimen Description BLOOD RIGHT HAND   Final    Special Requests BOTTLES DRAWN AEROBIC ONLY 5CC   Final    Culture  Setup Time 12/26/2011 20:54   Final    Culture     Final    Value:        BLOOD CULTURE RECEIVED NO GROWTH TO DATE CULTURE WILL BE HELD FOR 5 DAYS BEFORE ISSUING A FINAL NEGATIVE REPORT   Report Status PENDING   Incomplete   CULTURE, EXPECTORATED SPUTUM-ASSESSMENT     Status: Normal   Collection Time   12/27/11  6:41 PM      Component Value Range Status Comment   Specimen Description SPUTUM   Final    Special Requests NONE   Final    Sputum evaluation     Final    Value: THIS SPECIMEN IS ACCEPTABLE. RESPIRATORY CULTURE REPORT TO FOLLOW.   Report Status 12/27/2011 FINAL   Final   CULTURE, RESPIRATORY     Status: Normal (Preliminary result)   Collection Time   12/27/11  6:41 PM      Component Value Range Status Comment   Specimen Description SPUTUM   Final    Special Requests NONE   Final    Gram Stain     Final    Value: MODERATE WBC PRESENT,BOTH PMN AND MONONUCLEAR     RARE SQUAMOUS EPITHELIAL CELLS PRESENT     NO ORGANISMS SEEN   Culture PENDING   Incomplete    Report Status PENDING   Incomplete     Studies/Results: No results found.  Assessment: He seems to be improving on empiric therapy for community-acquired pneumonia. I discussed the antibiotic treatment plan with Dr. Posey Pronto and agree with the change the vancomycin to linezolid. I would continue piperacillin tazobactam for now.  Plan: 1. Continue linezolid and piperacillin tazobactam 2. Check sputum culture results 3. Discussed treatment plan  with his daughter  Michel Bickers, MD [facility] [phone]  pager   [phone] cell 12/28/2011, 3:18 PM

## 2011-12-28 NOTE — Progress Notes (Signed)
Patient has had decreased output. Dr. Posey Pronto notified. Will continue to monitor frequently. Bladder scan was only 277ml/hr. Remer Macho RN

## 2011-12-28 NOTE — Progress Notes (Addendum)
[facility] PROGRESS NOTE  Alexander Mcdonald [id number] DOB: [birth year] DOA: 12/25/2011 PCP: [name], MD  Assessment/Plan: Bacterial CAP versus aspiration pneumonia. Thick green sputum, frequent desaturations  - cont pip/tazo and add linezolid.  - Strep pneumo and legionella ag ng - Sputum induction ordered and speech eval ordered  Coag neg staph bacteremia, may be contaminant in the setting of pneumonia. ID note reviewed. Will d/c vanc for now. Creat 1.45, Repeat BCx on 12/10 ng. TTE with EF 55 to 60% and no evidence of infection. May not need TEE. Follow fever, wbc and clinical improvement.   Weakness, generalized: Secondary to above, consider SNF. Will push some IV fluids gently.   Fever: Tmax 101.7 yesterday. Today he is better. Eating with help of daughter and nurse.   Urinary retention : not passing urine well. We will get bladder scan and start I/O foley if he has >300 cc urine collected. He has condom cath. Avoid foley as possible.   HTN (hypertension): One reading of 163/74 noted.    Rhabdomyolysis very mild: Hold statin for now but should be able to resume as outpatient.   Hyponatremia: NA 131, will monitor  Acute on chronic kidney disease: creat 1.45 today.  May be baseline. But wound avoid use of vancomycin on him. May have been prerenal from acute illness.   DIET: Healthy heart  ACCESS: PIV  IVF: None  PROPH: SCDs  Code Status: Full code  Family Communication: Discussed plan with daughter today  Disposition Plan: Pending further evaluation for endocarditis to determine antibiotic course.   Consultants:  - Infectious disease   Procedures:  2-D echo  Antibiotics:  IV vancomycin started 12/10: Day 3 PIP/tazo  HPI/Subjective:  Daughter is present in the room. No fever noted. resp status stable.   Objective: Filed Vitals:   12/27/11 2032 12/28/11 0050 12/28/11 0522 12/28/11 1407  BP: 119/46  145/52 116/54  Pulse: 81  76 75  Temp:  101.7 F (38.7 C) 99.3 F (37.4 C) 97.4 F (36.3 C) 100.7 F (38.2 C)  TempSrc: Axillary Oral Oral Axillary  Resp: 20  20 20   Height:      Weight:   70 kg (154 lb 5.2 oz)   SpO2: 95%  96% 94%    Intake/Output Summary (Last 24 hours) at 12/28/11 1420 Last data filed at 12/28/11 1300  Gross per 24 hour  Intake    790 ml  Output    575 ml  Net    215 ml   Filed Weights   12/26/11 0641 12/27/11 0427 12/28/11 0522  Weight: 71.4 kg (157 lb 6.5 oz) 71.8 kg (158 lb 4.6 oz) 70 kg (154 lb 5.2 oz)    Exam: General: CM, mild perioral cyanosis and tachypnea  Cardiovascular: Regular rhythm, borderline tachycardia  Respiratory: Rhonchorous with bilateral diminished breath sounds and rare rales  Abdomen: Soft, nontender, nondistended, positive bowel sounds  Extremities: Trace edema   Data Reviewed: Basic Metabolic Panel:  Lab 0000000 0519 12/25/11 0900 12/25/11 0210  NA 131* 134* 130*  K 3.5 3.8 3.7  CL 97 102 96  CO2 21 22 20   GLUCOSE 111* 108* 139*  BUN 18 22 26*  CREATININE 1.45* 1.72* 2.01*  CALCIUM 8.6 8.4 8.9  MG -- -- --  PHOS -- -- --   Liver Function Tests:  Lab 12/25/11 0210  AST 70*  ALT 50  ALKPHOS 99  BILITOT 1.9*  PROT 7.0  ALBUMIN 3.2*   No results found for this  basename: LIPASE:5,AMYLASE:5 in the last 168 hours No results found for this basename: AMMONIA:5 in the last 168 hours CBC:  Lab 12/26/11 0519 12/25/11 0900 12/25/11 0520 12/25/11 0210  WBC 8.7 8.9 9.3 16.1*  NEUTROABS -- 6.8 7.3 --  HGB 11.5* 11.7* 11.0* 5.2*  HCT 33.2* 33.8* 31.1* 14.6*  MCV 85.1 85.6 84.3 85.4  PLT 163 148* 148* 260   Cardiac Enzymes:  Lab 12/26/11 0519 12/25/11 0900 12/25/11 0520 12/25/11 0210  CKTOTAL 561* 1043* -- 272*  CKMB -- -- -- --  CKMBINDEX -- -- -- --  TROPONINI -- <0.30 <0.30 --   BNP (last 3 results) No results found for this basename: PROBNP:3 in the last 8760 hours CBG:  Lab 12/25/11 0148  GLUCAP 140*    Recent Results (from the past 240  hour(s))  CULTURE, BLOOD (ROUTINE X 2)     Status: Normal (Preliminary result)   Collection Time   12/25/11  2:10 AM      Component Value Range Status Comment   Specimen Description BLOOD BLOOD RIGHT FOREARM   Final    Special Requests BOTTLES DRAWN AEROBIC AND ANAEROBIC 2 CC EACH   Final    Culture  Setup Time 12/25/2011 10:04   Final    Culture     Final    Value:        BLOOD CULTURE RECEIVED NO GROWTH TO DATE CULTURE WILL BE HELD FOR 5 DAYS BEFORE ISSUING A FINAL NEGATIVE REPORT   Report Status PENDING   Incomplete   CULTURE, BLOOD (ROUTINE X 2)     Status: Normal   Collection Time   12/25/11  2:10 AM      Component Value Range Status Comment   Specimen Description BLOOD RIGHT ANTECUBITAL   Final    Special Requests BOTTLES DRAWN AEROBIC AND ANAEROBIC 5 CC EACH   Final    Culture  Setup Time 12/25/2011 10:04   Final    Culture     Final    Value: STAPHYLOCOCCUS SPECIES (COAGULASE NEGATIVE)     Note: THE SIGNIFICANCE OF ISOLATING THIS ORGANISM FROM A SINGLE SET OF BLOOD CULTURES WHEN MULTIPLE SETS ARE DRAWN IS UNCERTAIN. PLEASE NOTIFY THE MICROBIOLOGY DEPARTMENT WITHIN ONE WEEK IF SPECIATION AND SENSITIVITIES ARE REQUIRED.     Note: Gram Stain Report Called to,Read Back By and Verified With: AMANDA THOMPSON 12/26/11 0830 BY SMITHERSJ   Report Status 12/27/2011 FINAL   Final    Organism ID, Bacteria STAPHYLOCOCCUS SPECIES (COAGULASE NEGATIVE)   Final   CULTURE, BLOOD (ROUTINE X 2)     Status: Normal (Preliminary result)   Collection Time   12/26/11  9:50 AM      Component Value Range Status Comment   Specimen Description BLOOD RIGHT ARM   Final    Special Requests BOTTLES DRAWN AEROBIC AND ANAEROBIC 5CC   Final    Culture  Setup Time 12/26/2011 20:54   Final    Culture     Final    Value:        BLOOD CULTURE RECEIVED NO GROWTH TO DATE CULTURE WILL BE HELD FOR 5 DAYS BEFORE ISSUING A FINAL NEGATIVE REPORT   Report Status PENDING   Incomplete   CULTURE, BLOOD (ROUTINE X 2)      Status: Normal (Preliminary result)   Collection Time   12/26/11  9:55 AM      Component Value Range Status Comment   Specimen Description BLOOD RIGHT HAND   Final  Special Requests BOTTLES DRAWN AEROBIC ONLY 5CC   Final    Culture  Setup Time 12/26/2011 20:54   Final    Culture     Final    Value:        BLOOD CULTURE RECEIVED NO GROWTH TO DATE CULTURE WILL BE HELD FOR 5 DAYS BEFORE ISSUING A FINAL NEGATIVE REPORT   Report Status PENDING   Incomplete   CULTURE, EXPECTORATED SPUTUM-ASSESSMENT     Status: Normal   Collection Time   12/27/11  6:41 PM      Component Value Range Status Comment   Specimen Description SPUTUM   Final    Special Requests NONE   Final    Sputum evaluation     Final    Value: THIS SPECIMEN IS ACCEPTABLE. RESPIRATORY CULTURE REPORT TO FOLLOW.   Report Status 12/27/2011 FINAL   Final   CULTURE, RESPIRATORY     Status: Normal (Preliminary result)   Collection Time   12/27/11  6:41 PM      Component Value Range Status Comment   Specimen Description SPUTUM   Final    Special Requests NONE   Final    Gram Stain     Final    Value: MODERATE WBC PRESENT,BOTH PMN AND MONONUCLEAR     RARE SQUAMOUS EPITHELIAL CELLS PRESENT     NO ORGANISMS SEEN   Culture PENDING   Incomplete    Report Status PENDING   Incomplete      Studies: Dg Chest Port 1 View  12/26/2011  *RADIOLOGY REPORT*  Clinical Data: Hypoxia, shortness of breath, evaluate for pneumonia  PORTABLE CHEST - 1 VIEW  Comparison: 12/25/2011; 02/08/2007  Findings:  Grossly unchanged cardiac silhouette and mediastinal contours given persistently reduced lung volumes.  Worsening bilateral mid and lower lung heterogeneous opacities, left greater than right.  Query trace left-sided effusion.  No definite pneumothorax.  Unchanged bones.  IMPRESSION: Worsening bilateral mid and lower lung heterogeneous opacities, left greater than right, atelectasis versus infiltrate. Further evaluation with a PA and lateral chest  radiograph may be obtained as clinically indicated.   Original Report Authenticated By: Jake Seats, MD     Scheduled Meds:   . amLODipine  5 mg Oral Daily  . docusate sodium  100 mg Oral BID  . lisinopril  20 mg Oral Daily  . omega-3 acid ethyl esters  1 g Oral Daily  . pantoprazole  40 mg Oral Daily  . piperacillin-tazobactam (ZOSYN)  IV  3.375 g Intravenous Q8H  . polyethylene glycol  17 g Oral Daily  . sodium chloride  3 mL Intravenous Q12H  . Tamsulosin HCl  0.4 mg Oral Daily  . timolol  1 drop Both Eyes BID  . vancomycin  1,000 mg Intravenous Q24H  . [DISCONTINUED] levalbuterol  0.63 mg Nebulization TID   Continuous Infusions:   Principal Problem:  *Pneumonia Active Problems:  Weakness  Fever  Renal failure (ARF), acute on chronic  HTN (hypertension)  Rhabdomyolysis  SIRS (systemic inflammatory response syndrome)  Time spent: 35 min Tamicka Shimon V.  [facility] Pager [phone]. If 8PM-8AM, please contact night-coverage at [email or web address], password [facility] 12/28/2011, 2:20 PM  LOS: 3 days

## 2011-12-28 NOTE — Evaluation (Signed)
Clinical/Bedside Swallow Evaluation Patient Details  Name: Alexander Mcdonald MRN: [id number] Date of Birth: Oct 25, 1926  Today's Date: 12/28/2011 Time: [id number] SLP Time Calculation (min): 34 min  Past Medical History:  Past Medical History  Diagnosis Date  . ACUTE RENAL FAILURE W/LESION OF TUBULAR NECROSIS 01/04/2007  . CAROTID ARTERY DISEASE 06/01/2009  . DEPRESSION 07/25/2006  . DIVERTICULOSIS OF COLON 02/01/2007  . GOUT 07/25/2006  . HEMORRHOID, THROMBOSED 02/15/2009  . HYPERCHOLESTEROLEMIA 07/25/2006  . HYPERTENSION 07/25/2006  . PROSTATE CANCER, HX OF 07/25/2006  . Rosacea 09/28/2008  . WEIGHT LOSS 09/01/2009  . Glaucoma(365)    Past Surgical History:  Past Surgical History  Procedure Date  . Carotid endarterectomy   . Cataract extraction    HPI:  78 year old male with history of hypertension, chronic kidney disease, gout, carotid artery disease and prostate cancer was brought to the ER patient was found to be feeling weak. As per patient's wife patient has been feeling weak for last 2 days and complaining of generalized body ache. Last evening he felt very weak to walk and he was brought to the ER. In the ER he was found to be febrile with temperatures around 102F. Chest x-ray and UA were unremarkable. EKG showed sinus rhythm with PVCs. Patient temperature improved with Tylenol. Patient otherwise denies any chest pain shortness of breath nausea vomiting or diarrhea. Patient's initial hemoglobin was on 5 and repeat was 11. At this time patient has been admitted for further management.   Assessment / Plan / Recommendation Clinical Impression  Patient appears very SOB; O2 Sats. 94%.  CXR:  Pneumonia (aspiration versus community acquired).  Patient with minimal overt s/s of aspiration noted at bedside, however, silent aspiration can not be ruled out.  Patient did have multiple swallows with thin liquids, and voice is weak/breathy, but not wet.  Patient was noted to burp, after only a few  sips/bites.  Question esophageal dysphagia with possible aspiration of reflux/backflow into pharynx.  An objective evaluation is indicated to r/o aspiration.  If esophageal findings are noted on the MBS, patient may also need an esophagram.     Aspiration Risk  Moderate    Diet Recommendation Dysphagia 3 (Mechanical Soft);Thin liquid   Medication Administration: Whole meds with puree Supervision: Staff feed patient;Full supervision/cueing for compensatory strategies Compensations: Slow rate;Small sips/bites (Frequent rest breaks) Postural Changes and/or Swallow Maneuvers: Seated upright 90 degrees;Upright 30-60 min after meal    Other  Recommendations Recommended Consults: MBS;Consider esophageal assessment Oral Care Recommendations: Oral care QID;Staff/trained caregiver to provide oral care Other Recommendations: Have oral suction available;Clarify dietary restrictions   Follow Up Recommendations       Frequency and Duration        Pertinent Vitals/Pain n/a    SLP Swallow Goals Patient will consume recommended diet without observed clinical signs of aspiration with: Moderate assistance;Supervision/safety Patient will utilize recommended strategies during swallow to increase swallowing safety with: Moderate assistance   Swallow Study Prior Functional Status       General HPI: 78 year old male with history of hypertension, chronic kidney disease, gout, carotid artery disease and prostate cancer was brought to the ER patient was found to be feeling weak. As per patient's wife patient has been feeling weak for last 2 days and complaining of generalized body ache. Last evening he felt very weak to walk and he was brought to the ER. In the ER he was found to be febrile with temperatures around 102F. Chest x-ray and UA were unremarkable. EKG showed  sinus rhythm with PVCs. Patient temperature improved with Tylenol. Patient otherwise denies any chest pain shortness of breath nausea vomiting  or diarrhea. Patient's initial hemoglobin was on 5 and repeat was 11. At this time patient has been admitted for further management. Type of Study: Bedside swallow evaluation Previous Swallow Assessment: None Diet Prior to this Study: Regular;Thin liquids Temperature Spikes Noted: Yes Respiratory Status: Supplemental O2 delivered via (comment) (SOB; Increased RR) History of Recent Intubation: No Behavior/Cognition: Alert;Cooperative;Requires cueing;Decreased sustained attention Oral Cavity - Dentition: Adequate natural dentition Self-Feeding Abilities: Total assist Patient Positioning: Upright in bed Baseline Vocal Quality: Breathy;Low vocal intensity Volitional Cough: Weak Volitional Swallow: Unable to elicit    Oral/Motor/Sensory Function Overall Oral Motor/Sensory Function: Appears within functional limits for tasks assessed   Ice Chips Ice chips: Not tested   Thin Liquid Thin Liquid: Impaired Presentation: Straw Pharyngeal  Phase Impairments: Multiple swallows    Nectar Thick Nectar Thick Liquid: Not tested   Honey Thick Honey Thick Liquid: Not tested   Puree Puree: Impaired Presentation: Spoon Pharyngeal Phase Impairments: Multiple swallows   Solid   GO    Solid: Within functional limits       Fatih Stalvey T 12/28/2011,3:38 PM

## 2011-12-29 DIAGNOSIS — J189 Pneumonia, unspecified organism: Secondary | ICD-10-CM | POA: Diagnosis not present

## 2011-12-29 DIAGNOSIS — R509 Fever, unspecified: Secondary | ICD-10-CM | POA: Diagnosis not present

## 2011-12-29 DIAGNOSIS — D649 Anemia, unspecified: Secondary | ICD-10-CM | POA: Diagnosis not present

## 2011-12-29 DIAGNOSIS — N170 Acute kidney failure with tubular necrosis: Secondary | ICD-10-CM | POA: Diagnosis not present

## 2011-12-29 LAB — BASIC METABOLIC PANEL
BUN: 38 mg/dL — ABNORMAL HIGH (ref 6–23)
CO2: 24 mEq/L (ref 19–32)
Calcium: 8.5 mg/dL (ref 8.4–10.5)
Chloride: 95 mEq/L — ABNORMAL LOW (ref 96–112)
Creatinine, Ser: 2.03 mg/dL — ABNORMAL HIGH (ref 0.50–1.35)
GFR calc Af Amer: 33 mL/min — ABNORMAL LOW (ref >90)
GFR calc non Af Amer: 29 mL/min — ABNORMAL LOW (ref >90)
Glucose, Bld: 111 mg/dL — ABNORMAL HIGH (ref 70–99)
Potassium: 4.1 mEq/L (ref 3.5–5.1)
Sodium: 129 mEq/L — ABNORMAL LOW (ref 135–145)

## 2011-12-29 LAB — CULTURE, BLOOD (ROUTINE X 2)

## 2011-12-29 NOTE — Progress Notes (Signed)
Occupational Therapy Treatment Patient Details Name: Alexander Mcdonald MRN: [id number] DOB: [birth year] Today's Date: 12/29/2011 Time: [id number] OT Time Calculation (min): 27 min  OT Assessment / Plan / Recommendation Comments on Treatment Session Pt participated in OT treatment session today with focus on sit at EOB to A w/ increasing endurance and activity tolerance/participation in ADL's. Pt cont w/ overall generalized weakness and decreased activity tolerance, he is limited by fatigue and dyspena. He should benefit from cont acute OT to assist with maximizing I w/ ADL's, functional transfers and mobility in preparation for anticipated d/c to SNF rehab prior to return home.    Follow Up Recommendations  Supervision/Assistance - 24 hour;SNF    Barriers to Discharge       Equipment Recommendations  3 in 1 bedside comode;Tub/shower seat    Recommendations for Other Services    Frequency Min 2X/week   Plan      Precautions / Restrictions Precautions Precautions: Fall Precaution Comments: sats dropped 12/10 after in recliner , keep on O2   Pertinent Vitals/Pain No c/o pain    ADL  Transfers/Ambulation Related to ADLs: Pt able to sit at EOB w/ Supervision A x93min w/ 1 rest break in prep for increased activity tolerance.Pt was then assisted back to bed w/ Max A-total assist +1-2 due to fatigue. ADL Comments: Pt participated in OT treatment session today with focus on sit at EOB to A w/ increasing endurance and activity tolerance/participation in ADL's. Pt cont w/ overall generalized weakness and decreased activity tolerance, he is limited by fatigue and dyspena. He should benefit from cont acute OT to assist with maximizing I w/ ADL's, functional transfers and mobility in preparation for anticipated d/c to SNF rehab prior to return home.    OT Diagnosis:    OT Problem List:   OT Treatment Interventions:     OT Goals ADL Goals ADL Goal: Additional Goal #1 - Progress: Progressing toward  goals  Visit Information  Last OT Received On: 12/29/11 Assistance Needed: +2    Subjective Data  Subjective: "I'm not sure that's a good idea right now" Patient Stated Goal: Spoke w/ pt re:SNF, stated that he is currently not sure   Prior Functioning       Cognition  Overall Cognitive Status: Appears within functional limits for tasks assessed/performed Arousal/Alertness: Lethargic Behavior During Session: Lethargic Following Commands: Follows one step commands consistently Cognition - Other Comments: Pt tired today but able to follow commands and appropriate during session    Mobility  Shoulder Instructions Bed Mobility Bed Mobility: Supine to Sit Supine to Sit: 2: Max assist Sitting - Scoot to Edge of Bed: 4: Min assist;3: Mod assist Sit to Supine: 1: +1 Total assist;1: +2 Total assist Sit to Supine: Patient Percentage: 20% Details for Bed Mobility Assistance: Pt required increased assist due to fatigue after sitting at EOB, very lethargic. Transfers Transfers:  (Pt declined "That's not a good idea" too tired) Sit to Stand: 4: Min assist;From bed;From chair/3-in-1 Stand to Sit: 4: Min assist;To chair/3-in-1;To bed Details for Transfer Assistance: verbal cues for hand placement, used RW to get to [facility], pt began BM upon standing so assisted to [facility], short rest breaks between transfers required for SOB and fatigue          Balance Static Sitting Balance Static Sitting - Balance Support: Feet supported;No upper extremity supported Static Sitting - Level of Assistance: 5: Stand by assistance Static Sitting - Comment/# of Minutes: 50min with 1 rest break before assisting  pt back to bed due to fatigue.   End of Session OT - End of Session Activity Tolerance: Patient limited by fatigue Patient left: in bed;with call bell/phone within reach  [place], [place] 12/29/2011, 1:57 PM

## 2011-12-29 NOTE — Progress Notes (Signed)
Nutrition Education Note  RD drawn to chart secondary to current prescription of zyvox, a medication with potential interactions with high tyramine-containing foods.  RD provided "Tyramine-Restricted Nutrition Therapy" handout from the [employer] of [employer] and discussed importance of this restriction while taking current medication. Reviewed list of foods to avoid. Teach back method used.  Expect good compliance.  Body mass index is 25.70 kg/(m^2). Pt meets criteria for overweight based on current BMI.  Current diet order is Dysphagia 3, patient is consuming approximately 40% of meals at this time. Additional labs and medications reviewed.  Pt with ongoing assessment by SLP.  MBS has been ordered for pt.  Pt denies swallowing difficulties.  RD will change current diet to Low Tyramine Diet to prevent potential food-medication interaction.   No further nutrition interventions warranted at this time. RD contact information provided. If additional nutrition issues arise, please re-consult RD.  Brynda Greathouse, MS RD LDN Clinical Inpatient Dietitian Pager: [phone] Weekend/After hours pager: [phone]

## 2011-12-29 NOTE — Progress Notes (Signed)
Patient ID: Alexander Mcdonald, male   DOB: Oct 27, 1926, 78 y.o.   MRN: [id number]    [facility]    Date of Admission:  12/25/2011           Day 4 piperacillin and tazobactam        Day 2 linezolid Principal Problem:  *Pneumonia Active Problems:  SIRS (systemic inflammatory response syndrome)  Weakness  Fever  Renal failure (ARF), acute on chronic  HTN (hypertension)  Rhabdomyolysis  Subjective: He still has a loose and productive cough. He has a very poor appetite. He states that he is feeling about the same.   Objective: Temp:  [97.2 F (36.2 C)-99.7 F (37.6 C)] 97.2 F (36.2 C) (12/13 1455) Pulse Rate:  [65-83] 65  (12/13 1455) Resp:  [18-20] 20  (12/13 1455) BP: (126-135)/(48-58) 126/48 mmHg (12/13 1455) SpO2:  [95 %-97 %] 96 % (12/13 1504) Weight:  [71.124 kg (156 lb 12.8 oz)] 71.124 kg (156 lb 12.8 oz) (12/13 0435)  General: More alert and interacting with family Lungs: Right lung base clear; persistent left base crackles posteriorly Cor: Regular S1 and S2 with a 1/6 systolic murmur  Lab Results Lab Results  Component Value Date   WBC 8.7 12/26/2011   HGB 11.5* 12/26/2011   HCT 33.2* 12/26/2011   MCV 85.1 12/26/2011   PLT 163 12/26/2011    Lab Results  Component Value Date   CREATININE 2.03* 12/29/2011   BUN 38* 12/29/2011   NA 129* 12/29/2011   K 4.1 12/29/2011   CL 95* 12/29/2011   CO2 24 12/29/2011    Microbiology: Recent Results (from the past 240 hour(s))  CULTURE, BLOOD (ROUTINE X 2)     Status: Normal (Preliminary result)   Collection Time   12/25/11  2:10 AM      Component Value Range Status Comment   Specimen Description BLOOD BLOOD RIGHT FOREARM   Final    Special Requests BOTTLES DRAWN AEROBIC AND ANAEROBIC 2 CC EACH   Final    Culture  Setup Time 12/25/2011 10:04   Final    Culture     Final    Value:        BLOOD CULTURE RECEIVED NO GROWTH TO DATE CULTURE WILL BE HELD FOR 5 DAYS BEFORE ISSUING A FINAL NEGATIVE  REPORT   Report Status PENDING   Incomplete   CULTURE, BLOOD (ROUTINE X 2)     Status: Normal   Collection Time   12/25/11  2:10 AM      Component Value Range Status Comment   Specimen Description BLOOD RIGHT ANTECUBITAL   Final    Special Requests BOTTLES DRAWN AEROBIC AND ANAEROBIC 5 CC EACH   Final    Culture  Setup Time 12/25/2011 10:04   Final    Culture     Final    Value: STAPHYLOCOCCUS SPECIES (COAGULASE NEGATIVE)     Note: RIFAMPIN AND GENTAMICIN SHOULD NOT BE USED AS SINGLE DRUGS FOR TREATMENT OF STAPH INFECTIONS.     Note: Gram Stain Report Called to,Read Back By and Verified With: AMANDA THOMPSON 12/26/11 0830 BY SMITHERSJ   Report Status 12/29/2011 FINAL   Final    Organism ID, Bacteria STAPHYLOCOCCUS SPECIES (COAGULASE NEGATIVE)   Final   CULTURE, BLOOD (ROUTINE X 2)     Status: Normal (Preliminary result)   Collection Time   12/26/11  9:50 AM      Component Value Range Status Comment   Specimen Description BLOOD RIGHT  ARM   Final    Special Requests BOTTLES DRAWN AEROBIC AND ANAEROBIC 5CC   Final    Culture  Setup Time 12/26/2011 20:54   Final    Culture     Final    Value:        BLOOD CULTURE RECEIVED NO GROWTH TO DATE CULTURE WILL BE HELD FOR 5 DAYS BEFORE ISSUING A FINAL NEGATIVE REPORT   Report Status PENDING   Incomplete   CULTURE, BLOOD (ROUTINE X 2)     Status: Normal (Preliminary result)   Collection Time   12/26/11  9:55 AM      Component Value Range Status Comment   Specimen Description BLOOD RIGHT HAND   Final    Special Requests BOTTLES DRAWN AEROBIC ONLY 5CC   Final    Culture  Setup Time 12/26/2011 20:54   Final    Culture     Final    Value:        BLOOD CULTURE RECEIVED NO GROWTH TO DATE CULTURE WILL BE HELD FOR 5 DAYS BEFORE ISSUING A FINAL NEGATIVE REPORT   Report Status PENDING   Incomplete   CULTURE, EXPECTORATED SPUTUM-ASSESSMENT     Status: Normal   Collection Time   12/27/11  6:41 PM      Component Value Range Status Comment   Specimen  Description SPUTUM   Final    Special Requests NONE   Final    Sputum evaluation     Final    Value: THIS SPECIMEN IS ACCEPTABLE. RESPIRATORY CULTURE REPORT TO FOLLOW.   Report Status 12/27/2011 FINAL   Final   CULTURE, RESPIRATORY     Status: Normal (Preliminary result)   Collection Time   12/27/11  6:41 PM      Component Value Range Status Comment   Specimen Description SPUTUM   Final    Special Requests NONE   Final    Gram Stain     Final    Value: MODERATE WBC PRESENT,BOTH PMN AND MONONUCLEAR     RARE SQUAMOUS EPITHELIAL CELLS PRESENT     NO ORGANISMS SEEN   Culture NORMAL OROPHARYNGEAL FLORA   Final    Report Status PENDING   Incomplete    Assessment: He is improving slowly on empiric therapy for community-acquired pneumonia. With his creatinine continued to rise I agree with continuing use of linezolid rather than IV vancomycin. He 1 blood cultures probably an insignificant contaminant and sputum cultures grew normal oral flora so we will be left with empiric therapy for the duration of his treatment.  Plan: 1. Continue current antibiotics 2. I will followup on Monday, December 16  Michel Bickers, MD [facility] for [place] [phone] pager   [phone] cell 12/29/2011, 3:11 PM

## 2011-12-29 NOTE — Progress Notes (Signed)
Speech Language Pathology Dysphagia Treatment Patient Details Name: Alexander Mcdonald MRN: [id number] DOB: [birth year] Today's Date: 12/29/2011 Time: 0850-0900 SLP Time Calculation (min): 10 min  Assessment / Plan / Recommendation Clinical Impression  Pt had BSE yesterday. Suspicion for pharyngeal and/or esophageal dysphagia raised - MBS recommended. SLP spoke with pt briefly to assess continued appropriateness for MBS today. No order yet received from MD. SLP paged MD, notified RN. Awaiting direction.    Diet Recommendation  Continue with Current Diet: Other (comment) (per BSE)    SLP Plan Other (Comment) (MBS recommended.)   Pertinent Vitals/Pain None indicated    General Temperature Spikes Noted: Yes Respiratory Status: Supplemental O2 delivered via (comment) ([name] ) Behavior/Cognition: Alert;Cooperative Patient Positioning: Partially reclined   Dysphagia Treatment Treatment focused on: Patient/family/caregiver education (Determining pt appropriateness for MBS) Patient observed directly with PO's: No Reason PO's not observed: Other (comment) (? silent aspiration - MBS recommended)   Alexander Mcdonald B. Alexander Mcdonald, [facility], [name]  Alexander Mcdonald 12/29/2011, 10:48 AM

## 2011-12-29 NOTE — Progress Notes (Signed)
Physical Therapy Treatment Patient Details Name: Alexander Mcdonald MRN: [id number] DOB: 1926/07/31 Today's Date: 12/29/2011 Time: [id number] PT Time Calculation (min): 32 min  PT Assessment / Plan / Recommendation Comments on Treatment Session  Pt with better cognition this visit however still limited by dyspnea and fatigues quickly. Pt remained on 2L oxygen and sats around 94% throughout tx.    Follow Up Recommendations  SNF     Does the patient have the potential to tolerate intense rehabilitation     Barriers to Discharge        Equipment Recommendations  Rolling walker with 5" wheels    Recommendations for Other Services    Frequency     Plan Discharge plan remains appropriate;Frequency remains appropriate    Precautions / Restrictions Precautions Precautions: Fall Precaution Comments: sats dropped 12/10 after in recliner , keep on O2   Pertinent Vitals/Pain No pain, SaO2 94% 2L O2    Mobility  Bed Mobility Bed Mobility: Supine to Sit;Sit to Supine Supine to Sit: 2: Max assist Sitting - Scoot to [employer] of Bed: 4: Min assist Sit to Supine: 1: +2 Total assist Sit to Supine: Patient Percentage: 20% Details for Bed Mobility Assistance: verbal cues for technique, increased time, most assist for trunk, increased assist to return to bed due to fatigue Transfers Transfers: Stand to Sit;Sit to Stand Sit to Stand: 4: Min assist;From bed;From chair/3-in-1 Stand to Sit: 4: Min assist;To chair/3-in-1;To bed Stand Pivot Transfers: 4: Min guard Details for Transfer Assistance: verbal cues for hand placement, used RW to get to [facility], pt began BM upon standing so assisted to [facility], short rest breaks between transfers required for SOB and fatigue Ambulation/Gait Ambulation/Gait Assistance: 4: Min assist Ambulation Distance (Feet): 14 Feet Assistive device: Rolling walker Ambulation/Gait Assistance Details: verbal cues for staying inside RW, RW distance, turning, tends to have increased  forward flexion, pt fatigues quickly, reports dyspnea, ambulated on 2L O2 [place] Gait Pattern: Decreased stride length;Shuffle;Trunk flexed Gait velocity: decreased    Exercises     PT Diagnosis:    PT Problem List:   PT Treatment Interventions:     PT Goals Acute Rehab PT Goals PT Goal: Supine/Side to Sit - Progress: Progressing toward goal PT Goal: Sit to Supine/Side - Progress: Progressing toward goal PT Goal: Sit to Stand - Progress: Progressing toward goal PT Goal: Stand to Sit - Progress: Progressing toward goal PT Goal: Ambulate - Progress: Progressing toward goal  Visit Information  Last PT Received On: 12/29/11 Assistance Needed: +2    Subjective Data  Subjective: i'm not doing as well today.   Cognition  Overall Cognitive Status: Appears within functional limits for tasks assessed/performed Following Commands: Follows one step commands consistently Cognition - Other Comments: Pt tired today but able to follow commands and appropriate during session    Balance     End of Session PT - End of Session Equipment Utilized During Treatment: Oxygen Activity Tolerance: Patient limited by fatigue Patient left: in bed;with call bell/phone within reach   GP     [facility] E 12/29/2011, 12:23 PM Pager: [phone]

## 2011-12-29 NOTE — Progress Notes (Signed)
[facility] PROGRESS NOTE  Alexander Mcdonald [id number] DOB: [birth year] DOA: 12/25/2011 PCP: [name], MD  Assessment/Plan: Bacterial CAP versus aspiration pneumonia. Thick green sputum, frequent desaturations  - cont pip/tazo and linezolid.  - Strep pneumo and legionella ag ng  - Sputum induction ordered and speech eval appreciated. MBS ordered  Coag neg staph bacteremia, may be contaminant in the setting of pneumonia. ID note reviewed. Will d/c vanc for now. Creat 2.08 today. Repeat BCx on 12/10 ng. TTE with EF 55 to 60% and no evidence of infection. Follow fever, wbc and clinical improvement.   Weakness, generalized: Secondary to above, consider SNF. Will push some IV fluids gently.   Fever:  Resolved in last 36 hours. .   Urinary retention : not passing urine well. We will get bladder scan and start I/O foley if he has >300 cc urine collected. He has condom cath. Avoid foley as possible.   HTN (hypertension): One reading of 163/74 noted.   Rhabdomyolysis very mild: Hold statin for now but should be able to resume as outpatient.   Hyponatremia: NA 131, will monitor   Acute on chronic kidney disease: creat 1.45 today. May be baseline. But wound avoid use of vancomycin on him. May have been prerenal from acute illness.   DIET: Healthy heart  ACCESS: PIV  IVF: None  PROPH: SCDs  Code Status: Full code  Family Communication: Discussed plan with daughter today  Disposition Plan: Pending further evaluation for endocarditis to determine antibiotic course.   Consultants:  - Infectious disease  Procedures:  2-D echo  Antibiotics:  IV vancomycin started 12/10: Day 3  PIP/tazo Linezolid since 12/12  HPI/Subjective: No fever noted. No worsening SOB or CP noted. Still not motivated to eat today.   Objective: Filed Vitals:   12/28/11 1913 12/28/11 2215 12/29/11 0435 12/29/11 0922  BP:  135/58 134/58   Pulse:  83 74   Temp:  98.1 F (36.7 C) 99.7 F (37.6  C)   TempSrc:  Oral Axillary   Resp:  18 20   Height:      Weight:   71.124 kg (156 lb 12.8 oz)   SpO2: 96% 96% 97% 96%    Intake/Output Summary (Last 24 hours) at 12/29/11 1408 Last data filed at 12/29/11 0955  Gross per 24 hour  Intake    940 ml  Output   1195 ml  Net   -255 ml   Filed Weights   12/27/11 0427 12/28/11 0522 12/29/11 0435  Weight: 71.8 kg (158 lb 4.6 oz) 70 kg (154 lb 5.2 oz) 71.124 kg (156 lb 12.8 oz)    Exam: General: NAD, lying in bed Cardiovascular: RRR, no m/r/g Respiratory: GBAE, rhonchi Abdomen: Soft, NT, ND, BS+ Extremities: Trace edema   Data Reviewed: Basic Metabolic Panel:  Lab [id number] 12/26/11 0519 12/25/11 0900 12/25/11 0210  NA 129* 131* 134* 130*  K 4.1 3.5 3.8 3.7  CL 95* 97 102 96  CO2 24 21 22 20   GLUCOSE 111* 111* 108* 139*  BUN 38* 18 22 26*  CREATININE 2.03* 1.45* 1.72* 2.01*  CALCIUM 8.5 8.6 8.4 8.9  MG -- -- -- --  PHOS -- -- -- --   Liver Function Tests:  Lab 12/25/11 0210  AST 70*  ALT 50  ALKPHOS 99  BILITOT 1.9*  PROT 7.0  ALBUMIN 3.2*   No results found for this basename: LIPASE:5,AMYLASE:5 in the last 168 hours No results found for this basename: AMMONIA:5 in the  last 168 hours CBC:  Lab 12/26/11 0519 12/25/11 0900 12/25/11 0520 12/25/11 0210  WBC 8.7 8.9 9.3 16.1*  NEUTROABS -- 6.8 7.3 --  HGB 11.5* 11.7* 11.0* 5.2*  HCT 33.2* 33.8* 31.1* 14.6*  MCV 85.1 85.6 84.3 85.4  PLT 163 148* 148* 260   Cardiac Enzymes:  Lab 12/26/11 0519 12/25/11 0900 12/25/11 0520 12/25/11 0210  CKTOTAL 561* 1043* -- 272*  CKMB -- -- -- --  CKMBINDEX -- -- -- --  TROPONINI -- <0.30 <0.30 --   BNP (last 3 results) No results found for this basename: PROBNP:3 in the last 8760 hours CBG:  Lab 12/25/11 0148  GLUCAP 140*    Recent Results (from the past 240 hour(s))  CULTURE, BLOOD (ROUTINE X 2)     Status: Normal (Preliminary result)   Collection Time   12/25/11  2:10 AM      Component Value Range Status  Comment   Specimen Description BLOOD BLOOD RIGHT FOREARM   Final    Special Requests BOTTLES DRAWN AEROBIC AND ANAEROBIC 2 CC EACH   Final    Culture  Setup Time 12/25/2011 10:04   Final    Culture     Final    Value:        BLOOD CULTURE RECEIVED NO GROWTH TO DATE CULTURE WILL BE HELD FOR 5 DAYS BEFORE ISSUING A FINAL NEGATIVE REPORT   Report Status PENDING   Incomplete   CULTURE, BLOOD (ROUTINE X 2)     Status: Normal   Collection Time   12/25/11  2:10 AM      Component Value Range Status Comment   Specimen Description BLOOD RIGHT ANTECUBITAL   Final    Special Requests BOTTLES DRAWN AEROBIC AND ANAEROBIC 5 CC EACH   Final    Culture  Setup Time 12/25/2011 10:04   Final    Culture     Final    Value: STAPHYLOCOCCUS SPECIES (COAGULASE NEGATIVE)     Note: THE SIGNIFICANCE OF ISOLATING THIS ORGANISM FROM A SINGLE SET OF BLOOD CULTURES WHEN MULTIPLE SETS ARE DRAWN IS UNCERTAIN. PLEASE NOTIFY THE MICROBIOLOGY DEPARTMENT WITHIN ONE WEEK IF SPECIATION AND SENSITIVITIES ARE REQUIRED.     Note: Gram Stain Report Called to,Read Back By and Verified With: AMANDA THOMPSON 12/26/11 0830 BY SMITHERSJ   Report Status 12/27/2011 FINAL   Final    Organism ID, Bacteria STAPHYLOCOCCUS SPECIES (COAGULASE NEGATIVE)   Final   CULTURE, BLOOD (ROUTINE X 2)     Status: Normal (Preliminary result)   Collection Time   12/26/11  9:50 AM      Component Value Range Status Comment   Specimen Description BLOOD RIGHT ARM   Final    Special Requests BOTTLES DRAWN AEROBIC AND ANAEROBIC 5CC   Final    Culture  Setup Time 12/26/2011 20:54   Final    Culture     Final    Value:        BLOOD CULTURE RECEIVED NO GROWTH TO DATE CULTURE WILL BE HELD FOR 5 DAYS BEFORE ISSUING A FINAL NEGATIVE REPORT   Report Status PENDING   Incomplete   CULTURE, BLOOD (ROUTINE X 2)     Status: Normal (Preliminary result)   Collection Time   12/26/11  9:55 AM      Component Value Range Status Comment   Specimen Description BLOOD RIGHT HAND    Final    Special Requests BOTTLES DRAWN AEROBIC ONLY 5CC   Final    Culture  Setup  Time 12/26/2011 20:54   Final    Culture     Final    Value:        BLOOD CULTURE RECEIVED NO GROWTH TO DATE CULTURE WILL BE HELD FOR 5 DAYS BEFORE ISSUING A FINAL NEGATIVE REPORT   Report Status PENDING   Incomplete   CULTURE, EXPECTORATED SPUTUM-ASSESSMENT     Status: Normal   Collection Time   12/27/11  6:41 PM      Component Value Range Status Comment   Specimen Description SPUTUM   Final    Special Requests NONE   Final    Sputum evaluation     Final    Value: THIS SPECIMEN IS ACCEPTABLE. RESPIRATORY CULTURE REPORT TO FOLLOW.   Report Status 12/27/2011 FINAL   Final   CULTURE, RESPIRATORY     Status: Normal (Preliminary result)   Collection Time   12/27/11  6:41 PM      Component Value Range Status Comment   Specimen Description SPUTUM   Final    Special Requests NONE   Final    Gram Stain     Final    Value: MODERATE WBC PRESENT,BOTH PMN AND MONONUCLEAR     RARE SQUAMOUS EPITHELIAL CELLS PRESENT     NO ORGANISMS SEEN   Culture NORMAL OROPHARYNGEAL FLORA   Final    Report Status PENDING   Incomplete      Studies: No results found.  Scheduled Meds:   . amLODipine  5 mg Oral Daily  . docusate sodium  100 mg Oral BID  . levalbuterol  0.63 mg Nebulization TID  . linezolid  600 mg Oral Q12H  . lisinopril  20 mg Oral Daily  . omega-3 acid ethyl esters  1 g Oral Daily  . pantoprazole  40 mg Oral Daily  . piperacillin-tazobactam (ZOSYN)  IV  3.375 g Intravenous Q8H  . polyethylene glycol  17 g Oral Daily  . sodium chloride  3 mL Intravenous Q12H  . Tamsulosin HCl  0.4 mg Oral Daily  . timolol  1 drop Both Eyes BID   Continuous Infusions:   Principal Problem:  *Pneumonia Active Problems:  Weakness  Fever  Renal failure (ARF), acute on chronic  HTN (hypertension)  Rhabdomyolysis  SIRS (systemic inflammatory response syndrome)  Time spent: 30 min  Sherlie Boyum V.  [facility] Pager [phone]. If 8PM-8AM, please contact night-coverage at [email or web address], password [facility] 12/29/2011, 2:08 PM  LOS: 4 days

## 2011-12-30 ENCOUNTER — Inpatient Hospital Stay (HOSPITAL_COMMUNITY): Payer: Medicare Other

## 2011-12-30 DIAGNOSIS — R5381 Other malaise: Secondary | ICD-10-CM | POA: Diagnosis not present

## 2011-12-30 DIAGNOSIS — N189 Chronic kidney disease, unspecified: Secondary | ICD-10-CM | POA: Diagnosis not present

## 2011-12-30 DIAGNOSIS — M6282 Rhabdomyolysis: Secondary | ICD-10-CM | POA: Diagnosis not present

## 2011-12-30 DIAGNOSIS — J189 Pneumonia, unspecified organism: Secondary | ICD-10-CM | POA: Diagnosis not present

## 2011-12-30 LAB — BASIC METABOLIC PANEL
BUN: 35 mg/dL — ABNORMAL HIGH (ref 6–23)
CO2: 23 mEq/L (ref 19–32)
Calcium: 8.6 mg/dL (ref 8.4–10.5)
Chloride: 97 mEq/L (ref 96–112)
Creatinine, Ser: 1.63 mg/dL — ABNORMAL HIGH (ref 0.50–1.35)
GFR calc Af Amer: 43 mL/min — ABNORMAL LOW (ref >90)
GFR calc non Af Amer: 37 mL/min — ABNORMAL LOW (ref >90)
Glucose, Bld: 97 mg/dL (ref 70–99)
Potassium: 4 mEq/L (ref 3.5–5.1)
Sodium: 131 mEq/L — ABNORMAL LOW (ref 135–145)

## 2011-12-30 LAB — CULTURE, RESPIRATORY W GRAM STAIN: Culture: NORMAL

## 2011-12-30 LAB — CULTURE, RESPIRATORY

## 2011-12-30 NOTE — Procedures (Signed)
Objective Swallowing Evaluation: Modified Barium Swallowing Study  Patient Details  Name: Alexander Mcdonald MRN: [id number] Date of Birth: [birth year]  Today's Date: 12/30/2011 Time: 0825-0851 SLP Time Calculation (min): 26 min  Past Medical History:  Past Medical History  Diagnosis Date  . ACUTE RENAL FAILURE W/LESION OF TUBULAR NECROSIS 01/04/2007  . CAROTID ARTERY DISEASE 06/01/2009  . DEPRESSION 07/25/2006  . DIVERTICULOSIS OF COLON 02/01/2007  . GOUT 07/25/2006  . HEMORRHOID, THROMBOSED 02/15/2009  . HYPERCHOLESTEROLEMIA 07/25/2006  . HYPERTENSION 07/25/2006  . PROSTATE CANCER, HX OF 07/25/2006  . Rosacea 09/28/2008  . WEIGHT LOSS 09/01/2009  . Glaucoma(365)    Past Surgical History:  Past Surgical History  Procedure Date  . Carotid endarterectomy   . Cataract extraction    HPI:  78 year old male with history of hypertension, chronic kidney disease, gout, carotid artery disease and prostate cancer was brought to the ER patient was found to be feeling weak. As per patient's wife patient has been feeling weak for last 2 days and complaining of generalized body ache. Last evening he felt very weak to walk and he was brought to the ER. In the ER he was found to be febrile with temperatures around 102F. Chest x-ray and UA were unremarkable. EKG showed sinus rhythm with PVCs. Patient temperature improved with Tylenol. Patient otherwise denies any chest pain shortness of breath nausea vomiting or diarrhea. Patient's initial hemoglobin was on 5 and repeat was 11. At this time patient has been admitted for further management. Bedside evaluation indicated possible silent aspiration or esophageal dysphagia. MBS warranted to determine safety with PO.      Assessment / Plan / Recommendation Clinical Impression  Dysphagia Diagnosis: Mild oral phase dysphagia;Mild pharyngeal phase dysphagia Clinical impression: Pt presents with a mild oral and oropharyngeal dysphagia with generalized motor weakness  throughout structures. Pt with mild oral residuals particularly with liquids due to decreased lingual propulsion. Weakness throughout the hyolaryngeal complex also noted with mild vallecular and pharyngeal residuals present, again particularly with thin liquids. Heavier solid boluses are more easily transited. Attempted chin tuck without improvement. Double/triple swallows clear most oral and oropharyngeal residual, also following liquids with a heavy solid bolus improves transit. Question general decompensation vs possibly hypertensive UES as the cause of residual. UES could not be visualized due to pts shoulder despite repositioning efforts though no backflow or residue observed. Esophageal sweep WNL. Theer was no penetration or aspriation observed during the study though penetration of residual likely. Pt did demonstrate some hard coughing when study was complete, expectorated clear secretions with no evidence of barium. Recommend pt continue a regular diet with thin liquids with compensatory strategies to reduce mild risk of aspiration. SLP will f/u in acute setting, recommend SLP at next level of care for possible strengthening exercises.     Treatment Recommendation  Therapy as outlined in treatment plan below    Diet Recommendation Regular;Thin liquid   Liquid Administration via: Cup;Straw Medication Administration: Whole meds with liquid Supervision: Patient able to self feed;Full supervision/cueing for compensatory strategies Compensations: Slow rate;Small sips/bites;Multiple dry swallows after each bite/sip;Effortful swallow (follow liquids with solids when possible) Postural Changes and/or Swallow Maneuvers: Seated upright 90 degrees;Upright 30-60 min after meal    Other  Recommendations Oral Care Recommendations: Oral care BID Other Recommendations: Have oral suction available;Clarify dietary restrictions   Follow Up Recommendations  Skilled Nursing facility    Frequency and Duration  min 2x/week  2 weeks   Pertinent Vitals/Pain NA    SLP Swallow  Goals Patient will consume recommended diet without observed clinical signs of aspiration with: Supervision/safety Patient will utilize recommended strategies during swallow to increase swallowing safety with: Supervision/safety Goal #3: Pt will complete pharyngeal strengthening exercises x10 (or more if tolerated) with min verbal cues.    General HPI: 78 year old male with history of hypertension, chronic kidney disease, gout, carotid artery disease and prostate cancer was brought to the ER patient was found to be feeling weak. As per patient's wife patient has been feeling weak for last 2 days and complaining of generalized body ache. Last evening he felt very weak to walk and he was brought to the ER. In the ER he was found to be febrile with temperatures around 102F. Chest x-ray and UA were unremarkable. EKG showed sinus rhythm with PVCs. Patient temperature improved with Tylenol. Patient otherwise denies any chest pain shortness of breath nausea vomiting or diarrhea. Patient's initial hemoglobin was on 5 and repeat was 11. At this time patient has been admitted for further management. Bedside evaluation indicated possible silent aspiration or esophageal dysphagia. MBS warranted to determine safety with PO.  Type of Study: Modified Barium Swallowing Study Reason for Referral: Objectively evaluate swallowing function Previous Swallow Assessment: BSE 12/28/11 Diet Prior to this Study: Regular;Thin liquids Temperature Spikes Noted: No Respiratory Status: Supplemental O2 delivered via (comment) History of Recent Intubation: No Behavior/Cognition: Alert;Cooperative Oral Cavity - Dentition: Adequate natural dentition Oral Motor / Sensory Function: Within functional limits Self-Feeding Abilities: Able to feed self Patient Positioning: Upright in chair Baseline Vocal Quality: Breathy;Low vocal intensity Volitional Cough:  Weak Anatomy: Within functional limits Pharyngeal Secretions: Not observed secondary MBS    Reason for Referral Objectively evaluate swallowing function   Oral Phase Oral Preparation/Oral Phase Oral Phase: Impaired Oral - Thin Oral - Thin Cup: Lingual/palatal residue;Reduced posterior propulsion Oral - Thin Straw: Lingual/palatal residue Oral - Solids Oral - Puree: Within functional limits Oral - Regular: Lingual/palatal residue;Reduced posterior propulsion Oral - Pill: Lingual/palatal residue;Reduced posterior propulsion   Pharyngeal Phase Pharyngeal Phase Pharyngeal Phase: Impaired Pharyngeal - Thin Pharyngeal - Thin Cup: Reduced anterior laryngeal mobility;Reduced laryngeal elevation;Reduced airway/laryngeal closure;Reduced tongue base retraction;Reduced epiglottic inversion;Reduced pharyngeal peristalsis;Pharyngeal residue - valleculae;Pharyngeal residue - pyriform sinuses Pharyngeal - Thin Straw: Reduced anterior laryngeal mobility;Reduced laryngeal elevation;Reduced airway/laryngeal closure;Reduced tongue base retraction;Reduced epiglottic inversion;Reduced pharyngeal peristalsis;Pharyngeal residue - valleculae;Pharyngeal residue - pyriform sinuses Pharyngeal - Solids Pharyngeal - Puree: Reduced anterior laryngeal mobility;Reduced laryngeal elevation;Reduced airway/laryngeal closure;Reduced tongue base retraction;Reduced epiglottic inversion;Reduced pharyngeal peristalsis Pharyngeal - Regular: Reduced anterior laryngeal mobility;Reduced laryngeal elevation;Reduced airway/laryngeal closure;Reduced tongue base retraction;Reduced epiglottic inversion;Reduced pharyngeal peristalsis Pharyngeal - Pill: Reduced anterior laryngeal mobility;Reduced laryngeal elevation;Reduced airway/laryngeal closure;Reduced tongue base retraction;Reduced epiglottic inversion;Reduced pharyngeal peristalsis  Cervical Esophageal Phase    GO    Cervical Esophageal Phase Cervical Esophageal Phase:  [facility]          Alexander Mcdonald, Katherene Ponto 12/30/2011, 9:14 AM

## 2011-12-30 NOTE — Progress Notes (Signed)
[facility] PROGRESS NOTE  Alexander Mcdonald [id number] DOB: 07-Dec-1926 DOA: 12/25/2011 PCP: [name], MD  Assessment/Plan: Bacterial CAP versus aspiration pneumonia. Thick green sputum, frequent desaturations  - cont pip/tazo and linezolid.  - Strep pneumo and legionella ag ng  - Sputum cx pending. MBS showed dysphagia and recommended regular, thin liquid regular diet - will check pulse ox on ambulation to document any hypoxia  Coag neg staph bacteremia, may be contaminant in the setting of pneumonia. ID note reviewed. Will d/c vanc for now. Repeat BCx on 12/10 ng. TTE with EF 55 to 60% and no evidence of infection. Follow fever, wbc and clinical improvement.   Weakness, generalized: Secondary to above, consider SNF. Status post IVF  Fever: Resolved in last 36 hours.    Urinary retention : not passing urine well. We will get bladder scan and start I/O foley if he has >300 cc urine collected. He has condom cath. Avoid foley as possible.   HTN (hypertension): will monitor   Rhabdomyolysis very mild: Hold statin for now but should be able to resume as outpatient.   Hyponatremia: will monitor   Acute on chronic kidney disease: May have been prerenal from acute illness.   DIET: Healthy heart  ACCESS: PIV  IVF: None  PROPH: SCDs   Code Status: Full code  Family Communication: Discussed plan with daughter today  Disposition Plan: [facility]  Consultants:  - Infectious disease  Procedures:  2-D echo  Antibiotics:  IV vancomycin started 12/10: Day 3  PIP/tazo  Linezolid since 12/12  HPI/Subjective: No fever noted. Oral intake marginal. No worsening resp status noted.   Objective: Filed Vitals:   12/29/11 1504 12/29/11 2108 12/29/11 2150 12/30/11 0508  BP:   130/52 131/58  Pulse:   72 66  Temp:   97.6 F (36.4 C) 97.7 F (36.5 C)  TempSrc:   Oral Oral  Resp:   18 20  Height:      Weight:      SpO2: 96% 95% 98% 99%    Intake/Output Summary (Last 24  hours) at 12/30/11 1038 Last data filed at 12/30/11 0541  Gross per 24 hour  Intake    570 ml  Output    950 ml  Net   -380 ml   Filed Weights   12/27/11 0427 12/28/11 0522 12/29/11 0435  Weight: 71.8 kg (158 lb 4.6 oz) 70 kg (154 lb 5.2 oz) 71.124 kg (156 lb 12.8 oz)    Exam: General: NAD, sitting up in chair,  Cardiovascular: RRR, no m/r/g  Respiratory: GBAE, rhonchi  Abdomen: Soft, NT, ND, BS+  Extremities: Trace edema  Data Reviewed: Basic Metabolic Panel:  Lab [id number] 12/29/11 0450 12/26/11 0519 12/25/11 0900 12/25/11 0210  NA 131* 129* 131* 134* 130*  K 4.0 4.1 3.5 3.8 3.7  CL 97 95* 97 102 96  CO2 23 24 21 22 20   GLUCOSE 97 111* 111* 108* 139*  BUN 35* 38* 18 22 26*  CREATININE 1.63* 2.03* 1.45* 1.72* 2.01*  CALCIUM 8.6 8.5 8.6 8.4 8.9  MG -- -- -- -- --  PHOS -- -- -- -- --   Liver Function Tests:  Lab 12/25/11 0210  AST 70*  ALT 50  ALKPHOS 99  BILITOT 1.9*  PROT 7.0  ALBUMIN 3.2*   No results found for this basename: LIPASE:5,AMYLASE:5 in the last 168 hours No results found for this basename: AMMONIA:5 in the last 168 hours CBC:  Lab 12/26/11 0519 12/25/11 0900 12/25/11  0520 12/25/11 0210  WBC 8.7 8.9 9.3 16.1*  NEUTROABS -- 6.8 7.3 --  HGB 11.5* 11.7* 11.0* 5.2*  HCT 33.2* 33.8* 31.1* 14.6*  MCV 85.1 85.6 84.3 85.4  PLT 163 148* 148* 260   Cardiac Enzymes:  Lab 12/26/11 0519 12/25/11 0900 12/25/11 0520 12/25/11 0210  CKTOTAL 561* 1043* -- 272*  CKMB -- -- -- --  CKMBINDEX -- -- -- --  TROPONINI -- <0.30 <0.30 --   BNP (last 3 results) No results found for this basename: PROBNP:3 in the last 8760 hours CBG:  Lab 12/25/11 0148  GLUCAP 140*    Recent Results (from the past 240 hour(s))  CULTURE, BLOOD (ROUTINE X 2)     Status: Normal (Preliminary result)   Collection Time   12/25/11  2:10 AM      Component Value Range Status Comment   Specimen Description BLOOD BLOOD RIGHT FOREARM   Final    Special Requests BOTTLES DRAWN  AEROBIC AND ANAEROBIC 2 CC EACH   Final    Culture  Setup Time 12/25/2011 10:04   Final    Culture     Final    Value:        BLOOD CULTURE RECEIVED NO GROWTH TO DATE CULTURE WILL BE HELD FOR 5 DAYS BEFORE ISSUING A FINAL NEGATIVE REPORT   Report Status PENDING   Incomplete   CULTURE, BLOOD (ROUTINE X 2)     Status: Normal   Collection Time   12/25/11  2:10 AM      Component Value Range Status Comment   Specimen Description BLOOD RIGHT ANTECUBITAL   Final    Special Requests BOTTLES DRAWN AEROBIC AND ANAEROBIC 5 CC EACH   Final    Culture  Setup Time 12/25/2011 10:04   Final    Culture     Final    Value: STAPHYLOCOCCUS SPECIES (COAGULASE NEGATIVE)     Note: RIFAMPIN AND GENTAMICIN SHOULD NOT BE USED AS SINGLE DRUGS FOR TREATMENT OF STAPH INFECTIONS.     Note: Gram Stain Report Called to,Read Back By and Verified With: AMANDA THOMPSON 12/26/11 0830 BY SMITHERSJ   Report Status 12/29/2011 FINAL   Final    Organism ID, Bacteria STAPHYLOCOCCUS SPECIES (COAGULASE NEGATIVE)   Final   CULTURE, BLOOD (ROUTINE X 2)     Status: Normal (Preliminary result)   Collection Time   12/26/11  9:50 AM      Component Value Range Status Comment   Specimen Description BLOOD RIGHT ARM   Final    Special Requests BOTTLES DRAWN AEROBIC AND ANAEROBIC 5CC   Final    Culture  Setup Time 12/26/2011 20:54   Final    Culture     Final    Value:        BLOOD CULTURE RECEIVED NO GROWTH TO DATE CULTURE WILL BE HELD FOR 5 DAYS BEFORE ISSUING A FINAL NEGATIVE REPORT   Report Status PENDING   Incomplete   CULTURE, BLOOD (ROUTINE X 2)     Status: Normal (Preliminary result)   Collection Time   12/26/11  9:55 AM      Component Value Range Status Comment   Specimen Description BLOOD RIGHT HAND   Final    Special Requests BOTTLES DRAWN AEROBIC ONLY 5CC   Final    Culture  Setup Time 12/26/2011 20:54   Final    Culture     Final    Value:        BLOOD CULTURE RECEIVED NO GROWTH  TO DATE CULTURE WILL BE HELD FOR 5 DAYS  BEFORE ISSUING A FINAL NEGATIVE REPORT   Report Status PENDING   Incomplete   CULTURE, EXPECTORATED SPUTUM-ASSESSMENT     Status: Normal   Collection Time   12/27/11  6:41 PM      Component Value Range Status Comment   Specimen Description SPUTUM   Final    Special Requests NONE   Final    Sputum evaluation     Final    Value: THIS SPECIMEN IS ACCEPTABLE. RESPIRATORY CULTURE REPORT TO FOLLOW.   Report Status 12/27/2011 FINAL   Final   CULTURE, RESPIRATORY     Status: Normal (Preliminary result)   Collection Time   12/27/11  6:41 PM      Component Value Range Status Comment   Specimen Description SPUTUM   Final    Special Requests NONE   Final    Gram Stain     Final    Value: MODERATE WBC PRESENT,BOTH PMN AND MONONUCLEAR     RARE SQUAMOUS EPITHELIAL CELLS PRESENT     NO ORGANISMS SEEN   Culture NORMAL OROPHARYNGEAL FLORA   Final    Report Status PENDING   Incomplete      Studies: Dg Swallowing Func-speech Pathology  12/30/2011  Katherene Ponto Deblois, CCC-SLP     12/30/2011  9:16 AM Objective Swallowing Evaluation: Modified Barium Swallowing Study   Patient Details  Name: Alexander Mcdonald MRN: [id number] Date of Birth: 1926-12-19  Today's Date: 12/30/2011 Time: 0825-0851 SLP Time Calculation (min): 26 min  Past Medical History:  Past Medical History  Diagnosis Date  . ACUTE RENAL FAILURE W/LESION OF TUBULAR NECROSIS 01/04/2007  . CAROTID ARTERY DISEASE 06/01/2009  . DEPRESSION 07/25/2006  . DIVERTICULOSIS OF COLON 02/01/2007  . GOUT 07/25/2006  . HEMORRHOID, THROMBOSED 02/15/2009  . HYPERCHOLESTEROLEMIA 07/25/2006  . HYPERTENSION 07/25/2006  . PROSTATE CANCER, HX OF 07/25/2006  . Rosacea 09/28/2008  . WEIGHT LOSS 09/01/2009  . Glaucoma(365)    Past Surgical History:  Past Surgical History  Procedure Date  . Carotid endarterectomy   . Cataract extraction    HPI:  78 year old male with history of hypertension, chronic kidney  disease, gout, carotid artery disease and prostate cancer was  brought to the ER  patient was found to be feeling weak. As per  patient's wife patient has been feeling weak for last 2 days and  complaining of generalized body ache. Last evening he felt very  weak to walk and he was brought to the ER. In the ER he was found  to be febrile with temperatures around 102F. Chest x-ray and UA  were unremarkable. EKG showed sinus rhythm with PVCs. Patient  temperature improved with Tylenol. Patient otherwise denies any  chest pain shortness of breath nausea vomiting or diarrhea.  Patient's initial hemoglobin was on 5 and repeat was 11. At this  time patient has been admitted for further management. Bedside  evaluation indicated possible silent aspiration or esophageal  dysphagia. MBS warranted to determine safety with PO.      Assessment / Plan / Recommendation Clinical Impression  Dysphagia Diagnosis: Mild oral phase dysphagia;Mild pharyngeal  phase dysphagia Clinical impression: Pt presents with a mild oral and  oropharyngeal dysphagia with generalized motor weakness  throughout structures. Pt with mild oral residuals particularly  with liquids due to decreased lingual propulsion. Weakness  throughout the hyolaryngeal complex also noted with mild  vallecular and pharyngeal residuals present, again particularly  with thin liquids. Heavier  solid boluses are more easily  transited. Attempted chin tuck without improvement. Double/triple  swallows clear most oral and oropharyngeal residual, also  following liquids with a heavy solid bolus improves transit.  Question general decompensation vs possibly hypertensive UES as  the cause of residual. UES could not be visualized due to pts  shoulder despite repositioning efforts though no backflow or  residue observed. Esophageal sweep WNL. Theer was no penetration  or aspriation observed during the study though penetration of  residual likely. Pt did demonstrate some hard coughing when study  was complete, expectorated clear secretions with no evidence of   barium. Recommend pt continue a regular diet with thin liquids  with compensatory strategies to reduce mild risk of aspiration.  SLP will f/u in acute setting, recommend SLP at next level of  care for possible strengthening exercises.     Treatment Recommendation  Therapy as outlined in treatment plan below    Diet Recommendation Regular;Thin liquid   Liquid Administration via: Cup;Straw Medication Administration: Whole meds with liquid Supervision: Patient able to self feed;Full supervision/cueing  for compensatory strategies Compensations: Slow rate;Small sips/bites;Multiple dry swallows  after each bite/sip;Effortful swallow (follow liquids with solids  when possible) Postural Changes and/or Swallow Maneuvers: Seated upright 90  degrees;Upright 30-60 min after meal    Other  Recommendations Oral Care Recommendations: Oral care BID Other Recommendations: Have oral suction available;Clarify  dietary restrictions   Follow Up Recommendations  Skilled Nursing facility    Frequency and Duration min 2x/week  2 weeks   Pertinent Vitals/Pain NA    SLP Swallow Goals Patient will consume recommended diet without observed clinical  signs of aspiration with: Supervision/safety Patient will utilize recommended strategies during swallow to  increase swallowing safety with: Supervision/safety Goal #3: Pt will complete pharyngeal strengthening exercises x10  (or more if tolerated) with min verbal cues.    General HPI: 78 year old male with history of hypertension,  chronic kidney disease, gout, carotid artery disease and prostate  cancer was brought to the ER patient was found to be feeling  weak. As per patient's wife patient has been feeling weak for  last 2 days and complaining of generalized body ache. Last  evening he felt very weak to walk and he was brought to the ER.  In the ER he was found to be febrile with temperatures around  102F. Chest x-ray and UA were unremarkable. EKG showed sinus  rhythm with PVCs. Patient  temperature improved with Tylenol.  Patient otherwise denies any chest pain shortness of breath  nausea vomiting or diarrhea. Patient's initial hemoglobin was on  5 and repeat was 11. At this time patient has been admitted for  further management. Bedside evaluation indicated possible silent  aspiration or esophageal dysphagia. MBS warranted to determine  safety with PO.  Type of Study: Modified Barium Swallowing Study Reason for Referral: Objectively evaluate swallowing function Previous Swallow Assessment: BSE 12/28/11 Diet Prior to this Study: Regular;Thin liquids Temperature Spikes Noted: No Respiratory Status: Supplemental O2 delivered via (comment) History of Recent Intubation: No Behavior/Cognition: Alert;Cooperative Oral Cavity - Dentition: Adequate natural dentition Oral Motor / Sensory Function: Within functional limits Self-Feeding Abilities: Able to feed self Patient Positioning: Upright in chair Baseline Vocal Quality: Breathy;Low vocal intensity Volitional Cough: Weak Anatomy: Within functional limits Pharyngeal Secretions: Not observed secondary MBS    Reason for Referral Objectively evaluate swallowing function   Oral Phase Oral Preparation/Oral Phase Oral Phase: Impaired Oral - Thin Oral - Thin Cup: Lingual/palatal residue;Reduced posterior  propulsion Oral - Thin Straw: Lingual/palatal residue Oral - Solids Oral - Puree: Within functional limits Oral - Regular: Lingual/palatal residue;Reduced posterior  propulsion Oral - Pill: Lingual/palatal residue;Reduced posterior propulsion   Pharyngeal Phase Pharyngeal Phase Pharyngeal Phase: Impaired Pharyngeal - Thin Pharyngeal - Thin Cup: Reduced anterior laryngeal  mobility;Reduced laryngeal elevation;Reduced airway/laryngeal  closure;Reduced tongue base retraction;Reduced epiglottic  inversion;Reduced pharyngeal peristalsis;Pharyngeal residue -  valleculae;Pharyngeal residue - pyriform sinuses Pharyngeal - Thin Straw: Reduced anterior laryngeal   mobility;Reduced laryngeal elevation;Reduced airway/laryngeal  closure;Reduced tongue base retraction;Reduced epiglottic  inversion;Reduced pharyngeal peristalsis;Pharyngeal residue -  valleculae;Pharyngeal residue - pyriform sinuses Pharyngeal - Solids Pharyngeal - Puree: Reduced anterior laryngeal mobility;Reduced  laryngeal elevation;Reduced airway/laryngeal closure;Reduced  tongue base retraction;Reduced epiglottic inversion;Reduced  pharyngeal peristalsis Pharyngeal - Regular: Reduced anterior laryngeal mobility;Reduced  laryngeal elevation;Reduced airway/laryngeal closure;Reduced  tongue base retraction;Reduced epiglottic inversion;Reduced  pharyngeal peristalsis Pharyngeal - Pill: Reduced anterior laryngeal mobility;Reduced  laryngeal elevation;Reduced airway/laryngeal closure;Reduced  tongue base retraction;Reduced epiglottic inversion;Reduced  pharyngeal peristalsis  Cervical Esophageal Phase    GO    Cervical Esophageal Phase Cervical Esophageal Phase: [facility]          DeBlois, Katherene Ponto 12/30/2011, 9:14 AM      Scheduled Meds:   . amLODipine  5 mg Oral Daily  . docusate sodium  100 mg Oral BID  . levalbuterol  0.63 mg Nebulization TID  . linezolid  600 mg Oral Q12H  . lisinopril  20 mg Oral Daily  . omega-3 acid ethyl esters  1 g Oral Daily  . pantoprazole  40 mg Oral Daily  . piperacillin-tazobactam (ZOSYN)  IV  3.375 g Intravenous Q8H  . polyethylene glycol  17 g Oral Daily  . sodium chloride  3 mL Intravenous Q12H  . Tamsulosin HCl  0.4 mg Oral Daily  . timolol  1 drop Both Eyes BID   Continuous Infusions:   Principal Problem:  *Pneumonia Active Problems:  Weakness  Fever  Renal failure (ARF), acute on chronic  HTN (hypertension)  Rhabdomyolysis  SIRS (systemic inflammatory response syndrome)  Time spent: 35 min  Elizaveta Mattice V.  [facility] Pager [phone]. If 8PM-8AM, please contact night-coverage at [email or web address], password [facility] 12/30/2011, 10:38 AM  LOS: 5  days

## 2011-12-30 NOTE — Progress Notes (Signed)
ANTIBIOTIC CONSULT NOTE - FOLLOW UP  Pharmacy Consult for Zosyn Indication: CAP  Allergies  Allergen Reactions  . Shellfish Allergy Anaphylaxis    Pt is allergic to mussels     Patient Measurements: Height: 5' 5.5" (166.4 cm) Weight: 156 lb 12.8 oz (71.124 kg) IBW/kg (Calculated) : 62.65   Vital Signs: Temp: 97.7 F (36.5 C) (12/14 0508) Temp src: Oral (12/14 0508) BP: 131/58 mmHg (12/14 0508) Pulse Rate: 66  (12/14 0508)  Intake/Output from previous day: 12/13 0701 - 12/14 0700 In: 810 [P.O.:660; IV Piggyback:150] Out: 1100 [Urine:1100] Intake/Output from this shift:    Labs:  Basename 12/30/11 0526 12/29/11 0450  WBC -- --  HGB -- --  PLT -- --  LABCREA -- --  CREATININE 1.63* 2.03*   Estimated Creatinine Clearance: 30.5 ml/min (by C-G formula based on Cr of 1.63). No results found for this basename: VANCOTROUGH:2,VANCOPEAK:2,VANCORANDOM:2,GENTTROUGH:2,GENTPEAK:2,GENTRANDOM:2,TOBRATROUGH:2,TOBRAPEAK:2,TOBRARND:2,AMIKACINPEAK:2,AMIKACINTROU:2,AMIKACIN:2, in the last 72 hours   Microbiology: Recent Results (from the past 720 hour(s))  CULTURE, BLOOD (ROUTINE X 2)     Status: Normal (Preliminary result)   Collection Time   12/25/11  2:10 AM      Component Value Range Status Comment   Specimen Description BLOOD BLOOD RIGHT FOREARM   Final    Special Requests BOTTLES DRAWN AEROBIC AND ANAEROBIC 2 CC EACH   Final    Culture  Setup Time 12/25/2011 10:04   Final    Culture     Final    Value:        BLOOD CULTURE RECEIVED NO GROWTH TO DATE CULTURE WILL BE HELD FOR 5 DAYS BEFORE ISSUING A FINAL NEGATIVE REPORT   Report Status PENDING   Incomplete   CULTURE, BLOOD (ROUTINE X 2)     Status: Normal   Collection Time   12/25/11  2:10 AM      Component Value Range Status Comment   Specimen Description BLOOD RIGHT ANTECUBITAL   Final    Special Requests BOTTLES DRAWN AEROBIC AND ANAEROBIC 5 CC EACH   Final    Culture  Setup Time 12/25/2011 10:04   Final    Culture      Final    Value: STAPHYLOCOCCUS SPECIES (COAGULASE NEGATIVE)     Note: RIFAMPIN AND GENTAMICIN SHOULD NOT BE USED AS SINGLE DRUGS FOR TREATMENT OF STAPH INFECTIONS.     Note: Gram Stain Report Called to,Read Back By and Verified With: AMANDA THOMPSON 12/26/11 0830 BY SMITHERSJ   Report Status 12/29/2011 FINAL   Final    Organism ID, Bacteria STAPHYLOCOCCUS SPECIES (COAGULASE NEGATIVE)   Final   CULTURE, BLOOD (ROUTINE X 2)     Status: Normal (Preliminary result)   Collection Time   12/26/11  9:50 AM      Component Value Range Status Comment   Specimen Description BLOOD RIGHT ARM   Final    Special Requests BOTTLES DRAWN AEROBIC AND ANAEROBIC 5CC   Final    Culture  Setup Time 12/26/2011 20:54   Final    Culture     Final    Value:        BLOOD CULTURE RECEIVED NO GROWTH TO DATE CULTURE WILL BE HELD FOR 5 DAYS BEFORE ISSUING A FINAL NEGATIVE REPORT   Report Status PENDING   Incomplete   CULTURE, BLOOD (ROUTINE X 2)     Status: Normal (Preliminary result)   Collection Time   12/26/11  9:55 AM      Component Value Range Status Comment   Specimen Description  BLOOD RIGHT HAND   Final    Special Requests BOTTLES DRAWN AEROBIC ONLY 5CC   Final    Culture  Setup Time 12/26/2011 20:54   Final    Culture     Final    Value:        BLOOD CULTURE RECEIVED NO GROWTH TO DATE CULTURE WILL BE HELD FOR 5 DAYS BEFORE ISSUING A FINAL NEGATIVE REPORT   Report Status PENDING   Incomplete   CULTURE, EXPECTORATED SPUTUM-ASSESSMENT     Status: Normal   Collection Time   12/27/11  6:41 PM      Component Value Range Status Comment   Specimen Description SPUTUM   Final    Special Requests NONE   Final    Sputum evaluation     Final    Value: THIS SPECIMEN IS ACCEPTABLE. RESPIRATORY CULTURE REPORT TO FOLLOW.   Report Status 12/27/2011 FINAL   Final   CULTURE, RESPIRATORY     Status: Normal (Preliminary result)   Collection Time   12/27/11  6:41 PM      Component Value Range Status Comment   Specimen  Description SPUTUM   Final    Special Requests NONE   Final    Gram Stain     Final    Value: MODERATE WBC PRESENT,BOTH PMN AND MONONUCLEAR     RARE SQUAMOUS EPITHELIAL CELLS PRESENT     NO ORGANISMS SEEN   Culture NORMAL OROPHARYNGEAL FLORA   Final    Report Status PENDING   Incomplete     Anti-infectives     Start     Dose/Rate Route Frequency Ordered Stop   12/28/11 1530   linezolid (ZYVOX) tablet 600 mg        600 mg Oral Every 12 hours 12/28/11 1428     12/27/11 1430  piperacillin-tazobactam (ZOSYN) IVPB 3.375 g       3.375 g 12.5 mL/hr over 240 Minutes Intravenous 3 times per day 12/27/11 1425     12/26/11 1100   vancomycin (VANCOCIN) IVPB 1000 mg/200 mL premix  Status:  Discontinued        1,000 mg 200 mL/hr over 60 Minutes Intravenous Every 24 hours 12/26/11 0954 12/28/11 1428   12/25/11 0330   oseltamivir (TAMIFLU) capsule 75 mg        75 mg Oral  Once 12/25/11 0326 12/25/11 0358          Assessment: 78 yo M on Day #4 Zosyn (EI) and Day #2 linezolid (per MD) for CAP. SCr starting to improve, CrCl ~ 30 ml/min. Zosyn dose remains appropriate.  Plan:  Continue current Zosyn regimen Continue linezolid per MD  Verdia Kuba, PharmD Pager: [phone] 12/30/2011,10:48 AM

## 2011-12-31 DIAGNOSIS — I10 Essential (primary) hypertension: Secondary | ICD-10-CM | POA: Diagnosis not present

## 2011-12-31 DIAGNOSIS — D649 Anemia, unspecified: Secondary | ICD-10-CM | POA: Diagnosis not present

## 2011-12-31 DIAGNOSIS — R5381 Other malaise: Secondary | ICD-10-CM | POA: Diagnosis not present

## 2011-12-31 DIAGNOSIS — J189 Pneumonia, unspecified organism: Secondary | ICD-10-CM | POA: Diagnosis not present

## 2011-12-31 LAB — CULTURE, BLOOD (ROUTINE X 2): Culture: NO GROWTH

## 2011-12-31 NOTE — Progress Notes (Signed)
[facility] PROGRESS NOTE  MALAKAI VANGELDER [id number] DOB: [birth year] DOA: 12/25/2011 PCP: [name], MD  Assessment/Plan: 1. Bacterial CAP versus aspiration pneumonia. Thick green sputum, frequent desaturations  - cont linezolid and d/c zosyn tomorrow and change to levofloxacin to finish total 7 days from 12/11. Can go to SNF tomorrow (if family agrees) or go home with [place] (in the next few days).   - Strep pneumo and legionella ag ng  - Sputum cx pending. MBS showed dysphagia and recommended regular, thin liquid regular diet  - off oxygen now. Hopefully he will not require oxygen at home  Coag neg staph bacteremia, may be contaminant in the setting of pneumonia. ID note reviewed. Will d/c vanc for now. Repeat BCx on 12/10 ng. TTE with EF 55 to 60% and no evidence of infection. Follow fever, wbc and clinical improvement.   Weakness, generalized: Secondary to above, consider SNF. Status post IVF, not very interested in moving much.   Fever: Resolved   Urinary retention : no more issues. We will get bladder scan and start I/O foley if he has >300 cc urine collected. He has condom cath. Avoid foley as possible.   HTN (hypertension): will monitor   Rhabdomyolysis very mild: Hold statin for now but should be able to resume as outpatient.   Hyponatremia: will monitor   Acute on chronic kidney disease: May have been prerenal from acute illness.   DIET: Healthy heart  ACCESS: PIV  IVF: None  PROPH: SCDs   Code Status: Full code  Family Communication: Discussed plan with daughter today  Disposition Plan: [facility]   Consultants:  - Infectious disease  Procedures:  2-D echo  Antibiotics:  Anti-infectives     Start     Dose/Rate Route Frequency Ordered Stop   12/28/11 1530   linezolid (ZYVOX) tablet 600 mg        600 mg Oral Every 12 hours 12/28/11 1428     12/27/11 1430  piperacillin-tazobactam (ZOSYN) IVPB 3.375 g       3.375 g 12.5 mL/hr over 240 Minutes  Intravenous 3 times per day 12/27/11 1425     12/26/11 1100   vancomycin (VANCOCIN) IVPB 1000 mg/200 mL premix  Status:  Discontinued        1,000 mg 200 mL/hr over 60 Minutes Intravenous Every 24 hours 12/26/11 0954 12/28/11 1428   12/25/11 0330   oseltamivir (TAMIFLU) capsule 75 mg        75 mg Oral  Once 12/25/11 0326 12/25/11 0358          HPI/Subjective: Patient has no new issues today. She has been off oxygen at this time. No SOB, cough, sputum or CP noted. Oral intake still low.   Objective: Filed Vitals:   12/30/11 2055 12/30/11 2100 12/31/11 0517 12/31/11 0820  BP: 137/65  157/60   Pulse: 64  65   Temp: 97.3 F (36.3 C)  97.7 F (36.5 C)   TempSrc: Oral  Oral   Resp: 18  20   Height:      Weight:      SpO2: 100% 99% 98% 94%    Intake/Output Summary (Last 24 hours) at 12/31/11 1331 Last data filed at 12/31/11 1100  Gross per 24 hour  Intake    360 ml  Output    825 ml  Net   -465 ml   Filed Weights   12/27/11 0427 12/28/11 0522 12/29/11 0435  Weight: 71.8 kg (158 lb 4.6 oz) 70 kg (  154 lb 5.2 oz) 71.124 kg (156 lb 12.8 oz)    Exam:   General:  Lethargic and sleepy  Cardiovascular: S1S2 reg, no m/r/g  Respiratory: improved and clear. CTAB, GBAE  Abdomen: NT, ND, BS+  Ext : no c/c/e  Data Reviewed: Basic Metabolic Panel:  Lab [id number] 12/29/11 0450 12/26/11 0519 12/25/11 0900 12/25/11 0210  NA 131* 129* 131* 134* 130*  K 4.0 4.1 3.5 3.8 3.7  CL 97 95* 97 102 96  CO2 23 24 21 22 20   GLUCOSE 97 111* 111* 108* 139*  BUN 35* 38* 18 22 26*  CREATININE 1.63* 2.03* 1.45* 1.72* 2.01*  CALCIUM 8.6 8.5 8.6 8.4 8.9  MG -- -- -- -- --  PHOS -- -- -- -- --   Liver Function Tests:  Lab 12/25/11 0210  AST 70*  ALT 50  ALKPHOS 99  BILITOT 1.9*  PROT 7.0  ALBUMIN 3.2*   No results found for this basename: LIPASE:5,AMYLASE:5 in the last 168 hours No results found for this basename: AMMONIA:5 in the last 168 hours CBC:  Lab 12/26/11 0519  12/25/11 0900 12/25/11 0520 12/25/11 0210  WBC 8.7 8.9 9.3 16.1*  NEUTROABS -- 6.8 7.3 --  HGB 11.5* 11.7* 11.0* 5.2*  HCT 33.2* 33.8* 31.1* 14.6*  MCV 85.1 85.6 84.3 85.4  PLT 163 148* 148* 260   Cardiac Enzymes:  Lab 12/26/11 0519 12/25/11 0900 12/25/11 0520 12/25/11 0210  CKTOTAL 561* 1043* -- 272*  CKMB -- -- -- --  CKMBINDEX -- -- -- --  TROPONINI -- <0.30 <0.30 --   BNP (last 3 results) No results found for this basename: PROBNP:3 in the last 8760 hours CBG:  Lab 12/25/11 0148  GLUCAP 140*    Recent Results (from the past 240 hour(s))  CULTURE, BLOOD (ROUTINE X 2)     Status: Normal   Collection Time   12/25/11  2:10 AM      Component Value Range Status Comment   Specimen Description BLOOD BLOOD RIGHT FOREARM   Final    Special Requests BOTTLES DRAWN AEROBIC AND ANAEROBIC 2 CC EACH   Final    Culture  Setup Time 12/25/2011 10:04   Final    Culture NO GROWTH 5 DAYS   Final    Report Status 12/31/2011 FINAL   Final   CULTURE, BLOOD (ROUTINE X 2)     Status: Normal   Collection Time   12/25/11  2:10 AM      Component Value Range Status Comment   Specimen Description BLOOD RIGHT ANTECUBITAL   Final    Special Requests BOTTLES DRAWN AEROBIC AND ANAEROBIC 5 CC EACH   Final    Culture  Setup Time 12/25/2011 10:04   Final    Culture     Final    Value: STAPHYLOCOCCUS SPECIES (COAGULASE NEGATIVE)     Note: RIFAMPIN AND GENTAMICIN SHOULD NOT BE USED AS SINGLE DRUGS FOR TREATMENT OF STAPH INFECTIONS.     Note: Gram Stain Report Called to,Read Back By and Verified With: AMANDA THOMPSON 12/26/11 0830 BY SMITHERSJ   Report Status 12/29/2011 FINAL   Final    Organism ID, Bacteria STAPHYLOCOCCUS SPECIES (COAGULASE NEGATIVE)   Final   CULTURE, BLOOD (ROUTINE X 2)     Status: Normal (Preliminary result)   Collection Time   12/26/11  9:50 AM      Component Value Range Status Comment   Specimen Description BLOOD RIGHT ARM   Final    Special Requests  BOTTLES DRAWN AEROBIC AND  ANAEROBIC 5CC   Final    Culture  Setup Time 12/26/2011 20:54   Final    Culture     Final    Value:        BLOOD CULTURE RECEIVED NO GROWTH TO DATE CULTURE WILL BE HELD FOR 5 DAYS BEFORE ISSUING A FINAL NEGATIVE REPORT   Report Status PENDING   Incomplete   CULTURE, BLOOD (ROUTINE X 2)     Status: Normal (Preliminary result)   Collection Time   12/26/11  9:55 AM      Component Value Range Status Comment   Specimen Description BLOOD RIGHT HAND   Final    Special Requests BOTTLES DRAWN AEROBIC ONLY 5CC   Final    Culture  Setup Time 12/26/2011 20:54   Final    Culture     Final    Value:        BLOOD CULTURE RECEIVED NO GROWTH TO DATE CULTURE WILL BE HELD FOR 5 DAYS BEFORE ISSUING A FINAL NEGATIVE REPORT   Report Status PENDING   Incomplete   CULTURE, EXPECTORATED SPUTUM-ASSESSMENT     Status: Normal   Collection Time   12/27/11  6:41 PM      Component Value Range Status Comment   Specimen Description SPUTUM   Final    Special Requests NONE   Final    Sputum evaluation     Final    Value: THIS SPECIMEN IS ACCEPTABLE. RESPIRATORY CULTURE REPORT TO FOLLOW.   Report Status 12/27/2011 FINAL   Final   CULTURE, RESPIRATORY     Status: Normal   Collection Time   12/27/11  6:41 PM      Component Value Range Status Comment   Specimen Description SPUTUM   Final    Special Requests NONE   Final    Gram Stain     Final    Value: MODERATE WBC PRESENT,BOTH PMN AND MONONUCLEAR     RARE SQUAMOUS EPITHELIAL CELLS PRESENT     NO ORGANISMS SEEN   Culture NORMAL OROPHARYNGEAL FLORA   Final    Report Status 12/30/2011 FINAL   Final      Studies: Dg Swallowing Func-speech Pathology  12/30/2011  Katherene Ponto [place], CCC-SLP     12/30/2011  9:16 AM Objective Swallowing Evaluation: Modified Barium Swallowing Study   Patient Details  Name: RISHABH MCMURDIE MRN: [id number] Date of Birth: 1926/07/17  Today's Date: 12/30/2011 Time: 0825-0851 SLP Time Calculation (min): 26 min  Past Medical History:  Past  Medical History  Diagnosis Date  . ACUTE RENAL FAILURE W/LESION OF TUBULAR NECROSIS 01/04/2007  . CAROTID ARTERY DISEASE 06/01/2009  . DEPRESSION 07/25/2006  . DIVERTICULOSIS OF COLON 02/01/2007  . GOUT 07/25/2006  . HEMORRHOID, THROMBOSED 02/15/2009  . HYPERCHOLESTEROLEMIA 07/25/2006  . HYPERTENSION 07/25/2006  . PROSTATE CANCER, HX OF 07/25/2006  . Rosacea 09/28/2008  . WEIGHT LOSS 09/01/2009  . Glaucoma(365)    Past Surgical History:  Past Surgical History  Procedure Date  . Carotid endarterectomy   . Cataract extraction    HPI:  78 year old male with history of hypertension, chronic kidney  disease, gout, carotid artery disease and prostate cancer was  brought to the ER patient was found to be feeling weak. As per  patient's wife patient has been feeling weak for last 2 days and  complaining of generalized body ache. Last evening he felt very  weak to walk and he was brought to the ER. In the ER  he was found  to be febrile with temperatures around 102F. Chest x-ray and UA  were unremarkable. EKG showed sinus rhythm with PVCs. Patient  temperature improved with Tylenol. Patient otherwise denies any  chest pain shortness of breath nausea vomiting or diarrhea.  Patient's initial hemoglobin was on 5 and repeat was 11. At this  time patient has been admitted for further management. Bedside  evaluation indicated possible silent aspiration or esophageal  dysphagia. MBS warranted to determine safety with PO.      Assessment / Plan / Recommendation Clinical Impression  Dysphagia Diagnosis: Mild oral phase dysphagia;Mild pharyngeal  phase dysphagia Clinical impression: Pt presents with a mild oral and  oropharyngeal dysphagia with generalized motor weakness  throughout structures. Pt with mild oral residuals particularly  with liquids due to decreased lingual propulsion. Weakness  throughout the hyolaryngeal complex also noted with mild  vallecular and pharyngeal residuals present, again particularly  with thin liquids. Heavier solid  boluses are more easily  transited. Attempted chin tuck without improvement. Double/triple  swallows clear most oral and oropharyngeal residual, also  following liquids with a heavy solid bolus improves transit.  Question general decompensation vs possibly hypertensive UES as  the cause of residual. UES could not be visualized due to pts  shoulder despite repositioning efforts though no backflow or  residue observed. Esophageal sweep WNL. Theer was no penetration  or aspriation observed during the study though penetration of  residual likely. Pt did demonstrate some hard coughing when study  was complete, expectorated clear secretions with no evidence of  barium. Recommend pt continue a regular diet with thin liquids  with compensatory strategies to reduce mild risk of aspiration.  SLP will f/u in acute setting, recommend SLP at next level of  care for possible strengthening exercises.     Treatment Recommendation  Therapy as outlined in treatment plan below    Diet Recommendation Regular;Thin liquid   Liquid Administration via: Cup;Straw Medication Administration: Whole meds with liquid Supervision: Patient able to self feed;Full supervision/cueing  for compensatory strategies Compensations: Slow rate;Small sips/bites;Multiple dry swallows  after each bite/sip;Effortful swallow (follow liquids with solids  when possible) Postural Changes and/or Swallow Maneuvers: Seated upright 90  degrees;Upright 30-60 min after meal    Other  Recommendations Oral Care Recommendations: Oral care BID Other Recommendations: Have oral suction available;Clarify  dietary restrictions   Follow Up Recommendations  Skilled Nursing facility    Frequency and Duration min 2x/week  2 weeks   Pertinent Vitals/Pain NA    SLP Swallow Goals Patient will consume recommended diet without observed clinical  signs of aspiration with: Supervision/safety Patient will utilize recommended strategies during swallow to  increase swallowing safety with:  Supervision/safety Goal #3: Pt will complete pharyngeal strengthening exercises x10  (or more if tolerated) with min verbal cues.    General HPI: 78 year old male with history of hypertension,  chronic kidney disease, gout, carotid artery disease and prostate  cancer was brought to the ER patient was found to be feeling  weak. As per patient's wife patient has been feeling weak for  last 2 days and complaining of generalized body ache. Last  evening he felt very weak to walk and he was brought to the ER.  In the ER he was found to be febrile with temperatures around  102F. Chest x-ray and UA were unremarkable. EKG showed sinus  rhythm with PVCs. Patient temperature improved with Tylenol.  Patient otherwise denies any chest pain shortness of breath  nausea  vomiting or diarrhea. Patient's initial hemoglobin was on  5 and repeat was 11. At this time patient has been admitted for  further management. Bedside evaluation indicated possible silent  aspiration or esophageal dysphagia. MBS warranted to determine  safety with PO.  Type of Study: Modified Barium Swallowing Study Reason for Referral: Objectively evaluate swallowing function Previous Swallow Assessment: BSE 12/28/11 Diet Prior to this Study: Regular;Thin liquids Temperature Spikes Noted: No Respiratory Status: Supplemental O2 delivered via (comment) History of Recent Intubation: No Behavior/Cognition: Alert;Cooperative Oral Cavity - Dentition: Adequate natural dentition Oral Motor / Sensory Function: Within functional limits Self-Feeding Abilities: Able to feed self Patient Positioning: Upright in chair Baseline Vocal Quality: Breathy;Low vocal intensity Volitional Cough: Weak Anatomy: Within functional limits Pharyngeal Secretions: Not observed secondary MBS    Reason for Referral Objectively evaluate swallowing function   Oral Phase Oral Preparation/Oral Phase Oral Phase: Impaired Oral - Thin Oral - Thin Cup: Lingual/palatal residue;Reduced posterior   propulsion Oral - Thin Straw: Lingual/palatal residue Oral - Solids Oral - Puree: Within functional limits Oral - Regular: Lingual/palatal residue;Reduced posterior  propulsion Oral - Pill: Lingual/palatal residue;Reduced posterior propulsion   Pharyngeal Phase Pharyngeal Phase Pharyngeal Phase: Impaired Pharyngeal - Thin Pharyngeal - Thin Cup: Reduced anterior laryngeal  mobility;Reduced laryngeal elevation;Reduced airway/laryngeal  closure;Reduced tongue base retraction;Reduced epiglottic  inversion;Reduced pharyngeal peristalsis;Pharyngeal residue -  valleculae;Pharyngeal residue - pyriform sinuses Pharyngeal - Thin Straw: Reduced anterior laryngeal  mobility;Reduced laryngeal elevation;Reduced airway/laryngeal  closure;Reduced tongue base retraction;Reduced epiglottic  inversion;Reduced pharyngeal peristalsis;Pharyngeal residue -  valleculae;Pharyngeal residue - pyriform sinuses Pharyngeal - Solids Pharyngeal - Puree: Reduced anterior laryngeal mobility;Reduced  laryngeal elevation;Reduced airway/laryngeal closure;Reduced  tongue base retraction;Reduced epiglottic inversion;Reduced  pharyngeal peristalsis Pharyngeal - Regular: Reduced anterior laryngeal mobility;Reduced  laryngeal elevation;Reduced airway/laryngeal closure;Reduced  tongue base retraction;Reduced epiglottic inversion;Reduced  pharyngeal peristalsis Pharyngeal - Pill: Reduced anterior laryngeal mobility;Reduced  laryngeal elevation;Reduced airway/laryngeal closure;Reduced  tongue base retraction;Reduced epiglottic inversion;Reduced  pharyngeal peristalsis  Cervical Esophageal Phase    GO    Cervical Esophageal Phase Cervical Esophageal Phase: [facility]          DeBlois, Katherene Ponto 12/30/2011, 9:14 AM      Scheduled Meds:   . amLODipine  5 mg Oral Daily  . docusate sodium  100 mg Oral BID  . levalbuterol  0.63 mg Nebulization TID  . linezolid  600 mg Oral Q12H  . lisinopril  20 mg Oral Daily  . omega-3 acid ethyl esters  1 g Oral Daily  .  pantoprazole  40 mg Oral Daily  . piperacillin-tazobactam (ZOSYN)  IV  3.375 g Intravenous Q8H  . polyethylene glycol  17 g Oral Daily  . sodium chloride  3 mL Intravenous Q12H  . Tamsulosin HCl  0.4 mg Oral Daily  . timolol  1 drop Both Eyes BID   Continuous Infusions:   Principal Problem:  *Pneumonia Active Problems:  Weakness  Fever  Renal failure (ARF), acute on chronic  Rhabdomyolysis  SIRS (systemic inflammatory response syndrome)  HTN (hypertension)    Time spent: 35 min    Shakari Qazi V.  [facility] Pager [phone]. If 8PM-8AM, please contact night-coverage at [email or web address], password [facility] 12/31/2011, 1:31 PM  LOS: 6 days

## 2012-01-01 DIAGNOSIS — N170 Acute kidney failure with tubular necrosis: Secondary | ICD-10-CM | POA: Diagnosis not present

## 2012-01-01 DIAGNOSIS — N189 Chronic kidney disease, unspecified: Secondary | ICD-10-CM | POA: Diagnosis not present

## 2012-01-01 DIAGNOSIS — J111 Influenza due to unidentified influenza virus with other respiratory manifestations: Secondary | ICD-10-CM

## 2012-01-01 DIAGNOSIS — J189 Pneumonia, unspecified organism: Secondary | ICD-10-CM | POA: Diagnosis not present

## 2012-01-01 DIAGNOSIS — D649 Anemia, unspecified: Secondary | ICD-10-CM | POA: Diagnosis not present

## 2012-01-01 LAB — CULTURE, BLOOD (ROUTINE X 2)
Culture: NO GROWTH
Culture: NO GROWTH

## 2012-01-01 LAB — CLOSTRIDIUM DIFFICILE BY PCR: Toxigenic C. Difficile by PCR: NEGATIVE

## 2012-01-01 MED ORDER — LEVOFLOXACIN 750 MG PO TABS
750.0000 mg | ORAL_TABLET | Freq: Every day | ORAL | Status: AC
  Administered 2012-01-01: 750 mg via ORAL
  Filled 2012-01-01: qty 1

## 2012-01-01 MED ORDER — HYDRALAZINE HCL 20 MG/ML IJ SOLN
5.0000 mg | Freq: Four times a day (QID) | INTRAMUSCULAR | Status: DC | PRN
  Administered 2012-01-01 – 2012-01-02 (×3): 5 mg via INTRAVENOUS
  Filled 2012-01-01 (×3): qty 1

## 2012-01-01 NOTE — Progress Notes (Signed)
Physical Therapy Treatment Patient Details Name: Alexander Mcdonald MRN: [id number] DOB: 21-Jul-1926 Today's Date: 01/01/2012 Time: [id number] PT Time Calculation (min): 25 min  PT Assessment / Plan / Recommendation Comments on Treatment Session  Pt OOB in recliner with daughter in room.  Pt on RA @ 94%.  Amb pt in [place] on RA and sats avg 90%.  Pt progressing well and family plans to take him home.  A son plans to stay with them and the daughter plans to take some time off work.      Follow Up Recommendations  Home health PT (family declines SNF)     Does the patient have the potential to tolerate intense rehabilitation     Barriers to Discharge        Equipment Recommendations  Rolling walker with 5" wheels    Recommendations for Other Services    Frequency Min 3X/week   Plan Discharge plan remains appropriate    Precautions / Restrictions Precautions Precautions: Fall Precaution Comments: B lobe PNA Restrictions Weight Bearing Restrictions: No   Pertinent Vitals/Pain No c/o pain    Mobility  Bed Mobility Bed Mobility: Not assessed Details for Bed Mobility Assistance: Pt OOB in recliner Transfers Transfers: Sit to Stand;Stand to Sit Sit to Stand: 4: Min assist;From chair/3-in-1 Stand to Sit: 4: Min assist;To chair/3-in-1 Details for Transfer Assistance: 50% VC's on proper tech and hand placement esp with stand to sit Ambulation/Gait Ambulation/Gait Assistance: 4: Min assist Ambulation Distance (Feet): 175 Feet Assistive device: Rolling walker Ambulation/Gait Assistance Details: On RA with sats avg 90%.  Daughter assisted by following with chair as a percaution but pt didn't need it.  Still demonstrates some dyspnea and VC's on deep breathing followed by coughing. Gait Pattern: Step-through pattern;Shuffle;Trunk flexed Gait velocity: decreased     PT Goals                                              progressing    Visit Information  Last PT Received On:  01/01/12 Assistance Needed: +1    Subjective Data  Subjective: I don't feel better Patient Stated Goal: home   Cognition    good   Balance   fair depending on level of fatigue  End of Session PT - End of Session Equipment Utilized During Treatment: Gait belt Activity Tolerance: Patient limited by fatigue Patient left: in chair;with call bell/phone within reach;with family/visitor present Nurse Communication: Mobility status (Advised RN that family was cleared to amb pt several times a)   Rica Koyanagi  PTA [facility]  Acute  Rehab Pager     [phone]

## 2012-01-01 NOTE — Progress Notes (Signed)
Patient ID: Alexander Mcdonald, male   DOB: [birth year], 78 y.o.   MRN: [id number]  [facility] PROGRESS NOTE  Alexander Mcdonald DOB: 1926-08-19 DOA: 12/25/2011 PCP: [name], MD  Brief narrative: Pt is 78 year old male with history of hypertension, chronic kidney disease, gout, carotid artery disease and prostate cancer who was brought to the ER for further evaluation of progressive generalized weakness, shortness of breath, subjective fevers, chills. In ED, pt was found to be febrile with Tmax 102F and has was admitted for treatment of presumptive PNA.  Principal Problem:  *Pneumonia - pt continues to clinically improve, he has completed 6 days of therapy with Zosyn and Linezolid - Pt switched to oral Levaquin to complete the therapy for one more day inpatient - pt continues to maintain oxygen saturations at target range Active Problems:  Weakness - this is most likely secondary to #1 in addition to progressive failure to thrive and deconditioning - will be discharged home with [facility] PT, RN, aid, orders placed  Fever - secondary to PNA, now resolved  Renal failure (ARF), acute on chronic - pre renal component imposed on chronic kidney failure - creatinine is trending down  HTN (hypertension) - reasonable control while inpatient - will continue home medication regimen: Norvasc and Lisinopril   Consultants:  ID  Procedures/Studies: CXR 12/10 --> Worsening bilateral mid and lower lung heterogeneous opacities, left greater than right, atelectasis versus infiltrate.  Antibiotics:  Linezolid - 6 days  Zosyn - 6 days  Levaquin 12/16 - will discontinue 12/17  Code Status: Full Family Communication: Pt at bedside Disposition Plan: Home with [facility]  HPI/Subjective: No events overnight. Pt reports feeling better this AM.   Objective: Filed Vitals:   01/01/12 0750 01/01/12 1008 01/01/12 1432 01/01/12 1445  BP: 165/53   136/48  Pulse: 65   66  Temp:       TempSrc:      Resp:    16  Height:      Weight:      SpO2:  97% 96% 97%    Intake/Output Summary (Last 24 hours) at 01/01/12 2046 Last data filed at 01/01/12 1949  Gross per 24 hour  Intake    990 ml  Output   1016 ml  Net    -26 ml    Exam:   General:  Pt is alert, follows commands appropriately, not in acute distress  Cardiovascular: Regular rate and rhythm, S1/S2, no murmurs, no rubs, no gallops  Respiratory: Clear to auscultation bilaterally, no wheezing, no crackles, no rhonchi  Abdomen: Soft, non tender, non distended, bowel sounds present, no guarding  Extremities: No edema, pulses DP and PT palpable bilaterally  Neuro: Grossly nonfocal  Data Reviewed: Basic Metabolic Panel:  Lab [id number] 12/29/11 0450 12/26/11 0519  NA 131* 129* 131*  K 4.0 4.1 3.5  CL 97 95* 97  CO2 23 24 21   GLUCOSE 97 111* 111*  BUN 35* 38* 18  CREATININE 1.63* 2.03* 1.45*  CALCIUM 8.6 8.5 8.6  MG -- -- --  PHOS -- -- --   CBC:  Lab 12/26/11 0519  WBC 8.7  NEUTROABS --  HGB 11.5*  HCT 33.2*  MCV 85.1  PLT 163   Cardiac Enzymes:  Lab 12/26/11 0519  CKTOTAL 561*  CKMB --  CKMBINDEX --  TROPONINI --    Recent Results (from the past 240 hour(s))  CULTURE, BLOOD (ROUTINE X 2)     Status: Normal   Collection Time  12/25/11  2:10 AM      Component Value Range Status Comment   Specimen Description BLOOD BLOOD RIGHT FOREARM   Final    Special Requests BOTTLES DRAWN AEROBIC AND ANAEROBIC 2 CC EACH   Final    Culture  Setup Time 12/25/2011 10:04   Final    Culture NO GROWTH 5 DAYS   Final    Report Status 12/31/2011 FINAL   Final   CULTURE, BLOOD (ROUTINE X 2)     Status: Normal   Collection Time   12/25/11  2:10 AM      Component Value Range Status Comment   Specimen Description BLOOD RIGHT ANTECUBITAL   Final    Special Requests BOTTLES DRAWN AEROBIC AND ANAEROBIC 5 CC EACH   Final    Culture  Setup Time 12/25/2011 10:04   Final    Culture     Final    Value:  STAPHYLOCOCCUS SPECIES (COAGULASE NEGATIVE)     Note: RIFAMPIN AND GENTAMICIN SHOULD NOT BE USED AS SINGLE DRUGS FOR TREATMENT OF STAPH INFECTIONS.     Note: Gram Stain Report Called to,Read Back By and Verified With: AMANDA THOMPSON 12/26/11 0830 BY SMITHERSJ   Report Status 12/29/2011 FINAL   Final    Organism ID, Bacteria STAPHYLOCOCCUS SPECIES (COAGULASE NEGATIVE)   Final   CULTURE, BLOOD (ROUTINE X 2)     Status: Normal   Collection Time   12/26/11  9:50 AM      Component Value Range Status Comment   Specimen Description BLOOD RIGHT ARM   Final    Special Requests BOTTLES DRAWN AEROBIC AND ANAEROBIC 5CC   Final    Culture  Setup Time 12/26/2011 20:54   Final    Culture NO GROWTH 5 DAYS   Final    Report Status 01/01/2012 FINAL   Final   CULTURE, BLOOD (ROUTINE X 2)     Status: Normal   Collection Time   12/26/11  9:55 AM      Component Value Range Status Comment   Specimen Description BLOOD RIGHT HAND   Final    Special Requests BOTTLES DRAWN AEROBIC ONLY 5CC   Final    Culture  Setup Time 12/26/2011 20:54   Final    Culture NO GROWTH 5 DAYS   Final    Report Status 01/01/2012 FINAL   Final   CULTURE, EXPECTORATED SPUTUM-ASSESSMENT     Status: Normal   Collection Time   12/27/11  6:41 PM      Component Value Range Status Comment   Specimen Description SPUTUM   Final    Special Requests NONE   Final    Sputum evaluation     Final    Value: THIS SPECIMEN IS ACCEPTABLE. RESPIRATORY CULTURE REPORT TO FOLLOW.   Report Status 12/27/2011 FINAL   Final   CULTURE, RESPIRATORY     Status: Normal   Collection Time   12/27/11  6:41 PM      Component Value Range Status Comment   Specimen Description SPUTUM   Final    Special Requests NONE   Final    Gram Stain     Final    Value: MODERATE WBC PRESENT,BOTH PMN AND MONONUCLEAR     RARE SQUAMOUS EPITHELIAL CELLS PRESENT     NO ORGANISMS SEEN   Culture NORMAL OROPHARYNGEAL FLORA   Final    Report Status 12/30/2011 FINAL   Final    CLOSTRIDIUM DIFFICILE BY PCR     Status: Normal  Collection Time   01/01/12  4:35 AM      Component Value Range Status Comment   C difficile by pcr NEGATIVE  NEGATIVE Final      Scheduled Meds:   . amLODipine  5 mg Oral Daily  . docusate sodium  100 mg Oral BID  . levalbuterol  0.63 mg Nebulization TID  . linezolid  600 mg Oral Q12H  . lisinopril  20 mg Oral Daily  . omega-3 acid ethyl   1 g Oral Daily  . pantoprazole  40 mg Oral Daily  . polyethylene glycol  17 g Oral Daily  . Tamsulosin HCl  0.4 mg Oral Daily   Continuous Infusions:    Faye Ramsay, MD  [facility] Pager [phone]  If 7PM-7AM, please contact night-coverage [email or web address] Password [email or web address] 01/01/2012, 8:46 PM   LOS: 7 days

## 2012-01-01 NOTE — Progress Notes (Signed)
Received message from [place] that patient plans to return home at dischage/ contact person is the daughter [name]; [name] called [phone]) and stated that the patient plans to return home with home health services with [facility] and request a bedside commode and a walker; Attending MD please order HHRN/ PT/OT/ nurses aide and Soc worker; DME - bedside commode and walker at discharge. Kirke Corin RN with [facility] called for arrangements; B Pennie Rushing.

## 2012-01-01 NOTE — Progress Notes (Signed)
Weekend CSW spoke with patient's daughter, [name] re: SNF decision. Daughter states that she would prefer that he return home at discharge rather than SNF. RNCM, Hassan Rowan aware. CSW will continue to follow should the discharge plan change back to SNF.   Winfred Leeds, [facility] Clinical Social Worker cell #: [phone]

## 2012-01-01 NOTE — Progress Notes (Addendum)
Patient ID: Alexander Mcdonald, male   DOB: April 05, 1926, 78 y.o.   MRN: [id number]    [name]    Date of Admission:  12/25/2011           Day 6 piperacillin tazobactam        Day 5 linezolid Principal Problem:  *Pneumonia Active Problems:  SIRS (systemic inflammatory response syndrome)  Weakness  Fever  Renal failure (ARF), acute on chronic  HTN (hypertension)  Rhabdomyolysis  Subjective: He says that he feels much better. He still has some productive cough. He walked in the hallway today with physical therapy and his room air O2 saturations have ranged around 90%. He states that he was tired after walking but was not short of breath.  Objective: Temp:  [96.6 F (35.9 C)-97.6 F (36.4 C)] 97.6 F (36.4 C) (12/16 0628) Pulse Rate:  [65-69] 66  (12/16 1445) Resp:  [16-18] 16  (12/16 1445) BP: (136-180)/(48-70) 136/48 mmHg (12/16 1445) SpO2:  [96 %-97 %] 97 % (12/16 1445) Weight:  [68.5 kg (151 lb 0.2 oz)] 68.5 kg (151 lb 0.2 oz) (12/16 [id number])  General: He looks much better. He is smiling and sitting up in a chair watching TV Lungs: Clearer Cor: Regular S1 and S2. No change in 2/6 systolic murmur  Lab Results Lab Results  Component Value Date   WBC 8.7 12/26/2011   HGB 11.5* 12/26/2011   HCT 33.2* 12/26/2011   MCV 85.1 12/26/2011   PLT 163 12/26/2011    Lab Results  Component Value Date   CREATININE 1.63* 12/30/2011   BUN 35* 12/30/2011   NA 131* 12/30/2011   K 4.0 12/30/2011   CL 97 12/30/2011   CO2 23 12/30/2011    Lab Results  Component Value Date   ALT 50 12/25/2011   AST 70* 12/25/2011   ALKPHOS 99 12/25/2011   BILITOT 1.9* 12/25/2011      Microbiology: Recent Results (from the past 240 hour(s))  CULTURE, BLOOD (ROUTINE X 2)     Status: Normal   Collection Time   12/25/11  2:10 AM      Component Value Range Status Comment   Specimen Description BLOOD BLOOD RIGHT FOREARM   Final    Special Requests BOTTLES DRAWN AEROBIC AND  ANAEROBIC 2 CC EACH   Final    Culture  Setup Time 12/25/2011 10:04   Final    Culture NO GROWTH 5 DAYS   Final    Report Status 12/31/2011 FINAL   Final   CULTURE, BLOOD (ROUTINE X 2)     Status: Normal   Collection Time   12/25/11  2:10 AM      Component Value Range Status Comment   Specimen Description BLOOD RIGHT ANTECUBITAL   Final    Special Requests BOTTLES DRAWN AEROBIC AND ANAEROBIC 5 CC EACH   Final    Culture  Setup Time 12/25/2011 10:04   Final    Culture     Final    Value: STAPHYLOCOCCUS SPECIES (COAGULASE NEGATIVE)     Note: RIFAMPIN AND GENTAMICIN SHOULD NOT BE USED AS SINGLE DRUGS FOR TREATMENT OF STAPH INFECTIONS.     Note: Gram Stain Report Called to,Read Back By and Verified With: AMANDA THOMPSON 12/26/11 0830 BY SMITHERSJ   Report Status 12/29/2011 FINAL   Final    Organism ID, Bacteria STAPHYLOCOCCUS SPECIES (COAGULASE NEGATIVE)   Final   CULTURE, BLOOD (ROUTINE X 2)     Status: Normal   Collection  Time   12/26/11  9:50 AM      Component Value Range Status Comment   Specimen Description BLOOD RIGHT ARM   Final    Special Requests BOTTLES DRAWN AEROBIC AND ANAEROBIC 5CC   Final    Culture  Setup Time 12/26/2011 20:54   Final    Culture NO GROWTH 5 DAYS   Final    Report Status 01/01/2012 FINAL   Final   CULTURE, BLOOD (ROUTINE X 2)     Status: Normal   Collection Time   12/26/11  9:55 AM      Component Value Range Status Comment   Specimen Description BLOOD RIGHT HAND   Final    Special Requests BOTTLES DRAWN AEROBIC ONLY 5CC   Final    Culture  Setup Time 12/26/2011 20:54   Final    Culture NO GROWTH 5 DAYS   Final    Report Status 01/01/2012 FINAL   Final   CULTURE, EXPECTORATED SPUTUM-ASSESSMENT     Status: Normal   Collection Time   12/27/11  6:41 PM      Component Value Range Status Comment   Specimen Description SPUTUM   Final    Special Requests NONE   Final    Sputum evaluation     Final    Value: THIS SPECIMEN IS ACCEPTABLE. RESPIRATORY CULTURE  REPORT TO FOLLOW.   Report Status 12/27/2011 FINAL   Final   CULTURE, RESPIRATORY     Status: Normal   Collection Time   12/27/11  6:41 PM      Component Value Range Status Comment   Specimen Description SPUTUM   Final    Special Requests NONE   Final    Gram Stain     Final    Value: MODERATE WBC PRESENT,BOTH PMN AND MONONUCLEAR     RARE SQUAMOUS EPITHELIAL CELLS PRESENT     NO ORGANISMS SEEN   Culture NORMAL OROPHARYNGEAL FLORA   Final    Report Status 12/30/2011 FINAL   Final   CLOSTRIDIUM DIFFICILE BY PCR     Status: Normal   Collection Time   01/01/12  4:35 AM      Component Value Range Status Comment   C difficile by pcr NEGATIVE  NEGATIVE Final    Assessment: He is improving on empiric therapy for pneumonia. I will change piperacillin  tazobactam to oral levofloxacin along with oral linezolid. He needs one more day of therapy.  Plan: 1. Change piperacillin tazobactam to oral levofloxacin 750 mg for one dose.  2. Continue linezolid one more day 3. Please call if I can be of further assistance   Michel Bickers, MD [facility] for [place] [phone] pager   [phone] cell 01/01/2012, 4:02 PM

## 2012-01-02 ENCOUNTER — Other Ambulatory Visit: Payer: Self-pay | Admitting: Internal Medicine

## 2012-01-02 DIAGNOSIS — I6529 Occlusion and stenosis of unspecified carotid artery: Secondary | ICD-10-CM | POA: Diagnosis not present

## 2012-01-02 DIAGNOSIS — N170 Acute kidney failure with tubular necrosis: Secondary | ICD-10-CM | POA: Diagnosis not present

## 2012-01-02 DIAGNOSIS — J111 Influenza due to unidentified influenza virus with other respiratory manifestations: Secondary | ICD-10-CM | POA: Diagnosis not present

## 2012-01-02 DIAGNOSIS — D649 Anemia, unspecified: Secondary | ICD-10-CM | POA: Diagnosis not present

## 2012-01-02 LAB — CBC
HCT: 36.6 % — ABNORMAL LOW (ref 39.0–52.0)
Hemoglobin: 12.4 g/dL — ABNORMAL LOW (ref 13.0–17.0)
MCH: 29 pg (ref 26.0–34.0)
MCHC: 33.9 g/dL (ref 30.0–36.0)
MCV: 85.7 fL (ref 78.0–100.0)
Platelets: 334 10*3/uL (ref 150–400)
RBC: 4.27 MIL/uL (ref 4.22–5.81)
RDW: 15 % (ref 11.5–15.5)
WBC: 5.8 10*3/uL (ref 4.0–10.5)

## 2012-01-02 LAB — BASIC METABOLIC PANEL
BUN: 22 mg/dL (ref 6–23)
CO2: 25 mEq/L (ref 19–32)
Calcium: 9.1 mg/dL (ref 8.4–10.5)
Chloride: 101 mEq/L (ref 96–112)
Creatinine, Ser: 1.55 mg/dL — ABNORMAL HIGH (ref 0.50–1.35)
GFR calc Af Amer: 46 mL/min — ABNORMAL LOW (ref >90)
GFR calc non Af Amer: 40 mL/min — ABNORMAL LOW (ref >90)
Glucose, Bld: 95 mg/dL (ref 70–99)
Potassium: 4 mEq/L (ref 3.5–5.1)
Sodium: 136 mEq/L (ref 135–145)

## 2012-01-02 NOTE — Progress Notes (Signed)
Patient is on room air with clear and diminished breath sounds. Patient does not take any respiratory medications at home. RT will make patient xopenex PRN.

## 2012-01-02 NOTE — Progress Notes (Signed)
Pt refused to eat lunch

## 2012-01-02 NOTE — Discharge Summary (Signed)
Physician Discharge Summary  Alexander Mcdonald [id number] DOB: 18-Oct-1926 DOA: 12/25/2011  PCP: [name], MD  Admit date: 12/25/2011 Discharge date: 01/02/2012  Recommendations for Outpatient Follow-up:  1. Pt will need to follow up with PCP in 2-3 weeks post discharge 2. Please obtain BMP to evaluate electrolytes and kidney function 3. Please also check CBC to evaluate Hg and Hct levels  Discharge Diagnoses: Acute respiratory failure secondary to CAP Principal Problem:  *Pneumonia Active Problems:  Weakness  Fever  Renal failure (ARF), acute on chronic  HTN (hypertension)  Rhabdomyolysis  SIRS (systemic inflammatory response syndrome)  Discharge Condition: Stable  Diet recommendation: Heart healthy diet discussed in details   Brief narrative:  Pt is 78 year old male with history of hypertension, chronic kidney disease, gout, carotid artery disease and prostate cancer who was brought to the ER for further evaluation of progressive generalized weakness, shortness of breath, subjective fevers, chills. In ED, pt was found to be febrile with Tmax 102F and has was admitted for treatment of presumptive PNA.   Principal Problem:  *Pneumonia  - pt continues to clinically improve, he has completed 6 days of therapy with Zosyn and Linezolid  - Pt switched to oral Levaquin to complete the therapy, this will complete 7 days therapy - pt continues to maintain oxygen saturations at target range  ID has clear pt for discharged today Active Problems:  Weakness  - this is most likely secondary to #1 in addition to progressive failure to thrive and deconditioning  - will be discharged home with [facility] PT, RN, aid, orders placed  Fever  - secondary to PNA, now resolved  Renal failure (ARF), acute on chronic  - pre renal component imposed on chronic kidney failure  - creatinine is trending down  HTN (hypertension)  - reasonable control while inpatient  - will continue home  medication regimen: Norvasc and Lisinopril   Consultants:  ID Procedures/Studies:  CXR 12/10 --> Worsening bilateral mid and lower lung heterogeneous opacities, left greater than right, atelectasis versus infiltrate.  Antibiotics:  Linezolid - 6 days  Zosyn - 6 days  Levaquin 12/16 - discontinued 12/17 on the date of the discharge  Code Status: Full  Family Communication: Pt at bedside, daughter over the phone  Disposition Plan: Home with [facility]   Discharge Exam: Filed Vitals:   01/02/12 0649  BP: 142/52  Pulse:   Temp:   Resp:    Filed Vitals:   01/01/12 2330 01/02/12 0530 01/02/12 0552 01/02/12 0649  BP: 155/58 181/60 182/62 142/52  Pulse:  66    Temp:  97.5 F (36.4 C)    TempSrc:  Oral    Resp:  16    Height:      Weight:  67.767 kg (149 lb 6.4 oz)    SpO2:  98%      General: Pt is alert, follows commands appropriately, not in acute distress Cardiovascular: Regular rate and rhythm, S1/S2 +, no murmurs, no rubs, no gallops Respiratory: Clear to auscultation bilaterally, no wheezing, no crackles, no rhonchi Abdominal: Soft, non tender, non distended, bowel sounds +, no guarding Extremities: no edema, no cyanosis, pulses palpable bilaterally DP and PT Neuro: Grossly nonfocal  Discharge Instructions  Discharge Orders    Future Appointments: Provider: Department: Dept Phone: Center:   02/08/2012 10:00 AM Marletta Lor, MD [place] [facility]     Future Orders Please Complete By Expires   Diet - low sodium heart healthy  Increase activity slowly          Medication List     As of 01/02/2012 12:32 PM    TAKE these medications         amLODipine 5 MG tablet   Commonly known as: NORVASC   Take 5 mg by mouth daily.      aspirin EC 81 MG tablet   Take 81 mg by mouth daily.      esomeprazole 40 MG capsule   Commonly known as: NEXIUM   Take 40 mg by mouth daily before breakfast.      ezetimibe-simvastatin  10-20 MG per tablet   Commonly known as: VYTORIN   Take 1 tablet by mouth at bedtime.      fish oil-omega-3 fatty acids 1000 MG capsule   Take 1 g by mouth daily.      fosinopril 20 MG tablet   Commonly known as: MONOPRIL   Take 20 mg by mouth daily.      Tamsulosin HCl 0.4 MG Caps   Commonly known as: FLOMAX   Take 0.4 mg by mouth daily.      timolol 0.5 % ophthalmic solution   Commonly known as: BETIMOL   Place 1 drop into both eyes 2 (two) times daily.           Follow-up Information    Follow up with [name], MD. In 2 weeks.   Contact information:   [place] [address] [phone]           The results of significant diagnostics from this hospitalization (including imaging, microbiology, ancillary and laboratory) are listed below for reference.     Microbiology: Recent Results (from the past 240 hour(s))  CULTURE, BLOOD (ROUTINE X 2)     Status: Normal   Collection Time   12/25/11  2:10 AM      Component Value Range Status Comment   Specimen Description BLOOD BLOOD RIGHT FOREARM   Final    Special Requests BOTTLES DRAWN AEROBIC AND ANAEROBIC 2 CC EACH   Final    Culture  Setup Time 12/25/2011 10:04   Final    Culture NO GROWTH 5 DAYS   Final    Report Status 12/31/2011 FINAL   Final   CULTURE, BLOOD (ROUTINE X 2)     Status: Normal   Collection Time   12/25/11  2:10 AM      Component Value Range Status Comment   Specimen Description BLOOD RIGHT ANTECUBITAL   Final    Special Requests BOTTLES DRAWN AEROBIC AND ANAEROBIC 5 CC EACH   Final    Culture  Setup Time 12/25/2011 10:04   Final    Culture     Final    Value: STAPHYLOCOCCUS SPECIES (COAGULASE NEGATIVE)     Note: RIFAMPIN AND GENTAMICIN SHOULD NOT BE USED AS SINGLE DRUGS FOR TREATMENT OF STAPH INFECTIONS.     Note: Gram Stain Report Called to,Read Back By and Verified With: AMANDA THOMPSON 12/26/11 0830 BY SMITHERSJ   Report Status 12/29/2011 FINAL   Final     Organism ID, Bacteria STAPHYLOCOCCUS SPECIES (COAGULASE NEGATIVE)   Final   CULTURE, BLOOD (ROUTINE X 2)     Status: Normal   Collection Time   12/26/11  9:50 AM      Component Value Range Status Comment   Specimen Description BLOOD RIGHT ARM   Final    Special Requests BOTTLES DRAWN AEROBIC AND ANAEROBIC 5CC   Final  Culture  Setup Time 12/26/2011 20:54   Final    Culture NO GROWTH 5 DAYS   Final    Report Status 01/01/2012 FINAL   Final   CULTURE, BLOOD (ROUTINE X 2)     Status: Normal   Collection Time   12/26/11  9:55 AM      Component Value Range Status Comment   Specimen Description BLOOD RIGHT HAND   Final    Special Requests BOTTLES DRAWN AEROBIC ONLY 5CC   Final    Culture  Setup Time 12/26/2011 20:54   Final    Culture NO GROWTH 5 DAYS   Final    Report Status 01/01/2012 FINAL   Final   CULTURE, EXPECTORATED SPUTUM-ASSESSMENT     Status: Normal   Collection Time   12/27/11  6:41 PM      Component Value Range Status Comment   Specimen Description SPUTUM   Final    Special Requests NONE   Final    Sputum evaluation     Final    Value: THIS SPECIMEN IS ACCEPTABLE. RESPIRATORY CULTURE REPORT TO FOLLOW.   Report Status 12/27/2011 FINAL   Final   CULTURE, RESPIRATORY     Status: Normal   Collection Time   12/27/11  6:41 PM      Component Value Range Status Comment   Specimen Description SPUTUM   Final    Special Requests NONE   Final    Gram Stain     Final    Value: MODERATE WBC PRESENT,BOTH PMN AND MONONUCLEAR     RARE SQUAMOUS EPITHELIAL CELLS PRESENT     NO ORGANISMS SEEN   Culture NORMAL OROPHARYNGEAL FLORA   Final    Report Status 12/30/2011 FINAL   Final   CLOSTRIDIUM DIFFICILE BY PCR     Status: Normal   Collection Time   01/01/12  4:35 AM      Component Value Range Status Comment   C difficile by pcr NEGATIVE  NEGATIVE Final      Labs: Basic Metabolic Panel:  Lab [id number] 12/30/11 0526 12/29/11 0450  NA 136 131* 129*  K 4.0 4.0 4.1  CL 101 97  95*  CO2 25 23 24   GLUCOSE 95 97 111*  BUN 22 35* 38*  CREATININE 1.55* 1.63* 2.03*  CALCIUM 9.1 8.6 8.5  MG -- -- --  PHOS -- -- --   CBC:  Lab 01/02/12 0520  WBC 5.8  NEUTROABS --  HGB 12.4*  HCT 36.6*  MCV 85.7  PLT 334    SIGNED: Time coordinating discharge: Over 30 minutes  Faye Ramsay, MD  [facility] 01/02/2012, 12:32 PM Pager [phone]  If 7PM-7AM, please contact night-coverage [email or web address] Password [email or web address]

## 2012-01-02 NOTE — Progress Notes (Signed)
Speech Language Pathology Dysphagia Treatment Patient Details Name: Alexander Mcdonald MRN: [id number] DOB: 04-02-1926 Today's Date: 01/02/2012 Time: [id number] SLP Time Calculation (min): 35 min  Assessment / Plan / Recommendation Clinical Impression  SLP follow up regarding pt's multfactorial dysphagia, SLP reviewed MBS and purpose of compensatory strategies including dry swallows and following liquids with heavier consistencies.  Pt denies overt dysphagia but readily admits voice is not at baseline strength.  Exercises demonstrated by SLP and provided in writing.  Pt verbalized understanding to information provided.  When inquired about dyspnea with po, pt stated "I'm short of breath all the time".  Advised to precautions to maximize airway protection and left in writing.  As this is reportedly pt's first pna, advise to monitor swallow function and order repeat testing if indicated in future.    Thanks!  -    Diet Recommendation  Continue with Current Diet: Regular;Thin liquid    SLP Plan     Pertinent Vitals/Pain Afebrile, decreased   Swallowing Goals  SLP Swallowing Goals Swallow Study Goal #1 - Progress: Progressing toward goal Swallow Study Goal #3 - Progress: Progressing toward goal  General Temperature Spikes Noted: No Respiratory Status: Room air Behavior/Cognition: Alert;Cooperative;Pleasant mood (pt ready to dc home)  Oral Cavity - Oral Hygiene     Dysphagia Treatment Treatment focused on: Patient/family/caregiver education;Other (comment) (oropharyngeal exercises) Family/Caregiver Educated: pt Treatment Methods/Modalities: Effortful swallow;Other (comment) (shaker head lift) Reason PO's not observed: Other (comment) (education session re: MBS results, compensatory strategies) Feeding: Able to feed self   Alexander Mcdonald, Alexander Mcdonald [facility] SLP [phone]

## 2012-01-04 DIAGNOSIS — F329 Major depressive disorder, single episode, unspecified: Secondary | ICD-10-CM | POA: Diagnosis not present

## 2012-01-04 DIAGNOSIS — N189 Chronic kidney disease, unspecified: Secondary | ICD-10-CM | POA: Diagnosis not present

## 2012-01-04 DIAGNOSIS — J159 Unspecified bacterial pneumonia: Secondary | ICD-10-CM | POA: Diagnosis not present

## 2012-01-04 DIAGNOSIS — R269 Unspecified abnormalities of gait and mobility: Secondary | ICD-10-CM | POA: Diagnosis not present

## 2012-01-04 DIAGNOSIS — I129 Hypertensive chronic kidney disease with stage 1 through stage 4 chronic kidney disease, or unspecified chronic kidney disease: Secondary | ICD-10-CM | POA: Diagnosis not present

## 2012-01-08 DIAGNOSIS — R269 Unspecified abnormalities of gait and mobility: Secondary | ICD-10-CM | POA: Diagnosis not present

## 2012-01-08 DIAGNOSIS — J159 Unspecified bacterial pneumonia: Secondary | ICD-10-CM | POA: Diagnosis not present

## 2012-01-08 DIAGNOSIS — F329 Major depressive disorder, single episode, unspecified: Secondary | ICD-10-CM | POA: Diagnosis not present

## 2012-01-08 DIAGNOSIS — N189 Chronic kidney disease, unspecified: Secondary | ICD-10-CM | POA: Diagnosis not present

## 2012-01-08 DIAGNOSIS — I129 Hypertensive chronic kidney disease with stage 1 through stage 4 chronic kidney disease, or unspecified chronic kidney disease: Secondary | ICD-10-CM | POA: Diagnosis not present

## 2012-01-09 DIAGNOSIS — J159 Unspecified bacterial pneumonia: Secondary | ICD-10-CM | POA: Diagnosis not present

## 2012-01-09 DIAGNOSIS — R269 Unspecified abnormalities of gait and mobility: Secondary | ICD-10-CM | POA: Diagnosis not present

## 2012-01-09 DIAGNOSIS — F329 Major depressive disorder, single episode, unspecified: Secondary | ICD-10-CM | POA: Diagnosis not present

## 2012-01-09 DIAGNOSIS — I129 Hypertensive chronic kidney disease with stage 1 through stage 4 chronic kidney disease, or unspecified chronic kidney disease: Secondary | ICD-10-CM | POA: Diagnosis not present

## 2012-01-09 DIAGNOSIS — N189 Chronic kidney disease, unspecified: Secondary | ICD-10-CM | POA: Diagnosis not present

## 2012-01-11 DIAGNOSIS — R269 Unspecified abnormalities of gait and mobility: Secondary | ICD-10-CM | POA: Diagnosis not present

## 2012-01-11 DIAGNOSIS — I129 Hypertensive chronic kidney disease with stage 1 through stage 4 chronic kidney disease, or unspecified chronic kidney disease: Secondary | ICD-10-CM | POA: Diagnosis not present

## 2012-01-11 DIAGNOSIS — J159 Unspecified bacterial pneumonia: Secondary | ICD-10-CM | POA: Diagnosis not present

## 2012-01-11 DIAGNOSIS — N189 Chronic kidney disease, unspecified: Secondary | ICD-10-CM | POA: Diagnosis not present

## 2012-01-11 DIAGNOSIS — F329 Major depressive disorder, single episode, unspecified: Secondary | ICD-10-CM | POA: Diagnosis not present

## 2012-01-13 DIAGNOSIS — F329 Major depressive disorder, single episode, unspecified: Secondary | ICD-10-CM | POA: Diagnosis not present

## 2012-01-13 DIAGNOSIS — R269 Unspecified abnormalities of gait and mobility: Secondary | ICD-10-CM | POA: Diagnosis not present

## 2012-01-13 DIAGNOSIS — J159 Unspecified bacterial pneumonia: Secondary | ICD-10-CM | POA: Diagnosis not present

## 2012-01-13 DIAGNOSIS — I129 Hypertensive chronic kidney disease with stage 1 through stage 4 chronic kidney disease, or unspecified chronic kidney disease: Secondary | ICD-10-CM | POA: Diagnosis not present

## 2012-01-13 DIAGNOSIS — N189 Chronic kidney disease, unspecified: Secondary | ICD-10-CM | POA: Diagnosis not present

## 2012-01-15 DIAGNOSIS — I129 Hypertensive chronic kidney disease with stage 1 through stage 4 chronic kidney disease, or unspecified chronic kidney disease: Secondary | ICD-10-CM | POA: Diagnosis not present

## 2012-01-15 DIAGNOSIS — R269 Unspecified abnormalities of gait and mobility: Secondary | ICD-10-CM | POA: Diagnosis not present

## 2012-01-15 DIAGNOSIS — F329 Major depressive disorder, single episode, unspecified: Secondary | ICD-10-CM | POA: Diagnosis not present

## 2012-01-15 DIAGNOSIS — N189 Chronic kidney disease, unspecified: Secondary | ICD-10-CM | POA: Diagnosis not present

## 2012-01-15 DIAGNOSIS — J159 Unspecified bacterial pneumonia: Secondary | ICD-10-CM | POA: Diagnosis not present

## 2012-01-16 DIAGNOSIS — R269 Unspecified abnormalities of gait and mobility: Secondary | ICD-10-CM | POA: Diagnosis not present

## 2012-01-16 DIAGNOSIS — F329 Major depressive disorder, single episode, unspecified: Secondary | ICD-10-CM | POA: Diagnosis not present

## 2012-01-16 DIAGNOSIS — I129 Hypertensive chronic kidney disease with stage 1 through stage 4 chronic kidney disease, or unspecified chronic kidney disease: Secondary | ICD-10-CM | POA: Diagnosis not present

## 2012-01-16 DIAGNOSIS — N189 Chronic kidney disease, unspecified: Secondary | ICD-10-CM | POA: Diagnosis not present

## 2012-01-16 DIAGNOSIS — J159 Unspecified bacterial pneumonia: Secondary | ICD-10-CM | POA: Diagnosis not present

## 2012-01-18 DIAGNOSIS — I129 Hypertensive chronic kidney disease with stage 1 through stage 4 chronic kidney disease, or unspecified chronic kidney disease: Secondary | ICD-10-CM | POA: Diagnosis not present

## 2012-01-18 DIAGNOSIS — J159 Unspecified bacterial pneumonia: Secondary | ICD-10-CM | POA: Diagnosis not present

## 2012-01-18 DIAGNOSIS — R269 Unspecified abnormalities of gait and mobility: Secondary | ICD-10-CM | POA: Diagnosis not present

## 2012-01-18 DIAGNOSIS — F329 Major depressive disorder, single episode, unspecified: Secondary | ICD-10-CM | POA: Diagnosis not present

## 2012-01-18 DIAGNOSIS — N189 Chronic kidney disease, unspecified: Secondary | ICD-10-CM | POA: Diagnosis not present

## 2012-01-19 DIAGNOSIS — J159 Unspecified bacterial pneumonia: Secondary | ICD-10-CM | POA: Diagnosis not present

## 2012-01-19 DIAGNOSIS — N189 Chronic kidney disease, unspecified: Secondary | ICD-10-CM | POA: Diagnosis not present

## 2012-01-19 DIAGNOSIS — R269 Unspecified abnormalities of gait and mobility: Secondary | ICD-10-CM | POA: Diagnosis not present

## 2012-01-19 DIAGNOSIS — I129 Hypertensive chronic kidney disease with stage 1 through stage 4 chronic kidney disease, or unspecified chronic kidney disease: Secondary | ICD-10-CM | POA: Diagnosis not present

## 2012-01-19 DIAGNOSIS — F329 Major depressive disorder, single episode, unspecified: Secondary | ICD-10-CM | POA: Diagnosis not present

## 2012-01-22 DIAGNOSIS — I129 Hypertensive chronic kidney disease with stage 1 through stage 4 chronic kidney disease, or unspecified chronic kidney disease: Secondary | ICD-10-CM | POA: Diagnosis not present

## 2012-01-22 DIAGNOSIS — J159 Unspecified bacterial pneumonia: Secondary | ICD-10-CM | POA: Diagnosis not present

## 2012-01-22 DIAGNOSIS — R269 Unspecified abnormalities of gait and mobility: Secondary | ICD-10-CM | POA: Diagnosis not present

## 2012-01-22 DIAGNOSIS — N189 Chronic kidney disease, unspecified: Secondary | ICD-10-CM | POA: Diagnosis not present

## 2012-01-22 DIAGNOSIS — F329 Major depressive disorder, single episode, unspecified: Secondary | ICD-10-CM | POA: Diagnosis not present

## 2012-01-23 DIAGNOSIS — F329 Major depressive disorder, single episode, unspecified: Secondary | ICD-10-CM | POA: Diagnosis not present

## 2012-01-23 DIAGNOSIS — J159 Unspecified bacterial pneumonia: Secondary | ICD-10-CM | POA: Diagnosis not present

## 2012-01-23 DIAGNOSIS — I129 Hypertensive chronic kidney disease with stage 1 through stage 4 chronic kidney disease, or unspecified chronic kidney disease: Secondary | ICD-10-CM | POA: Diagnosis not present

## 2012-01-23 DIAGNOSIS — N189 Chronic kidney disease, unspecified: Secondary | ICD-10-CM | POA: Diagnosis not present

## 2012-01-23 DIAGNOSIS — R269 Unspecified abnormalities of gait and mobility: Secondary | ICD-10-CM | POA: Diagnosis not present

## 2012-01-24 DIAGNOSIS — N189 Chronic kidney disease, unspecified: Secondary | ICD-10-CM | POA: Diagnosis not present

## 2012-01-24 DIAGNOSIS — I129 Hypertensive chronic kidney disease with stage 1 through stage 4 chronic kidney disease, or unspecified chronic kidney disease: Secondary | ICD-10-CM | POA: Diagnosis not present

## 2012-01-24 DIAGNOSIS — J159 Unspecified bacterial pneumonia: Secondary | ICD-10-CM | POA: Diagnosis not present

## 2012-01-24 DIAGNOSIS — F329 Major depressive disorder, single episode, unspecified: Secondary | ICD-10-CM | POA: Diagnosis not present

## 2012-01-24 DIAGNOSIS — R269 Unspecified abnormalities of gait and mobility: Secondary | ICD-10-CM | POA: Diagnosis not present

## 2012-01-25 DIAGNOSIS — I129 Hypertensive chronic kidney disease with stage 1 through stage 4 chronic kidney disease, or unspecified chronic kidney disease: Secondary | ICD-10-CM | POA: Diagnosis not present

## 2012-01-25 DIAGNOSIS — R269 Unspecified abnormalities of gait and mobility: Secondary | ICD-10-CM | POA: Diagnosis not present

## 2012-01-25 DIAGNOSIS — F329 Major depressive disorder, single episode, unspecified: Secondary | ICD-10-CM | POA: Diagnosis not present

## 2012-01-25 DIAGNOSIS — N189 Chronic kidney disease, unspecified: Secondary | ICD-10-CM | POA: Diagnosis not present

## 2012-01-25 DIAGNOSIS — J159 Unspecified bacterial pneumonia: Secondary | ICD-10-CM | POA: Diagnosis not present

## 2012-01-30 ENCOUNTER — Other Ambulatory Visit: Payer: Self-pay | Admitting: Internal Medicine

## 2012-01-30 DIAGNOSIS — F329 Major depressive disorder, single episode, unspecified: Secondary | ICD-10-CM | POA: Diagnosis not present

## 2012-01-30 DIAGNOSIS — J159 Unspecified bacterial pneumonia: Secondary | ICD-10-CM | POA: Diagnosis not present

## 2012-01-30 DIAGNOSIS — R269 Unspecified abnormalities of gait and mobility: Secondary | ICD-10-CM | POA: Diagnosis not present

## 2012-01-30 DIAGNOSIS — N189 Chronic kidney disease, unspecified: Secondary | ICD-10-CM | POA: Diagnosis not present

## 2012-01-30 DIAGNOSIS — I129 Hypertensive chronic kidney disease with stage 1 through stage 4 chronic kidney disease, or unspecified chronic kidney disease: Secondary | ICD-10-CM | POA: Diagnosis not present

## 2012-02-05 DIAGNOSIS — I129 Hypertensive chronic kidney disease with stage 1 through stage 4 chronic kidney disease, or unspecified chronic kidney disease: Secondary | ICD-10-CM | POA: Diagnosis not present

## 2012-02-05 DIAGNOSIS — J159 Unspecified bacterial pneumonia: Secondary | ICD-10-CM | POA: Diagnosis not present

## 2012-02-05 DIAGNOSIS — F329 Major depressive disorder, single episode, unspecified: Secondary | ICD-10-CM | POA: Diagnosis not present

## 2012-02-05 DIAGNOSIS — R269 Unspecified abnormalities of gait and mobility: Secondary | ICD-10-CM | POA: Diagnosis not present

## 2012-02-05 DIAGNOSIS — N189 Chronic kidney disease, unspecified: Secondary | ICD-10-CM | POA: Diagnosis not present

## 2012-02-08 ENCOUNTER — Ambulatory Visit: Payer: Medicare Other | Admitting: Internal Medicine

## 2012-02-13 DIAGNOSIS — N401 Enlarged prostate with lower urinary tract symptoms: Secondary | ICD-10-CM | POA: Diagnosis not present

## 2012-02-15 ENCOUNTER — Ambulatory Visit: Payer: Medicare Other | Admitting: Internal Medicine

## 2012-02-20 DIAGNOSIS — R3915 Urgency of urination: Secondary | ICD-10-CM | POA: Diagnosis not present

## 2012-02-20 DIAGNOSIS — R351 Nocturia: Secondary | ICD-10-CM | POA: Diagnosis not present

## 2012-02-20 DIAGNOSIS — C61 Malignant neoplasm of prostate: Secondary | ICD-10-CM | POA: Diagnosis not present

## 2012-02-20 DIAGNOSIS — R82998 Other abnormal findings in urine: Secondary | ICD-10-CM | POA: Diagnosis not present

## 2012-02-23 DIAGNOSIS — H35359 Cystoid macular degeneration, unspecified eye: Secondary | ICD-10-CM | POA: Diagnosis not present

## 2012-02-23 DIAGNOSIS — H3530 Unspecified macular degeneration: Secondary | ICD-10-CM | POA: Diagnosis not present

## 2012-02-23 DIAGNOSIS — H35039 Hypertensive retinopathy, unspecified eye: Secondary | ICD-10-CM | POA: Diagnosis not present

## 2012-02-23 DIAGNOSIS — H348392 Tributary (branch) retinal vein occlusion, unspecified eye, stable: Secondary | ICD-10-CM | POA: Diagnosis not present

## 2012-02-23 DIAGNOSIS — H35059 Retinal neovascularization, unspecified, unspecified eye: Secondary | ICD-10-CM | POA: Diagnosis not present

## 2012-02-23 DIAGNOSIS — H409 Unspecified glaucoma: Secondary | ICD-10-CM | POA: Diagnosis not present

## 2012-02-23 DIAGNOSIS — H348192 Central retinal vein occlusion, unspecified eye, stable: Secondary | ICD-10-CM | POA: Diagnosis not present

## 2012-02-23 DIAGNOSIS — H4011X0 Primary open-angle glaucoma, stage unspecified: Secondary | ICD-10-CM | POA: Diagnosis not present

## 2012-03-01 ENCOUNTER — Ambulatory Visit: Payer: Medicare Other | Admitting: Internal Medicine

## 2012-03-04 DIAGNOSIS — I129 Hypertensive chronic kidney disease with stage 1 through stage 4 chronic kidney disease, or unspecified chronic kidney disease: Secondary | ICD-10-CM | POA: Diagnosis not present

## 2012-03-04 DIAGNOSIS — I10 Essential (primary) hypertension: Secondary | ICD-10-CM | POA: Diagnosis not present

## 2012-03-04 DIAGNOSIS — N2581 Secondary hyperparathyroidism of renal origin: Secondary | ICD-10-CM | POA: Diagnosis not present

## 2012-03-04 DIAGNOSIS — N183 Chronic kidney disease, stage 3 unspecified: Secondary | ICD-10-CM | POA: Diagnosis not present

## 2012-03-04 DIAGNOSIS — D649 Anemia, unspecified: Secondary | ICD-10-CM | POA: Diagnosis not present

## 2012-03-12 DIAGNOSIS — H35039 Hypertensive retinopathy, unspecified eye: Secondary | ICD-10-CM | POA: Diagnosis not present

## 2012-03-12 DIAGNOSIS — H35059 Retinal neovascularization, unspecified, unspecified eye: Secondary | ICD-10-CM | POA: Diagnosis not present

## 2012-03-12 DIAGNOSIS — H3581 Retinal edema: Secondary | ICD-10-CM | POA: Diagnosis not present

## 2012-03-12 DIAGNOSIS — H3530 Unspecified macular degeneration: Secondary | ICD-10-CM | POA: Diagnosis not present

## 2012-03-12 DIAGNOSIS — H348392 Tributary (branch) retinal vein occlusion, unspecified eye, stable: Secondary | ICD-10-CM | POA: Diagnosis not present

## 2012-03-12 DIAGNOSIS — H4011X0 Primary open-angle glaucoma, stage unspecified: Secondary | ICD-10-CM | POA: Diagnosis not present

## 2012-03-12 DIAGNOSIS — H35359 Cystoid macular degeneration, unspecified eye: Secondary | ICD-10-CM | POA: Diagnosis not present

## 2012-03-12 DIAGNOSIS — H348192 Central retinal vein occlusion, unspecified eye, stable: Secondary | ICD-10-CM | POA: Diagnosis not present

## 2012-03-26 ENCOUNTER — Encounter: Payer: Self-pay | Admitting: Internal Medicine

## 2012-03-26 DIAGNOSIS — H35059 Retinal neovascularization, unspecified, unspecified eye: Secondary | ICD-10-CM | POA: Diagnosis not present

## 2012-03-26 DIAGNOSIS — H4011X0 Primary open-angle glaucoma, stage unspecified: Secondary | ICD-10-CM | POA: Diagnosis not present

## 2012-03-26 DIAGNOSIS — H409 Unspecified glaucoma: Secondary | ICD-10-CM | POA: Diagnosis not present

## 2012-03-26 DIAGNOSIS — H348392 Tributary (branch) retinal vein occlusion, unspecified eye, stable: Secondary | ICD-10-CM | POA: Diagnosis not present

## 2012-03-26 DIAGNOSIS — H35359 Cystoid macular degeneration, unspecified eye: Secondary | ICD-10-CM | POA: Diagnosis not present

## 2012-03-26 DIAGNOSIS — H35039 Hypertensive retinopathy, unspecified eye: Secondary | ICD-10-CM | POA: Diagnosis not present

## 2012-03-29 DIAGNOSIS — R350 Frequency of micturition: Secondary | ICD-10-CM | POA: Diagnosis not present

## 2012-03-29 DIAGNOSIS — R82998 Other abnormal findings in urine: Secondary | ICD-10-CM | POA: Diagnosis not present

## 2012-03-29 DIAGNOSIS — R351 Nocturia: Secondary | ICD-10-CM | POA: Diagnosis not present

## 2012-03-29 DIAGNOSIS — R3915 Urgency of urination: Secondary | ICD-10-CM | POA: Diagnosis not present

## 2012-04-03 ENCOUNTER — Other Ambulatory Visit: Payer: Self-pay | Admitting: Internal Medicine

## 2012-04-08 ENCOUNTER — Telehealth: Payer: Self-pay | Admitting: Internal Medicine

## 2012-04-08 NOTE — Telephone Encounter (Signed)
Pt is following up on request for prior approval on NEXIUM 40 MG capsule. Pt would like return call about status.Pt says pharm gave him 4 pills to get him through and now he has one.

## 2012-04-09 NOTE — Telephone Encounter (Signed)
Called pt back in [place] to his Nexium. Pt does not need a prior Auth. Pt recently switched insurance plans has Medicare part D, under this plan pt has to meet a 600 dollar deductable. Until he meets this deductable he will have to pay 100% of the medication price

## 2012-04-10 ENCOUNTER — Telehealth: Payer: Self-pay | Admitting: *Deleted

## 2012-04-10 NOTE — Telephone Encounter (Signed)
Called pt back in [place] to his Nexium. Spoke to pt told him  does not need a prior Auth. You recently switched insurance plans has Medicare part D, under this plan have to meet a 600 dollar deductable. Until meets this deductable will have to pay 100% of the medication price. Pt verbalized understanding, spoke to insurance last evening and has decided not to take for now not sure he needs it. Told pt that is up to him but has been taking it daily so if develops heartburn or reflux issues needs to take again. Pt verbalized understanding.

## 2012-04-11 ENCOUNTER — Other Ambulatory Visit: Payer: Self-pay | Admitting: *Deleted

## 2012-04-11 MED ORDER — TAMSULOSIN HCL 0.4 MG PO CAPS: 0.4000 mg | ORAL_CAPSULE | Freq: Every day | ORAL | Status: DC

## 2012-04-26 DIAGNOSIS — R351 Nocturia: Secondary | ICD-10-CM | POA: Diagnosis not present

## 2012-04-26 DIAGNOSIS — R350 Frequency of micturition: Secondary | ICD-10-CM | POA: Diagnosis not present

## 2012-04-26 DIAGNOSIS — R3915 Urgency of urination: Secondary | ICD-10-CM | POA: Diagnosis not present

## 2012-04-29 DIAGNOSIS — H348392 Tributary (branch) retinal vein occlusion, unspecified eye, stable: Secondary | ICD-10-CM | POA: Diagnosis not present

## 2012-04-29 DIAGNOSIS — H35039 Hypertensive retinopathy, unspecified eye: Secondary | ICD-10-CM | POA: Diagnosis not present

## 2012-04-29 DIAGNOSIS — H4011X0 Primary open-angle glaucoma, stage unspecified: Secondary | ICD-10-CM | POA: Diagnosis not present

## 2012-06-18 DIAGNOSIS — H348392 Tributary (branch) retinal vein occlusion, unspecified eye, stable: Secondary | ICD-10-CM | POA: Diagnosis not present

## 2012-06-18 DIAGNOSIS — H35359 Cystoid macular degeneration, unspecified eye: Secondary | ICD-10-CM | POA: Diagnosis not present

## 2012-06-18 DIAGNOSIS — H348192 Central retinal vein occlusion, unspecified eye, stable: Secondary | ICD-10-CM | POA: Diagnosis not present

## 2012-06-18 DIAGNOSIS — H0230 Blepharochalasis unspecified eye, unspecified eyelid: Secondary | ICD-10-CM | POA: Diagnosis not present

## 2012-06-20 ENCOUNTER — Other Ambulatory Visit: Payer: Self-pay | Admitting: Internal Medicine

## 2012-07-30 DIAGNOSIS — H409 Unspecified glaucoma: Secondary | ICD-10-CM | POA: Diagnosis not present

## 2012-07-30 DIAGNOSIS — H35359 Cystoid macular degeneration, unspecified eye: Secondary | ICD-10-CM | POA: Diagnosis not present

## 2012-07-30 DIAGNOSIS — H348192 Central retinal vein occlusion, unspecified eye, stable: Secondary | ICD-10-CM | POA: Diagnosis not present

## 2012-07-30 DIAGNOSIS — H4011X0 Primary open-angle glaucoma, stage unspecified: Secondary | ICD-10-CM | POA: Diagnosis not present

## 2012-07-30 DIAGNOSIS — H35039 Hypertensive retinopathy, unspecified eye: Secondary | ICD-10-CM | POA: Diagnosis not present

## 2012-07-30 DIAGNOSIS — H35059 Retinal neovascularization, unspecified, unspecified eye: Secondary | ICD-10-CM | POA: Diagnosis not present

## 2012-07-30 DIAGNOSIS — H348392 Tributary (branch) retinal vein occlusion, unspecified eye, stable: Secondary | ICD-10-CM | POA: Diagnosis not present

## 2012-07-30 DIAGNOSIS — H3530 Unspecified macular degeneration: Secondary | ICD-10-CM | POA: Diagnosis not present

## 2012-08-16 DIAGNOSIS — R350 Frequency of micturition: Secondary | ICD-10-CM | POA: Diagnosis not present

## 2012-08-16 DIAGNOSIS — R351 Nocturia: Secondary | ICD-10-CM | POA: Diagnosis not present

## 2012-08-16 DIAGNOSIS — R82998 Other abnormal findings in urine: Secondary | ICD-10-CM | POA: Diagnosis not present

## 2012-08-16 DIAGNOSIS — R112 Nausea with vomiting, unspecified: Secondary | ICD-10-CM | POA: Diagnosis not present

## 2012-08-19 DIAGNOSIS — N390 Urinary tract infection, site not specified: Secondary | ICD-10-CM | POA: Diagnosis not present

## 2012-08-19 DIAGNOSIS — C61 Malignant neoplasm of prostate: Secondary | ICD-10-CM | POA: Diagnosis not present

## 2012-08-19 DIAGNOSIS — R350 Frequency of micturition: Secondary | ICD-10-CM | POA: Diagnosis not present

## 2012-08-19 DIAGNOSIS — R351 Nocturia: Secondary | ICD-10-CM | POA: Diagnosis not present

## 2012-08-20 DIAGNOSIS — H353290 Exudative age-related macular degeneration, unspecified eye, stage unspecified: Secondary | ICD-10-CM | POA: Diagnosis not present

## 2012-08-20 DIAGNOSIS — H35359 Cystoid macular degeneration, unspecified eye: Secondary | ICD-10-CM | POA: Diagnosis not present

## 2012-08-20 DIAGNOSIS — H348392 Tributary (branch) retinal vein occlusion, unspecified eye, stable: Secondary | ICD-10-CM | POA: Diagnosis not present

## 2012-09-18 ENCOUNTER — Other Ambulatory Visit: Payer: Self-pay | Admitting: Internal Medicine

## 2012-09-24 DIAGNOSIS — H35359 Cystoid macular degeneration, unspecified eye: Secondary | ICD-10-CM | POA: Diagnosis not present

## 2012-09-24 DIAGNOSIS — H348392 Tributary (branch) retinal vein occlusion, unspecified eye, stable: Secondary | ICD-10-CM | POA: Diagnosis not present

## 2012-09-24 DIAGNOSIS — H353290 Exudative age-related macular degeneration, unspecified eye, stage unspecified: Secondary | ICD-10-CM | POA: Diagnosis not present

## 2012-09-27 DIAGNOSIS — C61 Malignant neoplasm of prostate: Secondary | ICD-10-CM | POA: Diagnosis not present

## 2012-10-04 DIAGNOSIS — C61 Malignant neoplasm of prostate: Secondary | ICD-10-CM | POA: Diagnosis not present

## 2012-10-10 ENCOUNTER — Other Ambulatory Visit: Payer: Self-pay | Admitting: Internal Medicine

## 2012-10-15 DIAGNOSIS — H4011X0 Primary open-angle glaucoma, stage unspecified: Secondary | ICD-10-CM | POA: Diagnosis not present

## 2012-10-15 DIAGNOSIS — H35359 Cystoid macular degeneration, unspecified eye: Secondary | ICD-10-CM | POA: Diagnosis not present

## 2012-10-15 DIAGNOSIS — H348392 Tributary (branch) retinal vein occlusion, unspecified eye, stable: Secondary | ICD-10-CM | POA: Diagnosis not present

## 2012-10-15 DIAGNOSIS — H348192 Central retinal vein occlusion, unspecified eye, stable: Secondary | ICD-10-CM | POA: Diagnosis not present

## 2012-10-15 DIAGNOSIS — H353190 Nonexudative age-related macular degeneration, unspecified eye, stage unspecified: Secondary | ICD-10-CM | POA: Diagnosis not present

## 2012-10-15 DIAGNOSIS — H35059 Retinal neovascularization, unspecified, unspecified eye: Secondary | ICD-10-CM | POA: Diagnosis not present

## 2012-10-15 DIAGNOSIS — H3530 Unspecified macular degeneration: Secondary | ICD-10-CM | POA: Diagnosis not present

## 2012-10-15 DIAGNOSIS — H3581 Retinal edema: Secondary | ICD-10-CM | POA: Diagnosis not present

## 2012-10-30 DIAGNOSIS — N390 Urinary tract infection, site not specified: Secondary | ICD-10-CM | POA: Diagnosis not present

## 2012-10-30 DIAGNOSIS — N401 Enlarged prostate with lower urinary tract symptoms: Secondary | ICD-10-CM | POA: Diagnosis not present

## 2012-11-13 DIAGNOSIS — N390 Urinary tract infection, site not specified: Secondary | ICD-10-CM | POA: Diagnosis not present

## 2012-11-18 DIAGNOSIS — H4011X0 Primary open-angle glaucoma, stage unspecified: Secondary | ICD-10-CM | POA: Diagnosis not present

## 2012-11-18 DIAGNOSIS — H409 Unspecified glaucoma: Secondary | ICD-10-CM | POA: Diagnosis not present

## 2012-11-18 DIAGNOSIS — H348392 Tributary (branch) retinal vein occlusion, unspecified eye, stable: Secondary | ICD-10-CM | POA: Diagnosis not present

## 2012-11-18 DIAGNOSIS — H35039 Hypertensive retinopathy, unspecified eye: Secondary | ICD-10-CM | POA: Diagnosis not present

## 2012-11-19 ENCOUNTER — Other Ambulatory Visit: Payer: Self-pay | Admitting: Internal Medicine

## 2012-11-21 ENCOUNTER — Other Ambulatory Visit: Payer: Self-pay

## 2012-12-19 ENCOUNTER — Other Ambulatory Visit: Payer: Self-pay | Admitting: Internal Medicine

## 2012-12-20 DIAGNOSIS — H35059 Retinal neovascularization, unspecified, unspecified eye: Secondary | ICD-10-CM | POA: Diagnosis not present

## 2012-12-20 DIAGNOSIS — H348392 Tributary (branch) retinal vein occlusion, unspecified eye, stable: Secondary | ICD-10-CM | POA: Diagnosis not present

## 2012-12-20 DIAGNOSIS — H348192 Central retinal vein occlusion, unspecified eye, stable: Secondary | ICD-10-CM | POA: Diagnosis not present

## 2012-12-20 DIAGNOSIS — H409 Unspecified glaucoma: Secondary | ICD-10-CM | POA: Diagnosis not present

## 2012-12-20 DIAGNOSIS — H3581 Retinal edema: Secondary | ICD-10-CM | POA: Diagnosis not present

## 2012-12-20 DIAGNOSIS — H4011X0 Primary open-angle glaucoma, stage unspecified: Secondary | ICD-10-CM | POA: Diagnosis not present

## 2013-01-03 ENCOUNTER — Other Ambulatory Visit: Payer: Self-pay | Admitting: Internal Medicine

## 2013-01-06 ENCOUNTER — Other Ambulatory Visit: Payer: Self-pay | Admitting: Internal Medicine

## 2013-01-31 DIAGNOSIS — H353290 Exudative age-related macular degeneration, unspecified eye, stage unspecified: Secondary | ICD-10-CM | POA: Diagnosis not present

## 2013-02-21 ENCOUNTER — Other Ambulatory Visit: Payer: Self-pay | Admitting: Internal Medicine

## 2013-03-21 DIAGNOSIS — H348392 Tributary (branch) retinal vein occlusion, unspecified eye, stable: Secondary | ICD-10-CM | POA: Diagnosis not present

## 2013-03-26 DIAGNOSIS — N390 Urinary tract infection, site not specified: Secondary | ICD-10-CM | POA: Diagnosis not present

## 2013-04-02 DIAGNOSIS — N390 Urinary tract infection, site not specified: Secondary | ICD-10-CM | POA: Diagnosis not present

## 2013-04-04 DIAGNOSIS — N139 Obstructive and reflux uropathy, unspecified: Secondary | ICD-10-CM | POA: Diagnosis not present

## 2013-04-04 DIAGNOSIS — C61 Malignant neoplasm of prostate: Secondary | ICD-10-CM | POA: Diagnosis not present

## 2013-04-04 DIAGNOSIS — N401 Enlarged prostate with lower urinary tract symptoms: Secondary | ICD-10-CM | POA: Diagnosis not present

## 2013-04-04 DIAGNOSIS — N138 Other obstructive and reflux uropathy: Secondary | ICD-10-CM | POA: Diagnosis not present

## 2013-04-04 DIAGNOSIS — N3020 Other chronic cystitis without hematuria: Secondary | ICD-10-CM | POA: Diagnosis not present

## 2013-04-04 DIAGNOSIS — N289 Disorder of kidney and ureter, unspecified: Secondary | ICD-10-CM | POA: Diagnosis not present

## 2013-04-05 ENCOUNTER — Other Ambulatory Visit: Payer: Self-pay | Admitting: Internal Medicine

## 2013-04-08 DIAGNOSIS — H348392 Tributary (branch) retinal vein occlusion, unspecified eye, stable: Secondary | ICD-10-CM | POA: Diagnosis not present

## 2013-04-08 DIAGNOSIS — H3581 Retinal edema: Secondary | ICD-10-CM | POA: Diagnosis not present

## 2013-04-10 ENCOUNTER — Encounter: Payer: Self-pay | Admitting: Infectious Disease

## 2013-04-10 ENCOUNTER — Ambulatory Visit (INDEPENDENT_AMBULATORY_CARE_PROVIDER_SITE_OTHER): Payer: Medicare Other | Admitting: Infectious Disease

## 2013-04-10 ENCOUNTER — Other Ambulatory Visit: Payer: Self-pay | Admitting: Internal Medicine

## 2013-04-10 ENCOUNTER — Telehealth: Payer: Self-pay | Admitting: Internal Medicine

## 2013-04-10 VITALS — BP 153/65 | HR 65 | Temp 98.1°F | Ht 65.5 in | Wt 156.0 lb

## 2013-04-10 DIAGNOSIS — A498 Other bacterial infections of unspecified site: Secondary | ICD-10-CM

## 2013-04-10 DIAGNOSIS — N183 Chronic kidney disease, stage 3 unspecified: Secondary | ICD-10-CM | POA: Diagnosis not present

## 2013-04-10 DIAGNOSIS — N390 Urinary tract infection, site not specified: Secondary | ICD-10-CM | POA: Insufficient documentation

## 2013-04-10 DIAGNOSIS — E7800 Pure hypercholesterolemia, unspecified: Secondary | ICD-10-CM

## 2013-04-10 DIAGNOSIS — N3020 Other chronic cystitis without hematuria: Secondary | ICD-10-CM | POA: Diagnosis not present

## 2013-04-10 DIAGNOSIS — C61 Malignant neoplasm of prostate: Secondary | ICD-10-CM

## 2013-04-10 DIAGNOSIS — B965 Pseudomonas (aeruginosa) (mallei) (pseudomallei) as the cause of diseases classified elsewhere: Secondary | ICD-10-CM | POA: Diagnosis not present

## 2013-04-10 DIAGNOSIS — F4321 Adjustment disorder with depressed mood: Secondary | ICD-10-CM

## 2013-04-10 LAB — BASIC METABOLIC PANEL WITH GFR
BUN: 27 mg/dL — ABNORMAL HIGH (ref 6–23)
CO2: 24 mEq/L (ref 19–32)
Calcium: 8.8 mg/dL (ref 8.4–10.5)
Chloride: 105 mEq/L (ref 96–112)
Creat: 1.62 mg/dL — ABNORMAL HIGH (ref 0.50–1.35)
GFR, Est African American: 44 mL/min — ABNORMAL LOW
GFR, Est Non African American: 38 mL/min — ABNORMAL LOW
Glucose, Bld: 96 mg/dL (ref 70–99)
Potassium: 4.7 mEq/L (ref 3.5–5.3)
Sodium: 139 mEq/L (ref 135–145)

## 2013-04-10 LAB — LDL CHOLESTEROL, DIRECT: Direct LDL: 90 mg/dL

## 2013-04-10 MED ORDER — FOSFOMYCIN TROMETHAMINE 3 G PO PACK: 3.0000 g | PACK | Freq: Once | ORAL | Status: DC

## 2013-04-10 MED ORDER — EZETIMIBE-SIMVASTATIN 10-20 MG PO TABS: ORAL_TABLET | ORAL | Status: DC

## 2013-04-10 MED ORDER — AMLODIPINE BESYLATE 5 MG PO TABS: ORAL_TABLET | ORAL | Status: DC

## 2013-04-10 NOTE — Telephone Encounter (Signed)
Spoke to pt's daughter told her 30 day supply of each medication was sent to pharmacy. Daughter said she will tell pt.

## 2013-04-10 NOTE — Progress Notes (Signed)
Subjective:    Patient ID: Alexander Mcdonald, male    DOB: [birth year], 80 y.o.   MRN: [id number]  HPI  80 year old man with hx significant for CKD, gout, prostate cancer who has had recurrent symptoms of dysuria, polyuria x 2 years. He has been followed recently by [facility] by Steele Berg and Kathie Rhodes. He previously would have resolution of his symptoms with use of antibiotic and antispasmodic but now continues to have recurrence of ssx shortly after stopping both antibiotic and antispasmodic.   Recently he has repeatedly grown Pseudomonas Aeruginosa from his urine, most recently on 03/30/13 when he grew 75K R to cipro/levo, I to [place] but S to zosyn, IMI, Ceftaz and Cefepime.   He has continued to receive Cefdinir for this despite the fact that this cephalosporin has ZERO ANTI-PSEUDOMONAL activity.   IN talking to me it is not clear that he actually is suffering from genuine UTI's though he does have ssx of dysuria, polyruria and nausea occasionally with vomiting and even excessive sleeping. If these DO correspond to UTI's with Pseudomonas then he must be actually managing to survive these episodes without EFFECTIVE abx over this time period.  He has had no fevers, chills, weight loss or syncope or light headedness.   Review of Systems  Constitutional: Negative for fever, chills, diaphoresis, activity change, appetite change, fatigue and unexpected weight change.  HENT: Negative for congestion, rhinorrhea, sinus pressure, sneezing, sore throat and trouble swallowing.   Eyes: Positive for visual disturbance. Negative for photophobia.  Respiratory: Negative for cough, chest tightness, shortness of breath, wheezing and stridor.   Cardiovascular: Negative for chest pain.  Gastrointestinal: Positive for nausea. Negative for vomiting, abdominal pain, diarrhea, constipation, blood in stool, abdominal distention and anal bleeding.  Genitourinary: Positive for dysuria and frequency.  Negative for hematuria, flank pain and difficulty urinating.  Musculoskeletal: Negative for arthralgias, back pain, gait problem, joint swelling and myalgias.  Skin: Negative for color change, pallor, rash and wound.  Neurological: Negative for dizziness, tremors, weakness and light-headedness.  Hematological: Negative for adenopathy. Does not bruise/bleed easily.  Psychiatric/Behavioral: Negative for behavioral problems, confusion, sleep disturbance, dysphoric mood, decreased concentration and agitation.       Objective:   Physical Exam  Nursing note and vitals reviewed. Constitutional: No distress.  HENT:  Head: Normocephalic and atraumatic.  Mouth/Throat: Oropharynx is clear and moist. No oropharyngeal exudate.  Eyes: Conjunctivae and EOM are normal. No scleral icterus.  Neck: Normal range of motion. Neck supple. No JVD present.  Cardiovascular: Normal rate, regular rhythm and normal heart sounds.  Exam reveals no gallop and no friction rub.   No murmur heard. Pulmonary/Chest: Effort normal and breath sounds normal. No respiratory distress. He has no wheezes. He has no rales. He exhibits no tenderness.  Abdominal: He exhibits no distension and no mass. There is no tenderness. There is no rebound and no guarding.  Musculoskeletal: He exhibits no edema and no tenderness.  Lymphadenopathy:    He has no cervical adenopathy.  Neurological: He is alert. He exhibits normal muscle tone. Coordination normal.  Skin: Skin is warm and dry. He is not diaphoretic. No erythema. No pallor.  Psychiatric: He has a normal mood and affect. His behavior is normal. Judgment and thought content normal.          Assessment & Plan:   Chronic cystitis: not clear at all that this is due to his PSeudomonas. May be more likely an issue of bladder spasm  since he responds to anti-spasmodics. He has had normal cystoscopy and CT scan  I will check UA and culture today and BMP  IF He progresses with MORE  convincing ssx of UTI--which he may, then he can use FOSFOMYCIN 3 GRAMS once and repeated in 3 days . This abx has anti-pseudomonal activity though only in the bladder and not systemically. I have sent rx to his pharmacy so he can have it at the ready (it freq requires prior auth)   If he has even more serious systemic ssx would need IV ceftazidime via PICC line  Could consider IM ceftaz as well but not pleasnt to go IM, would NEVER touch him with AG  I spent greater than 45 minutes with the patient including greater than 50% of time in face to face counsel of the patient and in coordination of their care.    #2 Hyperlipidemia: he and daughter request LDL check for PCP which we will do  #3 Prostate cancer: not on rx, observatino  #4 Grieving. Has lost his wife to pancreatic cancer earlier this fall

## 2013-04-10 NOTE — Telephone Encounter (Signed)
Pt has appt sch for 04-21-13 and needs a refill on vytorin 10-20 mg and amlodipine 5 mg call into [facility]

## 2013-04-11 LAB — URINALYSIS, ROUTINE W REFLEX MICROSCOPIC
Bilirubin Urine: NEGATIVE
Glucose, UA: NEGATIVE mg/dL
Ketones, ur: NEGATIVE mg/dL
Leukocytes, UA: NEGATIVE
Nitrite: NEGATIVE
Protein, ur: NEGATIVE mg/dL
Specific Gravity, Urine: 1.01 (ref 1.005–1.030)
Urobilinogen, UA: 0.2 mg/dL (ref 0.0–1.0)
pH: 6 (ref 5.0–8.0)

## 2013-04-11 LAB — URINALYSIS, MICROSCOPIC ONLY
Bacteria, UA: NONE SEEN
Casts: NONE SEEN
Crystals: NONE SEEN
Squamous Epithelial / LPF: NONE SEEN

## 2013-04-13 LAB — URINE CULTURE: Colony Count: 55000

## 2013-04-14 ENCOUNTER — Ambulatory Visit (INDEPENDENT_AMBULATORY_CARE_PROVIDER_SITE_OTHER): Payer: Medicare Other | Admitting: Infectious Disease

## 2013-04-14 ENCOUNTER — Encounter: Payer: Self-pay | Admitting: Infectious Disease

## 2013-04-14 VITALS — BP 160/68 | HR 73 | Temp 97.9°F | Wt 156.5 lb

## 2013-04-14 DIAGNOSIS — R8271 Bacteriuria: Secondary | ICD-10-CM

## 2013-04-14 DIAGNOSIS — R82998 Other abnormal findings in urine: Secondary | ICD-10-CM | POA: Diagnosis not present

## 2013-04-14 DIAGNOSIS — Z2239 Carrier of other specified bacterial diseases: Secondary | ICD-10-CM | POA: Diagnosis not present

## 2013-04-14 DIAGNOSIS — B965 Pseudomonas (aeruginosa) (mallei) (pseudomallei) as the cause of diseases classified elsewhere: Secondary | ICD-10-CM | POA: Diagnosis not present

## 2013-04-14 DIAGNOSIS — C61 Malignant neoplasm of prostate: Secondary | ICD-10-CM

## 2013-04-14 DIAGNOSIS — N390 Urinary tract infection, site not specified: Secondary | ICD-10-CM

## 2013-04-14 DIAGNOSIS — N3289 Other specified disorders of bladder: Secondary | ICD-10-CM

## 2013-04-14 DIAGNOSIS — N183 Chronic kidney disease, stage 3 unspecified: Secondary | ICD-10-CM | POA: Diagnosis not present

## 2013-04-14 DIAGNOSIS — E7800 Pure hypercholesterolemia, unspecified: Secondary | ICD-10-CM | POA: Diagnosis not present

## 2013-04-14 DIAGNOSIS — F4321 Adjustment disorder with depressed mood: Secondary | ICD-10-CM | POA: Diagnosis not present

## 2013-04-14 DIAGNOSIS — N3020 Other chronic cystitis without hematuria: Secondary | ICD-10-CM | POA: Diagnosis not present

## 2013-04-14 NOTE — Progress Notes (Signed)
Subjective:    Patient ID: Alexander Mcdonald, male    DOB: 08-Sep-1926, 80 y.o.   MRN: [id number]  HPI   80 year old man with hx significant for CKD, gout, prostate cancer who has had recurrent symptoms of dysuria, polyuria x 2 years. He has been followed recently by [facility] by Steele Berg and Kathie Rhodes. He previously would have resolution of his symptoms with use of antibiotic and antispasmodic but now continues to have recurrence of ssx shortly after stopping both antibiotic and antispasmodic.   Recently he has repeatedly grown Pseudomonas Aeruginosa from his urine, most recently on 03/30/13 when he grew 75K R to cipro/levo, I to [place] but S to zosyn, IMI, Ceftaz and Cefepime.   He has continued to receive Cefdinir for this despite the fact that this cephalosporin has ZERO ANTI-PSEUDOMONAL activity.   IN talking to me it was not clear that he actually is suffering from genuine UTI's though he does have ssx of dysuria, polyruria and nausea occasionally with vomiting and even excessive sleeping. If these DO correspond to UTI's with Pseudomonas then he must be actually managing to survive these episodes without EFFECTIVE abx over this time period.  He has had no fevers, chills, weight loss or syncope or light headedness.      I saw him clinic last week and e did UA which was with 0-2 WBC. Urine culture only yielded 55k Pseudomonas Aeruginosa and he has in fact improved clinically since I saw him last week. He has had one epidsode of dysuria this am early but this has subsequently cleared.  Review of Systems  Constitutional: Negative for fever, chills, diaphoresis, activity change, appetite change, fatigue and unexpected weight change.  HENT: Negative for congestion, rhinorrhea, sinus pressure, sneezing, sore throat and trouble swallowing.   Eyes: Positive for visual disturbance. Negative for photophobia.  Respiratory: Negative for cough, chest tightness, shortness of breath, wheezing  and stridor.   Cardiovascular: Negative for chest pain.  Gastrointestinal: Positive for nausea. Negative for vomiting, abdominal pain, diarrhea, constipation, blood in stool, abdominal distention and anal bleeding.  Genitourinary: Positive for dysuria and frequency. Negative for hematuria, flank pain and difficulty urinating.  Musculoskeletal: Negative for arthralgias, back pain, gait problem, joint swelling and myalgias.  Skin: Negative for color change, pallor, rash and wound.  Neurological: Negative for dizziness, tremors, weakness and light-headedness.  Hematological: Negative for adenopathy. Does not bruise/bleed easily.  Psychiatric/Behavioral: Negative for behavioral problems, confusion, sleep disturbance, dysphoric mood, decreased concentration and agitation.       Objective:   Physical Exam  Nursing note and vitals reviewed. Constitutional: No distress.  HENT:  Head: Normocephalic and atraumatic.  Mouth/Throat: Oropharynx is clear and moist. No oropharyngeal exudate.  Eyes: Conjunctivae and EOM are normal. No scleral icterus.  Neck: Normal range of motion. Neck supple. No JVD present.  Cardiovascular: Normal rate, regular rhythm and normal heart sounds.  Exam reveals no gallop and no friction rub.   No murmur heard. Pulmonary/Chest: Effort normal and breath sounds normal. No respiratory distress. He has no wheezes. He has no rales. He exhibits no tenderness.  Abdominal: He exhibits no distension and no mass. There is no tenderness. There is no rebound and no guarding.  Musculoskeletal: He exhibits no edema and no tenderness.  Lymphadenopathy:    He has no cervical adenopathy.  Neurological: He is alert. He exhibits normal muscle tone. Coordination normal.  Skin: Skin is warm and dry. He is not diaphoretic. No erythema. No  pallor.  Psychiatric: He has a normal mood and affect. His behavior is normal. Judgment and thought content normal.          Assessment & Plan:    Chronic cystitis:I think this is more likely an issue of bladder spasm since he responds to anti-spasmodics. He has had normal cystoscopy and CT scan  --continue his antispasmodics continuously --watch for more convincing symptoms + UA c/w UTI, if he develops such ssx he had fosfomycin on hand at home to take if he must take an active oral drug vs pseudomonas (not this abx only active at level of bladder so if he had more progressive infection with THIS pseudomonas would need IV med)  --stay off antibiotics otherwise to give chance for different organism to predominate as colonizer of urine   I spent greater than 40 minutes with the patient including greater than 50% of time in face to face counsel of the patient and his 2nd daughter and in coordination of their care.    #2  Prostate cancer: not on rx, observatino

## 2013-04-16 ENCOUNTER — Other Ambulatory Visit: Payer: Self-pay | Admitting: Internal Medicine

## 2013-04-21 ENCOUNTER — Encounter: Payer: Self-pay | Admitting: Internal Medicine

## 2013-04-21 ENCOUNTER — Ambulatory Visit (INDEPENDENT_AMBULATORY_CARE_PROVIDER_SITE_OTHER): Payer: Medicare Other | Admitting: Internal Medicine

## 2013-04-21 VITALS — BP 126/60 | HR 66 | Temp 98.0°F | Resp 18 | Ht 64.5 in | Wt 156.0 lb

## 2013-04-21 DIAGNOSIS — N390 Urinary tract infection, site not specified: Secondary | ICD-10-CM | POA: Diagnosis not present

## 2013-04-21 DIAGNOSIS — I10 Essential (primary) hypertension: Secondary | ICD-10-CM | POA: Diagnosis not present

## 2013-04-21 DIAGNOSIS — N189 Chronic kidney disease, unspecified: Secondary | ICD-10-CM

## 2013-04-21 DIAGNOSIS — E7800 Pure hypercholesterolemia, unspecified: Secondary | ICD-10-CM

## 2013-04-21 DIAGNOSIS — R350 Frequency of micturition: Secondary | ICD-10-CM

## 2013-04-21 MED ORDER — MIRABEGRON ER 25 MG PO TB24: 25.0000 mg | ORAL_TABLET | Freq: Every day | ORAL | Status: DC

## 2013-04-21 NOTE — Progress Notes (Signed)
Subjective:    Patient ID: Alexander Mcdonald, male    DOB: [birth year], 80 y.o.   MRN: [id number]  HPI  80 year old patient who has a history of carotid artery disease, hypertension, and dyslipidemia.  He has not been seen here in about 18 months, but has been followed closely by urology due to urinary frequency and occasional dysuria.  He has been referred to ID due to recurrent UTIs.  More recently, he has had a pseudomonal species thought colonization.  He has had less than 100,000 colonies and very little pyuria.  He denies any fever or change in his symptoms, but is quite bothered by polyuria, including frequent nocturia.  Past Medical History  Diagnosis Date  . ACUTE RENAL FAILURE W/LESION OF TUBULAR NECROSIS 01/04/2007  . CAROTID ARTERY DISEASE 06/01/2009  . DEPRESSION 07/25/2006  . DIVERTICULOSIS OF COLON 02/01/2007  . GOUT 07/25/2006  . HEMORRHOID, THROMBOSED 02/15/2009  . HYPERCHOLESTEROLEMIA 07/25/2006  . HYPERTENSION 07/25/2006  . PROSTATE CANCER, HX OF 07/25/2006  . Rosacea 09/28/2008  . WEIGHT LOSS 09/01/2009  . Glaucoma     History   Social History  . Marital Status: Married    Spouse Name: N/A    Number of Children: N/A  . Years of Education: N/A   Occupational History  . Not on file.   Social History Main Topics  . Smoking status: Former Smoker    Quit date: 01/17/1988  . Smokeless tobacco: Never Used  . Alcohol Use: 3.5 oz/week    7 drink(s) per week     Comment: beer  . Drug Use: No  . Sexual Activity: No   Other Topics Concern  . Not on file   Social History Narrative  . No narrative on file    Past Surgical History  Procedure Laterality Date  . Carotid endarterectomy    . Cataract extraction      History reviewed. No pertinent family history.  Allergies  Allergen Reactions  . Shellfish Allergy Anaphylaxis    Pt is allergic to mussels     Current Outpatient Prescriptions on File Prior to Visit  Medication Sig Dispense Refill  . amLODipine (NORVASC)  5 MG tablet TAKE 1 TABLET BY MOUTH EVERY DAY  90 tablet  0  . esomeprazole (NEXIUM) 40 MG capsule Take 40 mg by mouth daily before breakfast.      . ezetimibe-simvastatin (VYTORIN) 10-20 MG per tablet TAKE 1 TABLET BY MOUTH AT BEDTIME  30 tablet  0  . fish oil-omega-3 fatty acids 1000 MG capsule Take 1 g by mouth daily.      . fosinopril (MONOPRIL) 20 MG tablet TAKE 1 TABLET BY MOUTH DAILY  90 tablet  0  . LUMIGAN 0.01 % SOLN       . Meth-Hyo-M Bl-Na Phos-Ph Sal (URIBEL) 118 MG CAPS Take 1 capsule by mouth 3 (three) times daily.       . tamsulosin (FLOMAX) 0.4 MG CAPS capsule TAKE ONE CAPSULE BY MOUTH EVERY DAY  90 capsule  0   No current facility-administered medications on file prior to visit.    BP 126/60  Pulse 66  Temp(Src) 98 F (36.7 C) (Oral)  Resp 18  Ht 5' 4.5" (1.638 m)  Wt 156 lb (70.761 kg)  BMI 26.37 kg/m2  SpO2 98%       Review of Systems  Constitutional: Negative for fever, chills, appetite change and fatigue.  HENT: Negative for congestion, dental problem, ear pain, hearing loss, sore throat, tinnitus, trouble  swallowing and voice change.   Eyes: Negative for pain, discharge and visual disturbance.  Respiratory: Negative for cough, chest tightness, wheezing and stridor.   Cardiovascular: Negative for chest pain, palpitations and leg swelling.  Gastrointestinal: Negative for nausea, vomiting, abdominal pain, diarrhea, constipation, blood in stool and abdominal distention.  Genitourinary: Positive for dysuria and frequency. Negative for urgency, hematuria, flank pain, discharge, difficulty urinating and genital sores.  Musculoskeletal: Negative for arthralgias, back pain, gait problem, joint swelling, myalgias and neck stiffness.  Skin: Negative for rash.  Neurological: Negative for dizziness, syncope, speech difficulty, weakness, numbness and headaches.  Hematological: Negative for adenopathy. Does not bruise/bleed easily.  Psychiatric/Behavioral: Negative for  behavioral problems and dysphoric mood. The patient is not nervous/anxious.        Objective:   Physical Exam  Constitutional: He is oriented to person, place, and time. He appears well-developed.  HENT:  Head: Normocephalic.  Right Ear: External ear normal.  Left Ear: External ear normal.  Eyes: Conjunctivae and EOM are normal.  Neck: Normal range of motion.  Cardiovascular: Normal rate and normal heart sounds.   Pulmonary/Chest: Breath sounds normal.  Abdominal: Bowel sounds are normal.  Musculoskeletal: Normal range of motion. He exhibits no edema and no tenderness.  Neurological: He is alert and oriented to person, place, and time.  Psychiatric: He has a normal mood and affect. His behavior is normal.          Assessment & Plan:   Urinary frequency.  Will give a trial of Myrbetriq and observe.  We'll check a urinalysis if he develops any change in symptoms, fever, or dysuria Hypertension Carotid artery disease Dyslipidemia  Schedule CPX

## 2013-04-21 NOTE — Patient Instructions (Signed)
Followup urology  Return in 3 months for follow-up

## 2013-04-21 NOTE — Progress Notes (Signed)
Pre-visit discussion using our clinic review tool. No additional management support is needed unless otherwise documented below in the visit note.  

## 2013-04-22 ENCOUNTER — Telehealth: Payer: Self-pay | Admitting: Internal Medicine

## 2013-04-22 DIAGNOSIS — N390 Urinary tract infection, site not specified: Secondary | ICD-10-CM | POA: Diagnosis not present

## 2013-04-22 DIAGNOSIS — R350 Frequency of micturition: Secondary | ICD-10-CM | POA: Diagnosis not present

## 2013-04-22 LAB — POCT URINALYSIS DIPSTICK
Bilirubin, UA: NEGATIVE
Blood, UA: NEGATIVE
Glucose, UA: NEGATIVE
Ketones, UA: NEGATIVE
Nitrite, UA: NEGATIVE
Protein, UA: NEGATIVE
Spec Grav, UA: 1.01
Urobilinogen, UA: 0.2
pH, UA: 7

## 2013-04-22 NOTE — Telephone Encounter (Signed)
Spoke to [place], told her orders have been put in for UA and Culture, just needs to come by lab to give specimen. Barnett Applebaum verbalized understanding and stated her sister is bringing him. Told her okay.

## 2013-04-22 NOTE — Addendum Note (Signed)
Addended by: Joyce Gross R on: 04/22/2013 02:30 PM   Modules accepted: Orders

## 2013-04-22 NOTE — Telephone Encounter (Signed)
Patient Information:  Caller Name: Barnett Applebaum  Phone: [phone]  Patient: Alexander Mcdonald, Alexander Mcdonald  Gender: Male  DOB: 03/08/1926  Age: 80 Years  PCP: Bluford Kaufmann (Family Practice > 27yrs old)  Office Follow Up:  Does the office need to follow up with this patient?: No  Instructions For The Office: N/A  RN Note:  Daughter calling regarding Father/Josimar who was seen in office on 04/21/13.  Patient was told to drop off UA culture at their convenience.  Daughter wants to make sure UA culture will be sent for culture.  Reviewed plan of care in EPIC and advised daughter Urinalysis will be ordered PRN.  Symptoms  Reason For Call & Symptoms: questions about urine specimen  Reviewed Health History In EMR: Yes  Reviewed Medications In EMR: Yes  Reviewed Allergies In EMR: Yes  Reviewed Surgeries / Procedures: Yes  Date of Onset of Symptoms: 04/22/2013  Guideline(s) Used:  No Protocol Available - Information Only  Disposition Per Guideline:   Home Care  Reason For Disposition Reached:   Information only question and nurse able to answer  Advice Given:  Call Back If:  New symptoms develop  Patient Will Follow Care Advice:  YES

## 2013-04-23 LAB — URINE CULTURE
Colony Count: NO GROWTH
Organism ID, Bacteria: NO GROWTH

## 2013-05-12 ENCOUNTER — Other Ambulatory Visit: Payer: Self-pay | Admitting: Internal Medicine

## 2013-05-19 DIAGNOSIS — H35039 Hypertensive retinopathy, unspecified eye: Secondary | ICD-10-CM | POA: Diagnosis not present

## 2013-05-19 DIAGNOSIS — H348392 Tributary (branch) retinal vein occlusion, unspecified eye, stable: Secondary | ICD-10-CM | POA: Diagnosis not present

## 2013-05-19 DIAGNOSIS — H4011X0 Primary open-angle glaucoma, stage unspecified: Secondary | ICD-10-CM | POA: Diagnosis not present

## 2013-05-19 DIAGNOSIS — H409 Unspecified glaucoma: Secondary | ICD-10-CM | POA: Diagnosis not present

## 2013-05-19 DIAGNOSIS — H401123 Primary open-angle glaucoma, left eye, severe stage: Secondary | ICD-10-CM | POA: Insufficient documentation

## 2013-06-04 ENCOUNTER — Ambulatory Visit (INDEPENDENT_AMBULATORY_CARE_PROVIDER_SITE_OTHER): Payer: Medicare Other | Admitting: Infectious Disease

## 2013-06-04 ENCOUNTER — Encounter: Payer: Self-pay | Admitting: Infectious Disease

## 2013-06-04 VITALS — BP 148/69 | HR 65 | Temp 98.2°F | Wt 161.0 lb

## 2013-06-04 DIAGNOSIS — R82998 Other abnormal findings in urine: Secondary | ICD-10-CM | POA: Diagnosis not present

## 2013-06-04 DIAGNOSIS — R0602 Shortness of breath: Secondary | ICD-10-CM | POA: Diagnosis not present

## 2013-06-04 DIAGNOSIS — Z2239 Carrier of other specified bacterial diseases: Secondary | ICD-10-CM

## 2013-06-04 DIAGNOSIS — R8271 Bacteriuria: Secondary | ICD-10-CM

## 2013-06-04 DIAGNOSIS — N3289 Other specified disorders of bladder: Secondary | ICD-10-CM

## 2013-06-04 LAB — CBC WITH DIFFERENTIAL/PLATELET
Basophils Absolute: 0 10*3/uL (ref 0.0–0.1)
Basophils Relative: 0 % (ref 0–1)
Eosinophils Absolute: 0.2 10*3/uL (ref 0.0–0.7)
Eosinophils Relative: 2 % (ref 0–5)
HCT: 37.2 % — ABNORMAL LOW (ref 39.0–52.0)
Hemoglobin: 12.9 g/dL — ABNORMAL LOW (ref 13.0–17.0)
Lymphocytes Relative: 16 % (ref 12–46)
Lymphs Abs: 1.5 10*3/uL (ref 0.7–4.0)
MCH: 30.2 pg (ref 26.0–34.0)
MCHC: 34.7 g/dL (ref 30.0–36.0)
MCV: 87.1 fL (ref 78.0–100.0)
Monocytes Absolute: 0.9 10*3/uL (ref 0.1–1.0)
Monocytes Relative: 10 % (ref 3–12)
Neutro Abs: 6.8 10*3/uL (ref 1.7–7.7)
Neutrophils Relative %: 72 % (ref 43–77)
Platelets: 169 10*3/uL (ref 150–400)
RBC: 4.27 MIL/uL (ref 4.22–5.81)
RDW: 14.7 % (ref 11.5–15.5)
WBC: 9.4 10*3/uL (ref 4.0–10.5)

## 2013-06-04 NOTE — Progress Notes (Signed)
Subjective:    Patient ID: Alexander Mcdonald, male    DOB: July 20, 1926, 80 y.o.   MRN: [id number]  HPI   80 year old man with hx significant for CKD, gout, prostate cancer who has had recurrent symptoms of dysuria, polyuria x 2 years. He has been followed recently by [facility] by Alexander Mcdonald and Alexander Mcdonald. He previously would have resolution of his symptoms with use of antibiotic and antispasmodic but now continues to have recurrence of ssx shortly after stopping both antibiotic and antispasmodic.   Recently he has repeatedly grown Pseudomonas Aeruginosa from his urine, most recently on 03/30/13 when he grew 75K R to cipro/levo, I to [place] but S to zosyn, IMI, Ceftaz and Cefepime.   He has continued to receive Cefdinir for this despite the fact that this cephalosporin has ZERO ANTI-PSEUDOMONAL activity.   IN talking to me it was not clear that he actually is suffering from genuine UTI's though he does have ssx of dysuria, polyruria and nausea occasionally with vomiting and even excessive sleeping. If these DO correspond to UTI's with Pseudomonas then he must be actually managing to survive these episodes without EFFECTIVE abx over this time period.  He has had no fevers, chills, weight loss or syncope or light headedness.      I saw him clinic for first time in March and did UA which was with 0-2 WBC. Urine culture only yielded 55k Pseudomonas Aeruginosa and he has in fact improved clinically since I saw him last week. He has had one epidsode of dysuria this am early but this has subsequently cleared.  I saw him a week later and he was doing fine and I encouraged use of antispasmodics and avoidance of antibiotics. SInce then he has been seen by PCP who has given him rx for [facility] and he has had DRAMATIC improvement in urinary spasm ssx. He had repeat UA and urine culture that was completely sterile. He has not resumed any abx. He has no systemic ssx. He has felt SOB with DOE in  particular recently. NO cough, no fever. No recent travel. Not had blood work for 2 years which I agreed to check.   Review of Systems  Constitutional: Positive for fatigue. Negative for fever, chills, diaphoresis, activity change, appetite change and unexpected weight change.  HENT: Negative for congestion.   Eyes: Negative for photophobia.  Respiratory: Positive for shortness of breath. Negative for cough, chest tightness, wheezing and stridor.   Cardiovascular: Negative for chest pain.  Gastrointestinal: Positive for nausea. Negative for vomiting, abdominal pain, diarrhea, constipation and abdominal distention.  Genitourinary: Negative for dysuria, frequency, hematuria, flank pain and difficulty urinating.  Musculoskeletal: Negative for arthralgias and back pain.  Skin: Negative for color change and pallor.  Neurological: Negative for tremors.  Psychiatric/Behavioral: Negative for sleep disturbance and dysphoric mood.       Objective:   Physical Exam  Nursing note and vitals reviewed. Constitutional: No distress.  HENT:  Head: Normocephalic and atraumatic.  Mouth/Throat: Oropharynx is clear and moist. No oropharyngeal exudate.  Eyes: Conjunctivae and EOM are normal. No scleral icterus.  Neck: Normal range of motion. Neck supple. No JVD present.  Cardiovascular: Normal rate, regular rhythm and normal heart sounds.  Exam reveals no gallop and no friction rub.   No murmur heard. Pulmonary/Chest: Effort normal and breath sounds normal. No respiratory distress. He has no wheezes. He has no rales. He exhibits no tenderness.  Abdominal: He exhibits no distension and no  mass. There is no tenderness. There is no rebound and no guarding.  Musculoskeletal: He exhibits no edema and no tenderness.  Lymphadenopathy:    He has no cervical adenopathy.  Neurological: He is alert. He exhibits normal muscle tone. Coordination normal.  Skin: Skin is warm and dry. He is not diaphoretic. No erythema.  No pallor.  Psychiatric: He has a normal mood and affect. His behavior is normal. Judgment and thought content normal.          Assessment & Plan:   #1 Chronic cystitis:  --continue his antispasmodics --continue to AVOID unnecessary antibiotics and avoid over-treating him with abx, the longer without abx the BETTER  rtc in 3 months  #2 DOE: will check labs in case he is now anemic or has other significant abnormalities, defer to PCP and asked daughter to take pt to PCP for further workup and not need to wait for complete physical spot  I spent greater than 25 minutes with the patient including greater than 50% of time in face to face counsel of the patient and his 2nd daughter and in coordination of their care.    #3 Prostate cancer: not on rx, observatino

## 2013-06-05 ENCOUNTER — Telehealth: Payer: Self-pay | Admitting: Internal Medicine

## 2013-06-05 LAB — COMPLETE METABOLIC PANEL WITH GFR
ALT: 14 U/L (ref 0–53)
AST: 15 U/L (ref 0–37)
Albumin: 4.1 g/dL (ref 3.5–5.2)
Alkaline Phosphatase: 61 U/L (ref 39–117)
BUN: 31 mg/dL — ABNORMAL HIGH (ref 6–23)
CO2: 22 mEq/L (ref 19–32)
Calcium: 8.9 mg/dL (ref 8.4–10.5)
Chloride: 96 mEq/L (ref 96–112)
Creat: 1.95 mg/dL — ABNORMAL HIGH (ref 0.50–1.35)
GFR, Est African American: 35 mL/min — ABNORMAL LOW
GFR, Est Non African American: 31 mL/min — ABNORMAL LOW
Glucose, Bld: 87 mg/dL (ref 70–99)
Potassium: 5 mEq/L (ref 3.5–5.3)
Sodium: 144 mEq/L (ref 135–145)
Total Bilirubin: 0.8 mg/dL (ref 0.2–1.2)
Total Protein: 6.8 g/dL (ref 6.0–8.3)

## 2013-06-05 NOTE — Telephone Encounter (Signed)
Patient Information:  Caller Name: Barnett Applebaum  Phone: [phone]  Patient: Alexander Mcdonald, Alexander Mcdonald  Gender: Male  DOB: February 27, 1926  Age: 80 Years  PCP: Bluford Kaufmann (Family Practice > 39yrs old)  Office Follow Up:  Does the office need to follow up with this patient?: Yes  Instructions For The Office: Appointments full. Attempted to schedule at another office and caller states unable to get to any of them before 17:00.  Please follow up with caller regarding possible work in appointment or send to Urgent Care. Thank you.   Symptoms  Reason For Call & Symptoms: Caller reports patient is short of breath "for a few weeks now", lightheaded.  She also reports near fainting episode when he was standing in a store. Per Breathing Difficulty guideline it is mild due to speaks normally in sentences,  Emergent symptoms ruled out.   Go to Office Now per Breathing Difficulty guideline due to Mild difficulty breathing of new onset or worse than normal.  Reviewed Health History In EMR: Yes  Reviewed Medications In EMR: Yes  Reviewed Allergies In EMR: Yes  Reviewed Surgeries / Procedures: Yes  Date of Onset of Symptoms: Unknown  Guideline(s) Used:  Breathing Difficulty  Disposition Per Guideline:   Go to Office Now  Reason For Disposition Reached:   Mild difficulty breathing (e.g., minimal/no SOB at rest, SOB with walking, pulse < 100) of new onset or worse than normal  Advice Given:  N/A  RN Overrode Recommendation:  Document Patient  Appointments full. Attempted to schedule at another office and caller states unable to get to any of them before 17:00.  Please follow up with caller regarding possible work in appointment or send to Urgent Care. Thank you.

## 2013-06-05 NOTE — Telephone Encounter (Signed)
Shortness of breath of new onset should be evaluated today, but will happily see tomorrow  if patient prefers

## 2013-06-05 NOTE — Telephone Encounter (Signed)
Pt scheduled to come in tomorrow at 9:15, pt refused to go to Urgent Care.  Pt is unable to make it to office before the office closes.  Is it ok for pt to wait until appt tomorrow to come in?

## 2013-06-06 ENCOUNTER — Ambulatory Visit (INDEPENDENT_AMBULATORY_CARE_PROVIDER_SITE_OTHER): Payer: Medicare Other | Admitting: Internal Medicine

## 2013-06-06 ENCOUNTER — Encounter: Payer: Self-pay | Admitting: Internal Medicine

## 2013-06-06 VITALS — BP 150/80 | HR 75 | Temp 97.8°F | Wt 161.0 lb

## 2013-06-06 DIAGNOSIS — Z8546 Personal history of malignant neoplasm of prostate: Secondary | ICD-10-CM

## 2013-06-06 DIAGNOSIS — N189 Chronic kidney disease, unspecified: Secondary | ICD-10-CM

## 2013-06-06 DIAGNOSIS — E7800 Pure hypercholesterolemia, unspecified: Secondary | ICD-10-CM

## 2013-06-06 DIAGNOSIS — R0602 Shortness of breath: Secondary | ICD-10-CM | POA: Diagnosis not present

## 2013-06-06 DIAGNOSIS — I10 Essential (primary) hypertension: Secondary | ICD-10-CM | POA: Diagnosis not present

## 2013-06-06 NOTE — Progress Notes (Signed)
Subjective:    Patient ID: Alexander Mcdonald, male    DOB: [birth year], 80 y.o.   MRN: [id number]  HPI   80 year old patient who called the office late yesterday afternoon with a chief complaint of shortness of breath.  He was seen by ID and the day before with a chief complaint of shortness of breath.  This really sounds more like mild DOA , which has been fairly chronic.  His chief complaint today is dizziness.  He states this has been a bit of a problem for about 2 months and occurs 3 times per week.  This happened in the presence of his daughter recently and she was concerned about a possible fall risk.  He does have a history of hypertension, but his symptoms do not sound orthostatic.  They usually occur with him standing.  Recent laboratory studies were reviewed.  Symptoms do not sound vertiginous. Patient has had 2-D echocardiogram as well as carotid artery ultrasound performed within the past 2 years.  This revealed the mild AI, but no aortic stenosis.  No significant carotid artery disease Past Medical History  Diagnosis Date  . ACUTE RENAL FAILURE W/LESION OF TUBULAR NECROSIS 01/04/2007  . CAROTID ARTERY DISEASE 06/01/2009  . DEPRESSION 07/25/2006  . DIVERTICULOSIS OF COLON 02/01/2007  . GOUT 07/25/2006  . HEMORRHOID, THROMBOSED 02/15/2009  . HYPERCHOLESTEROLEMIA 07/25/2006  . HYPERTENSION 07/25/2006  . PROSTATE CANCER, HX OF 07/25/2006  . Rosacea 09/28/2008  . WEIGHT LOSS 09/01/2009  . Glaucoma     History   Social History  . Marital Status: Married    Spouse Name: N/A    Number of Children: N/A  . Years of Education: N/A   Occupational History  . Not on file.   Social History Main Topics  . Smoking status: Former Smoker    Quit date: 01/17/1988  . Smokeless tobacco: Never Used  . Alcohol Use: 3.5 oz/week    7 drink(s) per week     Comment: beer  . Drug Use: No  . Sexual Activity: No   Other Topics Concern  . Not on file   Social History Narrative  . No narrative on file     Past Surgical History  Procedure Laterality Date  . Carotid endarterectomy    . Cataract extraction      History reviewed. No pertinent family history.  Allergies  Allergen Reactions  . Shellfish Allergy Anaphylaxis    Pt is allergic to mussels     Current Outpatient Prescriptions on File Prior to Visit  Medication Sig Dispense Refill  . amLODipine (NORVASC) 5 MG tablet TAKE 1 TABLET BY MOUTH EVERY DAY  90 tablet  0  . esomeprazole (NEXIUM) 40 MG capsule Take 40 mg by mouth daily before breakfast.      . fish oil-omega-3 fatty acids 1000 MG capsule Take 1 g by mouth daily.      . fosinopril (MONOPRIL) 20 MG tablet TAKE 1 TABLET BY MOUTH DAILY  90 tablet  0  . LUMIGAN 0.01 % SOLN       . Meth-Hyo-M Bl-Na Phos-Ph Sal (URIBEL) 118 MG CAPS Take 1 capsule by mouth 3 (three) times daily.       . mirabegron ER (MYRBETRIQ) 25 MG TB24 tablet Take 1 tablet (25 mg total) by mouth daily.  30 tablet    . tamsulosin (FLOMAX) 0.4 MG CAPS capsule TAKE ONE CAPSULE BY MOUTH EVERY DAY  90 capsule  0  . VYTORIN 10-20 MG per tablet  TAKE 1 TABLET BY MOUTH AT BEDTIME  30 tablet  0   No current facility-administered medications on file prior to visit.    BP 150/80  Pulse 75  Temp(Src) 97.8 F (36.6 C) (Oral)  Wt 161 lb (73.029 kg)  SpO2 97%       Review of Systems  Constitutional: Negative for fever, chills, appetite change and fatigue.  HENT: Negative for congestion, dental problem, ear pain, hearing loss, sore throat, tinnitus, trouble swallowing and voice change.   Eyes: Negative for pain, discharge and visual disturbance.  Respiratory: Positive for shortness of breath. Negative for cough, chest tightness, wheezing and stridor.   Cardiovascular: Negative for chest pain, palpitations and leg swelling.  Gastrointestinal: Negative for nausea, vomiting, abdominal pain, diarrhea, constipation, blood in stool and abdominal distention.  Genitourinary: Negative for urgency, hematuria, flank  pain, discharge, difficulty urinating and genital sores.  Musculoskeletal: Negative for arthralgias, back pain, gait problem, joint swelling, myalgias and neck stiffness.  Skin: Negative for rash.  Neurological: Positive for light-headedness. Negative for dizziness, syncope, speech difficulty, weakness, numbness and headaches.  Hematological: Negative for adenopathy. Does not bruise/bleed easily.  Psychiatric/Behavioral: Negative for behavioral problems and dysphoric mood. The patient is not nervous/anxious.        Objective:   Physical Exam  Constitutional: He is oriented to person, place, and time. He appears well-developed.  Blood pressure 130/80 without orthostatic changes  HENT:  Head: Normocephalic.  Right Ear: External ear normal.  Left Ear: External ear normal.  Eyes: Conjunctivae and EOM are normal.  Neck: Normal range of motion.  Cardiovascular: Normal rate.   Murmur heard. Grade 3 over 6 murmur heard diffusely with radiation to the carotids  Pulmonary/Chest: Breath sounds normal.  Abdominal: Bowel sounds are normal.  Musculoskeletal: Normal range of motion. He exhibits no edema and no tenderness.  Neurological: He is alert and oriented to person, place, and time.  Psychiatric: He has a normal mood and affect. His behavior is normal.          Assessment & Plan:

## 2013-06-06 NOTE — Progress Notes (Signed)
Pre visit review using our clinic review tool, if applicable. No additional management support is needed unless otherwise documented below in the visit note. 

## 2013-06-06 NOTE — Patient Instructions (Signed)
Limit your sodium (Salt) intake    It is important that you exercise regularly, at least 20 minutes 3 to 4 times per week.  If you develop chest pain or shortness of breath seek  medical attention.  Return in 3 months for follow-up  

## 2013-06-10 ENCOUNTER — Telehealth: Payer: Self-pay | Admitting: Internal Medicine

## 2013-06-10 DIAGNOSIS — H348192 Central retinal vein occlusion, unspecified eye, stable: Secondary | ICD-10-CM | POA: Diagnosis not present

## 2013-06-10 DIAGNOSIS — H35359 Cystoid macular degeneration, unspecified eye: Secondary | ICD-10-CM | POA: Diagnosis not present

## 2013-06-10 DIAGNOSIS — H35059 Retinal neovascularization, unspecified, unspecified eye: Secondary | ICD-10-CM | POA: Diagnosis not present

## 2013-06-10 DIAGNOSIS — H348392 Tributary (branch) retinal vein occlusion, unspecified eye, stable: Secondary | ICD-10-CM | POA: Diagnosis not present

## 2013-06-10 DIAGNOSIS — H4011X0 Primary open-angle glaucoma, stage unspecified: Secondary | ICD-10-CM | POA: Diagnosis not present

## 2013-06-10 NOTE — Telephone Encounter (Signed)
Relevant patient education assigned to patient using Emmi. ° °

## 2013-06-13 ENCOUNTER — Other Ambulatory Visit: Payer: Self-pay | Admitting: Internal Medicine

## 2013-06-13 ENCOUNTER — Telehealth: Payer: Self-pay | Admitting: Internal Medicine

## 2013-06-13 NOTE — Telephone Encounter (Signed)
When pt was in the office the other day, daughter states dr k mentioned pt had his heart checked out in the past year. Daughter would like to know what he was referring to (ekg?) and could she get that report. Daughter states Pt has not had cpe since 2013. pls call her w/ info

## 2013-06-13 NOTE — Telephone Encounter (Signed)
Spoke to [place], she had questions about visit and why EKG and other heart tests were not ordered due to SOB. Told her according to note he was mainly c/o dizziness and lungs were clear on exam and tests done in the past showed no significant coronary disease. [name] verbalized understanding. Told her if she likes can schedule physical and then EKG will be done with exam and can go from there. [name] verbalized understanding and will call back to schedule.

## 2013-06-20 ENCOUNTER — Encounter: Payer: Self-pay | Admitting: Internal Medicine

## 2013-06-20 ENCOUNTER — Ambulatory Visit (INDEPENDENT_AMBULATORY_CARE_PROVIDER_SITE_OTHER): Payer: Medicare Other | Admitting: Internal Medicine

## 2013-06-20 VITALS — BP 120/60 | HR 62 | Temp 97.7°F | Resp 20 | Ht 64.5 in | Wt 164.0 lb

## 2013-06-20 DIAGNOSIS — N189 Chronic kidney disease, unspecified: Secondary | ICD-10-CM

## 2013-06-20 DIAGNOSIS — Z0000 Encounter for general adult medical examination without abnormal findings: Secondary | ICD-10-CM | POA: Diagnosis not present

## 2013-06-20 DIAGNOSIS — M109 Gout, unspecified: Secondary | ICD-10-CM

## 2013-06-20 DIAGNOSIS — Z8546 Personal history of malignant neoplasm of prostate: Secondary | ICD-10-CM

## 2013-06-20 DIAGNOSIS — I10 Essential (primary) hypertension: Secondary | ICD-10-CM | POA: Diagnosis not present

## 2013-06-20 DIAGNOSIS — E7800 Pure hypercholesterolemia, unspecified: Secondary | ICD-10-CM

## 2013-06-20 DIAGNOSIS — I6529 Occlusion and stenosis of unspecified carotid artery: Secondary | ICD-10-CM

## 2013-06-20 MED ORDER — FOSINOPRIL SODIUM 20 MG PO TABS: ORAL_TABLET | ORAL | Status: DC

## 2013-06-20 MED ORDER — TAMSULOSIN HCL 0.4 MG PO CAPS: ORAL_CAPSULE | ORAL | Status: DC

## 2013-06-20 MED ORDER — MIRABEGRON ER 25 MG PO TB24: 25.0000 mg | ORAL_TABLET | Freq: Every day | ORAL | Status: DC

## 2013-06-20 MED ORDER — AMLODIPINE BESYLATE 5 MG PO TABS: ORAL_TABLET | ORAL | Status: DC

## 2013-06-20 NOTE — Patient Instructions (Signed)
Limit your sodium (Salt) intake    It is important that you exercise regularly, at least 20 minutes 3 to 4 times per week.  If you develop chest pain or shortness of breath seek  medical attention.  Return in 6 months for follow-up  

## 2013-06-20 NOTE — Progress Notes (Signed)
Subjective:    Patient ID: Alexander Mcdonald, male    DOB: 11/04/1926, 80 y.o.   MRN: [id number]  HPI  Pre visit review using our clinic review tool, if applicable. No additional management support is needed unless otherwise documented below in the visit note.   80 year old patient who is seen today for a wellness exam.  He is followed closely by urology and has also been evaluated by ID.  Recently, due to chronic UTIs.  At the present time.  He has done quite well.  He denies any recurrent shortness of breath.  He has no complaints and when asked specifically about dizziness.  He states that he really becomes a bit lightheaded only.  In general, doing quite well.  Here for Medicare AWV:   1. Risk factors based on Past M, S, F history: cardiovascular risk factors include hypertension, and hypercholesterolemia  2. Physical Activities: remains very active physically, although limited by partial blindness  3. Depression/mood: history of depression, which has been stable. No current treatment  4. Hearing: no deficits  5. ADL's: independent in all aspects of daily living  6. Fall Risk: moderate due to visual deficits  7. Home Safety: no problems identified  8. Height, weight, &visual acuity:height and weight stable. Light perception only involving the right eye  9. Counseling: heart healthy diet regular. Exercise encouraged  10. Labs ordered based on risk factors: laboratory profile, including lipid panel be reviewed  11. Referral Coordination- will follow-up with ophthalmology and urology  12. Care Plan- heart healthy diet regular exercise. All encouraged modest weight loss encouraged  13. Cognitive Assessment- alert and oriented normal affect. No history of memory dysfunction, handles all executive functioning   Allergies (verified):  No Known Drug Allergies   Past History:  Past Medical History:   Prostate cancer, hx of  Depression  Gout  Hypertension  glaucoma   renal insufficiency   history of acute renal failure  Carotid artery disease  Past Surgical History:   Carotid endarterectomy  diagnosed with prostate cancer in 2002. He was Gleason 6 involve the right middle lobe  Cataract extraction right eye 2009  colonoscopy June 2010  carotid artery Doppler ultrasound, April 2010, 5-11 5-12, 6-13  Family History:   Fam hx Leukemia  Family History Hypertension  both parents died at 3 brother may have had coronary artery disease. Sister died of leukemia  two brothers, 3 sisters   Social History:  Reviewed history from 07/25/2006 and no changes required.  Widower; son in [place].  Daughter lives nearby Former Smoker  Alcohol use-yes     Review of Systems  Constitutional: Negative for fever, chills, activity change, appetite change and fatigue.  HENT: Negative for congestion, dental problem, ear pain, hearing loss, mouth sores, rhinorrhea, sinus pressure, sneezing, tinnitus, trouble swallowing and voice change.   Eyes: Negative for photophobia, pain, redness and visual disturbance.  Respiratory: Negative for apnea, cough, choking, chest tightness, shortness of breath and wheezing.   Cardiovascular: Negative for chest pain, palpitations and leg swelling.  Gastrointestinal: Negative for nausea, vomiting, abdominal pain, diarrhea, constipation, blood in stool, abdominal distention, anal bleeding and rectal pain.  Genitourinary: Negative for dysuria, urgency, frequency, hematuria, flank pain, decreased urine volume, discharge, penile swelling, scrotal swelling, difficulty urinating, genital sores and testicular pain.  Musculoskeletal: Negative for arthralgias, back pain, gait problem, joint swelling, myalgias, neck pain and neck stiffness.  Skin: Negative for color change, rash and wound.  Neurological: Positive for light-headedness. Negative for dizziness,  tremors, seizures, syncope, facial asymmetry, speech difficulty, weakness, numbness and headaches.    Hematological: Negative for adenopathy. Does not bruise/bleed easily.  Psychiatric/Behavioral: Negative for suicidal ideas, hallucinations, behavioral problems, confusion, sleep disturbance, self-injury, dysphoric mood, decreased concentration and agitation. The patient is not nervous/anxious.        Objective:   Physical Exam  Constitutional: He appears well-developed and well-nourished.  HENT:  Head: Normocephalic and atraumatic.  Right Ear: External ear normal.  Left Ear: External ear normal.  Nose: Nose normal.  Mouth/Throat: Oropharynx is clear and moist.  Dentures in place  Eyes: Conjunctivae and EOM are normal. Pupils are equal, round, and reactive to light. No scleral icterus.  Neck: Normal range of motion. Neck supple. No JVD present. No thyromegaly present.  Left carotid endarterectomy scar  Cardiovascular: Regular rhythm and intact distal pulses.  Exam reveals no gallop and no friction rub.   Murmur heard. Grade 3 over 6 systolic murmur heard diffusely Bilateral femoral bruits  Pulmonary/Chest: Effort normal and breath sounds normal. He exhibits no tenderness.  Abdominal: Soft. Bowel sounds are normal. He exhibits no distension and no mass. There is no tenderness.  Ventral hernia  Genitourinary: Penis normal.  Urology exams biannual  Musculoskeletal: Normal range of motion. He exhibits no edema and no tenderness.  Lymphadenopathy:    He has no cervical adenopathy.  Neurological: He is alert. He has normal reflexes. No cranial nerve deficit. Coordination normal.  Skin: Skin is warm and dry. No rash noted.  Psychiatric: He has a normal mood and affect. His behavior is normal.          Assessment & Plan:   Preventive health examination Carotid artery disease.  We'll followup a carotid artery study since it has been 2 years Hypertension well controlled Dyslipidemia.  Continue statin therapy History prostate cancer.  Followup urology  Recheck 6 months

## 2013-06-20 NOTE — Progress Notes (Signed)
Pre-visit discussion using our clinic review tool. No additional management support is needed unless otherwise documented below in the visit note.  

## 2013-06-25 ENCOUNTER — Encounter (HOSPITAL_COMMUNITY): Payer: Medicare Other

## 2013-06-26 ENCOUNTER — Ambulatory Visit (HOSPITAL_COMMUNITY): Payer: Medicare Other | Attending: Internal Medicine | Admitting: Cardiology

## 2013-06-26 DIAGNOSIS — I658 Occlusion and stenosis of other precerebral arteries: Secondary | ICD-10-CM | POA: Diagnosis not present

## 2013-06-26 DIAGNOSIS — Z8673 Personal history of transient ischemic attack (TIA), and cerebral infarction without residual deficits: Secondary | ICD-10-CM | POA: Insufficient documentation

## 2013-06-26 DIAGNOSIS — I10 Essential (primary) hypertension: Secondary | ICD-10-CM | POA: Diagnosis not present

## 2013-06-26 DIAGNOSIS — I6529 Occlusion and stenosis of unspecified carotid artery: Secondary | ICD-10-CM | POA: Insufficient documentation

## 2013-06-26 DIAGNOSIS — E785 Hyperlipidemia, unspecified: Secondary | ICD-10-CM | POA: Insufficient documentation

## 2013-06-26 DIAGNOSIS — Z87891 Personal history of nicotine dependence: Secondary | ICD-10-CM | POA: Diagnosis not present

## 2013-06-26 NOTE — Progress Notes (Signed)
Carotid duplex completed 

## 2013-06-30 DIAGNOSIS — C61 Malignant neoplasm of prostate: Secondary | ICD-10-CM | POA: Diagnosis not present

## 2013-06-30 DIAGNOSIS — N3020 Other chronic cystitis without hematuria: Secondary | ICD-10-CM | POA: Diagnosis not present

## 2013-07-04 ENCOUNTER — Telehealth: Payer: Self-pay | Admitting: *Deleted

## 2013-07-04 DIAGNOSIS — R3915 Urgency of urination: Secondary | ICD-10-CM | POA: Diagnosis not present

## 2013-07-04 DIAGNOSIS — N3020 Other chronic cystitis without hematuria: Secondary | ICD-10-CM | POA: Diagnosis not present

## 2013-07-04 DIAGNOSIS — R3129 Other microscopic hematuria: Secondary | ICD-10-CM | POA: Diagnosis not present

## 2013-07-04 DIAGNOSIS — R350 Frequency of micturition: Secondary | ICD-10-CM | POA: Diagnosis not present

## 2013-07-04 NOTE — Telephone Encounter (Signed)
Per Dr Linus Salmons called the patient and advised her of either way is fine. She advised she will take it 3 gram once then repeat in 3 days. Advised her if the patient is still having symptoms she should have him retested in about a week.

## 2013-07-04 NOTE — Telephone Encounter (Signed)
Patient daughter called and advised that the patient has a UTI. He was seen at the urologist and was told to take the Fosfomycin 3 G pack once daily for 3 days. She advised the Rx given to them by Dr Tommy Medal to hold was for 3 G pack once and she is concerned. She advised she would like Dr Tommy Medal to advise them what to do as she feels better with his opinion. Advised her the doctor is out of the office but that I will send him a message and once he responds will call her back.

## 2013-07-04 NOTE — Telephone Encounter (Signed)
Either are acceptable options, Dr. Tommy Medal suggested 3 grams and repeat after 3 days.  That should work.

## 2013-07-14 DIAGNOSIS — R3129 Other microscopic hematuria: Secondary | ICD-10-CM | POA: Diagnosis not present

## 2013-07-14 DIAGNOSIS — R3915 Urgency of urination: Secondary | ICD-10-CM | POA: Diagnosis not present

## 2013-07-14 DIAGNOSIS — R350 Frequency of micturition: Secondary | ICD-10-CM | POA: Diagnosis not present

## 2013-07-14 DIAGNOSIS — N3020 Other chronic cystitis without hematuria: Secondary | ICD-10-CM | POA: Diagnosis not present

## 2013-07-15 ENCOUNTER — Telehealth: Payer: Self-pay | Admitting: Internal Medicine

## 2013-07-16 NOTE — Telephone Encounter (Signed)
Error/gd °

## 2013-08-12 DIAGNOSIS — H348192 Central retinal vein occlusion, unspecified eye, stable: Secondary | ICD-10-CM | POA: Diagnosis not present

## 2013-08-12 DIAGNOSIS — H353190 Nonexudative age-related macular degeneration, unspecified eye, stage unspecified: Secondary | ICD-10-CM | POA: Diagnosis not present

## 2013-08-12 DIAGNOSIS — H348392 Tributary (branch) retinal vein occlusion, unspecified eye, stable: Secondary | ICD-10-CM | POA: Diagnosis not present

## 2013-08-12 DIAGNOSIS — H35039 Hypertensive retinopathy, unspecified eye: Secondary | ICD-10-CM | POA: Diagnosis not present

## 2013-08-12 DIAGNOSIS — H3581 Retinal edema: Secondary | ICD-10-CM | POA: Diagnosis not present

## 2013-08-12 DIAGNOSIS — H35059 Retinal neovascularization, unspecified, unspecified eye: Secondary | ICD-10-CM | POA: Diagnosis not present

## 2013-08-26 DIAGNOSIS — H3581 Retinal edema: Secondary | ICD-10-CM | POA: Diagnosis not present

## 2013-08-26 DIAGNOSIS — H348392 Tributary (branch) retinal vein occlusion, unspecified eye, stable: Secondary | ICD-10-CM | POA: Diagnosis not present

## 2013-09-08 ENCOUNTER — Encounter: Payer: Self-pay | Admitting: Internal Medicine

## 2013-09-08 ENCOUNTER — Ambulatory Visit (INDEPENDENT_AMBULATORY_CARE_PROVIDER_SITE_OTHER): Payer: Medicare Other | Admitting: Internal Medicine

## 2013-09-08 VITALS — BP 140/60 | HR 64 | Temp 98.2°F | Resp 20 | Ht 64.5 in | Wt 161.0 lb

## 2013-09-08 DIAGNOSIS — I10 Essential (primary) hypertension: Secondary | ICD-10-CM | POA: Diagnosis not present

## 2013-09-08 DIAGNOSIS — N189 Chronic kidney disease, unspecified: Secondary | ICD-10-CM

## 2013-09-08 DIAGNOSIS — I6529 Occlusion and stenosis of unspecified carotid artery: Secondary | ICD-10-CM

## 2013-09-08 NOTE — Progress Notes (Signed)
Subjective:    Patient ID: Alexander Mcdonald, male    DOB: February 11, 1926, 80 y.o.   MRN: [id number]  HPI      BP Readings from Last 3 Encounters:  09/08/13 140/60  06/20/13 120/60  06/06/13 12/55    80 year old patient who has a history of hypertension, controlled on dual medication.  He is accompanied by his daughter today who is concerned about elevated systolic blood pressure readings occasionally in the 150-170 range.  He generally feels well, although some weakness and deconditioning.  Remains fairly inactive.  He is followed by ID and urology d ue to vs colonization.  Past Medical History  Diagnosis Date  . ACUTE RENAL FAILURE W/LESION OF TUBULAR NECROSIS 01/04/2007  . CAROTID ARTERY DISEASE 06/01/2009  . DEPRESSION 07/25/2006  . DIVERTICULOSIS OF COLON 02/01/2007  . GOUT 07/25/2006  . HEMORRHOID, THROMBOSED 02/15/2009  . HYPERCHOLESTEROLEMIA 07/25/2006  . HYPERTENSION 07/25/2006  . PROSTATE CANCER, HX OF 07/25/2006  . Rosacea 09/28/2008  . WEIGHT LOSS 09/01/2009  . Glaucoma     History   Social History  . Marital Status: Married    Spouse Name: N/A    Number of Children: N/A  . Years of Education: N/A   Occupational History  . Not on file.   Social History Main Topics  . Smoking status: Former Smoker    Quit date: 01/17/1988  . Smokeless tobacco: Never Used  . Alcohol Use: 3.5 oz/week    7 drink(s) per week     Comment: beer  . Drug Use: No  . Sexual Activity: No   Other Topics Concern  . Not on file   Social History Narrative  . No narrative on file    Past Surgical History  Procedure Laterality Date  . Carotid endarterectomy    . Cataract extraction      History reviewed. No pertinent family history.  Allergies  Allergen Reactions  . Shellfish Allergy Anaphylaxis    Pt is allergic to mussels     Current Outpatient Prescriptions on File Prior to Visit  Medication Sig Dispense Refill  . amLODipine (NORVASC) 5 MG tablet TAKE 1 TABLET BY MOUTH EVERY DAY  90  tablet  3  . fish oil-omega-3 fatty acids 1000 MG capsule Take 1 g by mouth daily.      . fosinopril (MONOPRIL) 20 MG tablet TAKE 1 TABLET BY MOUTH DAILY  90 tablet  3  . LUMIGAN 0.01 % SOLN Place 1 drop into the left eye at bedtime.       . mirabegron ER (MYRBETRIQ) 25 MG TB24 tablet Take 1 tablet (25 mg total) by mouth daily.  90 tablet  3  . tamsulosin (FLOMAX) 0.4 MG CAPS capsule TAKE ONE CAPSULE BY MOUTH EVERY DAY  90 capsule  3  . VYTORIN 10-20 MG per tablet TAKE 1 TABLET BY MOUTH EVERY NIGHT AT BEDTIME.  30 tablet  2   No current facility-administered medications on file prior to visit.    BP 140/60  Pulse 64  Temp(Src) 98.2 F (36.8 C) (Oral)  Resp 20  Ht 5' 4.5" (1.638 m)  Wt 161 lb (73.029 kg)  BMI 27.22 kg/m2  SpO2 97%Hypertension   Lab Results  Component Value Date   BUN 31* 06/04/2013      Review of Systems  Constitutional: Positive for fatigue. Negative for fever, chills and appetite change.  HENT: Negative for congestion, dental problem, ear pain, hearing loss, sore throat, tinnitus, trouble swallowing and voice change.  Eyes: Negative for pain, discharge and visual disturbance.  Respiratory: Negative for cough, chest tightness, wheezing and stridor.   Cardiovascular: Negative for chest pain, palpitations and leg swelling.  Gastrointestinal: Negative for nausea, vomiting, abdominal pain, diarrhea, constipation, blood in stool and abdominal distention.  Genitourinary: Positive for frequency. Negative for urgency, hematuria, flank pain, discharge, difficulty urinating and genital sores.  Musculoskeletal: Negative for arthralgias, back pain, gait problem, joint swelling, myalgias and neck stiffness.  Skin: Negative for rash.  Neurological: Positive for weakness. Negative for dizziness, syncope, speech difficulty, numbness and headaches.  Hematological: Negative for adenopathy. Does not bruise/bleed easily.  Psychiatric/Behavioral: Negative for behavioral problems  and dysphoric mood. The patient is not nervous/anxious.        Objective:   Physical Exam  Constitutional: He is oriented to person, place, and time. He appears well-developed.  Blood pressure 140/60 both arms  HENT:  Head: Normocephalic.  Right Ear: External ear normal.  Left Ear: External ear normal.  Eyes: Conjunctivae and EOM are normal.  Neck: Normal range of motion.  Cardiovascular: Normal rate and normal heart sounds.   Pulmonary/Chest: Breath sounds normal.  Abdominal: Bowel sounds are normal.  Musculoskeletal: Normal range of motion. He exhibits no edema and no tenderness.  Neurological: He is alert and oriented to person, place, and time.  Psychiatric: He has a normal mood and affect. His behavior is normal.          Assessment & Plan:   Hypertension.  Well controlled.  No change in therapy Carotid artery disease, stable.  We'll recheck in one year Chronic kidney disease  Recheck 3 months with lab

## 2013-09-08 NOTE — Progress Notes (Signed)
Pre visit review using our clinic review tool, if applicable. No additional management support is needed unless otherwise documented below in the visit note. 

## 2013-09-08 NOTE — Patient Instructions (Signed)
Limit your sodium (Salt) intake  Please check your blood pressure on a regular basis.  If it is consistently greater than 170/90, please make an office appointment.  Return in 6 months for follow-up

## 2013-09-10 ENCOUNTER — Ambulatory Visit: Payer: Medicare Other | Admitting: Infectious Disease

## 2013-09-16 ENCOUNTER — Other Ambulatory Visit: Payer: Self-pay | Admitting: Internal Medicine

## 2013-09-18 ENCOUNTER — Ambulatory Visit (INDEPENDENT_AMBULATORY_CARE_PROVIDER_SITE_OTHER): Payer: Medicare Other | Admitting: Infectious Disease

## 2013-09-18 ENCOUNTER — Encounter: Payer: Self-pay | Admitting: Infectious Disease

## 2013-09-18 VITALS — BP 175/66 | HR 64 | Temp 97.6°F | Wt 164.2 lb

## 2013-09-18 DIAGNOSIS — N3020 Other chronic cystitis without hematuria: Secondary | ICD-10-CM | POA: Diagnosis not present

## 2013-09-18 DIAGNOSIS — R8271 Bacteriuria: Secondary | ICD-10-CM

## 2013-09-18 DIAGNOSIS — R82998 Other abnormal findings in urine: Secondary | ICD-10-CM

## 2013-09-18 DIAGNOSIS — N390 Urinary tract infection, site not specified: Secondary | ICD-10-CM | POA: Diagnosis not present

## 2013-09-18 DIAGNOSIS — I6529 Occlusion and stenosis of unspecified carotid artery: Secondary | ICD-10-CM | POA: Diagnosis not present

## 2013-09-18 DIAGNOSIS — N3289 Other specified disorders of bladder: Secondary | ICD-10-CM

## 2013-09-18 MED ORDER — FOSFOMYCIN TROMETHAMINE 3 G PO PACK: 3.0000 g | PACK | Freq: Once | ORAL | Status: DC

## 2013-09-18 NOTE — Progress Notes (Signed)
Subjective:    Patient ID: Alexander Mcdonald, male    DOB: 11/25/1926, 80 y.o.   MRN: [id number]  HPI   80 year old man with hx significant for CKD, gout, prostate cancer who has had recurrent symptoms of dysuria, polyuria x 2 years. He has been followed recently by [facility] by Steele Berg and Kathie Rhodes. He previously would have resolution of his symptoms with use of antibiotic and antispasmodic but now continues to have recurrence of ssx shortly after stopping both antibiotic and antispasmodic.   Recently he has repeatedly grown Pseudomonas Aeruginosa from his urine, most recently on 03/30/13 when he grew 75K R to cipro/levo, I to [place] but S to zosyn, IMI, Ceftaz and Cefepime.   He had continued to receive Cefdinir for this despite the fact that this cephalosporin has ZERO ANTI-PSEUDOMONAL activity.   IN talking to me it was not clear that he actually is suffering from genuine UTI's though he does have ssx of dysuria, polyruria and nausea occasionally with vomiting and even excessive sleeping. If these DO correspond to UTI's with Pseudomonas then he must be actually managing to survive these episodes without EFFECTIVE abx over this time period.  He has had no fevers, chills, weight loss or syncope or light headedness.      I saw him clinic for first time in March and did UA which was with 0-2 WBC. Urine culture only yielded 55k Pseudomonas Aeruginosa and he has in fact improved clinically since I saw him last week. He has had one epidsode of dysuria this am early but this has subsequently cleared.  I saw him a week later and he was doing fine and I encouraged use of antispasmodics and avoidance of antibiotics. SInce then he has been seen by PCP who has given him rx for [facility] and he has had DRAMATIC improvement in urinary spasm ssx. He had repeat UA and urine culture that was completely sterile. He has not resumed any abx. He has no systemic ssx.  Since I last saw him he had  stopped the [facility] and shrtly after stopping it began having ssx of dysuria, increasing frequency but apparently also less appetite though no fevers. He was seen by Urology where UA did not show WBC per daughter but Urine culture grew an organism. He took the fosfomycin and then had more symptoms a few weeks later and was again treated with fosfomycin. Currently with minimal ssx with MYRBETRIQ.  Review of Systems  Constitutional: Positive for fatigue. Negative for fever, chills, diaphoresis, activity change, appetite change and unexpected weight change.  HENT: Negative for congestion.   Eyes: Negative for photophobia.  Respiratory: Positive for shortness of breath. Negative for cough, chest tightness, wheezing and stridor.   Cardiovascular: Negative for chest pain.  Gastrointestinal: Positive for nausea. Negative for vomiting, abdominal pain, diarrhea, constipation and abdominal distention.  Genitourinary: Negative for dysuria, frequency, hematuria, flank pain and difficulty urinating.  Musculoskeletal: Negative for arthralgias and back pain.  Skin: Negative for color change and pallor.  Neurological: Negative for tremors.  Psychiatric/Behavioral: Negative for sleep disturbance and dysphoric mood.       Objective:   Physical Exam  Nursing note and vitals reviewed. Constitutional: No distress.  HENT:  Head: Normocephalic and atraumatic.  Mouth/Throat: Oropharynx is clear and moist. No oropharyngeal exudate.  Eyes: Conjunctivae and EOM are normal. No scleral icterus.  Neck: Normal range of motion. Neck supple. No JVD present.  Cardiovascular: Normal rate, regular rhythm and normal  heart sounds.  Exam reveals no gallop and no friction rub.   No murmur heard. Pulmonary/Chest: Effort normal and breath sounds normal. No respiratory distress. He has no wheezes. He has no rales. He exhibits no tenderness.  Abdominal: He exhibits no distension and no mass. There is no tenderness. There is no  rebound and no guarding.  Musculoskeletal: He exhibits no edema and no tenderness.  Lymphadenopathy:    He has no cervical adenopathy.  Neurological: He is alert. He exhibits normal muscle tone. Coordination normal.  Skin: Skin is warm and dry. He is not diaphoretic. No erythema. No pallor.  Psychiatric: He has a normal mood and affect. His behavior is normal. Judgment and thought content normal.          Assessment & Plan:   #1 Chronic cystitis:  --continue his antispasmodics --continue to AVOID unnecessary antibiotics and avoid over-treating him with abx, the longer without abx the BETTER --I have provided him a rx for fosfomycin to use if he has convincing ssx again and sterile cup to collect urine in if he has ssx that he can collect sample if after hours to bring in for culture after keeping in refridgerator overnight (fridge should temorize bacterial growth overnight)  rtc in 3 months I spent greater than 25 minutes with the patient including greater than 50% of time in face to face counsel of the patient and in coordination of their care.       #2 Prostate cancer: not on rx, observatino

## 2013-09-30 DIAGNOSIS — C61 Malignant neoplasm of prostate: Secondary | ICD-10-CM | POA: Diagnosis not present

## 2013-10-10 ENCOUNTER — Other Ambulatory Visit: Payer: Self-pay | Admitting: Internal Medicine

## 2013-10-13 DIAGNOSIS — C61 Malignant neoplasm of prostate: Secondary | ICD-10-CM | POA: Diagnosis not present

## 2013-10-13 DIAGNOSIS — R351 Nocturia: Secondary | ICD-10-CM | POA: Diagnosis not present

## 2013-10-14 DIAGNOSIS — H353290 Exudative age-related macular degeneration, unspecified eye, stage unspecified: Secondary | ICD-10-CM | POA: Diagnosis not present

## 2013-10-14 DIAGNOSIS — H35059 Retinal neovascularization, unspecified, unspecified eye: Secondary | ICD-10-CM | POA: Diagnosis not present

## 2013-10-14 DIAGNOSIS — H35359 Cystoid macular degeneration, unspecified eye: Secondary | ICD-10-CM | POA: Diagnosis not present

## 2013-10-14 DIAGNOSIS — H348192 Central retinal vein occlusion, unspecified eye, stable: Secondary | ICD-10-CM | POA: Diagnosis not present

## 2013-10-14 DIAGNOSIS — H348392 Tributary (branch) retinal vein occlusion, unspecified eye, stable: Secondary | ICD-10-CM | POA: Diagnosis not present

## 2013-10-20 DIAGNOSIS — H44511 Absolute glaucoma, right eye: Secondary | ICD-10-CM | POA: Diagnosis not present

## 2013-10-20 DIAGNOSIS — H4011X3 Primary open-angle glaucoma, severe stage: Secondary | ICD-10-CM | POA: Diagnosis not present

## 2013-10-20 DIAGNOSIS — H34832 Tributary (branch) retinal vein occlusion, left eye: Secondary | ICD-10-CM | POA: Diagnosis not present

## 2013-11-11 DIAGNOSIS — H35352 Cystoid macular degeneration, left eye: Secondary | ICD-10-CM | POA: Diagnosis not present

## 2013-11-11 DIAGNOSIS — H3532 Exudative age-related macular degeneration: Secondary | ICD-10-CM | POA: Diagnosis not present

## 2013-11-11 DIAGNOSIS — H35052 Retinal neovascularization, unspecified, left eye: Secondary | ICD-10-CM | POA: Diagnosis not present

## 2013-11-11 DIAGNOSIS — H34832 Tributary (branch) retinal vein occlusion, left eye: Secondary | ICD-10-CM | POA: Diagnosis not present

## 2013-12-16 ENCOUNTER — Telehealth: Payer: Self-pay | Admitting: Internal Medicine

## 2013-12-16 NOTE — Telephone Encounter (Signed)
The eye specialist sent letter to dr k over a month ago and has not heard anything for dr Raliegh Ip. Pt' s appt is next tues, 12/8.  Dr Everett Graff (?) Daughter would like a cb

## 2013-12-17 ENCOUNTER — Other Ambulatory Visit (INDEPENDENT_AMBULATORY_CARE_PROVIDER_SITE_OTHER): Payer: Medicare Other

## 2013-12-17 ENCOUNTER — Ambulatory Visit: Payer: Medicare Other | Admitting: Internal Medicine

## 2013-12-17 DIAGNOSIS — N390 Urinary tract infection, site not specified: Secondary | ICD-10-CM | POA: Diagnosis not present

## 2013-12-17 DIAGNOSIS — R509 Fever, unspecified: Secondary | ICD-10-CM

## 2013-12-17 LAB — CBC WITH DIFFERENTIAL/PLATELET
Basophils Absolute: 0 10*3/uL (ref 0.0–0.1)
Basophils Relative: 0.5 % (ref 0.0–3.0)
Eosinophils Absolute: 0.2 10*3/uL (ref 0.0–0.7)
Eosinophils Relative: 2.6 % (ref 0.0–5.0)
HCT: 41 % (ref 39.0–52.0)
Hemoglobin: 13.6 g/dL (ref 13.0–17.0)
Lymphocytes Relative: 20 % (ref 12.0–46.0)
Lymphs Abs: 1.7 10*3/uL (ref 0.7–4.0)
MCHC: 33.2 g/dL (ref 30.0–36.0)
MCV: 90.2 fl (ref 78.0–100.0)
Monocytes Absolute: 0.8 10*3/uL (ref 0.1–1.0)
Monocytes Relative: 9.1 % (ref 3.0–12.0)
Neutro Abs: 5.7 10*3/uL (ref 1.4–7.7)
Neutrophils Relative %: 67.8 % (ref 43.0–77.0)
Platelets: 181 10*3/uL (ref 150.0–400.0)
RBC: 4.54 Mil/uL (ref 4.22–5.81)
RDW: 13.5 % (ref 11.5–15.5)
WBC: 8.4 10*3/uL (ref 4.0–10.5)

## 2013-12-17 LAB — POCT URINALYSIS DIPSTICK
Bilirubin, UA: NEGATIVE
Blood, UA: NEGATIVE
Glucose, UA: NEGATIVE
Ketones, UA: NEGATIVE
Nitrite, UA: NEGATIVE
Protein, UA: NEGATIVE
Spec Grav, UA: <=1.005
Urobilinogen, UA: 0.2
pH, UA: 5.5

## 2013-12-17 NOTE — Telephone Encounter (Signed)
Please see message. °

## 2013-12-17 NOTE — Telephone Encounter (Signed)
Attempted to speak with [name], who was not available.  Did talk with the patient who will be by this afternoon for a CBC.  Please fax CBC report to Dr. Silvestre Gunner

## 2013-12-17 NOTE — Telephone Encounter (Signed)
Spoke with [name] (pt's daughter) and she would like to know if Dr. Raliegh Ip and Dr. Silvestre Gunner could speak about father's history.  Advised pt's daughter that Dr. Raliegh Ip would be out of the office for 3 weeks.  If pt is able to get labs done today then we can have another physician read the lab work.  Advised that due to pt's history another physican would not be ideal to discuss pt's letter with Dr. Silvestre Gunner. Pt's daughter verbalized understanding.

## 2013-12-17 NOTE — Telephone Encounter (Signed)
Per Dr. Raliegh Ip pt needs to come into the office this week or next for a CBC.  Left a message for return call.

## 2013-12-18 NOTE — Telephone Encounter (Signed)
Labs faxed to [facility].

## 2013-12-18 NOTE — Telephone Encounter (Signed)
Confirmation received.

## 2013-12-19 NOTE — Telephone Encounter (Signed)
Pls advise on pt's urine results. Thanks

## 2013-12-19 NOTE — Telephone Encounter (Signed)
Phone patient to see if he has UTI symptoms. If not recollect at next OV and send for culture.

## 2013-12-19 NOTE — Telephone Encounter (Signed)
Called and spoke with Alexander Mcdonald.  Advised of [name]'s recommendations. She states her father had not told her he was having urinary symptoms. Advised pt's daughter to watch pt for symptoms over the weekend and for pt to drink plenty of fluids and cranberry juice.

## 2013-12-19 NOTE — Telephone Encounter (Signed)
Left a message for pt's daughter [name] to return call.

## 2013-12-22 ENCOUNTER — Ambulatory Visit: Payer: Medicare Other | Admitting: Internal Medicine

## 2013-12-23 DIAGNOSIS — H3532 Exudative age-related macular degeneration: Secondary | ICD-10-CM | POA: Diagnosis not present

## 2013-12-23 DIAGNOSIS — H35053 Retinal neovascularization, unspecified, bilateral: Secondary | ICD-10-CM | POA: Diagnosis not present

## 2014-01-06 DIAGNOSIS — H34832 Tributary (branch) retinal vein occlusion, left eye: Secondary | ICD-10-CM | POA: Diagnosis not present

## 2014-01-06 DIAGNOSIS — H4011X3 Primary open-angle glaucoma, severe stage: Secondary | ICD-10-CM | POA: Diagnosis not present

## 2014-01-06 DIAGNOSIS — H3532 Exudative age-related macular degeneration: Secondary | ICD-10-CM | POA: Diagnosis not present

## 2014-01-06 DIAGNOSIS — H35352 Cystoid macular degeneration, left eye: Secondary | ICD-10-CM | POA: Diagnosis not present

## 2014-01-30 ENCOUNTER — Telehealth (INDEPENDENT_AMBULATORY_CARE_PROVIDER_SITE_OTHER): Payer: Medicare Other | Admitting: Internal Medicine

## 2014-01-30 ENCOUNTER — Telehealth: Payer: Self-pay | Admitting: Licensed Clinical Social Worker

## 2014-01-30 ENCOUNTER — Other Ambulatory Visit: Payer: Medicare Other

## 2014-01-30 DIAGNOSIS — R300 Dysuria: Secondary | ICD-10-CM | POA: Diagnosis not present

## 2014-01-30 LAB — POCT URINALYSIS DIPSTICK
Bilirubin, UA: NEGATIVE
Glucose, UA: NEGATIVE
Ketones, UA: NEGATIVE
Nitrite, UA: NEGATIVE
Spec Grav, UA: 1.01
Urobilinogen, UA: 0.2
pH, UA: 6.5

## 2014-01-30 NOTE — Telephone Encounter (Signed)
Very good 

## 2014-01-30 NOTE — Telephone Encounter (Signed)
Pt has been sch

## 2014-01-30 NOTE — Telephone Encounter (Signed)
She can take rx for fosfomycin 3 grams once and then repeat in 3 days

## 2014-01-30 NOTE — Telephone Encounter (Signed)
Please schedule pt for lab appt to come an give a urine to be checked. I put order in EPIC.

## 2014-01-30 NOTE — Telephone Encounter (Signed)
Patient is having urgency, frequency and burning with urination. Please advise. Patient also notified pcp to see if they can help.

## 2014-01-30 NOTE — Patient Instructions (Signed)
Discussed urine results with Dr. Raliegh Ip and told him pt was given antibiotic to start but daughter wanted to make sure infection. Dr. Raliegh Ip said okay to start antibiotics. Spoke to pt and daughter told her urine shows bacteria, okay to start antibiotic per Dr. Raliegh Ip. Pt and daughter verbalized understanding and stated was given Monopril and has follow up with Infectious Disease on Wed. Told them okay.

## 2014-01-30 NOTE — Telephone Encounter (Signed)
Pt's daughter left voicemail in [facility] Triage.  Pt went to PCP, daughter states urine was positive for infection.  He has 2 packets of monurol from last dispense.  Patient will take 1 packet today, will take the 2nd in 3 days if needed.  He is scheduled for follow up on 1/20.  If need to contact the patient/his daughter over the weekend, can be reached at [phone]. Landis Gandy, RN

## 2014-01-30 NOTE — Telephone Encounter (Signed)
Pt daughter would like to drop off UA samples ?uti. Pt ID md gave him rx. Pt daughter would like to verfiy pt has uti before getting abx fill. Can I sch?

## 2014-02-04 ENCOUNTER — Ambulatory Visit (INDEPENDENT_AMBULATORY_CARE_PROVIDER_SITE_OTHER): Payer: Medicare Other | Admitting: Infectious Disease

## 2014-02-04 VITALS — BP 179/72 | HR 65 | Temp 97.5°F | Wt 174.0 lb

## 2014-02-04 DIAGNOSIS — N390 Urinary tract infection, site not specified: Secondary | ICD-10-CM | POA: Diagnosis not present

## 2014-02-04 DIAGNOSIS — N3020 Other chronic cystitis without hematuria: Secondary | ICD-10-CM | POA: Diagnosis not present

## 2014-02-04 NOTE — Progress Notes (Signed)
Subjective:    Patient ID: Alexander Mcdonald, male    DOB: 1926/06/12, 81 y.o.   MRN: [id number]  HPI   81 year old man with hx significant for CKD, gout, prostate cancer who has had recurrent symptoms of dysuria, polyuria x 2 years. He has been followed recently by [facility] by Steele Berg and Kathie Rhodes. He previously would have resolution of his symptoms with use of antibiotic and antispasmodic but now continues to have recurrence of ssx shortly after stopping both antibiotic and antispasmodic.   Recently he has repeatedly grown Pseudomonas Aeruginosa from his urine, most recently on 03/30/13 when he grew 75K R to cipro/levo, I to [place] but S to zosyn, IMI, Ceftaz and Cefepime.   He had continued to receive Cefdinir for this despite the fact that this cephalosporin has ZERO ANTI-PSEUDOMONAL activity.   IN talking to me it was not clear that he actually is suffering from genuine UTI's though he does have ssx of dysuria, polyruria and nausea occasionally with vomiting and even excessive sleeping. If these DO correspond to UTI's with Pseudomonas then he must be actually managing to survive these episodes without EFFECTIVE abx over this time period.  He has had no fevers, chills, weight loss or syncope or light headedness.      I saw him clinic for first time in March 2015 and did UA which was with 0-2 WBC. Urine culture only yielded 55k Pseudomonas Aeruginosa and he has in fact improved clinically since I saw him last week. He has had one epidsode of dysuria this am early but this has subsequently cleared.  I saw him a week later and he was doing fine and I encouraged use of antispasmodics and avoidance of antibiotics. SInce then he has been seen by PCP who has given him rx for [facility] and he has had DRAMATIC improvement in urinary spasm ssx. He had repeat UA and urine culture that was completely sterile. He has not resumed any abx. He has no systemic ssx.  Since I last saw him he had  stopped the [facility] and shrtly after stopping it began having ssx of dysuria, increasing frequency but apparently also less appetite though no fevers. He was seen by Urology where UA did not show WBC per daughter but Urine culture grew an organism. He took the fosfomycin and then had more symptoms a few weeks later and was again treated with fosfomycin. He had  minimal ssx with MYRBETRIQ when last seen.  Last week he had NEW worsening of symptoms beyond what he typically has with frequency urgency, dysuria beyond what he typically experiences. He came to [facility] where UA showed pyuria but no cultures done. He took the fosfomycin that we had given him rx for before and repeated a dose in 3 days. Feels much better today.  Review of Systems  Constitutional: Negative for fever, chills, diaphoresis, activity change, appetite change and unexpected weight change.  HENT: Negative for congestion.   Eyes: Negative for photophobia.  Respiratory: Negative for cough, chest tightness, wheezing and stridor.   Cardiovascular: Negative for chest pain.  Gastrointestinal: Positive for nausea. Negative for vomiting, abdominal pain, diarrhea, constipation and abdominal distention.  Genitourinary: Positive for urgency and difficulty urinating. Negative for dysuria, frequency, hematuria and flank pain.  Musculoskeletal: Negative for back pain and arthralgias.  Skin: Negative for color change and pallor.  Neurological: Negative for tremors.  Psychiatric/Behavioral: Negative for sleep disturbance and dysphoric mood.       Objective:  Physical Exam  Constitutional: No distress.  HENT:  Head: Normocephalic and atraumatic.  Mouth/Throat: Oropharynx is clear and moist. No oropharyngeal exudate.  Eyes: Conjunctivae and EOM are normal. No scleral icterus.  Neck: Normal range of motion. Neck supple. No JVD present.  Cardiovascular: Normal rate, regular rhythm and normal heart sounds.  Exam reveals no gallop and no  friction rub.   No murmur heard. Pulmonary/Chest: Effort normal and breath sounds normal. No respiratory distress. He has no wheezes. He has no rales. He exhibits no tenderness.  Abdominal: He exhibits no distension and no mass. There is no tenderness. There is no rebound and no guarding.  Musculoskeletal: He exhibits no edema or tenderness.  Lymphadenopathy:    He has no cervical adenopathy.  Neurological: He is alert. He exhibits normal muscle tone. Coordination normal.  Skin: Skin is warm and dry. He is not diaphoretic. No erythema. No pallor.  Psychiatric: He has a normal mood and affect. His behavior is normal. Judgment and thought content normal.  Nursing note and vitals reviewed.         Assessment & Plan:   #1 Chronic cystitis:  --continue his antispasmodics --continue to AVOID unnecessary antibiotics and avoid over-treating him with abx, the longer without abx the BETTER -he responded to the fosfomycin we gave him rx for --continue to be vigilant about rx, he had gone since July without rx for UTI which is certainly an accomplishment

## 2014-02-19 DIAGNOSIS — H34811 Central retinal vein occlusion, right eye: Secondary | ICD-10-CM | POA: Diagnosis not present

## 2014-02-19 DIAGNOSIS — H44511 Absolute glaucoma, right eye: Secondary | ICD-10-CM | POA: Diagnosis not present

## 2014-02-19 DIAGNOSIS — H34832 Tributary (branch) retinal vein occlusion, left eye: Secondary | ICD-10-CM | POA: Diagnosis not present

## 2014-02-19 DIAGNOSIS — H35352 Cystoid macular degeneration, left eye: Secondary | ICD-10-CM | POA: Diagnosis not present

## 2014-02-23 DIAGNOSIS — H44511 Absolute glaucoma, right eye: Secondary | ICD-10-CM | POA: Diagnosis not present

## 2014-02-23 DIAGNOSIS — H4011X3 Primary open-angle glaucoma, severe stage: Secondary | ICD-10-CM | POA: Diagnosis not present

## 2014-02-27 DIAGNOSIS — H4011X3 Primary open-angle glaucoma, severe stage: Secondary | ICD-10-CM | POA: Diagnosis not present

## 2014-03-17 DIAGNOSIS — H34832 Tributary (branch) retinal vein occlusion, left eye: Secondary | ICD-10-CM | POA: Diagnosis not present

## 2014-03-17 DIAGNOSIS — H44511 Absolute glaucoma, right eye: Secondary | ICD-10-CM | POA: Diagnosis not present

## 2014-03-17 DIAGNOSIS — H4011X3 Primary open-angle glaucoma, severe stage: Secondary | ICD-10-CM | POA: Diagnosis not present

## 2014-04-03 DIAGNOSIS — H3532 Exudative age-related macular degeneration: Secondary | ICD-10-CM | POA: Diagnosis not present

## 2014-04-03 DIAGNOSIS — H44511 Absolute glaucoma, right eye: Secondary | ICD-10-CM | POA: Diagnosis not present

## 2014-04-03 DIAGNOSIS — H34811 Central retinal vein occlusion, right eye: Secondary | ICD-10-CM | POA: Diagnosis not present

## 2014-04-03 DIAGNOSIS — H34832 Tributary (branch) retinal vein occlusion, left eye: Secondary | ICD-10-CM | POA: Diagnosis not present

## 2014-04-03 DIAGNOSIS — H35352 Cystoid macular degeneration, left eye: Secondary | ICD-10-CM | POA: Diagnosis not present

## 2014-04-06 ENCOUNTER — Other Ambulatory Visit: Payer: Self-pay | Admitting: Internal Medicine

## 2014-04-06 DIAGNOSIS — H44511 Absolute glaucoma, right eye: Secondary | ICD-10-CM | POA: Diagnosis not present

## 2014-04-06 DIAGNOSIS — H4011X3 Primary open-angle glaucoma, severe stage: Secondary | ICD-10-CM | POA: Diagnosis not present

## 2014-04-09 DIAGNOSIS — C61 Malignant neoplasm of prostate: Secondary | ICD-10-CM | POA: Diagnosis not present

## 2014-04-17 DIAGNOSIS — C61 Malignant neoplasm of prostate: Secondary | ICD-10-CM | POA: Diagnosis not present

## 2014-05-07 DIAGNOSIS — H3532 Exudative age-related macular degeneration: Secondary | ICD-10-CM | POA: Diagnosis not present

## 2014-06-01 ENCOUNTER — Other Ambulatory Visit: Payer: Self-pay | Admitting: *Deleted

## 2014-06-01 ENCOUNTER — Other Ambulatory Visit: Payer: Medicare Other

## 2014-06-01 DIAGNOSIS — N3020 Other chronic cystitis without hematuria: Secondary | ICD-10-CM

## 2014-06-01 DIAGNOSIS — N3090 Cystitis, unspecified without hematuria: Secondary | ICD-10-CM

## 2014-06-02 LAB — URINALYSIS, ROUTINE W REFLEX MICROSCOPIC
Bilirubin Urine: NEGATIVE
Glucose, UA: NEGATIVE mg/dL
Ketones, ur: NEGATIVE mg/dL
Nitrite: NEGATIVE
Protein, ur: NEGATIVE mg/dL
Specific Gravity, Urine: 1.005 (ref 1.005–1.030)
Urobilinogen, UA: 0.2 mg/dL (ref 0.0–1.0)
pH: 6 (ref 5.0–8.0)

## 2014-06-02 LAB — URINALYSIS, MICROSCOPIC ONLY
Casts: NONE SEEN
Crystals: NONE SEEN
Squamous Epithelial / LPF: NONE SEEN
WBC, UA: >50 WBC/hpf — AB (ref <3)

## 2014-06-03 ENCOUNTER — Telehealth: Payer: Self-pay | Admitting: *Deleted

## 2014-06-03 DIAGNOSIS — N390 Urinary tract infection, site not specified: Secondary | ICD-10-CM

## 2014-06-03 NOTE — Telephone Encounter (Signed)
Daughter wanting to find out results from Urine specimen from 06/01/14.  Wanting to get a "jump on" treatment.  Planning to take pt out-of-town.  Does have a f/u appt w/ Dr. Tommy Medal Thurs., May 18 at 1445.  MD please advise.

## 2014-06-03 NOTE — Telephone Encounter (Signed)
Fosfomycin 3 grams once and repeat in 3 days is OK to try.

## 2014-06-04 ENCOUNTER — Ambulatory Visit (INDEPENDENT_AMBULATORY_CARE_PROVIDER_SITE_OTHER): Payer: Medicare Other | Admitting: Infectious Disease

## 2014-06-04 ENCOUNTER — Encounter: Payer: Self-pay | Admitting: Infectious Disease

## 2014-06-04 VITALS — BP 160/76 | HR 60 | Temp 97.7°F | Wt 174.0 lb

## 2014-06-04 DIAGNOSIS — N3020 Other chronic cystitis without hematuria: Secondary | ICD-10-CM

## 2014-06-04 DIAGNOSIS — A498 Other bacterial infections of unspecified site: Secondary | ICD-10-CM

## 2014-06-04 DIAGNOSIS — N3289 Other specified disorders of bladder: Secondary | ICD-10-CM | POA: Diagnosis not present

## 2014-06-04 DIAGNOSIS — N183 Chronic kidney disease, stage 3 unspecified: Secondary | ICD-10-CM

## 2014-06-04 DIAGNOSIS — N400 Enlarged prostate without lower urinary tract symptoms: Secondary | ICD-10-CM | POA: Diagnosis not present

## 2014-06-04 DIAGNOSIS — N390 Urinary tract infection, site not specified: Secondary | ICD-10-CM

## 2014-06-04 HISTORY — DX: Other specified disorders of bladder: N32.89

## 2014-06-04 HISTORY — DX: Benign prostatic hyperplasia without lower urinary tract symptoms: N40.0

## 2014-06-04 LAB — URINE CULTURE: Colony Count: >=100000

## 2014-06-04 MED ORDER — FOSFOMYCIN TROMETHAMINE 3 G PO PACK: 3.0000 g | PACK | Freq: Once | ORAL | Status: DC

## 2014-06-04 NOTE — Progress Notes (Signed)
Subjective:    Patient ID: Alexander Mcdonald, male    DOB: [birth year], 81 y.o.   MRN: [id number]  Urinary Tract Infection  This is a recurrent problem. The current episode started in the past 7 days. The problem occurs intermittently. The problem has been gradually improving. The quality of the pain is described as burning. The pain is at a severity of 3/10. There has been no fever. He is not sexually active. There is no history of pyelonephritis. Associated symptoms include urgency. Pertinent negatives include no chills, flank pain, frequency, hematuria, nausea or vomiting. He has tried antibiotics for the symptoms. The treatment provided significant relief. His past medical history is significant for recurrent UTIs and a urological procedure.    81 year old man with hx significant for CKD, gout, prostate cancer who has had recurrent symptoms of dysuria, polyuria x 3 years. He has been followed recently by [facility] by Steele Berg and Kathie Rhodes. He previously would have resolution of his symptoms with use of antibiotic and antispasmodic but now continues to have recurrence of ssx shortly after stopping both antibiotic and antispasmodic.   He had repeatedly grown Pseudomonas Aeruginosa from his urine,  on 03/30/13 when he grew 75K R to cipro/levo, I to [place] but S to zosyn, IMI, Ceftaz and Cefepime.   He had continued to receive Cefdinir for this despite the fact that this cephalosporin has ZERO ANTI-PSEUDOMONAL activity.   IN talking to me it was not clear that he actually is suffering from genuine UTI's though he does have ssx of dysuria, polyruria and nausea occasionally with vomiting and even excessive sleeping. If these DO correspond to UTI's with Pseudomonas then he must be actually managing to survive these episodes without EFFECTIVE abx over this time period.  He has had no fevers, chills, weight loss or syncope or light headedness.      I saw him clinic for first time in March  2015 and did UA which was with 0-2 WBC. Urine culture only yielded 55k Pseudomonas Aeruginosa and he had in fact improved clinically since I saw him last week. He has had one epidsode of dysuria this am early but this has subsequently cleared.  I saw him a week later and he was doing fine and I encouraged use of antispasmodics and avoidance of antibiotics. SInce then he has been seen by PCP who has given him rx for [facility] and he has had DRAMATIC improvement in urinary spasm ssx. He had repeat UA and urine culture that was completely sterile. He has not resumed any abx. He has no systemic ssx.  At one point he had  stopped the [facility] and shortly after stopping it began having ssx of dysuria, increasing frequency but apparently also less appetite though no fevers. He was seen by Urology where UA did not show WBC per daughter but Urine culture grew an organism. He took the fosfomycin and then had more symptoms a few weeks later and was again treated with fosfomycin. He had  minimal ssx with MYRBETRIQ when last seen.  In mid April he had NEW worsening of symptoms beyond what he typically has with frequency urgency, dysuria beyond what he typically experiences.   His daughter gave him a dose of fosfomycin with improvement of his symptoms. He also began taking "URIBEL" and symptoms resolved after taking 2 doses of fosfomycin and with the uribel. Then roughly week after stopping these he had recurrence of dysuria and a urine sample with analysis  and culture was dropped off in our clinic last week. Urinalysis did show copious white blood cells and culture eventually grew pseudomonas aeruginosa resistant to levofloxacin and ciprofloxacin but sensitive to other antipseudomonal agents. He had started back on Uribel and I also in the interim (not knowing he had taken fosfomycin already and (recommended him start fosfomycin again. Today presents clinic for follow-up and states he feels "better than average at".  According the daughter he is improved today versus yesterday. We discussed very as things that we could do and I would recommend for now continue on with fosfomycin for an additional 2 doses along with the uribel.   He certainly does not seem systemically ill I do not think that his condition warrants hospitalization or placement of PICC line for administration of intravenous antibiotics at this point  Review of Systems  Constitutional: Negative for fever, chills, diaphoresis, activity change, appetite change and unexpected weight change.  HENT: Negative for congestion.   Eyes: Negative for photophobia.  Respiratory: Negative for cough, chest tightness, wheezing and stridor.   Cardiovascular: Negative for chest pain.  Gastrointestinal: Negative for nausea, vomiting, abdominal pain, diarrhea, constipation and abdominal distention.  Genitourinary: Positive for urgency and difficulty urinating. Negative for dysuria, frequency, hematuria and flank pain.  Musculoskeletal: Negative for back pain and arthralgias.  Skin: Negative for color change and pallor.  Neurological: Negative for tremors.  Psychiatric/Behavioral: Negative for sleep disturbance and dysphoric mood.       Objective:   Physical Exam  Constitutional: No distress.  HENT:  Head: Normocephalic and atraumatic.  Mouth/Throat: Oropharynx is clear and moist. No oropharyngeal exudate.  Eyes: Conjunctivae and EOM are normal. No scleral icterus.  Neck: Normal range of motion. Neck supple. No JVD present.  Cardiovascular: Normal rate, regular rhythm and normal heart sounds.  Exam reveals no gallop and no friction rub.   No murmur heard. Pulmonary/Chest: Effort normal and breath sounds normal. No respiratory distress. He has no wheezes. He has no rales. He exhibits no tenderness.  Abdominal: He exhibits no distension and no mass. There is no tenderness. There is no rebound and no guarding.  Musculoskeletal: He exhibits no edema or  tenderness.  Lymphadenopathy:    He has no cervical adenopathy.  Neurological: He is alert. He exhibits normal muscle tone. Coordination normal.  Skin: Skin is warm and dry. He is not diaphoretic. No erythema. No pallor.  Psychiatric: He has a normal mood and affect. His behavior is normal. Judgment and thought content normal.  Nursing note and vitals reviewed.         Assessment & Plan:   #1 Chronic cystitis:  --continue his antispasmodics --continue to AVOID unnecessary antibiotics and avoid over-treating him with abx, the longer without abx the BETTER   We will let him complete another course of fosfomycin. If the Pseudomonas is genuinely a pathogen hopefully it is susceptible to fosfomycin otherwise if he worsens he will require admission for administration of intravenous antibiotics unless this could be accomplished as an outpatient which I would doubt would be appropriate if he is truly SICK  #2 Recurrent UTI: See above Will treat with fosfomycin and also let him take uribel and continue to take this for an additional week beyond finishing his abx.  I spent greater than 25 minutes with the patient including greater than 50% of time in face to face counsel of the patient and his daughter with regards to his symptoms and different causes of symptoms and different medications and  how they're used to try to treat the symptoms and in coordination of their care.   #3  Prostate Cancer: followed by Urology

## 2014-06-04 NOTE — Telephone Encounter (Signed)
Pt informed of prescription for UTI ready for pickup

## 2014-06-04 NOTE — Addendum Note (Signed)
Addended by: Lorne Skeens D on: 06/04/2014 10:37 AM   Modules accepted: Orders

## 2014-06-08 ENCOUNTER — Ambulatory Visit: Payer: Medicare Other | Admitting: Infectious Disease

## 2014-06-22 DIAGNOSIS — H4011X3 Primary open-angle glaucoma, severe stage: Secondary | ICD-10-CM | POA: Diagnosis not present

## 2014-06-22 DIAGNOSIS — H44511 Absolute glaucoma, right eye: Secondary | ICD-10-CM | POA: Diagnosis not present

## 2014-06-22 DIAGNOSIS — H34832 Tributary (branch) retinal vein occlusion, left eye: Secondary | ICD-10-CM | POA: Diagnosis not present

## 2014-06-24 ENCOUNTER — Ambulatory Visit: Payer: Medicare Other | Admitting: Infectious Disease

## 2014-06-25 ENCOUNTER — Other Ambulatory Visit: Payer: Self-pay | Admitting: Internal Medicine

## 2014-06-29 ENCOUNTER — Other Ambulatory Visit: Payer: Self-pay | Admitting: Internal Medicine

## 2014-06-29 DIAGNOSIS — I6523 Occlusion and stenosis of bilateral carotid arteries: Secondary | ICD-10-CM

## 2014-07-02 DIAGNOSIS — H35352 Cystoid macular degeneration, left eye: Secondary | ICD-10-CM | POA: Diagnosis not present

## 2014-07-02 DIAGNOSIS — H3532 Exudative age-related macular degeneration: Secondary | ICD-10-CM | POA: Diagnosis not present

## 2014-07-06 ENCOUNTER — Other Ambulatory Visit: Payer: Self-pay | Admitting: Internal Medicine

## 2014-07-06 ENCOUNTER — Ambulatory Visit (HOSPITAL_COMMUNITY): Payer: Medicare Other | Attending: Cardiology

## 2014-07-06 DIAGNOSIS — I6523 Occlusion and stenosis of bilateral carotid arteries: Secondary | ICD-10-CM | POA: Diagnosis not present

## 2014-07-07 ENCOUNTER — Telehealth: Payer: Self-pay | Admitting: Internal Medicine

## 2014-07-07 ENCOUNTER — Other Ambulatory Visit: Payer: Self-pay | Admitting: *Deleted

## 2014-07-07 ENCOUNTER — Other Ambulatory Visit: Payer: Self-pay | Admitting: Internal Medicine

## 2014-07-07 MED ORDER — FOSINOPRIL SODIUM 20 MG PO TABS: ORAL_TABLET | ORAL | Status: DC

## 2014-07-07 MED ORDER — AMLODIPINE BESYLATE 5 MG PO TABS: ORAL_TABLET | ORAL | Status: DC

## 2014-07-07 NOTE — Telephone Encounter (Signed)
Rx's sent to pharmacy.  

## 2014-07-07 NOTE — Telephone Encounter (Signed)
Unable to forward refill request, however copied and pasted response: fosinopril (MONOPRIL) 20 MG tablet [Pharmacy Med Name: FOSINOPRIL 20MG  TABLETS] 90 tablet 0 07/07/2014      Sig:  TAKE 1 TABLET BY MOUTH DAILY    Class:  Normal    DAW:  No    Reason for Refusal:  Patient needs an appointment    Refused By:  Santiago Bumpers, CMA     amLODipine (NORVASC) 5 MG tablet [Pharmacy Med Name: AMLODIPINE BESYLATE 5MG  TABLETS] 90 tablet 0 07/07/2014      Sig:  TAKE 1 TABLET BY MOUTH DAILY(OFFICE VISIT FOR MORE REFILLS)    Class:  Normal    DAW:  No    Comment:  **Patient requests 90 days supply**    Reason for Refusal:  Patient needs an appointment    Refused By:  Santiago Bumpers, CMA      AMLODIPINE WAS REFUSED, BUT RACHEL SENT IN 30 DAY. Pt made appt for 8/28, BUT that will not be enough to get pt through Pt has NOTpicked up the 30 day.  Called pharm and told them to hold the 30, you would be calling a 90 day instead Is that OK?? [facility], [address] and [address]

## 2014-07-17 ENCOUNTER — Other Ambulatory Visit: Payer: Self-pay | Admitting: Internal Medicine

## 2014-07-27 ENCOUNTER — Encounter: Payer: Self-pay | Admitting: Infectious Disease

## 2014-07-27 ENCOUNTER — Ambulatory Visit (INDEPENDENT_AMBULATORY_CARE_PROVIDER_SITE_OTHER): Payer: Medicare Other | Admitting: Infectious Disease

## 2014-07-27 VITALS — BP 175/49 | HR 60 | Temp 97.2°F | Wt 175.8 lb

## 2014-07-27 DIAGNOSIS — I6523 Occlusion and stenosis of bilateral carotid arteries: Secondary | ICD-10-CM

## 2014-07-27 DIAGNOSIS — Z8546 Personal history of malignant neoplasm of prostate: Secondary | ICD-10-CM | POA: Diagnosis not present

## 2014-07-27 DIAGNOSIS — I10 Essential (primary) hypertension: Secondary | ICD-10-CM

## 2014-07-27 DIAGNOSIS — N183 Chronic kidney disease, stage 3 unspecified: Secondary | ICD-10-CM

## 2014-07-27 DIAGNOSIS — N390 Urinary tract infection, site not specified: Secondary | ICD-10-CM

## 2014-07-27 DIAGNOSIS — N400 Enlarged prostate without lower urinary tract symptoms: Secondary | ICD-10-CM

## 2014-07-27 DIAGNOSIS — N3289 Other specified disorders of bladder: Secondary | ICD-10-CM

## 2014-07-27 NOTE — Progress Notes (Signed)
Subjective:    Patient ID: Alexander Mcdonald, male    DOB: 12-Jun-1926, 81 y.o.   MRN: [id number]  HPI  81 year old man with hx significant for CKD, gout, prostate cancer who has had recurrent symptoms of dysuria, polyuria x 3 years. He has been followed recently by [facility] by Steele Berg and Kathie Rhodes. He previously would have resolution of his symptoms with use of antibiotic and antispasmodic but now continues to have recurrence of ssx shortly after stopping both antibiotic and antispasmodic.   He had repeatedly grown Pseudomonas Aeruginosa from his urine,  on 03/30/13 when he grew 75K R to cipro/levo, I to [place] but S to zosyn, IMI, Ceftaz and Cefepime.   He had continued to receive Cefdinir for this despite the fact that this cephalosporin has ZERO ANTI-PSEUDOMONAL activity.   IN talking to me it was not clear that he actually is suffering from genuine UTI's though he does have ssx of dysuria, polyruria and nausea occasionally with vomiting and even excessive sleeping. If these DO correspond to UTI's with Pseudomonas then he must be actually managing to survive these episodes without EFFECTIVE abx over this time period.  He has had no fevers, chills, weight loss or syncope or light headedness.      I saw him clinic for first time in March 2015 and did UA which was with 0-2 WBC. Urine culture only yielded 55k Pseudomonas Aeruginosa and he had in fact improved clinically since I saw him last week. He has had one epidsode of dysuria this am early but this has subsequently cleared.  I saw him a week later and he was doing fine and I encouraged use of antispasmodics and avoidance of antibiotics. SInce then he has been seen by PCP who has given him rx for [facility] and he has had DRAMATIC improvement in urinary spasm ssx. He had repeat UA and urine culture that was completely sterile. He has not resumed any abx. He has no systemic ssx.  At one point he had  stopped the [facility] and  shortly after stopping it began having ssx of dysuria, increasing frequency but apparently also less appetite though no fevers. He was seen by Urology where UA did not show WBC per daughter but Urine culture grew an organism. He took the fosfomycin and then had more symptoms a few weeks later and was again treated with fosfomycin. He had  minimal ssx with MYRBETRIQ when last seen.  In mid April he had NEW worsening of symptoms beyond what he typically has with frequency urgency, dysuria beyond what he typically experiences.   His daughter gave him a dose of fosfomycin with improvement of his symptoms. He also began taking "URIBEL" and symptoms resolved after taking 2 doses of fosfomycin and with the uribel. Then roughly week after stopping these he had recurrence of dysuria and a urine sample with analysis and culture was dropped off in our clinic last week. Urinalysis did show copious white blood cells and culture eventually grew pseudomonas aeruginosa resistant to levofloxacin and ciprofloxacin but sensitive to other antipseudomonal agents. He had started back on Uribel and I also in the interim (not knowing he had taken fosfomycin already and (recommended him start fosfomycin again. Today presents clinic for follow-up and states he feels "better than average at". According the daughter he is improved today versus yesterday. We discussed very as things that we could do and I would recommend for now continue on with fosfomycin for an additional  2 doses along with the uribel.   He certainly did not seem systemically ill  And I ended up lettin him complete another course of fosfomycin. I last saw him in May of 2016 and he now states that over a week ago he felt some dysuria begin, the 'beginnings of UTI" but these then dissappeared without treatment  Review of Systems  Constitutional: Negative for fever, diaphoresis, activity change, appetite change and unexpected weight change.  HENT: Negative for  congestion.   Eyes: Negative for photophobia.  Respiratory: Negative for cough, chest tightness, wheezing and stridor.   Cardiovascular: Negative for chest pain.  Gastrointestinal: Negative for abdominal pain, diarrhea, constipation and abdominal distention.  Genitourinary: Positive for difficulty urinating. Negative for dysuria.  Musculoskeletal: Negative for back pain and arthralgias.  Skin: Negative for color change and pallor.  Neurological: Negative for tremors.  Psychiatric/Behavioral: Negative for sleep disturbance and dysphoric mood.       Objective:   Physical Exam  Constitutional: No distress.  HENT:  Head: Normocephalic and atraumatic.  Mouth/Throat: Oropharynx is clear and moist. No oropharyngeal exudate.  Eyes: Conjunctivae and EOM are normal. No scleral icterus.  Neck: Normal range of motion. Neck supple. No JVD present.  Cardiovascular: Normal rate, regular rhythm and normal heart sounds.  Exam reveals no gallop and no friction rub.   No murmur heard. Pulmonary/Chest: Effort normal and breath sounds normal. No respiratory distress. He has no wheezes. He has no rales. He exhibits no tenderness.  Abdominal: He exhibits no distension and no mass. There is no tenderness. There is no rebound and no guarding.  Musculoskeletal: He exhibits no edema or tenderness.  Lymphadenopathy:    He has no cervical adenopathy.  Neurological: He is alert. He exhibits normal muscle tone. Coordination normal.  Skin: Skin is warm and dry. He is not diaphoretic. No erythema. No pallor.  Psychiatric: He has a normal mood and affect. His behavior is normal. Judgment and thought content normal.  Nursing note and vitals reviewed.         Assessment & Plan:   #1 Chronic cystitis:  --continue his antispasmodics --continue to AVOID unnecessary antibiotics and avoid over-treating him with abx, the longer without abx the BETTER  #2 Recurrent UTI: See above  Have been using fosfomycin  recently episodically but he will inevitably develop R to this as well esp if used repeatedly    #3  Prostate Cancer: followed by Urology

## 2014-08-18 ENCOUNTER — Other Ambulatory Visit: Payer: Self-pay | Admitting: Internal Medicine

## 2014-08-27 DIAGNOSIS — H35352 Cystoid macular degeneration, left eye: Secondary | ICD-10-CM | POA: Diagnosis not present

## 2014-08-27 DIAGNOSIS — H4011X3 Primary open-angle glaucoma, severe stage: Secondary | ICD-10-CM | POA: Diagnosis not present

## 2014-08-27 DIAGNOSIS — H44511 Absolute glaucoma, right eye: Secondary | ICD-10-CM | POA: Diagnosis not present

## 2014-08-27 DIAGNOSIS — H34832 Tributary (branch) retinal vein occlusion, left eye: Secondary | ICD-10-CM | POA: Diagnosis not present

## 2014-08-27 DIAGNOSIS — H3532 Exudative age-related macular degeneration: Secondary | ICD-10-CM | POA: Diagnosis not present

## 2014-09-15 ENCOUNTER — Ambulatory Visit (INDEPENDENT_AMBULATORY_CARE_PROVIDER_SITE_OTHER): Payer: Medicare Other | Admitting: Internal Medicine

## 2014-09-15 ENCOUNTER — Other Ambulatory Visit: Payer: Self-pay | Admitting: *Deleted

## 2014-09-15 ENCOUNTER — Encounter: Payer: Self-pay | Admitting: Internal Medicine

## 2014-09-15 VITALS — BP 130/70 | HR 59 | Temp 97.7°F | Resp 18 | Ht 64.5 in | Wt 175.0 lb

## 2014-09-15 DIAGNOSIS — Z0000 Encounter for general adult medical examination without abnormal findings: Secondary | ICD-10-CM | POA: Diagnosis not present

## 2014-09-15 DIAGNOSIS — B351 Tinea unguium: Secondary | ICD-10-CM | POA: Diagnosis not present

## 2014-09-15 DIAGNOSIS — E7800 Pure hypercholesterolemia, unspecified: Secondary | ICD-10-CM

## 2014-09-15 DIAGNOSIS — Z8546 Personal history of malignant neoplasm of prostate: Secondary | ICD-10-CM | POA: Diagnosis not present

## 2014-09-15 DIAGNOSIS — I10 Essential (primary) hypertension: Secondary | ICD-10-CM | POA: Diagnosis not present

## 2014-09-15 LAB — LIPID PANEL
Cholesterol: 116 mg/dL (ref 0–200)
HDL: 39.8 mg/dL (ref >39.00)
LDL Cholesterol: 38 mg/dL (ref 0–99)
NonHDL: 76.15
Total CHOL/HDL Ratio: 3
Triglycerides: 189 mg/dL — ABNORMAL HIGH (ref 0.0–149.0)
VLDL: 37.8 mg/dL (ref 0.0–40.0)

## 2014-09-15 LAB — CBC WITH DIFFERENTIAL/PLATELET
Basophils Absolute: 0 10*3/uL (ref 0.0–0.1)
Basophils Relative: 0.5 % (ref 0.0–3.0)
Eosinophils Absolute: 0.2 10*3/uL (ref 0.0–0.7)
Eosinophils Relative: 2.8 % (ref 0.0–5.0)
HCT: 42.9 % (ref 39.0–52.0)
Hemoglobin: 14.7 g/dL (ref 13.0–17.0)
Lymphocytes Relative: 21.1 % (ref 12.0–46.0)
Lymphs Abs: 1.6 10*3/uL (ref 0.7–4.0)
MCHC: 34.2 g/dL (ref 30.0–36.0)
MCV: 90.7 fl (ref 78.0–100.0)
Monocytes Absolute: 0.7 10*3/uL (ref 0.1–1.0)
Monocytes Relative: 9 % (ref 3.0–12.0)
Neutro Abs: 5.1 10*3/uL (ref 1.4–7.7)
Neutrophils Relative %: 66.6 % (ref 43.0–77.0)
Platelets: 173 10*3/uL (ref 150.0–400.0)
RBC: 4.74 Mil/uL (ref 4.22–5.81)
RDW: 14.1 % (ref 11.5–15.5)
WBC: 7.6 10*3/uL (ref 4.0–10.5)

## 2014-09-15 LAB — COMPREHENSIVE METABOLIC PANEL
ALT: 19 U/L (ref 0–53)
AST: 17 U/L (ref 0–37)
Albumin: 4.4 g/dL (ref 3.5–5.2)
Alkaline Phosphatase: 72 U/L (ref 39–117)
BUN: 25 mg/dL — ABNORMAL HIGH (ref 6–23)
CO2: 26 mEq/L (ref 19–32)
Calcium: 9.4 mg/dL (ref 8.4–10.5)
Chloride: 108 mEq/L (ref 96–112)
Creatinine, Ser: 2.11 mg/dL — ABNORMAL HIGH (ref 0.40–1.50)
GFR: 31.79 mL/min — ABNORMAL LOW (ref >60.00)
Glucose, Bld: 93 mg/dL (ref 70–99)
Potassium: 4.9 mEq/L (ref 3.5–5.1)
Sodium: 141 mEq/L (ref 135–145)
Total Bilirubin: 0.8 mg/dL (ref 0.2–1.2)
Total Protein: 7.3 g/dL (ref 6.0–8.3)

## 2014-09-15 LAB — TSH: TSH: 5.99 u[IU]/mL — ABNORMAL HIGH (ref 0.35–4.50)

## 2014-09-15 MED ORDER — AMLODIPINE BESYLATE 5 MG PO TABS: ORAL_TABLET | ORAL | Status: DC

## 2014-09-15 NOTE — Progress Notes (Signed)
Subjective:    Patient ID: Alexander Mcdonald, male    DOB: [birth year], 81 y.o.   MRN: [id number]  HPI   Pre visit review using our clinic review tool, if applicable. No additional management support is needed unless otherwise documented below in the visit note.  81 year old patient who is seen today for a wellness exam.   He is followed closely by urology and has also been evaluated by ID  due to chronic UTIs.  At the present time, he has done quite well.  In general, doing quite well.  For the past few months he has been living with his daughter  BP Readings from Last 3 Encounters:  09/15/14 130/70  07/27/14 175/49  06/04/14 160/76    Wt Readings from Last 3 Encounters:  09/15/14 175 lb (79.379 kg)  07/27/14 175 lb 12 oz (79.72 kg)  06/04/14 174 lb (78.926 kg)    Here for Medicare AWV:   1. Risk factors based on Past M, S, F history: cardiovascular risk factors include hypertension, and hypercholesterolemia  2. Physical Activities: remains very active physically, although limited by partial blindness  3. Depression/mood: history of depression, which has been stable. No current treatment  4. Hearing: no deficits  5. ADL's: independent in all aspects of daily living  6. Fall Risk: moderate due to visual deficits  7. Home Safety: no problems identified  8. Height, weight, &visual acuity:height and weight stable. Light perception only involving the right eye  9. Counseling: heart healthy diet regular. Exercise encouraged  10. Labs ordered based on risk factors: laboratory profile, including lipid panel be reviewed  11. Referral Coordination- will follow-up with ophthalmology and urology  12. Care Plan- heart healthy diet regular exercise. All encouraged modest weight loss encouraged  13. Cognitive Assessment- alert and oriented normal affect. No history of memory dysfunction, handles all executive functioning  14.  Preventive services will include annual clinical exams with  screening lab.  Annual ophthalmology evaluations.  Encouraged. 15.  Provider list includes primary care and ophthalmology, infectious disease and urology Allergies (verified):  No Known Drug Allergies   Past History:  Past Medical History:   Prostate cancer, hx of  Depression  Gout  Hypertension  glaucoma  Renal insufficiency  history of acute renal failure  Carotid artery disease  Past Surgical History:   Carotid endarterectomy  diagnosed with prostate cancer in 2002. He was Gleason 6 involve the right middle lobe  Cataract extraction right eye 2009  colonoscopy June 2010  carotid artery Doppler ultrasound, April 2010, 5-11 5-12, 6-13, 6-15,  6-16  Family History:   Fam hx Leukemia  Family History Hypertension  both parents died at 8 brother may have had coronary artery disease. Sister died of leukemia  two brothers, 3 sisters   Social History:   Widower; son in [place].  Daughter lives with patient Former Smoker  Alcohol use-yes     Review of Systems  Constitutional: Negative for fever, chills, activity change, appetite change and fatigue.  HENT: Negative for congestion, dental problem, ear pain, hearing loss, mouth sores, rhinorrhea, sinus pressure, sneezing, tinnitus, trouble swallowing and voice change.   Eyes: Negative for photophobia, pain, redness and visual disturbance.  Respiratory: Negative for apnea, cough, choking, chest tightness, shortness of breath and wheezing.   Cardiovascular: Negative for chest pain, palpitations and leg swelling.  Gastrointestinal: Negative for nausea, vomiting, abdominal pain, diarrhea, constipation, blood in stool, abdominal distention, anal bleeding and rectal pain.  Genitourinary: Negative for dysuria,  urgency, frequency, hematuria, flank pain, decreased urine volume, discharge, penile swelling, scrotal swelling, difficulty urinating, genital sores and testicular pain.  Musculoskeletal: Negative for myalgias, back pain, joint  swelling, arthralgias, gait problem, neck pain and neck stiffness.  Skin: Negative for color change, rash and wound.  Neurological: Positive for light-headedness. Negative for dizziness, tremors, seizures, syncope, facial asymmetry, speech difficulty, weakness, numbness and headaches.  Hematological: Negative for adenopathy. Does not bruise/bleed easily.  Psychiatric/Behavioral: Negative for suicidal ideas, hallucinations, behavioral problems, confusion, sleep disturbance, self-injury, dysphoric mood, decreased concentration and agitation. The patient is not nervous/anxious.        Objective:   Physical Exam  Constitutional: He appears well-developed and well-nourished.  Blood pressure 160/60 in both arms  HENT:  Head: Normocephalic and atraumatic.  Right Ear: External ear normal.  Left Ear: External ear normal.  Nose: Nose normal.  Mouth/Throat: Oropharynx is clear and moist.  Dentures in place  Eyes: Conjunctivae and EOM are normal. Pupils are equal, round, and reactive to light. No scleral icterus.  Neck: Normal range of motion. Neck supple. No JVD present. No thyromegaly present.  Left carotid endarterectomy scar Bilateral soft bruits versus transmitted murmur in the carotid and supraclavicular areas  Cardiovascular: Regular rhythm.  Exam reveals no gallop and no friction rub.   Murmur heard. Grade 3 over 6 systolic murmur heard diffusely Bilateral femoral bruits Posterior tibial pulses plus 1.  Dorsalis pedis pulses not easily palpable  Pulmonary/Chest: Effort normal and breath sounds normal. He exhibits no tenderness.  Few bibasilar crackles  Abdominal: Soft. Bowel sounds are normal. He exhibits no distension and no mass. There is no tenderness.  Ventral hernia  Genitourinary: Penis normal.  Uncircumcised  Musculoskeletal: Normal range of motion. He exhibits edema. He exhibits no tenderness.  Chronic swelling, right lower leg and ankle  Lymphadenopathy:    He has no  cervical adenopathy.  Neurological: He is alert. He has normal reflexes. No cranial nerve deficit. Coordination normal.  Skin: Skin is warm and dry. No rash noted.  Skin the feet very dry with onychomycotic nail changes  Psychiatric: He has a normal mood and affect. His behavior is normal.          Assessment & Plan:   Preventive health examination Carotid artery disease.  Stable Hypertension well controlled.  Systolic readings.  A bit high, but patient does have some mild orthostatic dizziness Dyslipidemia.  Continue statin therapy  History prostate cancer.  Followup urology  Recheck 12  months

## 2014-09-15 NOTE — Progress Notes (Signed)
Pre visit review using our clinic review tool, if applicable. No additional management support is needed unless otherwise documented below in the visit note. 

## 2014-09-15 NOTE — Patient Instructions (Signed)
Limit your sodium (Salt) intake  Please check your blood pressure on a regular basis.  If it is consistently greater than 150/90, please make an office appointment.    It is important that you exercise regularly, at least 20 minutes 3 to 4 times per week.  If you develop chest pain or shortness of breath seek  medical attention.  Return in one year for follow-up  

## 2014-10-16 ENCOUNTER — Ambulatory Visit (INDEPENDENT_AMBULATORY_CARE_PROVIDER_SITE_OTHER): Payer: Medicare Other | Admitting: Podiatry

## 2014-10-16 ENCOUNTER — Encounter: Payer: Self-pay | Admitting: Podiatry

## 2014-10-16 ENCOUNTER — Telehealth: Payer: Self-pay | Admitting: Internal Medicine

## 2014-10-16 VITALS — BP 153/74 | HR 60 | Resp 12

## 2014-10-16 DIAGNOSIS — M79676 Pain in unspecified toe(s): Secondary | ICD-10-CM | POA: Diagnosis not present

## 2014-10-16 DIAGNOSIS — I10 Essential (primary) hypertension: Secondary | ICD-10-CM

## 2014-10-16 DIAGNOSIS — E7800 Pure hypercholesterolemia, unspecified: Secondary | ICD-10-CM

## 2014-10-16 DIAGNOSIS — I6523 Occlusion and stenosis of bilateral carotid arteries: Secondary | ICD-10-CM | POA: Diagnosis not present

## 2014-10-16 DIAGNOSIS — C61 Malignant neoplasm of prostate: Secondary | ICD-10-CM | POA: Diagnosis not present

## 2014-10-16 DIAGNOSIS — R7989 Other specified abnormal findings of blood chemistry: Secondary | ICD-10-CM

## 2014-10-16 DIAGNOSIS — B351 Tinea unguium: Secondary | ICD-10-CM | POA: Diagnosis not present

## 2014-10-16 NOTE — Telephone Encounter (Signed)
Spoke to Sidney pt's daughter, calling about lab results, saw Thyroid was high wanted to know if Dr.K wanted to do anything. Also pt's bilateral legs and ankles are swollen and he is elevating them. Told [name] continue elevation of legs and monitor blood pressure and swelling if continues make an appt to see Dr.K. [name] verbalized understanding.

## 2014-10-16 NOTE — Progress Notes (Signed)
   Subjective:    Patient ID: Alexander Mcdonald, male    DOB: [birth year], 81 y.o.   MRN: [id number]  HPI 81 year-old male presents the office with complaints of thick, discolored, elongated toenails which she is unable to trim himself. Denies any surrounding redness or drainage. He presents today with his daughter. No other complaints at this time.  Review of Systems  Constitutional: Positive for fatigue.  HENT: Positive for sneezing.   Genitourinary: Positive for frequency.       Objective:   Physical Exam AAO 3, NAD DP/PT pulses palpable 1/4, CRT less than 3 seconds  There appears to be chronic lower extremity edema. Protective sensation appears to be intact with Simms once the monofilament Nails are hypertrophic, dystrophic, brittle, discolored, elongated 10. There is tenderness to palpation overlying nails 1-5 bilaterally. There is no surrounding erythema or drainage. No open lesions or pre-ulcerative lesions. No other areas of tenderness to bilateral lower extremities. There is no overlying erythema or increase in warmth to bilateral feet. No pain with calf compression, swelling, warmth, erythema.     Assessment & Plan:  81 year old male with symptomatic onychomycosis -Treatment options discussed including all alternatives, risks, and complications -Nail sharply debrided 10 without complications -Discussed the importance of daily foot inspection. If there is any problems to call the office immediately or if there is any change -Follow-up in 3 months or sooner if any problems arise. In the meantime, encouraged to call the office with any questions, concerns, change in symptoms. Follow-up with PCP for other issues mentioned and review of systems.  Celesta Gentile, DPM

## 2014-10-16 NOTE — Telephone Encounter (Signed)
Pt's daughter called in stating fathers legs have swelling and this is a new symptom.. Also wants to discuss test results from prior visit

## 2014-10-19 NOTE — Telephone Encounter (Signed)
Dr. Raliegh Ip, please review labs from last visit daughter concerned about elevated Thyroid and leg swelling.

## 2014-10-19 NOTE — Telephone Encounter (Signed)
Please call pt's daughter [name] at [phone] and schedule lab appt only per Dr.K going to repeat labs. Orders are in EPIC.

## 2014-10-19 NOTE — Telephone Encounter (Signed)
Please schedule a repeat TSH and Bmet this week or next

## 2014-10-20 NOTE — Telephone Encounter (Signed)
Pt has been sch for 10-22-14

## 2014-10-22 ENCOUNTER — Other Ambulatory Visit: Payer: Medicare Other

## 2014-10-23 ENCOUNTER — Other Ambulatory Visit (INDEPENDENT_AMBULATORY_CARE_PROVIDER_SITE_OTHER): Payer: Medicare Other

## 2014-10-23 DIAGNOSIS — E7800 Pure hypercholesterolemia, unspecified: Secondary | ICD-10-CM

## 2014-10-23 DIAGNOSIS — R7989 Other specified abnormal findings of blood chemistry: Secondary | ICD-10-CM

## 2014-10-23 DIAGNOSIS — E785 Hyperlipidemia, unspecified: Secondary | ICD-10-CM | POA: Diagnosis not present

## 2014-10-23 DIAGNOSIS — I10 Essential (primary) hypertension: Secondary | ICD-10-CM | POA: Diagnosis not present

## 2014-10-23 LAB — BASIC METABOLIC PANEL
BUN: 37 mg/dL — ABNORMAL HIGH (ref 6–23)
CO2: 26 mEq/L (ref 19–32)
Calcium: 9.3 mg/dL (ref 8.4–10.5)
Chloride: 105 mEq/L (ref 96–112)
Creatinine, Ser: 2.28 mg/dL — ABNORMAL HIGH (ref 0.40–1.50)
GFR: 29.07 mL/min — ABNORMAL LOW (ref >60.00)
Glucose, Bld: 138 mg/dL — ABNORMAL HIGH (ref 70–99)
Potassium: 4.8 mEq/L (ref 3.5–5.1)
Sodium: 140 mEq/L (ref 135–145)

## 2014-10-23 LAB — TSH: TSH: 4.06 u[IU]/mL (ref 0.35–4.50)

## 2014-10-27 ENCOUNTER — Ambulatory Visit (HOSPITAL_COMMUNITY)
Admission: RE | Admit: 2014-10-27 | Discharge: 2014-10-27 | Disposition: A | Discharge status: Home / Self Care | Payer: Medicare Other | Source: Ambulatory Visit | Attending: Infectious Disease | Admitting: Infectious Disease

## 2014-10-27 ENCOUNTER — Encounter: Payer: Self-pay | Admitting: Infectious Disease

## 2014-10-27 ENCOUNTER — Ambulatory Visit (INDEPENDENT_AMBULATORY_CARE_PROVIDER_SITE_OTHER): Payer: Medicare Other | Admitting: Infectious Disease

## 2014-10-27 VITALS — BP 165/69 | HR 60 | Temp 97.6°F | Ht 66.0 in | Wt 175.0 lb

## 2014-10-27 DIAGNOSIS — Z8546 Personal history of malignant neoplasm of prostate: Secondary | ICD-10-CM | POA: Diagnosis not present

## 2014-10-27 DIAGNOSIS — N390 Urinary tract infection, site not specified: Secondary | ICD-10-CM | POA: Diagnosis not present

## 2014-10-27 DIAGNOSIS — N3020 Other chronic cystitis without hematuria: Secondary | ICD-10-CM

## 2014-10-27 DIAGNOSIS — I6523 Occlusion and stenosis of bilateral carotid arteries: Secondary | ICD-10-CM | POA: Diagnosis not present

## 2014-10-27 DIAGNOSIS — R600 Localized edema: Secondary | ICD-10-CM | POA: Diagnosis not present

## 2014-10-27 DIAGNOSIS — Z23 Encounter for immunization: Secondary | ICD-10-CM | POA: Diagnosis not present

## 2014-10-27 DIAGNOSIS — N3289 Other specified disorders of bladder: Secondary | ICD-10-CM | POA: Diagnosis not present

## 2014-10-27 DIAGNOSIS — M7989 Other specified soft tissue disorders: Secondary | ICD-10-CM | POA: Diagnosis not present

## 2014-10-27 NOTE — Progress Notes (Signed)
VASCULAR LAB PRELIMINARY  PRELIMINARY  PRELIMINARY  PRELIMINARY  Bilateral lower extremity venous duplex  completed.    Preliminary report:  Bilateral:  No evidence of DVT, superficial thrombosis, or Baker's Cyst.    Darrick Greenlaw, RVT 10/27/2014, 4:35 PM

## 2014-10-27 NOTE — Progress Notes (Signed)
Chief complaint: R>>Left lower extremity edema  Subjective:    Patient ID: Alexander Mcdonald, male    DOB: 03/21/1926, 81 y.o.   MRN: [id number]  HPI   81 year old man with hx significant for CKD, gout, prostate cancer who has had recurrent symptoms of dysuria, polyuria x 3 years. He has been followed recently by [facility] by Steele Berg and Kathie Rhodes. He previously would have resolution of his symptoms with use of antibiotic and antispasmodic but now continues to have recurrence of ssx shortly after stopping both antibiotic and antispasmodic.   He had repeatedly grown Pseudomonas Aeruginosa from his urine,  on 03/30/13 when he grew 75K R to cipro/levo, I to [place] but S to zosyn, IMI, Ceftaz and Cefepime.   He had continued to receive Cefdinir for this despite the fact that this cephalosporin has ZERO ANTI-PSEUDOMONAL activity.   IN talking to me it was not clear that he actually is suffering from genuine UTI's though he does have ssx of dysuria, polyruria and nausea occasionally with vomiting and even excessive sleeping. If these DO correspond to UTI's with Pseudomonas then he must be actually managing to survive these episodes without EFFECTIVE abx over this time period.  He has had no fevers, chills, weight loss or syncope or light headedness.      I saw him clinic for first time in March 2015 and did UA which was with 0-2 WBC. Urine culture only yielded 55k Pseudomonas Aeruginosa and he had in fact improved clinically since I saw him last week. He has had one epidsode of dysuria this am early but this has subsequently cleared.  I saw him a week later and he was doing fine and I encouraged use of antispasmodics and avoidance of antibiotics. SInce then he has been seen by PCP who has given him rx for [facility] and he has had DRAMATIC improvement in urinary spasm ssx. He had repeat UA and urine culture that was completely sterile. He has not resumed any abx. He has no systemic ssx.  At  one point he had  stopped the [facility] and shortly after stopping it began having ssx of dysuria, increasing frequency but apparently also less appetite though no fevers. He was seen by Urology where UA did not show WBC per daughter but Urine culture grew an organism. He took the fosfomycin and then had more symptoms a few weeks later and was again treated with fosfomycin. He had  minimal ssx with MYRBETRIQ when last seen.  In mid April he had NEW worsening of symptoms beyond what he typically has with frequency urgency, dysuria beyond what he typically experiences.   His daughter gave him a dose of fosfomycin with improvement of his symptoms. He also began taking "URIBEL" and symptoms resolved after taking 2 doses of fosfomycin and with the uribel. Then roughly week after stopping these he had recurrence of dysuria and a urine sample with analysis and culture was dropped off in our clinic last week. Urinalysis did show copious white blood cells and culture eventually grew pseudomonas aeruginosa resistant to levofloxacin and ciprofloxacin but sensitive to other antipseudomonal agents. He had started back on Uribel and I also in the interim (not knowing he had taken fosfomycin already and (recommended him start fosfomycin again. Today presents clinic for follow-up and states he feels "better than average at".   I last saw him in July of this year (2016)  We have continued to be able to manage avoiding antibiotics.  He apparently had some symptoms a few weeks ago and they were concerned that he had "UTI" coming on but by increasing his fluid intake and use of Mirabel.  He also saw Dr. Jacqualyn Posey and there was concern for R > L, lower extremity edema.   Review of Systems  Constitutional: Negative for fever, diaphoresis, activity change, appetite change and unexpected weight change.  HENT: Negative for congestion.   Eyes: Negative for photophobia.  Respiratory: Negative for cough, chest tightness,  wheezing and stridor.   Cardiovascular: Positive for leg swelling. Negative for chest pain.  Gastrointestinal: Negative for abdominal pain, diarrhea, constipation and abdominal distention.  Genitourinary: Negative for dysuria.  Musculoskeletal: Negative for back pain and arthralgias.  Skin: Negative for color change and pallor.  Neurological: Negative for tremors.  Psychiatric/Behavioral: Negative for sleep disturbance and dysphoric mood.       Objective:   Physical Exam  Constitutional: No distress.  HENT:  Head: Normocephalic and atraumatic.  Mouth/Throat: Oropharynx is clear and moist. No oropharyngeal exudate.  Eyes: Conjunctivae and EOM are normal. No scleral icterus.  Neck: Normal range of motion. Neck supple. No JVD present.  Cardiovascular: Normal rate, regular rhythm and normal heart sounds.  Exam reveals no gallop and no friction rub.   No murmur heard. Pulmonary/Chest: Effort normal and breath sounds normal. No respiratory distress. He has no wheezes. He has no rales. He exhibits no tenderness.  Abdominal: He exhibits no distension and no mass. There is no tenderness. There is no rebound and no guarding.  Musculoskeletal: He exhibits edema. He exhibits no tenderness.  R>> left pitting edema  Lymphadenopathy:    He has no cervical adenopathy.  Neurological: He is alert. He exhibits normal muscle tone. Coordination normal.  Skin: Skin is warm and dry. He is not diaphoretic. No erythema. No pallor.  Psychiatric: He has a normal mood and affect. His behavior is normal. Judgment and thought content normal.  Nursing note and vitals reviewed.         Assessment & Plan:   #1 New R >> Left edema:   We were able to get Vascular study that excluded DVT  Will otherwise defer to Primary  #2 Chronic cystitis:  --continue his antispasmodics --continue to AVOID unnecessary antibiotics and avoid over-treating him with abx, the longer without abx the BETTER  #3 Recurrent  UTI: See above  Have been using fosfomycin recently episodically but he will inevitably develop R to this as well esp if used repeatedly   #4  Prostate Cancer: followed by Urology   I spent greater than 40 minutes with the patient including greater than 50% of time in face to face counsel of the patient and his daughter re his new LE edema, his chronic cystis, and recurrent UTI's and in coordination of his care.

## 2014-11-02 ENCOUNTER — Telehealth: Payer: Self-pay | Admitting: Internal Medicine

## 2014-11-02 NOTE — Telephone Encounter (Signed)
Spoke to pt's daughter [name], told her thyroid test was normal, other blood work normal except very slight worsening of Kidney studies. Needs to stop Monopril. [name] verbalized understanding. Asked [name] if pt it staking any anti-inflammatory medications such as Advil, Aleve or Ibuprofen. [name] said no. Told her okay Dr.K wants him to follow up in 3 months for office visit and will repeat labs at that time. [name] verbalized understanding.

## 2014-11-02 NOTE — Telephone Encounter (Signed)
Pt daughter would like blood work results

## 2014-11-02 NOTE — Telephone Encounter (Signed)
Please advise 

## 2014-11-02 NOTE — Telephone Encounter (Signed)
Notify daughter that the thyroid test was normal On the blood work normal except for very slight worsening of kidney studies  Discontinue Monopril  Please confirm.  The patient takes no anti-inflammatory medications such as Advil, Aleve or ibuprofen  Return office visit with lab in 3 months

## 2014-11-05 DIAGNOSIS — H353221 Exudative age-related macular degeneration, left eye, with active choroidal neovascularization: Secondary | ICD-10-CM | POA: Diagnosis not present

## 2014-11-05 DIAGNOSIS — H401133 Primary open-angle glaucoma, bilateral, severe stage: Secondary | ICD-10-CM | POA: Diagnosis not present

## 2014-11-05 DIAGNOSIS — H44511 Absolute glaucoma, right eye: Secondary | ICD-10-CM | POA: Diagnosis not present

## 2014-11-05 DIAGNOSIS — H35352 Cystoid macular degeneration, left eye: Secondary | ICD-10-CM | POA: Diagnosis not present

## 2014-11-05 DIAGNOSIS — H348322 Tributary (branch) retinal vein occlusion, left eye, stable: Secondary | ICD-10-CM | POA: Diagnosis not present

## 2015-01-12 DIAGNOSIS — H44511 Absolute glaucoma, right eye: Secondary | ICD-10-CM | POA: Diagnosis not present

## 2015-01-12 DIAGNOSIS — H348322 Tributary (branch) retinal vein occlusion, left eye, stable: Secondary | ICD-10-CM | POA: Diagnosis not present

## 2015-01-12 DIAGNOSIS — H3581 Retinal edema: Secondary | ICD-10-CM | POA: Diagnosis not present

## 2015-01-12 DIAGNOSIS — H353221 Exudative age-related macular degeneration, left eye, with active choroidal neovascularization: Secondary | ICD-10-CM | POA: Insufficient documentation

## 2015-01-12 DIAGNOSIS — H401133 Primary open-angle glaucoma, bilateral, severe stage: Secondary | ICD-10-CM | POA: Diagnosis not present

## 2015-01-15 ENCOUNTER — Encounter: Payer: Self-pay | Admitting: Podiatry

## 2015-01-15 ENCOUNTER — Ambulatory Visit (INDEPENDENT_AMBULATORY_CARE_PROVIDER_SITE_OTHER): Payer: Medicare Other | Admitting: Podiatry

## 2015-01-15 DIAGNOSIS — M79676 Pain in unspecified toe(s): Secondary | ICD-10-CM

## 2015-01-15 DIAGNOSIS — B351 Tinea unguium: Secondary | ICD-10-CM

## 2015-01-18 NOTE — Progress Notes (Signed)
Patient ID: Alexander Mcdonald, male   DOB: 1926-01-30, 82 y.o.   MRN: [id number]  Subjective: 82 y.o. returns the office today for painful, elongated, thickened toenails which he cannot trim himself. Denies any redness or drainage around the nails. Denies any acute changes since last appointment and no new complaints today. Denies any systemic complaints such as fevers, chills, nausea, vomiting.   Objective: AAO 3, NAD DP/PT pulses palpable 1/4 Chronic lower extremity edema present. Nails hypertrophic, dystrophic, elongated, brittle, discolored 10. There is tenderness overlying the nails 1-5 bilaterally. There is no surrounding erythema or drainage along the nail sites. No open lesions or pre-ulcerative lesions are identified. No other areas of tenderness bilateral lower extremities. No overlying edema, erythema, increased warmth. No pain with calf compression, swelling, warmth, erythema.  Assessment: Patient presents with symptomatic onychomycosis  Plan: -Treatment options including alternatives, risks, complications were discussed -Nails sharply debrided 10 without complication/bleeding. -Discussed daily foot inspection. If there are any changes, to call the office immediately.  -Follow-up in 3 months or sooner if any problems are to arise. In the meantime, encouraged to call the office with any questions, concerns, changes symptoms.  Celesta Gentile, DPM

## 2015-02-09 DIAGNOSIS — H353221 Exudative age-related macular degeneration, left eye, with active choroidal neovascularization: Secondary | ICD-10-CM | POA: Diagnosis not present

## 2015-02-22 ENCOUNTER — Ambulatory Visit: Payer: Medicare Other | Admitting: Infectious Disease

## 2015-03-01 ENCOUNTER — Ambulatory Visit: Payer: Medicare Other | Admitting: Infectious Disease

## 2015-03-16 DIAGNOSIS — H348322 Tributary (branch) retinal vein occlusion, left eye, stable: Secondary | ICD-10-CM | POA: Diagnosis not present

## 2015-03-16 DIAGNOSIS — H401133 Primary open-angle glaucoma, bilateral, severe stage: Secondary | ICD-10-CM | POA: Diagnosis not present

## 2015-03-16 DIAGNOSIS — H353221 Exudative age-related macular degeneration, left eye, with active choroidal neovascularization: Secondary | ICD-10-CM | POA: Diagnosis not present

## 2015-03-16 DIAGNOSIS — H44511 Absolute glaucoma, right eye: Secondary | ICD-10-CM | POA: Diagnosis not present

## 2015-04-02 ENCOUNTER — Encounter: Payer: Self-pay | Admitting: Gastroenterology

## 2015-04-05 DIAGNOSIS — H44511 Absolute glaucoma, right eye: Secondary | ICD-10-CM | POA: Diagnosis not present

## 2015-04-05 DIAGNOSIS — H401133 Primary open-angle glaucoma, bilateral, severe stage: Secondary | ICD-10-CM | POA: Diagnosis not present

## 2015-04-08 ENCOUNTER — Ambulatory Visit: Payer: Medicare Other | Admitting: Infectious Disease

## 2015-04-16 ENCOUNTER — Ambulatory Visit (INDEPENDENT_AMBULATORY_CARE_PROVIDER_SITE_OTHER): Payer: Medicare Other | Admitting: Podiatry

## 2015-04-16 ENCOUNTER — Encounter: Payer: Self-pay | Admitting: Podiatry

## 2015-04-16 DIAGNOSIS — M79676 Pain in unspecified toe(s): Secondary | ICD-10-CM | POA: Diagnosis not present

## 2015-04-16 DIAGNOSIS — B351 Tinea unguium: Secondary | ICD-10-CM

## 2015-04-16 NOTE — Progress Notes (Signed)
Patient ID: Alexander Mcdonald, male   DOB: 01-09-1927, 82 y.o.   MRN: [id number]  Subjective: 82 y.o. returns the office today for painful, elongated, thickened toenails which he cannot trim himself. Denies any redness or drainage around the nails. Denies any acute changes since last appointment and no new complaints today. Denies any systemic complaints such as fevers, chills, nausea, vomiting.   Objective: AAO 3, NAD DP/PT pulses palpable 1/4 Chronic lower extremity edema present. Nails hypertrophic, dystrophic, elongated, brittle, discolored 10. There is tenderness overlying the nails 1-5 bilaterally. There is no surrounding erythema or drainage along the nail sites. No open lesions or pre-ulcerative lesions are identified. No other areas of tenderness bilateral lower extremities. No overlying edema, erythema, increased warmth. No pain with calf compression, swelling, warmth, erythema.  Assessment: Patient presents with symptomatic onychomycosis  Plan: -Treatment options including alternatives, risks, complications were discussed -Nails sharply debrided 10 without complication/bleeding. -Discussed daily foot inspection. If there are any changes, to call the office immediately.  -Follow-up in 3 months or sooner if any problems are to arise. In the meantime, encouraged to call the office with any questions, concerns, changes symptoms.  Celesta Gentile, DPM

## 2015-04-22 DIAGNOSIS — H353221 Exudative age-related macular degeneration, left eye, with active choroidal neovascularization: Secondary | ICD-10-CM | POA: Diagnosis not present

## 2015-04-22 DIAGNOSIS — Z961 Presence of intraocular lens: Secondary | ICD-10-CM | POA: Diagnosis not present

## 2015-04-22 DIAGNOSIS — H44511 Absolute glaucoma, right eye: Secondary | ICD-10-CM | POA: Diagnosis not present

## 2015-04-22 DIAGNOSIS — H401133 Primary open-angle glaucoma, bilateral, severe stage: Secondary | ICD-10-CM | POA: Diagnosis not present

## 2015-05-05 ENCOUNTER — Ambulatory Visit: Payer: Medicare Other | Admitting: Infectious Disease

## 2015-06-03 DIAGNOSIS — H353221 Exudative age-related macular degeneration, left eye, with active choroidal neovascularization: Secondary | ICD-10-CM | POA: Diagnosis not present

## 2015-06-28 DIAGNOSIS — H401133 Primary open-angle glaucoma, bilateral, severe stage: Secondary | ICD-10-CM | POA: Diagnosis not present

## 2015-06-28 DIAGNOSIS — H44511 Absolute glaucoma, right eye: Secondary | ICD-10-CM | POA: Diagnosis not present

## 2015-07-01 ENCOUNTER — Other Ambulatory Visit: Payer: Self-pay | Admitting: Internal Medicine

## 2015-07-16 ENCOUNTER — Ambulatory Visit: Payer: Medicare Other | Admitting: Podiatry

## 2015-07-29 DIAGNOSIS — H353221 Exudative age-related macular degeneration, left eye, with active choroidal neovascularization: Secondary | ICD-10-CM | POA: Diagnosis not present

## 2015-07-30 ENCOUNTER — Encounter: Payer: Self-pay | Admitting: Podiatry

## 2015-07-30 ENCOUNTER — Ambulatory Visit (INDEPENDENT_AMBULATORY_CARE_PROVIDER_SITE_OTHER): Payer: Medicare Other | Admitting: Podiatry

## 2015-07-30 DIAGNOSIS — M79676 Pain in unspecified toe(s): Secondary | ICD-10-CM | POA: Diagnosis not present

## 2015-07-30 DIAGNOSIS — B351 Tinea unguium: Secondary | ICD-10-CM

## 2015-07-30 MED ORDER — NONFORMULARY OR COMPOUNDED ITEM: Status: DC

## 2015-08-09 NOTE — Progress Notes (Signed)
Patient ID: Alexander Mcdonald, male   DOB: 05-Nov-1926, 82 y.o.   MRN: [id number]  Subjective: 82 y.o. returns the office today for painful, elongated, thickened toenails which he cannot trim himself. Denies any redness or drainage around the nails. Denies any acute changes since last appointment and no new complaints today. Denies any systemic complaints such as fevers, chills, nausea, vomiting.   Objective: AAO 3, NAD DP/PT pulses palpable 1/4 Chronic lower extremity edema present. Nails hypertrophic, dystrophic, elongated, brittle, discolored 10. There is tenderness overlying the nails 1-5 bilaterally. There is no surrounding erythema or drainage along the nail sites. No open lesions or pre-ulcerative lesions are identified. No other areas of tenderness bilateral lower extremities. No overlying edema, erythema, increased warmth. No pain with calf compression, swelling, warmth, erythema.  Assessment: Patient presents with symptomatic onychomycosis  Plan: -Treatment options including alternatives, risks, complications were discussed -Nails sharply debrided 10 without complication/bleeding. -Discussed daily foot inspection. If there are any changes, to call the office immediately.  -Follow-up as scheduled or sooner if any problems are to arise. In the meantime, encouraged to call the office with any questions, concerns, changes symptoms.  Celesta Gentile, DPM

## 2015-09-06 ENCOUNTER — Ambulatory Visit: Payer: Medicare Other | Admitting: Podiatry

## 2015-09-17 ENCOUNTER — Encounter: Payer: Self-pay | Admitting: Internal Medicine

## 2015-09-17 ENCOUNTER — Ambulatory Visit (INDEPENDENT_AMBULATORY_CARE_PROVIDER_SITE_OTHER): Payer: Medicare Other | Admitting: Internal Medicine

## 2015-09-17 VITALS — BP 136/60 | HR 59 | Temp 97.9°F | Resp 20 | Ht 66.0 in | Wt 177.0 lb

## 2015-09-17 DIAGNOSIS — I10 Essential (primary) hypertension: Secondary | ICD-10-CM | POA: Diagnosis not present

## 2015-09-17 DIAGNOSIS — Z0000 Encounter for general adult medical examination without abnormal findings: Secondary | ICD-10-CM

## 2015-09-17 DIAGNOSIS — N183 Chronic kidney disease, stage 3 unspecified: Secondary | ICD-10-CM

## 2015-09-17 DIAGNOSIS — E7800 Pure hypercholesterolemia, unspecified: Secondary | ICD-10-CM | POA: Diagnosis not present

## 2015-09-17 LAB — COMPREHENSIVE METABOLIC PANEL
ALT: 18 U/L (ref 0–53)
AST: 15 U/L (ref 0–37)
Albumin: 4.3 g/dL (ref 3.5–5.2)
Alkaline Phosphatase: 65 U/L (ref 39–117)
BUN: 30 mg/dL — ABNORMAL HIGH (ref 6–23)
CO2: 25 mEq/L (ref 19–32)
Calcium: 8.9 mg/dL (ref 8.4–10.5)
Chloride: 104 mEq/L (ref 96–112)
Creatinine, Ser: 1.89 mg/dL — ABNORMAL HIGH (ref 0.40–1.50)
GFR: 36.02 mL/min — ABNORMAL LOW (ref >60.00)
Glucose, Bld: 105 mg/dL — ABNORMAL HIGH (ref 70–99)
Potassium: 4.9 mEq/L (ref 3.5–5.1)
Sodium: 136 mEq/L (ref 135–145)
Total Bilirubin: 0.7 mg/dL (ref 0.2–1.2)
Total Protein: 6.9 g/dL (ref 6.0–8.3)

## 2015-09-17 LAB — CBC WITH DIFFERENTIAL/PLATELET
Basophils Absolute: 0 10*3/uL (ref 0.0–0.1)
Basophils Relative: 0.4 % (ref 0.0–3.0)
Eosinophils Absolute: 0.2 10*3/uL (ref 0.0–0.7)
Eosinophils Relative: 2.7 % (ref 0.0–5.0)
HCT: 44.3 % (ref 39.0–52.0)
Hemoglobin: 15.1 g/dL (ref 13.0–17.0)
Lymphocytes Relative: 24.6 % (ref 12.0–46.0)
Lymphs Abs: 2 10*3/uL (ref 0.7–4.0)
MCHC: 34.1 g/dL (ref 30.0–36.0)
MCV: 90.4 fl (ref 78.0–100.0)
Monocytes Absolute: 0.7 10*3/uL (ref 0.1–1.0)
Monocytes Relative: 8.1 % (ref 3.0–12.0)
Neutro Abs: 5.3 10*3/uL (ref 1.4–7.7)
Neutrophils Relative %: 64.2 % (ref 43.0–77.0)
Platelets: 202 10*3/uL (ref 150.0–400.0)
RBC: 4.9 Mil/uL (ref 4.22–5.81)
RDW: 14.1 % (ref 11.5–15.5)
WBC: 8.2 10*3/uL (ref 4.0–10.5)

## 2015-09-17 LAB — TSH: TSH: 6.07 u[IU]/mL — ABNORMAL HIGH (ref 0.35–4.50)

## 2015-09-17 MED ORDER — AMLODIPINE BESYLATE 2.5 MG PO TABS: ORAL_TABLET | ORAL | 3 refills | Status: DC

## 2015-09-17 MED ORDER — ATORVASTATIN CALCIUM 10 MG PO TABS: 10.0000 mg | ORAL_TABLET | Freq: Every day | ORAL | 3 refills | Status: DC

## 2015-09-17 NOTE — Progress Notes (Signed)
Subjective:    Patient ID: Alexander Mcdonald, male    DOB: June 20, 1926, 82 y.o.   MRN: [id number]  RLR 71 international  82 year old patient seen today for a preventive health exam.  He is doing quite well.  He does have a history of carotid artery stenosis, status post left CEA.  He has essential hypertension.  He also has a history of BPH and chronic kidney disease. Over the last year.  He has been seen by infectious disease due to recurrent UTIs and also has seen podiatry on a number of occasions. Over the past year.  He has had some lower extremity edema.  Venous Doppler evaluation of the lower extremities revealed no DVT   Past Medical History:  Diagnosis Date  . ACUTE RENAL FAILURE W/LESION OF TUBULAR NECROSIS 01/04/2007  . Bladder spasm 06/04/2014  . BPH (benign prostatic hyperplasia) 06/04/2014  . CAROTID ARTERY DISEASE 06/01/2009  . DEPRESSION 07/25/2006  . DIVERTICULOSIS OF COLON 02/01/2007  . Glaucoma   . GOUT 07/25/2006  . HEMORRHOID, THROMBOSED 02/15/2009  . HYPERCHOLESTEROLEMIA 07/25/2006  . HYPERTENSION 07/25/2006  . PROSTATE CANCER, HX OF 07/25/2006  . Rosacea 09/28/2008  . WEIGHT LOSS 09/01/2009     Social History   Social History  . Marital status: Married    Spouse name: N/A  . Number of children: N/A  . Years of education: N/A   Occupational History  . Not on file.   Social History Main Topics  . Smoking status: Former Smoker    Quit date: 01/17/1988  . Smokeless tobacco: Never Used  . Alcohol use 3.5 oz/week    7 drink(s) per week     Comment: beer  . Drug use: No  . Sexual activity: No   Other Topics Concern  . Not on file   Social History Narrative  . No narrative on file    Past Surgical History:  Procedure Laterality Date  . CAROTID ENDARTERECTOMY    . CATARACT EXTRACTION      No family history on file.  Allergies  Allergen Reactions  . Shellfish Allergy Anaphylaxis    Pt is allergic to mussels     Current Outpatient Prescriptions on File  Prior to Visit  Medication Sig Dispense Refill  . amLODipine (NORVASC) 5 MG tablet TAKE 1 TABLET BY MOUTH EVERY DAY 90 tablet 3  . AZOPT 1 % ophthalmic suspension Place 1 drop into the left eye 2 (two) times daily.   11  . fish oil-omega-3 fatty acids 1000 MG capsule Take 1 g by mouth daily.    . fosinopril (MONOPRIL) 20 MG tablet TAKE 1 TABLET BY MOUTH DAILY 90 tablet 1  . LUMIGAN 0.01 % SOLN Place 1 drop into the left eye at bedtime.     . Meth-Hyo-M Bl-Na Phos-Ph Sal (URIBEL) 118 MG CAPS Take 1 capsule by mouth as needed.     Marland Kitchen MYRBETRIQ 25 MG TB24 tablet TAKE 1 TABLET BY MOUTH DAILY 90 tablet 1  . NONFORMULARY OR COMPOUNDED ITEM [facility]: Peripheral Neuropathy Cream - Bupivacaine 1%, Doxepin 3%, Gabapentin 6%, Pentoxifylline 3%, Topiramate 1%, apply 1-2 grams to affected area 3-4 times daily. 120 each 2  . tamsulosin (FLOMAX) 0.4 MG CAPS capsule TAKE ONE CAPSULE BY MOUTH EVERY DAY 90 capsule 3   No current facility-administered medications on file prior to visit.     BP 136/60 (BP Location: Left Arm, Patient Position: Sitting, Cuff Size: Normal)   Pulse (!) 59   Temp 97.9  F (36.6 C) (Oral)   Resp 20   Ht 5\' 6"  (1.676 m)   Wt 177 lb (80.3 kg)   SpO2 96%   BMI 28.57 kg/m      For the past few months he has been living with his daughter  BP Readings from Last 3 Encounters:  09/17/15 136/60  10/27/14 (!) 165/69  10/16/14 (!) 153/74    Wt Readings from Last 3 Encounters:  09/17/15 177 lb (80.3 kg)  10/27/14 175 lb (79.4 kg)  09/15/14 175 lb (79.4 kg)    Here for Medicare AWV:   1. Risk factors based on Past M, S, F history: cardiovascular risk factors include hypertension, and hypercholesterolemia  2. Physical Activities: remains very active physically, although limited by partial blindness  3. Depression/mood: history of depression, which has been stable. No current treatment  4. Hearing: no deficits  5. ADL's: independent in all aspects of daily living  6.  Fall Risk: moderate due to visual deficits  7. Home Safety: no problems identified  8. Height, weight, &visual acuity:height and weight stable. Light perception only involving the right eye  9. Counseling: heart healthy diet regular. Exercise encouraged  10. Labs ordered based on risk factors: laboratory profile, including lipid panel be reviewed  11. Referral Coordination- will follow-up with ophthalmology and urology  12. Care Plan- heart healthy diet regular exercise. All encouraged modest weight loss encouraged  13. Cognitive Assessment- alert and oriented normal affect. No history of memory dysfunction, handles all executive functioning  14.  Preventive services will include annual clinical exams with screening lab.  Annual ophthalmology evaluations.  Encouraged. 15.  Provider list includes primary care and ophthalmology, infectious disease and urology as well as podiatry   Past History:  Past Medical History:   Prostate cancer, hx of  Depression  Gout  Hypertension  glaucoma  Renal insufficiency  history of acute renal failure  Carotid artery disease  Past Surgical History:   Carotid endarterectomy  diagnosed with prostate cancer in 2002. He was Gleason 6 involve the right middle lobe  Cataract extraction right eye 2009  colonoscopy June 2010  carotid artery Doppler ultrasound, April 2010, 5-11 5-12, 6-13, 6-15,  6-16  Family History:   Fam hx Leukemia  Family History Hypertension  both parents died at 57 brother may have had coronary artery disease. Sister died of leukemia  two brothers, 3 sisters   Social History:   Widower; son in [place].  Daughter lives with patient Former Smoker  Alcohol use-yes     Review of Systems  Constitutional: Negative for activity change, appetite change, chills, fatigue and fever.  HENT: Negative for congestion, dental problem, ear pain, hearing loss, mouth sores, rhinorrhea, sinus pressure, sneezing, tinnitus, trouble swallowing  and voice change.   Eyes: Negative for photophobia, pain, redness and visual disturbance.  Respiratory: Negative for apnea, cough, choking, chest tightness, shortness of breath and wheezing.   Cardiovascular: Negative for chest pain, palpitations and leg swelling.  Gastrointestinal: Negative for abdominal distention, abdominal pain, anal bleeding, blood in stool, constipation, diarrhea, nausea, rectal pain and vomiting.  Genitourinary: Negative for decreased urine volume, difficulty urinating, discharge, dysuria, flank pain, frequency, genital sores, hematuria, penile swelling, scrotal swelling, testicular pain and urgency.  Musculoskeletal: Negative for arthralgias, back pain, gait problem, joint swelling, myalgias, neck pain and neck stiffness.  Skin: Negative for color change, rash and wound.  Neurological: Positive for light-headedness. Negative for dizziness, tremors, seizures, syncope, facial asymmetry, speech difficulty, weakness, numbness and  headaches.  Hematological: Negative for adenopathy. Does not bruise/bleed easily.  Psychiatric/Behavioral: Negative for agitation, behavioral problems, confusion, decreased concentration, dysphoric mood, hallucinations, self-injury, sleep disturbance and suicidal ideas. The patient is not nervous/anxious.        Objective:   Physical Exam  Constitutional: He appears well-developed and well-nourished.  Blood pressure 140/60 in both arms  HENT:  Head: Normocephalic and atraumatic.  Right Ear: External ear normal.  Left Ear: External ear normal.  Nose: Nose normal.  Mouth/Throat: Oropharynx is clear and moist.  Dentures in place  Eyes: Conjunctivae and EOM are normal. Pupils are equal, round, and reactive to light. No scleral icterus.  Neck: Normal range of motion. Neck supple. No JVD present. No thyromegaly present.  Left carotid endarterectomy scar Bilateral soft bruits versus transmitted murmur in the carotid and supraclavicular areas    Cardiovascular: Regular rhythm.  Exam reveals no gallop and no friction rub.   Murmur heard. Grade 3 over 6 systolic murmur heard diffusely Bilateral femoral bruits Posterior tibial pulses plus 1.  Dorsalis pedis pulses not easily palpable  Pulmonary/Chest: Effort normal and breath sounds normal. He exhibits no tenderness.  Few bibasilar crackles  Abdominal: Soft. Bowel sounds are normal. He exhibits no distension and no mass. There is no tenderness.  Ventral hernia  Genitourinary: Penis normal.  Genitourinary Comments: Uncircumcised  Musculoskeletal: Normal range of motion. He exhibits edema. He exhibits no tenderness.  Bilateral lower extremity edema  Lymphadenopathy:    He has no cervical adenopathy.  Neurological: He is alert. He has normal reflexes. No cranial nerve deficit. Coordination normal.  Skin: Skin is warm and dry. No rash noted.  Skin the feet very dry with onychomycotic nail changes  Psychiatric: He has a normal mood and affect. His behavior is normal.          Assessment & Plan:   Preventive health examination Carotid artery disease.  Stable Hypertension well controll- decrease amlodipine due to peripheral edema Dyslipidemia.  Continue statin therapy  History prostate cancer.  Followup urology  Recheck 12  Months  [name], MD

## 2015-09-17 NOTE — Patient Instructions (Signed)
Limit your sodium (Salt) intake  Please check your blood pressure on a regular basis.  If it is consistently greater than 150/90, please make an office appointment.  Keep your legs elevated as much as possible  Return in one year for follow-up

## 2015-09-17 NOTE — Progress Notes (Signed)
Pre visit review using our clinic review tool, if applicable. No additional management support is needed unless otherwise documented below in the visit note. 

## 2015-10-07 DIAGNOSIS — H353221 Exudative age-related macular degeneration, left eye, with active choroidal neovascularization: Secondary | ICD-10-CM | POA: Diagnosis not present

## 2015-10-18 DIAGNOSIS — H44511 Absolute glaucoma, right eye: Secondary | ICD-10-CM | POA: Diagnosis not present

## 2015-10-18 DIAGNOSIS — H401133 Primary open-angle glaucoma, bilateral, severe stage: Secondary | ICD-10-CM | POA: Diagnosis not present

## 2015-10-27 ENCOUNTER — Telehealth: Payer: Self-pay | Admitting: Internal Medicine

## 2015-10-27 NOTE — Telephone Encounter (Signed)
I see carotid artery disease listed which could be reason for aspirin but would only do 81mg  even in that case. Dr. Raliegh Ip does not have aspirin on med list. Can do 81mg  aspirin once daily  as long as no history of bleeds until Dr. Raliegh Ip gets back

## 2015-10-27 NOTE — Telephone Encounter (Signed)
Patient's daughter is aware to do the 81mg . Told her I would forward the message to Dr. Raliegh Ip to look at for when he gets back.

## 2015-10-27 NOTE — Telephone Encounter (Signed)
Dr. Yong Channel, do you mind taking a look at this in Dr. Marthann Schiller absence?

## 2015-10-27 NOTE — Telephone Encounter (Signed)
Daughter would like a call back with clarification on the dosage of Aspirin pt should be taking a day. currently pt is taking 325 mg/day.  Please call back  Daughter would like a call back asap and prefers not to wait until Dr Raliegh Ip gets back. She is not sure who even started pt on this med.

## 2015-10-31 NOTE — Telephone Encounter (Signed)
Continue aspirin 81 mg daily.   

## 2015-11-01 NOTE — Telephone Encounter (Signed)
Spoke to pt's daughter [name], told her okay for pt to continue Aspirin 81 mg daily per Dr.K. [name] verbalized understanding.

## 2015-11-02 DIAGNOSIS — C61 Malignant neoplasm of prostate: Secondary | ICD-10-CM | POA: Diagnosis not present

## 2015-11-03 ENCOUNTER — Ambulatory Visit: Payer: Medicare Other

## 2015-11-08 DIAGNOSIS — C61 Malignant neoplasm of prostate: Secondary | ICD-10-CM | POA: Diagnosis not present

## 2015-11-08 DIAGNOSIS — N400 Enlarged prostate without lower urinary tract symptoms: Secondary | ICD-10-CM | POA: Diagnosis not present

## 2015-11-10 DIAGNOSIS — H401123 Primary open-angle glaucoma, left eye, severe stage: Secondary | ICD-10-CM | POA: Diagnosis not present

## 2015-11-10 DIAGNOSIS — H44511 Absolute glaucoma, right eye: Secondary | ICD-10-CM | POA: Diagnosis not present

## 2015-12-02 DIAGNOSIS — Z23 Encounter for immunization: Secondary | ICD-10-CM | POA: Diagnosis not present

## 2015-12-20 DIAGNOSIS — G5761 Lesion of plantar nerve, right lower limb: Secondary | ICD-10-CM | POA: Diagnosis not present

## 2015-12-20 DIAGNOSIS — M79671 Pain in right foot: Secondary | ICD-10-CM | POA: Diagnosis not present

## 2015-12-23 DIAGNOSIS — H353221 Exudative age-related macular degeneration, left eye, with active choroidal neovascularization: Secondary | ICD-10-CM | POA: Diagnosis not present

## 2015-12-27 ENCOUNTER — Other Ambulatory Visit: Payer: Self-pay | Admitting: Internal Medicine

## 2016-02-14 DIAGNOSIS — H44511 Absolute glaucoma, right eye: Secondary | ICD-10-CM | POA: Diagnosis not present

## 2016-02-14 DIAGNOSIS — H401123 Primary open-angle glaucoma, left eye, severe stage: Secondary | ICD-10-CM | POA: Diagnosis not present

## 2016-03-09 DIAGNOSIS — H353221 Exudative age-related macular degeneration, left eye, with active choroidal neovascularization: Secondary | ICD-10-CM | POA: Diagnosis not present

## 2016-03-20 DIAGNOSIS — M65871 Other synovitis and tenosynovitis, right ankle and foot: Secondary | ICD-10-CM | POA: Diagnosis not present

## 2016-03-20 DIAGNOSIS — M25571 Pain in right ankle and joints of right foot: Secondary | ICD-10-CM | POA: Diagnosis not present

## 2016-03-20 DIAGNOSIS — M25774 Osteophyte, right foot: Secondary | ICD-10-CM | POA: Diagnosis not present

## 2016-03-25 ENCOUNTER — Other Ambulatory Visit: Payer: Self-pay | Admitting: Internal Medicine

## 2016-05-04 DIAGNOSIS — H353221 Exudative age-related macular degeneration, left eye, with active choroidal neovascularization: Secondary | ICD-10-CM | POA: Diagnosis not present

## 2016-05-10 ENCOUNTER — Telehealth: Payer: Self-pay | Admitting: Internal Medicine

## 2016-05-10 NOTE — Telephone Encounter (Signed)
OK for Rx

## 2016-05-10 NOTE — Telephone Encounter (Signed)
Please advise 

## 2016-05-10 NOTE — Telephone Encounter (Signed)
Pt needs new rx generic xyzal # 90 sent to [facility]. Pt has tried otc and does not work

## 2016-05-11 ENCOUNTER — Other Ambulatory Visit: Payer: Self-pay

## 2016-05-11 MED ORDER — LEVOCETIRIZINE DIHYDROCHLORIDE 5 MG PO TABS: 5.0000 mg | ORAL_TABLET | Freq: Every evening | ORAL | 0 refills | Status: AC

## 2016-05-11 NOTE — Telephone Encounter (Signed)
Rx has been sent in as directed.  

## 2016-06-20 ENCOUNTER — Other Ambulatory Visit: Payer: Self-pay | Admitting: Internal Medicine

## 2016-07-03 DIAGNOSIS — H44511 Absolute glaucoma, right eye: Secondary | ICD-10-CM | POA: Diagnosis not present

## 2016-07-03 DIAGNOSIS — H401123 Primary open-angle glaucoma, left eye, severe stage: Secondary | ICD-10-CM | POA: Diagnosis not present

## 2016-07-06 DIAGNOSIS — H353221 Exudative age-related macular degeneration, left eye, with active choroidal neovascularization: Secondary | ICD-10-CM | POA: Diagnosis not present

## 2016-07-25 DIAGNOSIS — B353 Tinea pedis: Secondary | ICD-10-CM | POA: Diagnosis not present

## 2016-07-25 DIAGNOSIS — I890 Lymphedema, not elsewhere classified: Secondary | ICD-10-CM | POA: Diagnosis not present

## 2016-07-27 DIAGNOSIS — R609 Edema, unspecified: Secondary | ICD-10-CM | POA: Diagnosis not present

## 2016-07-28 ENCOUNTER — Ambulatory Visit: Payer: Medicare Other | Admitting: Podiatry

## 2016-07-31 DIAGNOSIS — Z961 Presence of intraocular lens: Secondary | ICD-10-CM | POA: Diagnosis not present

## 2016-07-31 DIAGNOSIS — H44511 Absolute glaucoma, right eye: Secondary | ICD-10-CM | POA: Diagnosis not present

## 2016-07-31 DIAGNOSIS — H401123 Primary open-angle glaucoma, left eye, severe stage: Secondary | ICD-10-CM | POA: Diagnosis not present

## 2016-08-02 ENCOUNTER — Ambulatory Visit (INDEPENDENT_AMBULATORY_CARE_PROVIDER_SITE_OTHER): Payer: Medicare Other | Admitting: Internal Medicine

## 2016-08-02 ENCOUNTER — Encounter: Payer: Self-pay | Admitting: Internal Medicine

## 2016-08-02 VITALS — BP 162/72 | HR 61 | Temp 97.9°F | Ht 66.0 in | Wt 176.6 lb

## 2016-08-02 DIAGNOSIS — N183 Chronic kidney disease, stage 3 unspecified: Secondary | ICD-10-CM

## 2016-08-02 DIAGNOSIS — I10 Essential (primary) hypertension: Secondary | ICD-10-CM | POA: Diagnosis not present

## 2016-08-02 MED ORDER — FLUTICASONE PROPIONATE 50 MCG/ACT NA SUSP: 2.0000 | Freq: Every day | NASAL | 6 refills | Status: DC

## 2016-08-02 NOTE — Progress Notes (Signed)
Subjective:    Patient ID: Alexander Mcdonald, male    DOB: 1926-03-22, 83 y.o.   MRN: [id number]  HPI 83 year old patient who is seen today for follow-up. He has a history of essential hypertension, controlled on amlodipine. He has been seen by podiatry recently and due to lower extremity swelling.  Venous Doppler studies were obtained. He has a history of carotid artery disease.  Remains on statin therapy for dyslipidemia. Chief complaint is cough.  This has been present for some time.  He does have chronic postnasal drip.  In spite of daily antihistamine use.  Generally feels well  Past Medical History:  Diagnosis Date  . ACUTE RENAL FAILURE W/LESION OF TUBULAR NECROSIS 01/04/2007  . Bladder spasm 06/04/2014  . BPH (benign prostatic hyperplasia) 06/04/2014  . CAROTID ARTERY DISEASE 06/01/2009  . DEPRESSION 07/25/2006  . DIVERTICULOSIS OF COLON 02/01/2007  . Glaucoma   . GOUT 07/25/2006  . HEMORRHOID, THROMBOSED 02/15/2009  . HYPERCHOLESTEROLEMIA 07/25/2006  . HYPERTENSION 07/25/2006  . PROSTATE CANCER, HX OF 07/25/2006  . Rosacea 09/28/2008  . WEIGHT LOSS 09/01/2009     Social History   Social History  . Marital status: Married    Spouse name: N/A  . Number of children: N/A  . Years of education: N/A   Occupational History  . Not on file.   Social History Main Topics  . Smoking status: Former Smoker    Quit date: 01/17/1988  . Smokeless tobacco: Never Used  . Alcohol use 3.5 oz/week    7 drink(s) per week     Comment: beer  . Drug use: No  . Sexual activity: No   Other Topics Concern  . Not on file   Social History Narrative  . No narrative on file    Past Surgical History:  Procedure Laterality Date  . CAROTID ENDARTERECTOMY    . CATARACT EXTRACTION      No family history on file.  Allergies  Allergen Reactions  . Shellfish Allergy Anaphylaxis    Pt is allergic to mussels   . Brimonidine Itching    Itching and swelling red  . Dorzolamide Hcl-Timolol Mal Other  (See Comments)    Eye itch and redness  . Timolol Itching    Itching rash under eyes.    Current Outpatient Prescriptions on File Prior to Visit  Medication Sig Dispense Refill  . amLODipine (NORVASC) 2.5 MG tablet TAKE 1 TABLET BY MOUTH EVERY DAY 90 tablet 3  . atorvastatin (LIPITOR) 10 MG tablet Take 1 tablet (10 mg total) by mouth daily. 90 tablet 3  . AZOPT 1 % ophthalmic suspension Place 1 drop into the left eye 2 (two) times daily.   11  . fish oil-omega-3 fatty acids 1000 MG capsule Take 1 g by mouth daily.    . fosinopril (MONOPRIL) 20 MG tablet TAKE 1 TABLET BY MOUTH DAILY 90 tablet 0  . levocetirizine (XYZAL) 5 MG tablet Take 1 tablet (5 mg total) by mouth every evening. 90 tablet 0  . LUMIGAN 0.01 % SOLN Place 1 drop into the left eye at bedtime.     . Meth-Hyo-M Bl-Na Phos-Ph Sal (URIBEL) 118 MG CAPS Take 1 capsule by mouth as needed.     Marland Kitchen MYRBETRIQ 25 MG TB24 tablet TAKE 1 TABLET BY MOUTH DAILY 90 tablet 1  . NONFORMULARY OR COMPOUNDED ITEM [facility]: Peripheral Neuropathy Cream - Bupivacaine 1%, Doxepin 3%, Gabapentin 6%, Pentoxifylline 3%, Topiramate 1%, apply 1-2 grams to affected area 3-4  times daily. 120 each 2  . tamsulosin (FLOMAX) 0.4 MG CAPS capsule TAKE ONE CAPSULE BY MOUTH EVERY DAY 90 capsule 3   No current facility-administered medications on file prior to visit.     BP (!) 162/72   Pulse 61   Temp 97.9 F (36.6 C)   Ht 5\' 6"  (1.676 m)   Wt 176 lb 9.6 oz (80.1 kg)   SpO2 94%   BMI 28.50 kg/m      Review of Systems  Constitutional: Negative for appetite change, chills, fatigue and fever.  HENT: Positive for postnasal drip. Negative for congestion, dental problem, ear pain, hearing loss, sore throat, tinnitus, trouble swallowing and voice change.   Eyes: Negative for pain, discharge and visual disturbance.  Respiratory: Positive for cough. Negative for chest tightness, wheezing and stridor.   Cardiovascular: Negative for chest pain,  palpitations and leg swelling.  Gastrointestinal: Negative for abdominal distention, abdominal pain, blood in stool, constipation, diarrhea, nausea and vomiting.  Genitourinary: Negative for difficulty urinating, discharge, flank pain, genital sores, hematuria and urgency.  Musculoskeletal: Negative for arthralgias, back pain, gait problem, joint swelling, myalgias and neck stiffness.  Skin: Negative for rash.  Neurological: Negative for dizziness, syncope, speech difficulty, weakness, numbness and headaches.  Hematological: Negative for adenopathy. Does not bruise/bleed easily.  Psychiatric/Behavioral: Negative for behavioral problems and dysphoric mood. The patient is not nervous/anxious.        Objective:   Physical Exam  Constitutional: He is oriented to person, place, and time. He appears well-developed.  HENT:  Head: Normocephalic.  Right Ear: External ear normal.  Left Ear: External ear normal.  Eyes: Conjunctivae and EOM are normal.  Neck: Normal range of motion.  Bilateral bruits  Cardiovascular: Normal rate.   Murmur heard. Grade 3/6 systolic murmur  Pulmonary/Chest: Breath sounds normal.  Abdominal: Bowel sounds are normal.  Musculoskeletal: Normal range of motion. He exhibits edema. He exhibits no tenderness.  Lower extremity edema, right greater than left  Neurological: He is alert and oriented to person, place, and time.  Psychiatric: He has a normal mood and affect. His behavior is normal.          Assessment & Plan:   Chronic cough.  Probable upper airway cough syndrome.  Coughing does not seem to be related to meals.  He has had a fairly unremarkable swallowing study in the past.  Patient is on an ACE inhibitor Carotid artery stenosis.  We'll schedule CPX in the fall in consider follow-up studies at that time Chronic lower extremity edema.  Multifactorial.  History of recent negative venous Doppler study Essential hypertension.  Repeat blood pressure 140/60.   Consider discontinuation of ACE inhibitor in the fall if cough persists   Trial fluticasone nasal spray CPX 3-4 months  [name]

## 2016-08-02 NOTE — Patient Instructions (Signed)
Limit your sodium (Salt) intake  Please check your blood pressure on a regular basis.  If it is consistently greater than 150/90, please make an office appointment.  Return in 3-4 months for follow-up  Limit your sodium (Salt) intake

## 2016-08-28 ENCOUNTER — Other Ambulatory Visit: Payer: Self-pay | Admitting: Internal Medicine

## 2016-09-14 ENCOUNTER — Other Ambulatory Visit: Payer: Self-pay | Admitting: Internal Medicine

## 2016-09-14 DIAGNOSIS — H353221 Exudative age-related macular degeneration, left eye, with active choroidal neovascularization: Secondary | ICD-10-CM | POA: Diagnosis not present

## 2016-09-27 ENCOUNTER — Other Ambulatory Visit: Payer: Self-pay | Admitting: Internal Medicine

## 2016-10-05 ENCOUNTER — Encounter: Payer: Self-pay | Admitting: Internal Medicine

## 2016-10-24 DIAGNOSIS — M79675 Pain in left toe(s): Secondary | ICD-10-CM | POA: Diagnosis not present

## 2016-10-24 DIAGNOSIS — B351 Tinea unguium: Secondary | ICD-10-CM | POA: Diagnosis not present

## 2016-10-24 DIAGNOSIS — M79674 Pain in right toe(s): Secondary | ICD-10-CM | POA: Diagnosis not present

## 2016-11-09 ENCOUNTER — Telehealth: Payer: Self-pay | Admitting: Internal Medicine

## 2016-11-09 DIAGNOSIS — Z8546 Personal history of malignant neoplasm of prostate: Secondary | ICD-10-CM

## 2016-11-09 NOTE — Telephone Encounter (Signed)
Daughter states Dr Arman Bogus wants pt to have a psa. Pt has the order from dr Arman Bogus.  Is it ok for pt to have this done at his appt on 11/06?

## 2016-11-10 NOTE — Telephone Encounter (Signed)
Please advise 

## 2016-11-13 NOTE — Telephone Encounter (Signed)
Okay for PSA

## 2016-11-14 NOTE — Telephone Encounter (Signed)
Order placed in epic

## 2016-11-14 NOTE — Addendum Note (Signed)
Addended by: Abelardo Diesel on: 11/14/2016 01:30 PM   Modules accepted: Orders

## 2016-11-16 DIAGNOSIS — H353221 Exudative age-related macular degeneration, left eye, with active choroidal neovascularization: Secondary | ICD-10-CM | POA: Diagnosis not present

## 2016-11-19 ENCOUNTER — Other Ambulatory Visit: Payer: Self-pay | Admitting: Internal Medicine

## 2016-11-21 ENCOUNTER — Ambulatory Visit: Payer: Medicare Other | Admitting: Internal Medicine

## 2016-11-30 ENCOUNTER — Encounter: Payer: Self-pay | Admitting: Internal Medicine

## 2016-11-30 ENCOUNTER — Ambulatory Visit (INDEPENDENT_AMBULATORY_CARE_PROVIDER_SITE_OTHER): Payer: Medicare Other | Admitting: Internal Medicine

## 2016-11-30 VITALS — BP 128/62 | HR 62 | Temp 97.7°F | Ht 66.0 in | Wt 176.4 lb

## 2016-11-30 DIAGNOSIS — E7800 Pure hypercholesterolemia, unspecified: Secondary | ICD-10-CM | POA: Diagnosis not present

## 2016-11-30 DIAGNOSIS — I10 Essential (primary) hypertension: Secondary | ICD-10-CM | POA: Diagnosis not present

## 2016-11-30 DIAGNOSIS — Z8546 Personal history of malignant neoplasm of prostate: Secondary | ICD-10-CM

## 2016-11-30 DIAGNOSIS — Z23 Encounter for immunization: Secondary | ICD-10-CM | POA: Diagnosis not present

## 2016-11-30 DIAGNOSIS — N183 Chronic kidney disease, stage 3 unspecified: Secondary | ICD-10-CM

## 2016-11-30 LAB — CBC WITH DIFFERENTIAL/PLATELET
Basophils Absolute: 0.1 10*3/uL (ref 0.0–0.1)
Basophils Relative: 0.7 % (ref 0.0–3.0)
Eosinophils Absolute: 0.2 10*3/uL (ref 0.0–0.7)
Eosinophils Relative: 2.4 % (ref 0.0–5.0)
HCT: 43.5 % (ref 39.0–52.0)
Hemoglobin: 14.5 g/dL (ref 13.0–17.0)
Lymphocytes Relative: 24.4 % (ref 12.0–46.0)
Lymphs Abs: 2 10*3/uL (ref 0.7–4.0)
MCHC: 33.4 g/dL (ref 30.0–36.0)
MCV: 91 fl (ref 78.0–100.0)
Monocytes Absolute: 0.7 10*3/uL (ref 0.1–1.0)
Monocytes Relative: 8.7 % (ref 3.0–12.0)
Neutro Abs: 5.1 10*3/uL (ref 1.4–7.7)
Neutrophils Relative %: 63.8 % (ref 43.0–77.0)
Platelets: 196 10*3/uL (ref 150.0–400.0)
RBC: 4.78 Mil/uL (ref 4.22–5.81)
RDW: 14.6 % (ref 11.5–15.5)
WBC: 8 10*3/uL (ref 4.0–10.5)

## 2016-11-30 LAB — COMPREHENSIVE METABOLIC PANEL
ALT: 17 U/L (ref 0–53)
AST: 15 U/L (ref 0–37)
Albumin: 4.5 g/dL (ref 3.5–5.2)
Alkaline Phosphatase: 64 U/L (ref 39–117)
BUN: 35 mg/dL — ABNORMAL HIGH (ref 6–23)
CO2: 29 mEq/L (ref 19–32)
Calcium: 9.7 mg/dL (ref 8.4–10.5)
Chloride: 100 mEq/L (ref 96–112)
Creatinine, Ser: 2.2 mg/dL — ABNORMAL HIGH (ref 0.40–1.50)
GFR: 30.14 mL/min — ABNORMAL LOW (ref >60.00)
Glucose, Bld: 97 mg/dL (ref 70–99)
Potassium: 4.3 mEq/L (ref 3.5–5.1)
Sodium: 137 mEq/L (ref 135–145)
Total Bilirubin: 1 mg/dL (ref 0.2–1.2)
Total Protein: 7.2 g/dL (ref 6.0–8.3)

## 2016-11-30 LAB — TSH: TSH: 8.24 u[IU]/mL — ABNORMAL HIGH (ref 0.35–4.50)

## 2016-11-30 LAB — LIPID PANEL
Cholesterol: 181 mg/dL (ref 0–200)
HDL: 48 mg/dL (ref >39.00)
LDL Cholesterol: 100 mg/dL — ABNORMAL HIGH (ref 0–99)
NonHDL: 133.22
Total CHOL/HDL Ratio: 4
Triglycerides: 166 mg/dL — ABNORMAL HIGH (ref 0.0–149.0)
VLDL: 33.2 mg/dL (ref 0.0–40.0)

## 2016-11-30 MED ORDER — LEVOTHYROXINE SODIUM 50 MCG PO TABS: 50.0000 ug | ORAL_TABLET | Freq: Every day | ORAL | 3 refills | Status: DC

## 2016-11-30 NOTE — Patient Instructions (Addendum)
Limit your sodium (Salt) intake  Please check your blood pressure on a regular basis.  If it is consistently greater than 150/90, please make an office appointment.   DASH Eating Plan DASH stands for "Dietary Approaches to Stop Hypertension." The DASH eating plan is a healthy eating plan that has been shown to reduce high blood pressure (hypertension). It may also reduce your risk for type 2 diabetes, heart disease, and stroke. The DASH eating plan may also help with weight loss. What are tips for following this plan? General guidelines  Avoid eating more than 2,300 mg (milligrams) of salt (sodium) a day. If you have hypertension, you may need to reduce your sodium intake to 1,500 mg a day.  Limit alcohol intake to no more than 1 drink a day for nonpregnant women and 2 drinks a day for men. One drink equals 12 oz of beer, 5 oz of wine, or 1 oz of hard liquor.  Work with your health care provider to maintain a healthy body weight or to lose weight. Ask what an ideal weight is for you.  Get at least 30 minutes of exercise that causes your heart to beat faster (aerobic exercise) most days of the week. Activities may include walking, swimming, or biking.  Work with your health care provider or diet and nutrition specialist (dietitian) to adjust your eating plan to your individual calorie needs. Reading food labels  Check food labels for the amount of sodium per serving. Choose foods with less than 5 percent of the Daily Value of sodium. Generally, foods with less than 300 mg of sodium per serving fit into this eating plan.  To find whole grains, look for the word "whole" as the first word in the ingredient list. Shopping  Buy products labeled as "low-sodium" or "no salt added."  Buy fresh foods. Avoid canned foods and premade or frozen meals. Cooking  Avoid adding salt when cooking. Use salt-free seasonings or herbs instead of table salt or sea salt. Check with your health care provider  or pharmacist before using salt substitutes.  Do not fry foods. Cook foods using healthy methods such as baking, boiling, grilling, and broiling instead.  Cook with heart-healthy oils, such as olive, canola, soybean, or sunflower oil. Meal planning   Eat a balanced diet that includes: ? 5 or more servings of fruits and vegetables each day. At each meal, try to fill half of your plate with fruits and vegetables. ? Up to 6-8 servings of whole grains each day. ? Less than 6 oz of lean meat, poultry, or fish each day. A 3-oz serving of meat is about the same size as a deck of cards. One egg equals 1 oz. ? 2 servings of low-fat dairy each day. ? A serving of nuts, seeds, or beans 5 times each week. ? Heart-healthy fats. Healthy fats called Omega-3 fatty acids are found in foods such as flaxseeds and coldwater fish, like sardines, salmon, and mackerel.  Limit how much you eat of the following: ? Canned or prepackaged foods. ? Food that is high in trans fat, such as fried foods. ? Food that is high in saturated fat, such as fatty meat. ? Sweets, desserts, sugary drinks, and other foods with added sugar. ? Full-fat dairy products.  Do not salt foods before eating.  Try to eat at least 2 vegetarian meals each week.  Eat more home-cooked food and less restaurant, buffet, and fast food.  When eating at a restaurant, ask that your  food be prepared with less salt or no salt, if possible. What foods are recommended? The items listed may not be a complete list. Talk with your dietitian about what dietary choices are best for you. Grains Whole-grain or whole-wheat bread. Whole-grain or whole-wheat pasta. Brown rice. [place] Morrow. Bulgur. Whole-grain and low-sodium cereals. Pita bread. Low-fat, low-sodium crackers. Whole-wheat flour tortillas. Vegetables Fresh or frozen vegetables (raw, steamed, roasted, or grilled). Low-sodium or reduced-sodium tomato and vegetable juice. Low-sodium or  reduced-sodium tomato sauce and tomato paste. Low-sodium or reduced-sodium canned vegetables. Fruits All fresh, dried, or frozen fruit. Canned fruit in natural juice (without added sugar). Meat and other protein foods Skinless chicken or [place]. Ground chicken or [place]. Pork with fat trimmed off. Fish and seafood. Egg whites. Dried beans, peas, or lentils. Unsalted nuts, nut butters, and seeds. Unsalted canned beans. Lean cuts of beef with fat trimmed off. Low-sodium, lean deli meat. Dairy Low-fat (1%) or fat-free (skim) milk. Fat-free, low-fat, or reduced-fat cheeses. Nonfat, low-sodium ricotta or cottage cheese. Low-fat or nonfat yogurt. Low-fat, low-sodium cheese. Fats and oils Soft margarine without trans fats. Vegetable oil. Low-fat, reduced-fat, or light mayonnaise and salad dressings (reduced-sodium). Canola, safflower, olive, soybean, and sunflower oils. Avocado. Seasoning and other foods Herbs. Spices. Seasoning mixes without salt. Unsalted popcorn and pretzels. Fat-free sweets. What foods are not recommended? The items listed may not be a complete list. Talk with your dietitian about what dietary choices are best for you. Grains Baked goods made with fat, such as croissants, muffins, or some breads. Dry pasta or rice meal packs. Vegetables Creamed or fried vegetables. Vegetables in a cheese sauce. Regular canned vegetables (not low-sodium or reduced-sodium). Regular canned tomato sauce and paste (not low-sodium or reduced-sodium). Regular tomato and vegetable juice (not low-sodium or reduced-sodium). Angie Fava. Olives. Fruits Canned fruit in a light or heavy syrup. Fried fruit. Fruit in cream or butter sauce. Meat and other protein foods Fatty cuts of meat. Ribs. Fried meat. Berniece Salines. Sausage. Bologna and other processed lunch meats. Salami. Fatback. Hotdogs. Bratwurst. Salted nuts and seeds. Canned beans with added salt. Canned or smoked fish. Whole eggs or egg yolks. Chicken or [place]  with skin. Dairy Whole or 2% milk, cream, and half-and-half. Whole or full-fat cream cheese. Whole-fat or sweetened yogurt. Full-fat cheese. Nondairy creamers. Whipped toppings. Processed cheese and cheese spreads. Fats and oils Butter. Stick margarine. Lard. Shortening. Ghee. Bacon fat. Tropical oils, such as coconut, palm kernel, or palm oil. Seasoning and other foods Salted popcorn and pretzels. Onion salt, garlic salt, seasoned salt, table salt, and sea salt. Worcestershire sauce. Tartar sauce. Barbecue sauce. Teriyaki sauce. Soy sauce, including reduced-sodium. Steak sauce. Canned and packaged gravies. Fish sauce. Oyster sauce. Cocktail sauce. Horseradish that you find on the shelf. Ketchup. Mustard. Meat flavorings and tenderizers. Bouillon cubes. Hot sauce and Tabasco sauce. Premade or packaged marinades. Premade or packaged taco seasonings. Relishes. Regular salad dressings. Where to find more information:  National Heart, Lung, and [place]: https://wilson-eaton.com/  American Heart Association: www.heart.org Summary  The DASH eating plan is a healthy eating plan that has been shown to reduce high blood pressure (hypertension). It may also reduce your risk for type 2 diabetes, heart disease, and stroke.  With the DASH eating plan, you should limit salt (sodium) intake to 2,300 mg a day. If you have hypertension, you may need to reduce your sodium intake to 1,500 mg a day.  When on the DASH eating plan, aim to eat more fresh fruits and vegetables,  whole grains, lean proteins, low-fat dairy, and heart-healthy fats.  Work with your health care provider or diet and nutrition specialist (dietitian) to adjust your eating plan to your individual calorie needs. This information is not intended to replace advice given to you by your health care provider. Make sure you discuss any questions you have with your health care provider. Document Released: 12/22/2010 Document Revised: 12/27/2015  Document Reviewed: 12/27/2015 Elsevier Interactive Patient Education  2017 [employer].

## 2016-11-30 NOTE — Progress Notes (Signed)
Subjective:    Patient ID: Alexander Mcdonald, male    DOB: 1926-04-18, 83 y.o.   MRN: [id number]  HPI 83 year old patient who is seen today for an annual examination and subsequent Medicare wellness visit.  Medical issues include hypertension and dyslipidemia.  He is followed annually by urology with a history of prostate cancer.  He has chronic kidney disease and remote history of depression.  He has carotid artery disease and is status post left CEA.  He does have a history of gout. Doing quite well today.  Accompanied by 1 of his daughters.  Past Medical History:   Prostate cancer, hx of  Depression  Gout  Hypertension  glaucoma  Renal insufficiency  history of acute renal failure  Carotid artery disease  Past Surgical History:   Carotid endarterectomy  diagnosed with prostate cancer in 2002. He was Gleason 6 involve the right middle lobe  Cataract extraction right eye 2009  colonoscopy June 2010  carotid artery Doppler ultrasound, April 2010, 5-11 5-12, 6-13, 6-15,  6-16  Family History:   Fam hx Leukemia  Family History Hypertension  both parents died at 68 brother may have had coronary artery disease. Sister died of leukemia  two brothers, 3 sisters   Social History:   Widower; son in [place].  Daughter lives with patient Former Smoker  Alcohol use-yes   Past Medical History:  Diagnosis Date  . ACUTE RENAL FAILURE W/LESION OF TUBULAR NECROSIS 01/04/2007  . Bladder spasm 06/04/2014  . BPH (benign prostatic hyperplasia) 06/04/2014  . CAROTID ARTERY DISEASE 06/01/2009  . DEPRESSION 07/25/2006  . DIVERTICULOSIS OF COLON 02/01/2007  . Glaucoma   . GOUT 07/25/2006  . HEMORRHOID, THROMBOSED 02/15/2009  . HYPERCHOLESTEROLEMIA 07/25/2006  . HYPERTENSION 07/25/2006  . PROSTATE CANCER, HX OF 07/25/2006  . Rosacea 09/28/2008  . WEIGHT LOSS 09/01/2009     Social History   Socioeconomic History  . Marital status: Married    Spouse name: Not on file  . Number of children:  Not on file  . Years of education: Not on file  . Highest education level: Not on file  Social Needs  . Financial resource strain: Not on file  . Food insecurity - worry: Not on file  . Food insecurity - inability: Not on file  . Transportation needs - medical: Not on file  . Transportation needs - non-medical: Not on file  Occupational History  . Not on file  Tobacco Use  . Smoking status: Former Smoker    Last attempt to quit: 01/17/1988    Years since quitting: 28.8  . Smokeless tobacco: Never Used  Substance and Sexual Activity  . Alcohol use: Yes    Alcohol/week: 3.5 oz    Types: 7 drink(s) per week    Comment: beer  . Drug use: No  . Sexual activity: No  Other Topics Concern  . Not on file  Social History Narrative  . Not on file    Past Surgical History:  Procedure Laterality Date  . CAROTID ENDARTERECTOMY    . CATARACT EXTRACTION      History reviewed. No pertinent family history.  Allergies  Allergen Reactions  . Shellfish Allergy Anaphylaxis    Pt is allergic to mussels   . Brimonidine Itching    Itching and swelling red  . Dorzolamide Hcl-Timolol Mal Other (See Comments)    Eye itch and redness  . Timolol Itching    Itching rash under eyes.    Current Outpatient Medications on  File Prior to Visit  Medication Sig Dispense Refill  . amLODipine (NORVASC) 2.5 MG tablet TAKE 1 TABLET BY MOUTH EVERY DAY 90 tablet 0  . aspirin 325 MG tablet Take 325 mg by mouth.    Marland Kitchen atorvastatin (LIPITOR) 10 MG tablet TAKE 1 TABLET(10 MG) BY MOUTH DAILY 90 tablet 0  . AZOPT 1 % ophthalmic suspension Place 1 drop into the left eye 2 (two) times daily.   11  . fish oil-omega-3 fatty acids 1000 MG capsule Take 1 g by mouth daily.    . fluticasone (FLONASE) 50 MCG/ACT nasal spray Place 2 sprays into both nostrils daily. 16 g 6  . fosinopril (MONOPRIL) 20 MG tablet TAKE 1 TABLET BY MOUTH DAILY 90 tablet 0  . levocetirizine (XYZAL) 5 MG tablet Take 1 tablet (5 mg total) by mouth  every evening. 90 tablet 0  . LUMIGAN 0.01 % SOLN Place 1 drop into the left eye at bedtime.     . Meth-Hyo-M Bl-Na Phos-Ph Sal (URIBEL) 118 MG CAPS Take 1 capsule by mouth as needed.     Marland Kitchen MYRBETRIQ 25 MG TB24 tablet TAKE 1 TABLET BY MOUTH DAILY 90 tablet 1  . NONFORMULARY OR COMPOUNDED ITEM [facility]: Peripheral Neuropathy Cream - Bupivacaine 1%, Doxepin 3%, Gabapentin 6%, Pentoxifylline 3%, Topiramate 1%, apply 1-2 grams to affected area 3-4 times daily. 120 each 2  . tamsulosin (FLOMAX) 0.4 MG CAPS capsule TAKE ONE CAPSULE BY MOUTH EVERY DAY 90 capsule 3  . Travoprost, BAK Free, (TRAVATAN Z) 0.004 % SOLN ophthalmic solution Place 1 drop into the left eye every evening.     No current facility-administered medications on file prior to visit.     BP 128/62 (BP Location: Left Arm, Patient Position: Sitting, Cuff Size: Normal)   Pulse 62   Temp 97.7 F (36.5 C) (Oral)   Ht 5\' 6"  (1.676 m)   Wt 176 lb 6.4 oz (80 kg)   SpO2 95%   BMI 28.47 kg/m   Subsequent Medicare wellness visit  1. Risk factors based on Past M, S, F history: cardiovascular risk factors include hypertension, and hypercholesterolemia.  He has a history of peripheral vascular disease and is status post left CEA 2. Physical Activities: remains fairly active physically, although limited by partial blindness  3. Depression/mood: history of depression, which has been stable. No current treatment  4. Hearing: no deficits  5. ADL's: independent in all aspects of daily living  6. Fall Risk: moderate due to visual deficits  7. Home Safety: no problems identified  8. Height, weight, &visual acuity:height and weight stable. Light perception only involving the right eye;  followed closely by ophthalmology due to glaucoma 9. Counseling: heart healthy diet regular. Exercise encouraged  10. Labs ordered based on risk factors: laboratory profile, including lipid panel be reviewed  11. Referral Coordination- will follow-up  with ophthalmology and urology  12. Care Plan- heart healthy diet regular exercise. All encouraged modest weight loss encouraged  13. Cognitive Assessment- alert and oriented normal affect. No history of memory dysfunction, handles all executive functioning  14.  Preventive services will include annual clinical exams with screening lab.  Annual ophthalmology evaluations.  Encouraged. 15.  Provider list includes primary care and ophthalmology, infectious disease and urology as well as podiatry     Review of Systems  Constitutional: Negative for appetite change, chills, fatigue and fever.  HENT: Negative for congestion, dental problem, ear pain, hearing loss, sore throat, tinnitus, trouble swallowing and voice change.  Eyes: Positive for visual disturbance. Negative for pain and discharge.  Respiratory: Negative for cough, chest tightness, wheezing and stridor.   Cardiovascular: Negative for chest pain, palpitations and leg swelling.  Gastrointestinal: Negative for abdominal distention, abdominal pain, blood in stool, constipation, diarrhea, nausea and vomiting.  Genitourinary: Negative for difficulty urinating, discharge, flank pain, genital sores, hematuria and urgency.  Musculoskeletal: Negative for arthralgias, back pain, gait problem, joint swelling, myalgias and neck stiffness.  Skin: Negative for rash.  Neurological: Negative for dizziness, syncope, speech difficulty, weakness, numbness and headaches.  Hematological: Negative for adenopathy. Does not bruise/bleed easily.  Psychiatric/Behavioral: Negative for behavioral problems and dysphoric mood. The patient is not nervous/anxious.        Objective:   Physical Exam  Constitutional: He appears well-developed and well-nourished.  HENT:  Head: Normocephalic and atraumatic.  Right Ear: External ear normal.  Left Ear: External ear normal.  Nose: Nose normal.  Mouth/Throat: Oropharynx is clear and moist.  Blind right  eye Dentures in place  Eyes: Conjunctivae and EOM are normal. Pupils are equal, round, and reactive to light. No scleral icterus.  Neck: Normal range of motion. Neck supple. No JVD present. No thyromegaly present.  Status post left CEA Bruits noted in carotid and supraclavicular distributions  Cardiovascular: Regular rhythm, normal heart sounds and intact distal pulses. Exam reveals no gallop and no friction rub.  No murmur heard. Grade 0-3/0 systolic murmur heard diffusely and into the carotid distribution  Posterior tibial pulses full.  Dorsalis pedis pulses not easily palpable  Pulmonary/Chest: Effort normal and breath sounds normal. He exhibits no tenderness.  Abdominal: Soft. Bowel sounds are normal. He exhibits no distension and no mass. There is no tenderness.  Ventral hernia Bilateral femoral bruits  Genitourinary: Penis normal.  Genitourinary Comments: Uncircumcised  Musculoskeletal: Normal range of motion. He exhibits edema. He exhibits no tenderness.  Right foot greater than left  Lymphadenopathy:    He has no cervical adenopathy.  Neurological: He is alert. He has normal reflexes. No cranial nerve deficit. Coordination normal.  Skin: Skin is warm and dry. No rash noted.  Psychiatric: He has a normal mood and affect. His behavior is normal.          Assessment & Plan:   Essential hypertension stable Dyslipidemia.  Will review a lipid profile Carotid artery disease History of prostate cancer.  Follow-up urology Glaucoma follow-up ophthalmology  Medical regimen reviewed Flu vaccine administered Follow-up 6 months  Korrin Waterfield Pilar Plate

## 2016-11-30 NOTE — Addendum Note (Signed)
Addended by: Abelardo Diesel on: 11/30/2016 04:18 PM   Modules accepted: Miquel Dunn

## 2016-12-01 ENCOUNTER — Telehealth: Payer: Self-pay | Admitting: Internal Medicine

## 2016-12-01 NOTE — Telephone Encounter (Signed)
Copied from [place] [id number]. Topic: Inquiry >> Dec 01, 2016 12:49 PM Patrice Paradise wrote: Reason for CRM: Patient daughter is requesting a call back from Chari Manning, Dr. Burnice Logan assistant. It's concerning the prescription levothyroxine (SYNTHROID, LEVOTHROID) 50 MCG tablet for her father.

## 2016-12-04 NOTE — Telephone Encounter (Signed)
Called and spoke with [place] pt's daughter in regards to pt's TSH results.

## 2016-12-06 ENCOUNTER — Ambulatory Visit: Payer: Medicare Other | Admitting: Internal Medicine

## 2016-12-12 ENCOUNTER — Other Ambulatory Visit: Payer: Self-pay | Admitting: Internal Medicine

## 2016-12-13 DIAGNOSIS — C61 Malignant neoplasm of prostate: Secondary | ICD-10-CM | POA: Diagnosis not present

## 2016-12-18 DIAGNOSIS — T85698A Other mechanical complication of other specified internal prosthetic devices, implants and grafts, initial encounter: Secondary | ICD-10-CM | POA: Insufficient documentation

## 2016-12-18 DIAGNOSIS — H401123 Primary open-angle glaucoma, left eye, severe stage: Secondary | ICD-10-CM | POA: Diagnosis not present

## 2016-12-18 DIAGNOSIS — H44511 Absolute glaucoma, right eye: Secondary | ICD-10-CM | POA: Diagnosis not present

## 2016-12-19 DIAGNOSIS — R351 Nocturia: Secondary | ICD-10-CM | POA: Diagnosis not present

## 2016-12-19 DIAGNOSIS — N401 Enlarged prostate with lower urinary tract symptoms: Secondary | ICD-10-CM | POA: Diagnosis not present

## 2016-12-19 DIAGNOSIS — C61 Malignant neoplasm of prostate: Secondary | ICD-10-CM | POA: Diagnosis not present

## 2016-12-24 ENCOUNTER — Other Ambulatory Visit: Payer: Self-pay | Admitting: Internal Medicine

## 2017-01-18 DIAGNOSIS — H353221 Exudative age-related macular degeneration, left eye, with active choroidal neovascularization: Secondary | ICD-10-CM | POA: Diagnosis not present

## 2017-01-18 DIAGNOSIS — H35352 Cystoid macular degeneration, left eye: Secondary | ICD-10-CM | POA: Diagnosis not present

## 2017-01-30 DIAGNOSIS — Z9842 Cataract extraction status, left eye: Secondary | ICD-10-CM | POA: Diagnosis not present

## 2017-01-30 DIAGNOSIS — Z9841 Cataract extraction status, right eye: Secondary | ICD-10-CM | POA: Diagnosis not present

## 2017-01-30 DIAGNOSIS — Z91013 Allergy to seafood: Secondary | ICD-10-CM | POA: Diagnosis not present

## 2017-01-30 DIAGNOSIS — T85391A Other mechanical complication of prosthetic orbit of left eye, initial encounter: Secondary | ICD-10-CM | POA: Diagnosis not present

## 2017-01-30 DIAGNOSIS — N179 Acute kidney failure, unspecified: Secondary | ICD-10-CM | POA: Diagnosis not present

## 2017-01-30 DIAGNOSIS — N189 Chronic kidney disease, unspecified: Secondary | ICD-10-CM | POA: Diagnosis not present

## 2017-01-30 DIAGNOSIS — R011 Cardiac murmur, unspecified: Secondary | ICD-10-CM | POA: Diagnosis not present

## 2017-01-30 DIAGNOSIS — T85898A Other specified complication of other internal prosthetic devices, implants and grafts, initial encounter: Secondary | ICD-10-CM | POA: Diagnosis not present

## 2017-01-30 DIAGNOSIS — Z79899 Other long term (current) drug therapy: Secondary | ICD-10-CM | POA: Diagnosis not present

## 2017-01-30 DIAGNOSIS — Z888 Allergy status to other drugs, medicaments and biological substances status: Secondary | ICD-10-CM | POA: Diagnosis not present

## 2017-01-30 DIAGNOSIS — H5440 Blindness, one eye, unspecified eye: Secondary | ICD-10-CM | POA: Diagnosis not present

## 2017-01-30 DIAGNOSIS — Z8546 Personal history of malignant neoplasm of prostate: Secondary | ICD-10-CM | POA: Diagnosis not present

## 2017-01-30 DIAGNOSIS — H3530 Unspecified macular degeneration: Secondary | ICD-10-CM | POA: Diagnosis not present

## 2017-01-30 DIAGNOSIS — Z7951 Long term (current) use of inhaled steroids: Secondary | ICD-10-CM | POA: Diagnosis not present

## 2017-01-30 DIAGNOSIS — N400 Enlarged prostate without lower urinary tract symptoms: Secondary | ICD-10-CM | POA: Diagnosis not present

## 2017-01-30 DIAGNOSIS — E785 Hyperlipidemia, unspecified: Secondary | ICD-10-CM | POA: Diagnosis not present

## 2017-01-30 DIAGNOSIS — Z961 Presence of intraocular lens: Secondary | ICD-10-CM | POA: Diagnosis not present

## 2017-01-30 DIAGNOSIS — E039 Hypothyroidism, unspecified: Secondary | ICD-10-CM | POA: Diagnosis not present

## 2017-01-30 DIAGNOSIS — Z8673 Personal history of transient ischemic attack (TIA), and cerebral infarction without residual deficits: Secondary | ICD-10-CM | POA: Diagnosis not present

## 2017-01-30 DIAGNOSIS — Z87891 Personal history of nicotine dependence: Secondary | ICD-10-CM | POA: Diagnosis not present

## 2017-01-30 DIAGNOSIS — Z7982 Long term (current) use of aspirin: Secondary | ICD-10-CM | POA: Diagnosis not present

## 2017-01-30 DIAGNOSIS — I129 Hypertensive chronic kidney disease with stage 1 through stage 4 chronic kidney disease, or unspecified chronic kidney disease: Secondary | ICD-10-CM | POA: Diagnosis not present

## 2017-01-30 DIAGNOSIS — E7800 Pure hypercholesterolemia, unspecified: Secondary | ICD-10-CM | POA: Diagnosis not present

## 2017-01-30 DIAGNOSIS — H401113 Primary open-angle glaucoma, right eye, severe stage: Secondary | ICD-10-CM | POA: Diagnosis not present

## 2017-01-30 DIAGNOSIS — F329 Major depressive disorder, single episode, unspecified: Secondary | ICD-10-CM | POA: Diagnosis not present

## 2017-01-31 DIAGNOSIS — T85898A Other specified complication of other internal prosthetic devices, implants and grafts, initial encounter: Secondary | ICD-10-CM | POA: Diagnosis not present

## 2017-01-31 DIAGNOSIS — E785 Hyperlipidemia, unspecified: Secondary | ICD-10-CM | POA: Diagnosis not present

## 2017-01-31 DIAGNOSIS — H3530 Unspecified macular degeneration: Secondary | ICD-10-CM | POA: Diagnosis not present

## 2017-01-31 DIAGNOSIS — E039 Hypothyroidism, unspecified: Secondary | ICD-10-CM | POA: Diagnosis not present

## 2017-01-31 DIAGNOSIS — H5440 Blindness, one eye, unspecified eye: Secondary | ICD-10-CM | POA: Diagnosis not present

## 2017-01-31 DIAGNOSIS — H401113 Primary open-angle glaucoma, right eye, severe stage: Secondary | ICD-10-CM | POA: Diagnosis not present

## 2017-02-06 DIAGNOSIS — B351 Tinea unguium: Secondary | ICD-10-CM | POA: Diagnosis not present

## 2017-02-06 DIAGNOSIS — L03031 Cellulitis of right toe: Secondary | ICD-10-CM | POA: Diagnosis not present

## 2017-02-06 DIAGNOSIS — L03032 Cellulitis of left toe: Secondary | ICD-10-CM | POA: Diagnosis not present

## 2017-02-16 ENCOUNTER — Encounter: Payer: Self-pay | Admitting: Gastroenterology

## 2017-02-17 ENCOUNTER — Other Ambulatory Visit: Payer: Self-pay | Admitting: Internal Medicine

## 2017-03-19 ENCOUNTER — Other Ambulatory Visit: Payer: Self-pay | Admitting: Internal Medicine

## 2017-03-22 DIAGNOSIS — H353221 Exudative age-related macular degeneration, left eye, with active choroidal neovascularization: Secondary | ICD-10-CM | POA: Diagnosis not present

## 2017-04-17 DIAGNOSIS — L03031 Cellulitis of right toe: Secondary | ICD-10-CM | POA: Diagnosis not present

## 2017-04-17 DIAGNOSIS — L03032 Cellulitis of left toe: Secondary | ICD-10-CM | POA: Diagnosis not present

## 2017-04-17 DIAGNOSIS — B351 Tinea unguium: Secondary | ICD-10-CM | POA: Diagnosis not present

## 2017-05-09 ENCOUNTER — Telehealth: Payer: Self-pay | Admitting: Internal Medicine

## 2017-05-09 NOTE — Telephone Encounter (Signed)
Copied from [place] [id number]. Topic: Quick Communication - See Telephone Encounter >> May 09, 2017  1:05 PM Synthia Innocent wrote: CRM for notification. See Telephone encounter for: 05/09/17. Patient daughter calling requesting home health in home for eval for PT, back pain. [place]

## 2017-05-10 NOTE — Telephone Encounter (Signed)
Spoke to patient daughter and her main concern is her dads back issues. Patient daughter stated that if his back was okay he would be able to be self sufficient. Patient daughter is asking for [facility] for help.

## 2017-05-10 NOTE — Telephone Encounter (Signed)
Left message to return call 

## 2017-05-11 ENCOUNTER — Other Ambulatory Visit: Payer: Self-pay

## 2017-05-11 DIAGNOSIS — Z0189 Encounter for other specified special examinations: Secondary | ICD-10-CM

## 2017-05-11 DIAGNOSIS — Z7689 Persons encountering health services in other specified circumstances: Secondary | ICD-10-CM

## 2017-05-11 NOTE — Telephone Encounter (Signed)
Okay for [facility] consult

## 2017-05-14 ENCOUNTER — Other Ambulatory Visit: Payer: Self-pay

## 2017-05-14 DIAGNOSIS — G8929 Other chronic pain: Secondary | ICD-10-CM

## 2017-05-14 DIAGNOSIS — M549 Dorsalgia, unspecified: Principal | ICD-10-CM

## 2017-05-17 ENCOUNTER — Other Ambulatory Visit: Payer: Self-pay | Admitting: Internal Medicine

## 2017-05-22 NOTE — Telephone Encounter (Signed)
Pt's daughter calling checking on the referral. She is wanting him sent to [facility]. He is home bound and he is blind, needing physical therapy for back issues.

## 2017-05-22 NOTE — Telephone Encounter (Signed)
Referral was sent to [place] but pt lives in [place]... Alexander Mcdonald can you check on this please? Thanks!

## 2017-05-24 DIAGNOSIS — H353221 Exudative age-related macular degeneration, left eye, with active choroidal neovascularization: Secondary | ICD-10-CM | POA: Diagnosis not present

## 2017-06-01 ENCOUNTER — Telehealth: Payer: Self-pay | Admitting: Internal Medicine

## 2017-06-01 NOTE — Telephone Encounter (Signed)
Copied from [place] [phone]. Topic: Quick Communication - See Telephone Encounter >> May 09, 2017  1:05 PM Synthia Innocent wrote: CRM for notification. See Telephone encounter for: 05/09/17. Patient daughter calling requesting home health in home for eval for PT, back pain. [place] >> Jun 01, 2017  9:14 AM Cleaster Corin, NT wrote: Pt. Daughter calling to speak with practice administrator Rachel Bo about fathers home health care. Stated that no one haas updated her on anything and she would like to speak with someone today.  Pt. Daughter Zidan Helget can be reached at [phone] leave vm)

## 2017-06-01 NOTE — Telephone Encounter (Signed)
STAT referral to [facility] has been placed and message sent to [facility] to advise of urgency.

## 2017-06-05 NOTE — Telephone Encounter (Signed)
Jun 01, 2017  STAT referral to [facility] has been placed and message sent to [facility] to advise of urgency.  Per [place] @ [place]: Insurance requires a F2F visit within the last 90 days for [facility] orders. This pt was last seen 11/2016. We'd love to see this pt once she has had a new office visit.   Spoke with pt and he has been scheduled to see Dr.K tomorrow.

## 2017-06-06 ENCOUNTER — Encounter: Payer: Self-pay | Admitting: Internal Medicine

## 2017-06-06 ENCOUNTER — Ambulatory Visit (INDEPENDENT_AMBULATORY_CARE_PROVIDER_SITE_OTHER): Payer: Medicare Other | Admitting: Internal Medicine

## 2017-06-06 VITALS — BP 160/70 | HR 63 | Temp 97.8°F | Wt 167.0 lb

## 2017-06-06 DIAGNOSIS — I10 Essential (primary) hypertension: Secondary | ICD-10-CM | POA: Diagnosis not present

## 2017-06-06 DIAGNOSIS — E7800 Pure hypercholesterolemia, unspecified: Secondary | ICD-10-CM | POA: Diagnosis not present

## 2017-06-06 DIAGNOSIS — N183 Chronic kidney disease, stage 3 unspecified: Secondary | ICD-10-CM

## 2017-06-06 DIAGNOSIS — E039 Hypothyroidism, unspecified: Secondary | ICD-10-CM | POA: Diagnosis not present

## 2017-06-06 LAB — BASIC METABOLIC PANEL
BUN: 35 mg/dL — ABNORMAL HIGH (ref 6–23)
CO2: 27 mEq/L (ref 19–32)
Calcium: 9.6 mg/dL (ref 8.4–10.5)
Chloride: 103 mEq/L (ref 96–112)
Creatinine, Ser: 2.16 mg/dL — ABNORMAL HIGH (ref 0.40–1.50)
GFR: 30.75 mL/min — ABNORMAL LOW (ref >60.00)
Glucose, Bld: 100 mg/dL — ABNORMAL HIGH (ref 70–99)
Potassium: 5.1 mEq/L (ref 3.5–5.1)
Sodium: 137 mEq/L (ref 135–145)

## 2017-06-06 LAB — TSH: TSH: 3.22 u[IU]/mL (ref 0.35–4.50)

## 2017-06-06 NOTE — Progress Notes (Signed)
Subjective:    Patient ID: Alexander Mcdonald, male    DOB: [birth year], 84 y.o.   MRN: [id number]  HPI  84 year old patient who is seen today for a face-to-face evaluation for consideration of advanced home care referral.  The patient is homebound due to unsteady gait and partial blindness.  He has essential hypertension and dyslipidemia.  He has advanced chronic kidney disease with a GFR of 30. His family is requesting advanced home care PT and OT to assist with general strengthening, minimize fall risk and assisting with transfers and aspects of daily living  Past Medical History:  Diagnosis Date  . ACUTE RENAL FAILURE W/LESION OF TUBULAR NECROSIS 01/04/2007  . Bladder spasm 06/04/2014  . BPH (benign prostatic hyperplasia) 06/04/2014  . CAROTID ARTERY DISEASE 06/01/2009  . DEPRESSION 07/25/2006  . DIVERTICULOSIS OF COLON 02/01/2007  . Glaucoma   . GOUT 07/25/2006  . HEMORRHOID, THROMBOSED 02/15/2009  . HYPERCHOLESTEROLEMIA 07/25/2006  . HYPERTENSION 07/25/2006  . PROSTATE CANCER, HX OF 07/25/2006  . Rosacea 09/28/2008  . WEIGHT LOSS 09/01/2009     Social History   Socioeconomic History  . Marital status: Married    Spouse name: Not on file  . Number of children: Not on file  . Years of education: Not on file  . Highest education level: Not on file  Occupational History  . Not on file  Social Needs  . Financial resource strain: Not on file  . Food insecurity:    Worry: Not on file    Inability: Not on file  . Transportation needs:    Medical: Not on file    Non-medical: Not on file  Tobacco Use  . Smoking status: Former Smoker    Last attempt to quit: 01/17/1988    Years since quitting: 29.4  . Smokeless tobacco: Never Used  Substance and Sexual Activity  . Alcohol use: Yes    Alcohol/week: 3.5 oz    Types: 7 drink(s) per week    Comment: beer  . Drug use: No  . Sexual activity: Never  Lifestyle  . Physical activity:    Days per week: Not on file    Minutes per session: Not on  file  . Stress: Not on file  Relationships  . Social connections:    Talks on phone: Not on file    Gets together: Not on file    Attends religious service: Not on file    Active member of club or organization: Not on file    Attends meetings of clubs or organizations: Not on file    Relationship status: Not on file  . Intimate partner violence:    Fear of current or ex partner: Not on file    Emotionally abused: Not on file    Physically abused: Not on file    Forced sexual activity: Not on file  Other Topics Concern  . Not on file  Social History Narrative  . Not on file    Past Surgical History:  Procedure Laterality Date  . CAROTID ENDARTERECTOMY    . CATARACT EXTRACTION      History reviewed. No pertinent family history.  Allergies  Allergen Reactions  . Shellfish Allergy Anaphylaxis    Pt is allergic to mussels   . Brimonidine Itching    Itching and swelling red  . Dorzolamide Hcl-Timolol Mal Other (See Comments)    Eye itch and redness  . Timolol Itching    Itching rash under eyes.    Current Outpatient  Medications on File Prior to Visit  Medication Sig Dispense Refill  . amLODipine (NORVASC) 2.5 MG tablet TAKE 1 TABLET BY MOUTH EVERY DAY 90 tablet 0  . aspirin 325 MG tablet Take 325 mg by mouth.    Marland Kitchen atorvastatin (LIPITOR) 10 MG tablet TAKE 1 TABLET(10 MG) BY MOUTH DAILY 90 tablet 0  . AZOPT 1 % ophthalmic suspension Place 1 drop into the left eye 2 (two) times daily.   11  . fish oil-omega-3 fatty acids 1000 MG capsule Take 1 g by mouth daily.    . fluticasone (FLONASE) 50 MCG/ACT nasal spray Place 2 sprays into both nostrils daily. 16 g 6  . fosinopril (MONOPRIL) 20 MG tablet TAKE 1 TABLET BY MOUTH EVERY DAY 90 tablet 0  . levocetirizine (XYZAL) 5 MG tablet Take 1 tablet (5 mg total) by mouth every evening. 90 tablet 0  . levothyroxine (SYNTHROID, LEVOTHROID) 50 MCG tablet Take 1 tablet (50 mcg total) daily by mouth. 90 tablet 3  . LUMIGAN 0.01 % SOLN  Place 1 drop into the left eye at bedtime.     . Meth-Hyo-M Bl-Na Phos-Ph Sal (URIBEL) 118 MG CAPS Take 1 capsule by mouth as needed.     Marland Kitchen MYRBETRIQ 25 MG TB24 tablet TAKE 1 TABLET BY MOUTH DAILY 90 tablet 1  . NONFORMULARY OR COMPOUNDED ITEM [facility]: Peripheral Neuropathy Cream - Bupivacaine 1%, Doxepin 3%, Gabapentin 6%, Pentoxifylline 3%, Topiramate 1%, apply 1-2 grams to affected area 3-4 times daily. 120 each 2  . tamsulosin (FLOMAX) 0.4 MG CAPS capsule TAKE ONE CAPSULE BY MOUTH EVERY DAY 90 capsule 3  . Travoprost, BAK Free, (TRAVATAN Z) 0.004 % SOLN ophthalmic solution Place 1 drop into the left eye every evening.     No current facility-administered medications on file prior to visit.     BP (!) 160/70 (BP Location: Left Arm, Patient Position: Sitting, Cuff Size: Normal)   Pulse 63   Temp 97.8 F (36.6 C) (Oral)   Wt 167 lb (75.8 kg)   SpO2 96%   BMI 26.95 kg/m     Review of Systems  Constitutional: Positive for activity change and fatigue.  Musculoskeletal: Positive for arthralgias, back pain and gait problem.  Neurological: Positive for weakness.       Objective:   Physical Exam  Constitutional: He is oriented to person, place, and time. He appears well-developed.  HENT:  Head: Normocephalic.  Right Ear: External ear normal.  Left Ear: External ear normal.  Eyes: Conjunctivae and EOM are normal.  Neck: Normal range of motion.  Bilateral bruits  Cardiovascular: Normal rate.  Murmur heard. Pulmonary/Chest: Breath sounds normal.  Abdominal: Bowel sounds are normal.  Musculoskeletal: Normal range of motion. He exhibits edema. He exhibits no tenderness.  Trace pedal edema  Neurological: He is alert and oriented to person, place, and time.  Psychiatric: He has a normal mood and affect. His behavior is normal.          Assessment & Plan:   Dependence and aspects of daily living.  We will set up for advanced home care PT and OT for general  strengthening and gait training Hypertension General debility Blind right eye Hypothyroidism.  Will review a TSH 6 months ago TSH was slightly elevated Chronic kidney disease.  We will recheck renal indices  Alexander Mcdonald

## 2017-06-06 NOTE — Patient Instructions (Signed)
Home physical therapy as discussed  Limit your sodium (Salt) intake  Return in 6 months for follow-up

## 2017-06-12 ENCOUNTER — Other Ambulatory Visit: Payer: Self-pay | Admitting: Internal Medicine

## 2017-06-12 DIAGNOSIS — M6281 Muscle weakness (generalized): Secondary | ICD-10-CM | POA: Diagnosis not present

## 2017-06-12 DIAGNOSIS — I129 Hypertensive chronic kidney disease with stage 1 through stage 4 chronic kidney disease, or unspecified chronic kidney disease: Secondary | ICD-10-CM | POA: Diagnosis not present

## 2017-06-12 DIAGNOSIS — N183 Chronic kidney disease, stage 3 (moderate): Secondary | ICD-10-CM | POA: Diagnosis not present

## 2017-06-12 DIAGNOSIS — H5410 Blindness, one eye, low vision other eye, unspecified eyes: Secondary | ICD-10-CM | POA: Diagnosis not present

## 2017-06-12 DIAGNOSIS — R261 Paralytic gait: Secondary | ICD-10-CM | POA: Diagnosis not present

## 2017-06-13 ENCOUNTER — Telehealth: Payer: Self-pay | Admitting: Internal Medicine

## 2017-06-13 NOTE — Telephone Encounter (Signed)
Okay for verbal 

## 2017-06-13 NOTE — Telephone Encounter (Signed)
Copied from [place] [id number]. Topic: General - Other >> Jun 13, 2017  4:26 PM Valla Leaver wrote: Reason for CRM: Irena Reichmann, RN with [facility] calling to get orders for verbal approval for her start of care for skilled nursing, PT and OT.

## 2017-06-15 ENCOUNTER — Telehealth: Payer: Self-pay | Admitting: Internal Medicine

## 2017-06-15 DIAGNOSIS — N183 Chronic kidney disease, stage 3 (moderate): Secondary | ICD-10-CM | POA: Diagnosis not present

## 2017-06-15 DIAGNOSIS — H5410 Blindness, one eye, low vision other eye, unspecified eyes: Secondary | ICD-10-CM | POA: Diagnosis not present

## 2017-06-15 DIAGNOSIS — R261 Paralytic gait: Secondary | ICD-10-CM | POA: Diagnosis not present

## 2017-06-15 DIAGNOSIS — I129 Hypertensive chronic kidney disease with stage 1 through stage 4 chronic kidney disease, or unspecified chronic kidney disease: Secondary | ICD-10-CM | POA: Diagnosis not present

## 2017-06-15 DIAGNOSIS — M6281 Muscle weakness (generalized): Secondary | ICD-10-CM | POA: Diagnosis not present

## 2017-06-15 NOTE — Telephone Encounter (Signed)
Copied from [place] [phone]. Topic: Quick Communication - See Telephone Encounter >> Jun 15, 2017  1:49 PM Boyd Kerbs wrote: CRM for notification. See Telephone encounter for: 06/15/17.  Laurey Arrow with [facility] [phone]  needing verbal orders for PT 2 x 2; 1 x 2 ,   OT 1 x 3

## 2017-06-15 NOTE — Telephone Encounter (Signed)
Okay for verbal orders? Please advise 

## 2017-06-15 NOTE — Telephone Encounter (Signed)
ok 

## 2017-06-15 NOTE — Telephone Encounter (Signed)
Verbal orders given to [facility].

## 2017-06-17 ENCOUNTER — Other Ambulatory Visit: Payer: Self-pay | Admitting: Internal Medicine

## 2017-06-18 NOTE — Telephone Encounter (Signed)
Yes okay for verbal orders

## 2017-06-18 NOTE — Telephone Encounter (Signed)
Left message to return phone call.

## 2017-06-19 DIAGNOSIS — R261 Paralytic gait: Secondary | ICD-10-CM | POA: Diagnosis not present

## 2017-06-19 DIAGNOSIS — N183 Chronic kidney disease, stage 3 (moderate): Secondary | ICD-10-CM | POA: Diagnosis not present

## 2017-06-19 DIAGNOSIS — M6281 Muscle weakness (generalized): Secondary | ICD-10-CM | POA: Diagnosis not present

## 2017-06-19 DIAGNOSIS — H5410 Blindness, one eye, low vision other eye, unspecified eyes: Secondary | ICD-10-CM | POA: Diagnosis not present

## 2017-06-19 DIAGNOSIS — I129 Hypertensive chronic kidney disease with stage 1 through stage 4 chronic kidney disease, or unspecified chronic kidney disease: Secondary | ICD-10-CM | POA: Diagnosis not present

## 2017-06-21 DIAGNOSIS — I129 Hypertensive chronic kidney disease with stage 1 through stage 4 chronic kidney disease, or unspecified chronic kidney disease: Secondary | ICD-10-CM | POA: Diagnosis not present

## 2017-06-21 DIAGNOSIS — R261 Paralytic gait: Secondary | ICD-10-CM | POA: Diagnosis not present

## 2017-06-21 DIAGNOSIS — H5410 Blindness, one eye, low vision other eye, unspecified eyes: Secondary | ICD-10-CM | POA: Diagnosis not present

## 2017-06-21 DIAGNOSIS — N183 Chronic kidney disease, stage 3 (moderate): Secondary | ICD-10-CM | POA: Diagnosis not present

## 2017-06-21 DIAGNOSIS — M6281 Muscle weakness (generalized): Secondary | ICD-10-CM | POA: Diagnosis not present

## 2017-06-22 NOTE — Telephone Encounter (Signed)
Verbal orders given to [name] 

## 2017-06-29 ENCOUNTER — Telehealth: Payer: Self-pay | Admitting: Internal Medicine

## 2017-06-29 ENCOUNTER — Other Ambulatory Visit: Payer: Self-pay | Admitting: Family Medicine

## 2017-06-29 ENCOUNTER — Ambulatory Visit (INDEPENDENT_AMBULATORY_CARE_PROVIDER_SITE_OTHER)
Admission: RE | Admit: 2017-06-29 | Discharge: 2017-06-29 | Disposition: A | Discharge status: Home / Self Care | Payer: Medicare Other | Source: Ambulatory Visit | Attending: Family Medicine | Admitting: Family Medicine

## 2017-06-29 ENCOUNTER — Encounter: Payer: Self-pay | Admitting: Family Medicine

## 2017-06-29 ENCOUNTER — Ambulatory Visit: Payer: Medicare Other | Admitting: Family Medicine

## 2017-06-29 ENCOUNTER — Ambulatory Visit (INDEPENDENT_AMBULATORY_CARE_PROVIDER_SITE_OTHER): Payer: Medicare Other | Admitting: Family Medicine

## 2017-06-29 VITALS — BP 102/58 | HR 92 | Wt 163.0 lb

## 2017-06-29 DIAGNOSIS — J181 Lobar pneumonia, unspecified organism: Principal | ICD-10-CM

## 2017-06-29 DIAGNOSIS — J069 Acute upper respiratory infection, unspecified: Secondary | ICD-10-CM

## 2017-06-29 DIAGNOSIS — R05 Cough: Secondary | ICD-10-CM | POA: Diagnosis not present

## 2017-06-29 DIAGNOSIS — I959 Hypotension, unspecified: Secondary | ICD-10-CM

## 2017-06-29 DIAGNOSIS — J189 Pneumonia, unspecified organism: Secondary | ICD-10-CM

## 2017-06-29 LAB — BASIC METABOLIC PANEL
BUN: 41 mg/dL — ABNORMAL HIGH (ref 6–23)
CO2: 24 mEq/L (ref 19–32)
Calcium: 9.1 mg/dL (ref 8.4–10.5)
Chloride: 99 mEq/L (ref 96–112)
Creatinine, Ser: 2.73 mg/dL — ABNORMAL HIGH (ref 0.40–1.50)
GFR: 23.47 mL/min — ABNORMAL LOW (ref >60.00)
Glucose, Bld: 110 mg/dL — ABNORMAL HIGH (ref 70–99)
Potassium: 4.7 mEq/L (ref 3.5–5.1)
Sodium: 134 mEq/L — ABNORMAL LOW (ref 135–145)

## 2017-06-29 LAB — CBC
HCT: 44.1 % (ref 39.0–52.0)
Hemoglobin: 15.2 g/dL (ref 13.0–17.0)
MCHC: 34.4 g/dL (ref 30.0–36.0)
MCV: 88.1 fl (ref 78.0–100.0)
Platelets: 176 10*3/uL (ref 150.0–400.0)
RBC: 5.01 Mil/uL (ref 4.22–5.81)
RDW: 15 % (ref 11.5–15.5)
WBC: 8.6 10*3/uL (ref 4.0–10.5)

## 2017-06-29 MED ORDER — AZITHROMYCIN 250 MG PO TABS: ORAL_TABLET | ORAL | 0 refills | Status: DC

## 2017-06-29 NOTE — Telephone Encounter (Signed)
Pt given results and recommendations per notes of Dr. Volanda Napoleon on 06/29/17. Pt verbalized understanding.Unable to document in result note due to result note not being routed to [facility].

## 2017-06-29 NOTE — Progress Notes (Signed)
Subjective:    Patient ID: Alexander Mcdonald, male    DOB: Mar 14, 1926, 84 y.o.   MRN: [id number]  No chief complaint on file.   HPI Patient was seen today for acute visit.  Pt is accompanied by his daughters. Pt with productive cough, decreased appetite, rhinorrhea, raw throat, loose stools, and decreased energy x several days.  Pt's daughter gave him ibuprofen for his symptoms.  Pt has been sleeping more.  Pt denies dysuria, HA, N/V.  Sick contacts include one of pt's daughter's who is his caregiver--she states she was sick for weeks with similar symptoms.  Pt endorses allergy hx.  Using flonase, but not blowing his nose first.  Past Medical History:  Diagnosis Date  . ACUTE RENAL FAILURE W/LESION OF TUBULAR NECROSIS 01/04/2007  . Bladder spasm 06/04/2014  . BPH (benign prostatic hyperplasia) 06/04/2014  . CAROTID ARTERY DISEASE 06/01/2009  . DEPRESSION 07/25/2006  . DIVERTICULOSIS OF COLON 02/01/2007  . Glaucoma   . GOUT 07/25/2006  . HEMORRHOID, THROMBOSED 02/15/2009  . HYPERCHOLESTEROLEMIA 07/25/2006  . HYPERTENSION 07/25/2006  . PROSTATE CANCER, HX OF 07/25/2006  . Rosacea 09/28/2008  . WEIGHT LOSS 09/01/2009    Allergies  Allergen Reactions  . Shellfish Allergy Anaphylaxis    Pt is allergic to mussels   . Brimonidine Itching    Itching and swelling red  . Dorzolamide Hcl-Timolol Mal Other (See Comments)    Eye itch and redness  . Timolol Itching    Itching rash under eyes.    ROS General: Denies fever, chills, night sweats, changes in weight, changes in appetite HEENT: Denies headaches, ear pain, changes in vision    +rhinorrhea, sore throat, congestion CV: Denies CP, palpitations, SOB, orthopnea Pulm: Denies SOB, wheezing  +cough GI: Denies abdominal pain, nausea, vomiting, constipation  + loose stools GU: Denies dysuria, hematuria, frequency, vaginal discharge Msk: Denies muscle cramps, joint pains Neuro: Denies weakness, numbness, tingling Skin: Denies rashes, bruising Psych:  Denies depression, anxiety, hallucinations     Objective:    Blood pressure (!) 102/58, pulse 92, weight 163 lb (73.9 kg), SpO2 94 %.   Gen. Pleasant, well-nourished, in no distress, normal affect   HEENT: [place]/AT, face symmetric, no scleral icterus, PERRLA, nares patent with clear drainage, pharynx with mild erythema, no exudate.  TMs normal bilaterally.  No cervical lymphadenopathy. Lungs: no accessory muscle use, faint wheeze in upper lobes. Cardiovascular: RRR, no m/r/g, no peripheral edema Abdomen: BS present, soft, NT/ND Neuro:  A&Ox3, CN II-XII intact, slowed gait   Wt Readings from Last 3 Encounters:  06/29/17 163 lb (73.9 kg)  06/06/17 167 lb (75.8 kg)  11/30/16 176 lb 6.4 oz (80 kg)    Lab Results  Component Value Date   WBC 8.0 11/30/2016   HGB 14.5 11/30/2016   HCT 43.5 11/30/2016   PLT 196.0 11/30/2016   GLUCOSE 100 (H) 06/06/2017   CHOL 181 11/30/2016   TRIG 166.0 (H) 11/30/2016   HDL 48.00 11/30/2016   LDLDIRECT 90 04/10/2013   LDLCALC 100 (H) 11/30/2016   ALT 17 11/30/2016   AST 15 11/30/2016   NA 137 06/06/2017   K 5.1 06/06/2017   CL 103 06/06/2017   CREATININE 2.16 (H) 06/06/2017   BUN 35 (H) 06/06/2017   CO2 27 06/06/2017   TSH 3.22 06/06/2017   PSA 5.24 (H) 07/12/2009    Assessment/Plan:  URI with cough and congestion - Plan: DG Chest 2 View  Hypotension, unspecified hypotension type -Today 102/58.  Typically 140s-160s/60s-70s -Possibly  2/2 dehydration vs infection -We will obtain labs and chest x-ray -Pt encouraged to increase p.o. intake of fluids -Family to check patient's BP at home, if continues to be low less than 120s/ 60s we will hold blood pressure medication at this time - Plan: CBC (no diff), Basic metabolic panel -given RTC or ED precautions  Grier Mitts, MD

## 2017-07-03 ENCOUNTER — Encounter: Payer: Self-pay | Admitting: Internal Medicine

## 2017-07-03 ENCOUNTER — Ambulatory Visit (INDEPENDENT_AMBULATORY_CARE_PROVIDER_SITE_OTHER): Payer: Medicare Other | Admitting: Internal Medicine

## 2017-07-03 ENCOUNTER — Ambulatory Visit: Payer: Self-pay | Admitting: *Deleted

## 2017-07-03 VITALS — BP 110/58 | HR 77 | Temp 97.9°F | Wt 161.0 lb

## 2017-07-03 DIAGNOSIS — I10 Essential (primary) hypertension: Secondary | ICD-10-CM

## 2017-07-03 MED ORDER — LEVOFLOXACIN 500 MG PO TABS: 500.0000 mg | ORAL_TABLET | Freq: Every day | ORAL | 0 refills | Status: DC

## 2017-07-03 NOTE — Progress Notes (Signed)
Subjective:    Patient ID: Alexander Mcdonald, male    DOB: [birth year], 84 y.o.   MRN: [id number]  HPI  84 year old patient who was seen 4 days ago with a chief complaint of cough and congestion. A chest x-ray was obtained that suggested a left lower lobe infiltrate.  Patient has completed azithromycin and is seen today in follow-up. The patient feels about the same.  Still has some chest congestion malaise.  Denies any fever or chills.  Oxygen saturation 95%.  He is afebrile CBC 5 days ago revealed a normal white blood cell count.  Past Medical History:  Diagnosis Date  . ACUTE RENAL FAILURE W/LESION OF TUBULAR NECROSIS 01/04/2007  . Bladder spasm 06/04/2014  . BPH (benign prostatic hyperplasia) 06/04/2014  . CAROTID ARTERY DISEASE 06/01/2009  . DEPRESSION 07/25/2006  . DIVERTICULOSIS OF COLON 02/01/2007  . Glaucoma   . GOUT 07/25/2006  . HEMORRHOID, THROMBOSED 02/15/2009  . HYPERCHOLESTEROLEMIA 07/25/2006  . HYPERTENSION 07/25/2006  . PROSTATE CANCER, HX OF 07/25/2006  . Rosacea 09/28/2008  . WEIGHT LOSS 09/01/2009     Social History   Socioeconomic History  . Marital status: Married    Spouse name: Not on file  . Number of children: Not on file  . Years of education: Not on file  . Highest education level: Not on file  Occupational History  . Not on file  Social Needs  . Financial resource strain: Not on file  . Food insecurity:    Worry: Not on file    Inability: Not on file  . Transportation needs:    Medical: Not on file    Non-medical: Not on file  Tobacco Use  . Smoking status: Former Smoker    Last attempt to quit: 01/17/1988    Years since quitting: 29.4  . Smokeless tobacco: Never Used  Substance and Sexual Activity  . Alcohol use: Yes    Alcohol/week: 4.2 oz    Types: 7 drink(s) per week    Comment: beer  . Drug use: No  . Sexual activity: Never  Lifestyle  . Physical activity:    Days per week: Not on file    Minutes per session: Not on file  . Stress: Not on  file  Relationships  . Social connections:    Talks on phone: Not on file    Gets together: Not on file    Attends religious service: Not on file    Active member of club or organization: Not on file    Attends meetings of clubs or organizations: Not on file    Relationship status: Not on file  . Intimate partner violence:    Fear of current or ex partner: Not on file    Emotionally abused: Not on file    Physically abused: Not on file    Forced sexual activity: Not on file  Other Topics Concern  . Not on file  Social History Narrative  . Not on file    Past Surgical History:  Procedure Laterality Date  . CAROTID ENDARTERECTOMY    . CATARACT EXTRACTION      History reviewed. No pertinent family history.  Allergies  Allergen Reactions  . Shellfish Allergy Anaphylaxis    Pt is allergic to mussels   . Brimonidine Itching    Itching and swelling red  . Dorzolamide Hcl-Timolol Mal Other (See Comments)    Eye itch and redness  . Timolol Itching    Itching rash under eyes.  Current Outpatient Medications on File Prior to Visit  Medication Sig Dispense Refill  . amLODipine (NORVASC) 2.5 MG tablet TAKE 1 TABLET BY MOUTH EVERY DAY 90 tablet 0  . aspirin 325 MG tablet Take 325 mg by mouth.    Marland Kitchen atorvastatin (LIPITOR) 10 MG tablet TAKE 1 TABLET(10 MG) BY MOUTH DAILY 90 tablet 0  . AZOPT 1 % ophthalmic suspension Place 1 drop into the left eye 2 (two) times daily.   11  . fish oil-omega-3 fatty acids 1000 MG capsule Take 1 g by mouth daily.    . fluticasone (FLONASE) 50 MCG/ACT nasal spray Place 2 sprays into both nostrils daily. 16 g 6  . fosinopril (MONOPRIL) 20 MG tablet TAKE 1 TABLET BY MOUTH EVERY DAY 90 tablet 1  . levocetirizine (XYZAL) 5 MG tablet Take 1 tablet (5 mg total) by mouth every evening. 90 tablet 0  . levothyroxine (SYNTHROID, LEVOTHROID) 50 MCG tablet Take 1 tablet (50 mcg total) daily by mouth. 90 tablet 3  . LUMIGAN 0.01 % SOLN Place 1 drop into the left  eye at bedtime.     . Meth-Hyo-M Bl-Na Phos-Ph Sal (URIBEL) 118 MG CAPS Take 1 capsule by mouth as needed.     Marland Kitchen MYRBETRIQ 25 MG TB24 tablet TAKE 1 TABLET BY MOUTH DAILY 90 tablet 1  . NONFORMULARY OR COMPOUNDED ITEM [facility]: Peripheral Neuropathy Cream - Bupivacaine 1%, Doxepin 3%, Gabapentin 6%, Pentoxifylline 3%, Topiramate 1%, apply 1-2 grams to affected area 3-4 times daily. 120 each 2  . tamsulosin (FLOMAX) 0.4 MG CAPS capsule TAKE ONE CAPSULE BY MOUTH EVERY DAY 90 capsule 3  . Travoprost, BAK Free, (TRAVATAN Z) 0.004 % SOLN ophthalmic solution Place 1 drop into the left eye every evening.     No current facility-administered medications on file prior to visit.     BP (!) 110/58 (BP Location: Right Arm, Patient Position: Sitting, Cuff Size: Normal)   Pulse 77   Temp 97.9 F (36.6 C) (Oral)   Wt 161 lb (73 kg)   SpO2 95%   BMI 25.99 kg/m     Review of Systems  Constitutional: Positive for activity change, appetite change and fatigue. Negative for chills and fever.  HENT: Positive for congestion and rhinorrhea. Negative for dental problem, ear pain, hearing loss, sore throat, tinnitus, trouble swallowing and voice change.   Eyes: Negative for pain, discharge and visual disturbance.  Respiratory: Positive for cough. Negative for chest tightness, wheezing and stridor.   Cardiovascular: Negative for chest pain, palpitations and leg swelling.  Gastrointestinal: Negative for abdominal distention, abdominal pain, blood in stool, constipation, diarrhea, nausea and vomiting.  Genitourinary: Negative for difficulty urinating, discharge, flank pain, genital sores, hematuria and urgency.  Musculoskeletal: Negative for arthralgias, back pain, gait problem, joint swelling, myalgias and neck stiffness.  Skin: Negative for rash.  Neurological: Negative for dizziness, syncope, speech difficulty, weakness, numbness and headaches.  Hematological: Negative for adenopathy. Does not  bruise/bleed easily.  Psychiatric/Behavioral: Negative for behavioral problems and dysphoric mood. The patient is not nervous/anxious.        Objective:   Physical Exam  Constitutional: He is oriented to person, place, and time. He appears well-developed.  Afebrile Appears unwell but in no distress No tachycardia  HENT:  Head: Normocephalic.  Right Ear: External ear normal.  Left Ear: External ear normal.  Eyes: Conjunctivae and EOM are normal.  Neck: Normal range of motion.  Cardiovascular: Normal rate and normal heart sounds.  Pulmonary/Chest: Breath sounds  normal.  Considerable rales involving the left lower lung fields No increased work of breathing O2 saturation 95%  Abdominal: Bowel sounds are normal.  Musculoskeletal: Normal range of motion. He exhibits no edema or tenderness.  Neurological: He is alert and oriented to person, place, and time.  Psychiatric: He has a normal mood and affect. His behavior is normal.          Assessment & Plan:   Left lower lobe community-acquired pneumonia.  The patient is clinically unchanged but congestion involving the left lower lung field seems more prominent compared to his prior exam.  Due to possibility of macrolide resistance, lack of clinical improvement and worsening pulmonary rales, will retreat with Levaquin 500 mg daily for 7 days  The patient will report any clinical worsening He was told to report to the ED if he develops any shortness of breath fever or worsening in his status  Marletta Lor

## 2017-07-03 NOTE — Patient Instructions (Addendum)
Drink as much fluid as you  can tolerate over the next few days  Take your antibiotic as prescribed until ALL of it is gone, but stop if you develop a rash, swelling, or any side effects of the medication.  Contact our office as soon as possible if  there are side effects of the medication.  Return in 1 week for follow-up  Hydrate and Humidify  Drink enough water to keep your urine clear or pale yellow. Staying hydrated will help to thin your mucus.  Use a cool mist humidifier to keep the humidity level in your home above 50%.  Inhale steam for 10-15 minutes, 3-4 times a day or as told by your health care provider. You can do this in the bathroom while a hot shower is running.  Limit your exposure to cool or dry air. Rest  Rest as much as possible.

## 2017-07-03 NOTE — Telephone Encounter (Signed)
Spoke with [facility], appt scheduled for today at 2:30 with Dr Raliegh Ip. Daughter requested that lab results be discussed with patient at this visit as well.  Will send to [facility] as FYI.

## 2017-07-03 NOTE — Telephone Encounter (Signed)
Noted  

## 2017-07-03 NOTE — Telephone Encounter (Signed)
Patient's daughter is calling to report that her father is not getting better with the antibiotic treatment. He finished the treatment today- he seemed to get better at the beginning- but he is sleeping a lot and still coughs with a lot of mucus in his chest. She is requesting a follow up appointment with PCP- she wants to be sure he is getting better and the cough is improving.   Reason for Disposition . Requesting regular office appointment  Protocols used: INFORMATION ONLY CALL-A-AH

## 2017-07-04 ENCOUNTER — Telehealth: Payer: Self-pay | Admitting: Internal Medicine

## 2017-07-04 NOTE — Telephone Encounter (Signed)
Dr. Raliegh Ip - [facility]. Thanks!

## 2017-07-04 NOTE — Telephone Encounter (Signed)
Copied from [id number]. Topic: General - Other >> Jul 04, 2017  4:37 PM Cecelia Byars, NT wrote: Reason for CRM [facility] called to inform the practice the patient is refusing occupational service    [phone]

## 2017-07-05 ENCOUNTER — Encounter (HOSPITAL_COMMUNITY): Payer: Self-pay

## 2017-07-05 ENCOUNTER — Other Ambulatory Visit: Payer: Self-pay

## 2017-07-05 ENCOUNTER — Inpatient Hospital Stay (HOSPITAL_COMMUNITY)
Admission: EM | Admit: 2017-07-05 | Discharge: 2017-07-14 | DRG: 811 | Disposition: A | Discharge status: Home Health | Payer: Medicare Other | Attending: Internal Medicine | Admitting: Internal Medicine

## 2017-07-05 ENCOUNTER — Emergency Department (HOSPITAL_COMMUNITY): Payer: Medicare Other

## 2017-07-05 DIAGNOSIS — I10 Essential (primary) hypertension: Secondary | ICD-10-CM | POA: Diagnosis not present

## 2017-07-05 DIAGNOSIS — I959 Hypotension, unspecified: Secondary | ICD-10-CM | POA: Diagnosis not present

## 2017-07-05 DIAGNOSIS — D62 Acute posthemorrhagic anemia: Secondary | ICD-10-CM

## 2017-07-05 DIAGNOSIS — K228 Other specified diseases of esophagus: Secondary | ICD-10-CM | POA: Diagnosis present

## 2017-07-05 DIAGNOSIS — R000 Tachycardia, unspecified: Secondary | ICD-10-CM | POA: Diagnosis not present

## 2017-07-05 DIAGNOSIS — Z79890 Hormone replacement therapy (postmenopausal): Secondary | ICD-10-CM

## 2017-07-05 DIAGNOSIS — Z8546 Personal history of malignant neoplasm of prostate: Secondary | ICD-10-CM

## 2017-07-05 DIAGNOSIS — E871 Hypo-osmolality and hyponatremia: Secondary | ICD-10-CM | POA: Diagnosis not present

## 2017-07-05 DIAGNOSIS — N189 Chronic kidney disease, unspecified: Secondary | ICD-10-CM | POA: Diagnosis present

## 2017-07-05 DIAGNOSIS — N179 Acute kidney failure, unspecified: Secondary | ICD-10-CM | POA: Diagnosis present

## 2017-07-05 DIAGNOSIS — Z87891 Personal history of nicotine dependence: Secondary | ICD-10-CM

## 2017-07-05 DIAGNOSIS — I951 Orthostatic hypotension: Secondary | ICD-10-CM | POA: Diagnosis present

## 2017-07-05 DIAGNOSIS — K264 Chronic or unspecified duodenal ulcer with hemorrhage: Secondary | ICD-10-CM | POA: Diagnosis present

## 2017-07-05 DIAGNOSIS — K315 Obstruction of duodenum: Secondary | ICD-10-CM

## 2017-07-05 DIAGNOSIS — I129 Hypertensive chronic kidney disease with stage 1 through stage 4 chronic kidney disease, or unspecified chronic kidney disease: Secondary | ICD-10-CM | POA: Diagnosis present

## 2017-07-05 DIAGNOSIS — J189 Pneumonia, unspecified organism: Secondary | ICD-10-CM | POA: Diagnosis present

## 2017-07-05 DIAGNOSIS — R112 Nausea with vomiting, unspecified: Secondary | ICD-10-CM | POA: Diagnosis present

## 2017-07-05 DIAGNOSIS — Z8701 Personal history of pneumonia (recurrent): Secondary | ICD-10-CM

## 2017-07-05 DIAGNOSIS — E860 Dehydration: Secondary | ICD-10-CM | POA: Diagnosis present

## 2017-07-05 DIAGNOSIS — Z7951 Long term (current) use of inhaled steroids: Secondary | ICD-10-CM

## 2017-07-05 DIAGNOSIS — K922 Gastrointestinal hemorrhage, unspecified: Secondary | ICD-10-CM

## 2017-07-05 DIAGNOSIS — J209 Acute bronchitis, unspecified: Secondary | ICD-10-CM | POA: Diagnosis present

## 2017-07-05 DIAGNOSIS — Z79899 Other long term (current) drug therapy: Secondary | ICD-10-CM

## 2017-07-05 DIAGNOSIS — E785 Hyperlipidemia, unspecified: Secondary | ICD-10-CM | POA: Diagnosis present

## 2017-07-05 DIAGNOSIS — K2210 Ulcer of esophagus without bleeding: Secondary | ICD-10-CM

## 2017-07-05 DIAGNOSIS — R05 Cough: Secondary | ICD-10-CM | POA: Diagnosis not present

## 2017-07-05 DIAGNOSIS — N400 Enlarged prostate without lower urinary tract symptoms: Secondary | ICD-10-CM | POA: Diagnosis present

## 2017-07-05 DIAGNOSIS — Z91013 Allergy to seafood: Secondary | ICD-10-CM

## 2017-07-05 DIAGNOSIS — Z7982 Long term (current) use of aspirin: Secondary | ICD-10-CM

## 2017-07-05 DIAGNOSIS — R0602 Shortness of breath: Secondary | ICD-10-CM

## 2017-07-05 DIAGNOSIS — E039 Hypothyroidism, unspecified: Secondary | ICD-10-CM | POA: Diagnosis present

## 2017-07-05 DIAGNOSIS — K5730 Diverticulosis of large intestine without perforation or abscess without bleeding: Secondary | ICD-10-CM | POA: Diagnosis present

## 2017-07-05 DIAGNOSIS — H409 Unspecified glaucoma: Secondary | ICD-10-CM | POA: Diagnosis present

## 2017-07-05 DIAGNOSIS — R531 Weakness: Secondary | ICD-10-CM | POA: Diagnosis not present

## 2017-07-05 DIAGNOSIS — R0902 Hypoxemia: Secondary | ICD-10-CM | POA: Diagnosis not present

## 2017-07-05 DIAGNOSIS — Z9849 Cataract extraction status, unspecified eye: Secondary | ICD-10-CM

## 2017-07-05 DIAGNOSIS — K921 Melena: Secondary | ICD-10-CM | POA: Diagnosis present

## 2017-07-05 DIAGNOSIS — Z888 Allergy status to other drugs, medicaments and biological substances status: Secondary | ICD-10-CM

## 2017-07-05 DIAGNOSIS — N183 Chronic kidney disease, stage 3 (moderate): Secondary | ICD-10-CM | POA: Diagnosis present

## 2017-07-05 DIAGNOSIS — E7800 Pure hypercholesterolemia, unspecified: Secondary | ICD-10-CM | POA: Diagnosis present

## 2017-07-05 LAB — COMPREHENSIVE METABOLIC PANEL
ALT: 43 U/L (ref 17–63)
AST: 43 U/L — ABNORMAL HIGH (ref 15–41)
Albumin: 4.1 g/dL (ref 3.5–5.0)
Alkaline Phosphatase: 82 U/L (ref 38–126)
Anion gap: 17 — ABNORMAL HIGH (ref 5–15)
BUN: 32 mg/dL — ABNORMAL HIGH (ref 6–20)
CO2: 17 mmol/L — ABNORMAL LOW (ref 22–32)
Calcium: 8.9 mg/dL (ref 8.9–10.3)
Chloride: 88 mmol/L — ABNORMAL LOW (ref 101–111)
Creatinine, Ser: 2.38 mg/dL — ABNORMAL HIGH (ref 0.61–1.24)
GFR calc Af Amer: 26 mL/min — ABNORMAL LOW (ref >60)
GFR calc non Af Amer: 23 mL/min — ABNORMAL LOW (ref >60)
Glucose, Bld: 98 mg/dL (ref 65–99)
Potassium: 3.9 mmol/L (ref 3.5–5.1)
Sodium: 122 mmol/L — ABNORMAL LOW (ref 135–145)
Total Bilirubin: 2.1 mg/dL — ABNORMAL HIGH (ref 0.3–1.2)
Total Protein: 7.5 g/dL (ref 6.5–8.1)

## 2017-07-05 LAB — CBC WITH DIFFERENTIAL/PLATELET
Basophils Absolute: 0 10*3/uL (ref 0.0–0.1)
Basophils Relative: 0 %
Eosinophils Absolute: 0.1 10*3/uL (ref 0.0–0.7)
Eosinophils Relative: 1 %
HCT: 39.2 % (ref 39.0–52.0)
Hemoglobin: 14.5 g/dL (ref 13.0–17.0)
Lymphocytes Relative: 21 %
Lymphs Abs: 2 10*3/uL (ref 0.7–4.0)
MCH: 30.5 pg (ref 26.0–34.0)
MCHC: 37 g/dL — ABNORMAL HIGH (ref 30.0–36.0)
MCV: 82.4 fL (ref 78.0–100.0)
Monocytes Absolute: 0.6 10*3/uL (ref 0.1–1.0)
Monocytes Relative: 6 %
Neutro Abs: 7 10*3/uL (ref 1.7–7.7)
Neutrophils Relative %: 72 %
Platelets: 216 10*3/uL (ref 150–400)
RBC: 4.76 MIL/uL (ref 4.22–5.81)
RDW: 13.5 % (ref 11.5–15.5)
WBC: 9.6 10*3/uL (ref 4.0–10.5)

## 2017-07-05 LAB — BASIC METABOLIC PANEL
Anion gap: 16 — ABNORMAL HIGH (ref 5–15)
Anion gap: 14 (ref 5–15)
BUN: 34 mg/dL — ABNORMAL HIGH (ref 6–20)
BUN: 34 mg/dL — ABNORMAL HIGH (ref 6–20)
CO2: 19 mmol/L — ABNORMAL LOW (ref 22–32)
CO2: 20 mmol/L — ABNORMAL LOW (ref 22–32)
Calcium: 8.7 mg/dL — ABNORMAL LOW (ref 8.9–10.3)
Calcium: 8.7 mg/dL — ABNORMAL LOW (ref 8.9–10.3)
Chloride: 88 mmol/L — ABNORMAL LOW (ref 101–111)
Chloride: 90 mmol/L — ABNORMAL LOW (ref 101–111)
Creatinine, Ser: 2.54 mg/dL — ABNORMAL HIGH (ref 0.61–1.24)
Creatinine, Ser: 2.48 mg/dL — ABNORMAL HIGH (ref 0.61–1.24)
GFR calc Af Amer: 24 mL/min — ABNORMAL LOW (ref >60)
GFR calc Af Amer: 25 mL/min — ABNORMAL LOW (ref >60)
GFR calc non Af Amer: 21 mL/min — ABNORMAL LOW (ref >60)
GFR calc non Af Amer: 22 mL/min — ABNORMAL LOW (ref >60)
Glucose, Bld: 105 mg/dL — ABNORMAL HIGH (ref 65–99)
Glucose, Bld: 132 mg/dL — ABNORMAL HIGH (ref 65–99)
Potassium: 3.7 mmol/L (ref 3.5–5.1)
Potassium: 4.4 mmol/L (ref 3.5–5.1)
Sodium: 123 mmol/L — ABNORMAL LOW (ref 135–145)
Sodium: 124 mmol/L — ABNORMAL LOW (ref 135–145)

## 2017-07-05 LAB — URINALYSIS, ROUTINE W REFLEX MICROSCOPIC
Bilirubin Urine: NEGATIVE
Glucose, UA: NEGATIVE mg/dL
Ketones, ur: 5 mg/dL — AB
Nitrite: NEGATIVE
Protein, ur: NEGATIVE mg/dL
Specific Gravity, Urine: 1.014 (ref 1.005–1.030)
pH: 5 (ref 5.0–8.0)

## 2017-07-05 LAB — TSH: TSH: 2.351 u[IU]/mL (ref 0.350–4.500)

## 2017-07-05 LAB — I-STAT CG4 LACTIC ACID, ED: Lactic Acid, Venous: 1.7 mmol/L (ref 0.5–1.9)

## 2017-07-05 LAB — TROPONIN I: Troponin I: <0.03 ng/mL (ref <0.03)

## 2017-07-05 MED ORDER — AMLODIPINE BESYLATE 5 MG PO TABS
2.5000 mg | ORAL_TABLET | Freq: Every day | ORAL | Status: DC
  Administered 2017-07-06 – 2017-07-08 (×3): 2.5 mg via ORAL
  Filled 2017-07-05 (×4): qty 1

## 2017-07-05 MED ORDER — ATROPINE SULFATE 1 % OP SOLN
1.0000 [drp] | Freq: Every day | OPHTHALMIC | Status: DC
  Administered 2017-07-05 – 2017-07-13 (×9): 1 [drp] via OPHTHALMIC
  Filled 2017-07-05: qty 2

## 2017-07-05 MED ORDER — PREDNISONE 50 MG PO TABS
50.0000 mg | ORAL_TABLET | Freq: Every day | ORAL | Status: AC
  Administered 2017-07-05 – 2017-07-09 (×5): 50 mg via ORAL
  Filled 2017-07-05 (×6): qty 1

## 2017-07-05 MED ORDER — BRINZOLAMIDE 1 % OP SUSP
1.0000 [drp] | Freq: Two times a day (BID) | OPHTHALMIC | Status: DC
  Administered 2017-07-05 – 2017-07-14 (×18): 1 [drp] via OPHTHALMIC
  Filled 2017-07-05: qty 10

## 2017-07-05 MED ORDER — SODIUM CHLORIDE 0.9 % IV SOLN
Freq: Once | INTRAVENOUS | Status: AC
  Administered 2017-07-05: 16:00:00 via INTRAVENOUS

## 2017-07-05 MED ORDER — SODIUM CHLORIDE 0.9 % IV SOLN
INTRAVENOUS | Status: DC
  Administered 2017-07-05 – 2017-07-12 (×9): via INTRAVENOUS

## 2017-07-05 MED ORDER — ENOXAPARIN SODIUM 30 MG/0.3ML ~~LOC~~ SOLN
30.0000 mg | SUBCUTANEOUS | Status: DC
  Administered 2017-07-05 – 2017-07-08 (×4): 30 mg via SUBCUTANEOUS
  Filled 2017-07-05 (×4): qty 0.3

## 2017-07-05 MED ORDER — ATORVASTATIN CALCIUM 10 MG PO TABS
10.0000 mg | ORAL_TABLET | Freq: Every day | ORAL | Status: DC
  Administered 2017-07-06 – 2017-07-13 (×8): 10 mg via ORAL
  Filled 2017-07-05 (×8): qty 1

## 2017-07-05 MED ORDER — ONDANSETRON HCL 4 MG PO TABS: 4.0000 mg | ORAL_TABLET | Freq: Four times a day (QID) | ORAL | Status: DC | PRN

## 2017-07-05 MED ORDER — ORAL CARE MOUTH RINSE
15.0000 mL | Freq: Two times a day (BID) | OROMUCOSAL | Status: DC
  Administered 2017-07-06 – 2017-07-12 (×8): 15 mL via OROMUCOSAL

## 2017-07-05 MED ORDER — TAMSULOSIN HCL 0.4 MG PO CAPS
0.4000 mg | ORAL_CAPSULE | Freq: Every day | ORAL | Status: DC
  Administered 2017-07-06 – 2017-07-14 (×8): 0.4 mg via ORAL
  Filled 2017-07-05 (×9): qty 1

## 2017-07-05 MED ORDER — ONDANSETRON HCL 4 MG/2ML IJ SOLN
4.0000 mg | Freq: Four times a day (QID) | INTRAMUSCULAR | Status: DC | PRN
  Administered 2017-07-05 – 2017-07-10 (×5): 4 mg via INTRAVENOUS
  Filled 2017-07-05 (×5): qty 2

## 2017-07-05 MED ORDER — LEVOTHYROXINE SODIUM 50 MCG PO TABS
50.0000 ug | ORAL_TABLET | Freq: Every day | ORAL | Status: DC
  Administered 2017-07-06 – 2017-07-14 (×8): 50 ug via ORAL
  Filled 2017-07-05 (×9): qty 1

## 2017-07-05 MED ORDER — LATANOPROST 0.005 % OP SOLN
1.0000 [drp] | Freq: Every day | OPHTHALMIC | Status: DC
  Administered 2017-07-05 – 2017-07-13 (×9): 1 [drp] via OPHTHALMIC
  Filled 2017-07-05: qty 2.5

## 2017-07-05 MED ORDER — CHLORHEXIDINE GLUCONATE 0.12 % MT SOLN
15.0000 mL | Freq: Two times a day (BID) | OROMUCOSAL | Status: DC
  Administered 2017-07-05 – 2017-07-14 (×18): 15 mL via OROMUCOSAL
  Filled 2017-07-05 (×16): qty 15

## 2017-07-05 MED ORDER — ASPIRIN EC 81 MG PO TBEC
81.0000 mg | DELAYED_RELEASE_TABLET | Freq: Every day | ORAL | Status: DC
  Administered 2017-07-06 – 2017-07-09 (×4): 81 mg via ORAL
  Filled 2017-07-05 (×5): qty 1

## 2017-07-05 MED ORDER — ACETAMINOPHEN 325 MG PO TABS
650.0000 mg | ORAL_TABLET | Freq: Four times a day (QID) | ORAL | Status: DC | PRN
  Administered 2017-07-10: 650 mg via ORAL
  Filled 2017-07-05: qty 2

## 2017-07-05 MED ORDER — ACETAMINOPHEN 650 MG RE SUPP: 650.0000 mg | Freq: Four times a day (QID) | RECTAL | Status: DC | PRN

## 2017-07-05 NOTE — ED Notes (Signed)
Bed: CS91 Expected date:  Expected time:  Means of arrival:  Comments: [id number]-PNEUMO

## 2017-07-05 NOTE — Care Management Note (Signed)
Case Management Note  Patient Details  Name: Alexander Mcdonald MRN: [id number] Date of Birth: 31-Jan-1926  CM noted pt was active with [facility].  Updated Sharyn Lull with [facility] who advised pt was active with [facility] RN, PT, OT.  Expected Discharge Date:    Unknown              Expected Discharge Plan:  [place]  Post Acute Care Choice:  Home Health Choice offered to:  Patient  HH Arranged:  RN, OT, PT [facility] Agency:  [facility]  Status of Service:  In process, will continue to follow  Rae Mar, RN 07/05/2017, 2:51 PM

## 2017-07-05 NOTE — ED Notes (Signed)
ED TO INPATIENT HANDOFF REPORT  Name/Age/Gender Alexander Mcdonald 84 y.o. male  Code Status Code Status History    Date Active Date Inactive Code Status Order ID Comments User Context   12/25/2011 0636 01/02/2012 2057 Full Code [id number]  Domingo Dimes, RN Inpatient      Home/SNF/Other Home  Chief Complaint Pneumonia  Level of Care/Admitting Diagnosis ED Disposition    ED Disposition Condition [place]: [facility] [[id number]]  Level of Care: Med-Surg [16]  Diagnosis: Hyponatremia [270350]  Admitting Physician: Dessa Phi [[id number]]  Attending Physician: Dessa Phi [phone]  PT Class (Do Not Modify): Observation [104]  PT Acc Code (Do Not Modify): Observation [10022]       Medical History Past Medical History:  Diagnosis Date  . ACUTE RENAL FAILURE W/LESION OF TUBULAR NECROSIS 01/04/2007  . Bladder spasm 06/04/2014  . BPH (benign prostatic hyperplasia) 06/04/2014  . CAROTID ARTERY DISEASE 06/01/2009  . DEPRESSION 07/25/2006  . DIVERTICULOSIS OF COLON 02/01/2007  . Glaucoma   . GOUT 07/25/2006  . HEMORRHOID, THROMBOSED 02/15/2009  . HYPERCHOLESTEROLEMIA 07/25/2006  . HYPERTENSION 07/25/2006  . PROSTATE CANCER, HX OF 07/25/2006  . Rosacea 09/28/2008  . WEIGHT LOSS 09/01/2009    Allergies Allergies  Allergen Reactions  . Shellfish Allergy Anaphylaxis    Pt is allergic to mussels   . Brimonidine Itching    Itching and swelling red  . Dorzolamide Hcl-Timolol Mal Other (See Comments)    Eye itch and redness  . Timolol Itching    Itching rash under eyes.    IV Location/Drains/Wounds Patient Lines/Drains/Airways Status   Active Line/Drains/Airways    Name:   Placement date:   Placement time:   Site:   Days:   Peripheral IV 07/05/17 Left Forearm   07/05/17    1217    Forearm   less than 1          Labs/Imaging Results for orders placed or performed during the hospital encounter of 07/05/17 (from the past 48 hour(s))   Comprehensive metabolic panel     Status: Abnormal   Collection Time: 07/05/17 12:16 PM  Result Value Ref Range   Sodium 122 (L) 135 - 145 mmol/L   Potassium 3.9 3.5 - 5.1 mmol/L   Chloride 88 (L) 101 - 111 mmol/L   CO2 17 (L) 22 - 32 mmol/L   Glucose, Bld 98 65 - 99 mg/dL   BUN 32 (H) 6 - 20 mg/dL   Creatinine, Ser 2.38 (H) 0.61 - 1.24 mg/dL   Calcium 8.9 8.9 - 10.3 mg/dL   Total Protein 7.5 6.5 - 8.1 g/dL   Albumin 4.1 3.5 - 5.0 g/dL   AST 43 (H) 15 - 41 U/L   ALT 43 17 - 63 U/L   Alkaline Phosphatase 82 38 - 126 U/L   Total Bilirubin 2.1 (H) 0.3 - 1.2 mg/dL   GFR calc non Af Amer 23 (L) >60 mL/min   GFR calc Af Amer 26 (L) >60 mL/min    Comment: (NOTE) The eGFR has been calculated using the CKD EPI equation. This calculation has not been validated in all clinical situations. eGFR's persistently <60 mL/min signify possible Chronic Kidney Disease.    Anion gap 17 (H) 5 - 15    Comment: Performed at [facility], [place] [address], [place], [place] [address]  CBC with Differential     Status: Abnormal   Collection Time: 07/05/17 12:16 PM  Result Value  Ref Range   WBC 9.6 4.0 - 10.5 K/uL   RBC 4.76 4.22 - 5.81 MIL/uL   Hemoglobin 14.5 13.0 - 17.0 g/dL   HCT 39.2 39.0 - 52.0 %   MCV 82.4 78.0 - 100.0 fL   MCH 30.5 26.0 - 34.0 pg   MCHC 37.0 (H) 30.0 - 36.0 g/dL   RDW 13.5 11.5 - 15.5 %   Platelets 216 150 - 400 K/uL   Neutrophils Relative % 72 %   Neutro Abs 7.0 1.7 - 7.7 K/uL   Lymphocytes Relative 21 %   Lymphs Abs 2.0 0.7 - 4.0 K/uL   Monocytes Relative 6 %   Monocytes Absolute 0.6 0.1 - 1.0 K/uL   Eosinophils Relative 1 %   Eosinophils Absolute 0.1 0.0 - 0.7 K/uL   Basophils Relative 0 %   Basophils Absolute 0.0 0.0 - 0.1 K/uL    Comment: Performed at [facility], [place] [address], [place], [place] [address]  Troponin I     Status: None   Collection Time: 07/05/17 12:16 PM  Result Value Ref Range   Troponin I <0.03 <0.03 ng/mL     Comment: Performed at [facility], [place] [address], [place], [place] [address]  I-Stat CG4 Lactic Acid, ED     Status: None   Collection Time: 07/05/17 12:20 PM  Result Value Ref Range   Lactic Acid, Venous 1.70 0.5 - 1.9 mmol/L   Dg Chest 2 View  Result Date: 07/05/2017 CLINICAL DATA:  Shortness of breath EXAM: CHEST - 2 VIEW COMPARISON:  06/29/2017 FINDINGS: Borderline heart size. Negative aortic and hilar contours. There is no edema, air bronchogram, effusion, or pneumothorax. Syndesmophytes with multilevel thoracic ankylosis. No acute osseous finding. IMPRESSION: 1. No convincing pneumonia or other acute disease today. 2. Findings of ankylosing spondylitis in the thoracic spine. Electronically Signed   By: Monte Fantasia M.D.   On: 07/05/2017 13:38    Pending Labs Unresulted Labs (From admission, onward)   Start     Ordered   07/05/17 1216  Urinalysis, Routine w reflex microscopic  STAT,   STAT     07/05/17 1216   Signed and Held  TSH  Once,   R     Signed and Held   Signed and Held  CBC  Tomorrow morning,   R     Signed and Held   Signed and Held  Basic metabolic panel  Now then every 4 hours,   R     Signed and Held      Vitals/Pain Today's Vitals   07/05/17 1300 07/05/17 1436 07/05/17 1500 07/05/17 1530  BP: 102/69 (!) 146/61 (!) 123/96 (!) 134/54  Pulse: 91 83 82 76  Resp: 19 (!) 21 (!) 25 (!) 23  Temp:      TempSrc:      SpO2: 94% 95% 93% 94%  Weight:      Height:        Isolation Precautions No active isolations  Medications Medications  0.9 %  sodium chloride infusion ( Intravenous New Bag/Given 07/05/17 1530)    Mobility walks with person assist

## 2017-07-05 NOTE — ED Notes (Signed)
ED Provider at bedside. 

## 2017-07-05 NOTE — ED Provider Notes (Signed)
[facility] DEPT Provider Note   CSN: [id number] Arrival date & time: 07/05/17  1149     History   Chief Complaint Chief Complaint  Patient presents with  . Shortness of Breath    HPI Alexander Mcdonald is a 84 y.o. male.  HPI  84 year old male presents with shortness of breath and concern for pneumonia.  History taken from patient and daughter.  For about a week he is been having cough with congestion and shortness of breath.  Shortness of breath is especially noted when walking.  He is also been getting weaker and weaker and not eating much.  He is drinking fluids.  No swelling in his abdomen or legs.  He was initially treated with azithromycin but not improved and so has been started on Levaquin.  These are the only new medicines.  No fevers.  Past Medical History:  Diagnosis Date  . ACUTE RENAL FAILURE W/LESION OF TUBULAR NECROSIS 01/04/2007  . Bladder spasm 06/04/2014  . BPH (benign prostatic hyperplasia) 06/04/2014  . CAROTID ARTERY DISEASE 06/01/2009  . DEPRESSION 07/25/2006  . DIVERTICULOSIS OF COLON 02/01/2007  . Glaucoma   . GOUT 07/25/2006  . HEMORRHOID, THROMBOSED 02/15/2009  . HYPERCHOLESTEROLEMIA 07/25/2006  . HYPERTENSION 07/25/2006  . PROSTATE CANCER, HX OF 07/25/2006  . Rosacea 09/28/2008  . WEIGHT LOSS 09/01/2009    Patient Active Problem List   Diagnosis Date Noted  . Hyponatremia 07/05/2017  . Hypothyroidism 07/05/2017  . Recurrent UTI 04/10/2013  . Renal failure (ARF), acute on chronic (HCC) 12/25/2011  . Chronic kidney disease 09/12/2011  . CAROTID ARTERY DISEASE 06/01/2009  . HEMORRHOID, THROMBOSED 02/15/2009  . ROSACEA 09/28/2008  . DIVERTICULOSIS OF COLON 02/01/2007  . HYPERCHOLESTEROLEMIA 07/25/2006  . GOUT 07/25/2006  . DEPRESSION 07/25/2006  . Essential hypertension 07/25/2006  . PROSTATE CANCER, HX OF 07/25/2006    Past Surgical History:  Procedure Laterality Date  . CAROTID ENDARTERECTOMY    . CATARACT EXTRACTION            Home Medications    Prior to Admission medications   Medication Sig Start Date End Date Taking? Authorizing Provider  amLODipine (NORVASC) 2.5 MG tablet TAKE 1 TABLET BY MOUTH EVERY DAY 06/18/17  Yes Marletta Lor, MD  aspirin EC 81 MG tablet Take 81 mg by mouth daily.   Yes [provider]  atorvastatin (LIPITOR) 10 MG tablet TAKE 1 TABLET(10 MG) BY MOUTH DAILY 05/17/17  Yes Marletta Lor, MD  atropine 1 % ophthalmic solution Place 1 drop into the right eye at bedtime.  06/26/17  Yes [provider]  AZOPT 1 % ophthalmic suspension Place 1 drop into both eyes 2 (two) times daily.  03/31/14  Yes [provider]  fish oil-omega-3 fatty acids 1000 MG capsule Take 1 g by mouth 2 (two) times daily.    Yes [provider]  fluticasone (FLONASE) 50 MCG/ACT nasal spray Place 2 sprays into both nostrils daily. 08/02/16  Yes Marletta Lor, MD  fosinopril (MONOPRIL) 20 MG tablet TAKE 1 TABLET BY MOUTH EVERY DAY 06/12/17  Yes Marletta Lor, MD  levocetirizine (XYZAL) 5 MG tablet Take 1 tablet (5 mg total) by mouth every evening. 05/11/16  Yes Marletta Lor, MD  levofloxacin (LEVAQUIN) 500 MG tablet Take 1 tablet (500 mg total) by mouth daily. 07/03/17  Yes Marletta Lor, MD  levothyroxine (SYNTHROID, LEVOTHROID) 50 MCG tablet Take 1 tablet (50 mcg total) daily by mouth. 11/30/16  Yes  Marletta Lor, MD  Meth-Hyo-M Bl-Na Phos-Ph Sal (URIBEL) 118 MG CAPS Take 1 capsule by mouth 3 (three) times daily as needed (urinary pain).  06/30/13  Yes [provider]  NONFORMULARY OR COMPOUNDED [facility]: Peripheral Neuropathy Cream - Bupivacaine 1%, Doxepin 3%, Gabapentin 6%, Pentoxifylline 3%, Topiramate 1%, apply 1-2 grams to affected area 3-4 times daily. 07/30/15  Yes Trula Slade, DPM  tamsulosin (FLOMAX) 0.4 MG CAPS capsule TAKE ONE CAPSULE BY MOUTH EVERY DAY 06/20/13  Yes Marletta Lor, MD   Travoprost, BAK Free, (TRAVATAN Z) 0.004 % SOLN ophthalmic solution Place 1 drop into the left eye every evening. 07/03/16  Yes [provider]  MYRBETRIQ 25 MG TB24 tablet TAKE 1 TABLET BY MOUTH DAILY Patient not taking: Reported on 07/05/2017 06/25/14   Marletta Lor, MD    Family History History reviewed. No pertinent family history.  Social History Social History   Tobacco Use  . Smoking status: Former Smoker    Last attempt to quit: 01/17/1988    Years since quitting: 29.4  . Smokeless tobacco: Never Used  Substance Use Topics  . Alcohol use: Yes    Alcohol/week: 4.2 oz    Types: 7 drink(s) per week    Comment: beer  . Drug use: No     Allergies   Shellfish allergy; Brimonidine; Dorzolamide hcl-timolol mal; and Timolol   Review of Systems Review of Systems  Constitutional: Negative for fever.  Respiratory: Positive for cough, shortness of breath and wheezing.   Cardiovascular: Negative for chest pain and leg swelling.  Gastrointestinal: Negative for abdominal distention, abdominal pain and vomiting.  Neurological: Positive for weakness. Negative for headaches.  All other systems reviewed and are negative.    Physical Exam Updated Vital Signs BP (!) 134/54   Pulse 76   Temp 97.8 F (36.6 C) (Oral)   Resp (!) 23   Ht 5\' 5"  (1.651 m)   Wt 73 kg (161 lb)   SpO2 94%   BMI 26.79 kg/m   Physical Exam  Constitutional: He appears well-developed and well-nourished.  Non-toxic appearance. He does not appear ill.  HENT:  Head: Normocephalic and atraumatic.  Right Ear: External ear normal.  Left Ear: External ear normal.  Nose: Nose normal.  Eyes: Right eye exhibits no discharge. Left eye exhibits no discharge.  Neck: Neck supple.  Cardiovascular: Normal rate, regular rhythm and normal heart sounds.  Pulmonary/Chest: Effort normal and breath sounds normal. He has no wheezes. He has no rales.  Abdominal: Soft. There is no tenderness.   Musculoskeletal: He exhibits no edema.  Neurological: He is alert.  Equal strength in all 4 extremities. However he needed assistance to sit up in stretcher, which is atypical for him per daughter  Skin: Skin is warm and dry.  Nursing note and vitals reviewed.    ED Treatments / Results  Labs (all labs ordered are listed, but only abnormal results are displayed) Labs Reviewed  COMPREHENSIVE METABOLIC PANEL - Abnormal; Notable for the following components:      Result Value   Sodium 122 (*)    Chloride 88 (*)    CO2 17 (*)    BUN 32 (*)    Creatinine, Ser 2.38 (*)    AST 43 (*)    Total Bilirubin 2.1 (*)    GFR calc non Af Amer 23 (*)    GFR calc Af Amer 26 (*)    Anion gap 17 (*)    All  other components within normal limits  CBC WITH DIFFERENTIAL/PLATELET - Abnormal; Notable for the following components:   MCHC 37.0 (*)    All other components within normal limits  TROPONIN I  URINALYSIS, ROUTINE W REFLEX MICROSCOPIC  I-STAT CG4 LACTIC ACID, ED    EKG EKG Interpretation  Date/Time:  Thursday July 05 2017 12:03:06 EDT Ventricular Rate:  89 PR Interval:    QRS Duration: 80 QT Interval:  379 QTC Calculation: 462 R Axis:   -28 Text Interpretation:  Sinus rhythm Borderline left axis deviation Anterior infarct, old PVCs no longer present compared to 2013 Confirmed by Sherwood Gambler [phone]) on 07/05/2017 2:44:35 PM   Radiology Dg Chest 2 View  Result Date: 07/05/2017 CLINICAL DATA:  Shortness of breath EXAM: CHEST - 2 VIEW COMPARISON:  06/29/2017 FINDINGS: Borderline heart size. Negative aortic and hilar contours. There is no edema, air bronchogram, effusion, or pneumothorax. Syndesmophytes with multilevel thoracic ankylosis. No acute osseous finding. IMPRESSION: 1. No convincing pneumonia or other acute disease today. 2. Findings of ankylosing spondylitis in the thoracic spine. Electronically Signed   By: Monte Fantasia M.D.   On: 07/05/2017 13:38     Procedures Procedures (including critical care time)  Medications Ordered in ED Medications  0.9 %  sodium chloride infusion ( Intravenous New Bag/Given 07/05/17 1530)     Initial Impression / Assessment and Plan / ED Course  I have reviewed the triage vital signs and the nursing notes.  Pertinent labs & imaging results that were available during my care of the patient were reviewed by me and considered in my medical decision making (see chart for details).     Hyponatremia could be from decreased p.o. intake.  He is drinking fluids but not eating very well.  Thus after discussion with hospitalist, will start on IV normal saline, 75 mL/h.  As for his cough, unclear cause as he does not currently appear to have pneumonia or pulmonary edema.  He does not appear fluid overloaded.  I think the generalized weakness is from the hyponatremia which we he will need treatment and admission for.  No focal deficits.  No respiratory failure.  Final Clinical Impressions(s) / ED Diagnoses   Final diagnoses:  Hyponatremia    ED Discharge Orders    None       Sherwood Gambler, MD 07/05/17 1557

## 2017-07-05 NOTE — H&P (Signed)
History and Physical    Alexander Mcdonald:[id number] DOB: 1926-09-18 DOA: 07/05/2017  PCP: Marletta Lor, MD  Patient coming from: Home  Chief Complaint: Decreased PO intake, vomiting, diarrhea   HPI: Alexander Mcdonald is a 84 y.o. male with medical history significant of hypertension, hyperlipidemia, hypothyroidism who presents with decreased p.o. intake, nausea, vomiting, diarrhea.  He was recently seen by his primary care physician and was diagnosed with pneumonia and was started on azithromycin.  No improvement and was seen again by his primary care physician, was switched to Levaquin at the time.  Daughter at bedside provides most of the history who states that patient has had decreased p.o. intake, nausea, vomiting, diarrhea since antibiotic initiation.  He also has had productive cough of brown sputum as well as wheezing and shortness of breath.  Daughter says she has also had similar upper respiratory issues which lasted about 3 weeks.  He has had no fevers or chills at home, no chest pain or abdominal pain.  Diarrhea is loose and watery in nature but he is again not had any fevers or abdominal pain associated with diarrhea.  ED Course: Labs obtained in the emergency department revealed sodium 122, creatinine 2.38, lactic acid 1.7, WBC 9.6.  Chest x-ray was negative for infiltrate.  He was referred for admission for hyponatremia as well as poor p.o. intake.  Review of Systems: As per HPI otherwise 10 point review of systems negative.   Past Medical History:  Diagnosis Date  . ACUTE RENAL FAILURE W/LESION OF TUBULAR NECROSIS 01/04/2007  . Bladder spasm 06/04/2014  . BPH (benign prostatic hyperplasia) 06/04/2014  . CAROTID ARTERY DISEASE 06/01/2009  . DEPRESSION 07/25/2006  . DIVERTICULOSIS OF COLON 02/01/2007  . Glaucoma   . GOUT 07/25/2006  . HEMORRHOID, THROMBOSED 02/15/2009  . HYPERCHOLESTEROLEMIA 07/25/2006  . HYPERTENSION 07/25/2006  . PROSTATE CANCER, HX OF 07/25/2006  . Rosacea  09/28/2008  . WEIGHT LOSS 09/01/2009    Past Surgical History:  Procedure Laterality Date  . CAROTID ENDARTERECTOMY    . CATARACT EXTRACTION       reports that he quit smoking about 29 years ago. He has never used smokeless tobacco. He reports that he drinks about 4.2 oz of alcohol per week. He reports that he does not use drugs.  Allergies  Allergen Reactions  . Shellfish Allergy Anaphylaxis    Pt is allergic to mussels   . Brimonidine Itching    Itching and swelling red  . Dorzolamide Hcl-Timolol Mal Other (See Comments)    Eye itch and redness  . Timolol Itching    Itching rash under eyes.    History reviewed. No pertinent family history.  Prior to Admission medications   Medication Sig Start Date End Date Taking? Authorizing Provider  amLODipine (NORVASC) 2.5 MG tablet TAKE 1 TABLET BY MOUTH EVERY DAY 06/18/17  Yes Marletta Lor, MD  aspirin EC 81 MG tablet Take 81 mg by mouth daily.   Yes [provider]  atorvastatin (LIPITOR) 10 MG tablet TAKE 1 TABLET(10 MG) BY MOUTH DAILY 05/17/17  Yes Marletta Lor, MD  atropine 1 % ophthalmic solution Place 1 drop into the right eye at bedtime.  06/26/17  Yes [provider]  AZOPT 1 % ophthalmic suspension Place 1 drop into both eyes 2 (two) times daily.  03/31/14  Yes [provider]  fish oil-omega-3 fatty acids 1000 MG capsule Take 1 g by mouth 2 (two) times daily.    Yes  [provider]  fluticasone (FLONASE) 50 MCG/ACT nasal spray Place 2 sprays into both nostrils daily. 08/02/16  Yes Marletta Lor, MD  fosinopril (MONOPRIL) 20 MG tablet TAKE 1 TABLET BY MOUTH EVERY DAY 06/12/17  Yes Marletta Lor, MD  levocetirizine (XYZAL) 5 MG tablet Take 1 tablet (5 mg total) by mouth every evening. 05/11/16  Yes Marletta Lor, MD  levofloxacin (LEVAQUIN) 500 MG tablet Take 1 tablet (500 mg total) by mouth daily. 07/03/17  Yes Marletta Lor, MD  levothyroxine (SYNTHROID,  LEVOTHROID) 50 MCG tablet Take 1 tablet (50 mcg total) daily by mouth. 11/30/16  Yes Marletta Lor, MD  Meth-Hyo-M Bl-Na Phos-Ph Sal (URIBEL) 118 MG CAPS Take 1 capsule by mouth 3 (three) times daily as needed (urinary pain).  06/30/13  Yes [provider]  NONFORMULARY OR COMPOUNDED [place]: Peripheral Neuropathy Cream - Bupivacaine 1%, Doxepin 3%, Gabapentin 6%, Pentoxifylline 3%, Topiramate 1%, apply 1-2 grams to affected area 3-4 times daily. 07/30/15  Yes Trula Slade, DPM  tamsulosin (FLOMAX) 0.4 MG CAPS capsule TAKE ONE CAPSULE BY MOUTH EVERY DAY 06/20/13  Yes Marletta Lor, MD  Travoprost, BAK Free, (TRAVATAN Z) 0.004 % SOLN ophthalmic solution Place 1 drop into the left eye every evening. 07/03/16  Yes [provider]  MYRBETRIQ 25 MG TB24 tablet TAKE 1 TABLET BY MOUTH DAILY Patient not taking: Reported on 07/05/2017 06/25/14   Marletta Lor, MD    Physical Exam: Vitals:   07/05/17 1300 07/05/17 1436 07/05/17 1500 07/05/17 1530  BP: 102/69 (!) 146/61 (!) 123/96 (!) 134/54  Pulse: 91 83 82 76  Resp: 19 (!) 21 (!) 25 (!) 23  Temp:      TempSrc:      SpO2: 94% 95% 93% 94%  Weight:      Height:        Constitutional: NAD, calm, comfortable Eyes: PERRL, lids and conjunctivae normal ENMT: Mucous membranes are moist. Posterior pharynx clear of any exudate or lesions.Normal dentition.  Neck: normal, supple, no masses, no thyromegaly Respiratory: Bilateral wheezing, no crackles. Normal respiratory effort. No accessory muscle use.  On room air Cardiovascular: Regular rate and rhythm, + systolic murmur Abdomen: no tenderness, no masses palpated. No hepatosplenomegaly. Bowel sounds positive.  Musculoskeletal: no clubbing / cyanosis. No joint deformity upper and lower extremities. Good ROM, no contractures. Normal muscle tone.  Skin: no rashes, lesions, ulcers. No induration Neurologic: CN 2-12 grossly intact. Strength 5/5 in all 4.    Psychiatric: Normal judgment and insight. Alert and oriented x 3. Normal mood.   Labs on Admission: I have personally reviewed following labs and imaging studies  CBC: Recent Labs  Lab 06/29/17 1510 07/05/17 1216  WBC 8.6 9.6  NEUTROABS  --  7.0  HGB 15.2 14.5  HCT 44.1 39.2  MCV 88.1 82.4  PLT 176.0 449   Basic Metabolic Panel: Recent Labs  Lab 06/29/17 1510 07/05/17 1216  NA 134* 122*  K 4.7 3.9  CL 99 88*  CO2 24 17*  GLUCOSE 110* 98  BUN 41* 32*  CREATININE 2.73* 2.38*  CALCIUM 9.1 8.9   GFR: Estimated Creatinine Clearance: 18.3 mL/min (A) (by C-G formula based on SCr of 2.38 mg/dL (H)). Liver Function Tests: Recent Labs  Lab 07/05/17 1216  AST 43*  ALT 43  ALKPHOS 82  BILITOT 2.1*  PROT 7.5  ALBUMIN 4.1   No results for input(s): LIPASE, AMYLASE in the last 168 hours. No results  for input(s): AMMONIA in the last 168 hours. Coagulation Profile: No results for input(s): INR, PROTIME in the last 168 hours. Cardiac Enzymes: Recent Labs  Lab 07/05/17 1216  TROPONINI <0.03   BNP (last 3 results) No results for input(s): PROBNP in the last 8760 hours. HbA1C: No results for input(s): HGBA1C in the last 72 hours. CBG: No results for input(s): GLUCAP in the last 168 hours. Lipid Profile: No results for input(s): CHOL, HDL, LDLCALC, TRIG, CHOLHDL, LDLDIRECT in the last 72 hours. Thyroid Function Tests: No results for input(s): TSH, T4TOTAL, FREET4, T3FREE, THYROIDAB in the last 72 hours. Anemia Panel: No results for input(s): VITAMINB12, FOLATE, FERRITIN, TIBC, IRON, RETICCTPCT in the last 72 hours. Urine analysis:    Component Value Date/Time   COLORURINE GREEN (A) 06/01/2014 1448   APPEARANCEUR CLOUDY (A) 06/01/2014 1448   LABSPEC 1.005 06/01/2014 1448   PHURINE 6.0 06/01/2014 1448   GLUCOSEU NEG 06/01/2014 1448   HGBUR SMALL (A) 06/01/2014 1448   HGBUR large 12/17/2006 1056   BILIRUBINUR NEG 06/01/2014 1448   BILIRUBINUR n 01/30/2014 1508    KETONESUR NEG 06/01/2014 1448   PROTEINUR NEG 06/01/2014 1448   UROBILINOGEN 0.2 06/01/2014 1448   NITRITE NEG 06/01/2014 1448   LEUKOCYTESUR LARGE (A) 06/01/2014 1448   Sepsis Labs: !!!!!!!!!!!!!!!!!!!!!!!!!!!!!!!!!!!!!!!!!!!! @LABRCNTIP (procalcitonin:4,lacticidven:4) )No results found for this or any previous visit (from the past 240 hour(s)).   Radiological Exams on Admission: Dg Chest 2 View  Result Date: 07/05/2017 CLINICAL DATA:  Shortness of breath EXAM: CHEST - 2 VIEW COMPARISON:  06/29/2017 FINDINGS: Borderline heart size. Negative aortic and hilar contours. There is no edema, air bronchogram, effusion, or pneumothorax. Syndesmophytes with multilevel thoracic ankylosis. No acute osseous finding. IMPRESSION: 1. No convincing pneumonia or other acute disease today. 2. Findings of ankylosing spondylitis in the thoracic spine. Electronically Signed   By: Monte Fantasia M.D.   On: 07/05/2017 13:38    EKG: Independently reviewed. Normal sinus rhythm.   Assessment/Plan Principal Problem:   Hyponatremia Active Problems:   HYPERCHOLESTEROLEMIA   Essential hypertension   Chronic kidney disease   Hypothyroidism   Hyponatremia -Suspect dehydration due to nausea, vomiting, diarrhea and poor PO intake  -IVF -Trend BMP q4h   Recent dx pneumonia -Treated as outpatient with azithromycin without improvement, switched to levaquin -CXR without acute pulmonary process -?Bronchitis, wheezing heard on exam. No hx of COPD/asthma. Patient quit smoking in his 28s. Will start prednisone burst   CKD stage 3 -Baseline Cr ~ 1.1-1.2 -Stable   HTN -Continue norvasc  HLD -Continue lipitor   Hypothyroidism -Continue synthroid -Check TSH     DVT prophylaxis: Lovenox Code Status: Full  Family Communication: Daughter at bedside Disposition Plan: Pending improvement, suspect return home setting  Consults called: None  Admission status: Observation   Severity of Illness: The  appropriate patient status for this patient is OBSERVATION. Observation status is judged to be reasonable and necessary in order to provide the required intensity of service to ensure the patient's safety. The patient's presenting symptoms, physical exam findings, and initial radiographic and laboratory data in the context of their medical condition is felt to place them at decreased risk for further clinical deterioration. Furthermore, it is anticipated that the patient will be medically stable for discharge from the hospital within 2 midnights of admission.    Dessa Phi, DO [facility] [email or web address] Password [facility] 07/05/2017, 3:58 PM

## 2017-07-05 NOTE — ED Notes (Signed)
Pt was informed that a urine sample was needed. States he'll be able to in a little while

## 2017-07-05 NOTE — ED Triage Notes (Signed)
Transported by [facility] from home--recently diagnosed with pneumonia 8 days ago. Presents today with n/v/d, decrease in PO intake, cough and wheezing. AAO x 4. EMS administered a Duoneb prior to arrival. Patient has been taking antibiotics for 3 days without much improvement.

## 2017-07-05 NOTE — ED Notes (Signed)
Patient transported to X-ray 

## 2017-07-05 NOTE — ED Notes (Signed)
Attempted to collect pt urine for second time unsuccessfully. Will attempt again

## 2017-07-06 DIAGNOSIS — R197 Diarrhea, unspecified: Secondary | ICD-10-CM

## 2017-07-06 DIAGNOSIS — R112 Nausea with vomiting, unspecified: Secondary | ICD-10-CM

## 2017-07-06 DIAGNOSIS — R638 Other symptoms and signs concerning food and fluid intake: Secondary | ICD-10-CM

## 2017-07-06 DIAGNOSIS — E7800 Pure hypercholesterolemia, unspecified: Secondary | ICD-10-CM | POA: Diagnosis not present

## 2017-07-06 DIAGNOSIS — J209 Acute bronchitis, unspecified: Secondary | ICD-10-CM | POA: Diagnosis not present

## 2017-07-06 DIAGNOSIS — N183 Chronic kidney disease, stage 3 (moderate): Secondary | ICD-10-CM | POA: Diagnosis not present

## 2017-07-06 DIAGNOSIS — I10 Essential (primary) hypertension: Secondary | ICD-10-CM | POA: Diagnosis not present

## 2017-07-06 DIAGNOSIS — E039 Hypothyroidism, unspecified: Secondary | ICD-10-CM | POA: Diagnosis not present

## 2017-07-06 DIAGNOSIS — E871 Hypo-osmolality and hyponatremia: Secondary | ICD-10-CM | POA: Diagnosis not present

## 2017-07-06 LAB — BASIC METABOLIC PANEL
Anion gap: 11 (ref 5–15)
Anion gap: 13 (ref 5–15)
Anion gap: 13 (ref 5–15)
BUN: 28 mg/dL — ABNORMAL HIGH (ref 6–20)
BUN: 29 mg/dL — ABNORMAL HIGH (ref 6–20)
BUN: 31 mg/dL — ABNORMAL HIGH (ref 6–20)
CO2: 20 mmol/L — ABNORMAL LOW (ref 22–32)
CO2: 20 mmol/L — ABNORMAL LOW (ref 22–32)
CO2: 21 mmol/L — ABNORMAL LOW (ref 22–32)
Calcium: 8.4 mg/dL — ABNORMAL LOW (ref 8.9–10.3)
Calcium: 8.6 mg/dL — ABNORMAL LOW (ref 8.9–10.3)
Calcium: 8.7 mg/dL — ABNORMAL LOW (ref 8.9–10.3)
Chloride: 90 mmol/L — ABNORMAL LOW (ref 101–111)
Chloride: 93 mmol/L — ABNORMAL LOW (ref 101–111)
Chloride: 95 mmol/L — ABNORMAL LOW (ref 101–111)
Creatinine, Ser: 2.1 mg/dL — ABNORMAL HIGH (ref 0.61–1.24)
Creatinine, Ser: 2.12 mg/dL — ABNORMAL HIGH (ref 0.61–1.24)
Creatinine, Ser: 2.33 mg/dL — ABNORMAL HIGH (ref 0.61–1.24)
GFR calc Af Amer: 27 mL/min — ABNORMAL LOW (ref >60)
GFR calc Af Amer: 30 mL/min — ABNORMAL LOW (ref >60)
GFR calc Af Amer: 30 mL/min — ABNORMAL LOW (ref >60)
GFR calc non Af Amer: 23 mL/min — ABNORMAL LOW (ref >60)
GFR calc non Af Amer: 26 mL/min — ABNORMAL LOW (ref >60)
GFR calc non Af Amer: 26 mL/min — ABNORMAL LOW (ref >60)
Glucose, Bld: 136 mg/dL — ABNORMAL HIGH (ref 65–99)
Glucose, Bld: 140 mg/dL — ABNORMAL HIGH (ref 65–99)
Glucose, Bld: 153 mg/dL — ABNORMAL HIGH (ref 65–99)
Potassium: 3.9 mmol/L (ref 3.5–5.1)
Potassium: 4 mmol/L (ref 3.5–5.1)
Potassium: 4.1 mmol/L (ref 3.5–5.1)
Sodium: 123 mmol/L — ABNORMAL LOW (ref 135–145)
Sodium: 126 mmol/L — ABNORMAL LOW (ref 135–145)
Sodium: 127 mmol/L — ABNORMAL LOW (ref 135–145)

## 2017-07-06 LAB — CBC
HCT: 39.1 % (ref 39.0–52.0)
Hemoglobin: 14.3 g/dL (ref 13.0–17.0)
MCH: 29.5 pg (ref 26.0–34.0)
MCHC: 36.6 g/dL — ABNORMAL HIGH (ref 30.0–36.0)
MCV: 80.8 fL (ref 78.0–100.0)
Platelets: 220 10*3/uL (ref 150–400)
RBC: 4.84 MIL/uL (ref 4.22–5.81)
RDW: 13.5 % (ref 11.5–15.5)
WBC: 9.8 10*3/uL (ref 4.0–10.5)

## 2017-07-06 MED ORDER — ADULT MULTIVITAMIN W/MINERALS CH
1.0000 | ORAL_TABLET | Freq: Every day | ORAL | Status: DC
  Administered 2017-07-06 – 2017-07-14 (×8): 1 via ORAL
  Filled 2017-07-06 (×9): qty 1

## 2017-07-06 MED ORDER — IPRATROPIUM-ALBUTEROL 0.5-2.5 (3) MG/3ML IN SOLN: 3.0000 mL | RESPIRATORY_TRACT | Status: DC | PRN

## 2017-07-06 MED ORDER — IPRATROPIUM-ALBUTEROL 0.5-2.5 (3) MG/3ML IN SOLN
3.0000 mL | Freq: Two times a day (BID) | RESPIRATORY_TRACT | Status: DC
  Administered 2017-07-07 – 2017-07-08 (×4): 3 mL via RESPIRATORY_TRACT
  Filled 2017-07-06 (×4): qty 3

## 2017-07-06 MED ORDER — IPRATROPIUM-ALBUTEROL 0.5-2.5 (3) MG/3ML IN SOLN
3.0000 mL | Freq: Four times a day (QID) | RESPIRATORY_TRACT | Status: DC
  Administered 2017-07-06: 3 mL via RESPIRATORY_TRACT
  Filled 2017-07-06: qty 3

## 2017-07-06 MED ORDER — BOOST / RESOURCE BREEZE PO LIQD CUSTOM
1.0000 | ORAL | Status: DC
  Administered 2017-07-06 – 2017-07-09 (×3): 1 via ORAL

## 2017-07-06 MED ORDER — GUAIFENESIN ER 600 MG PO TB12
1200.0000 mg | ORAL_TABLET | Freq: Two times a day (BID) | ORAL | Status: DC
  Administered 2017-07-06 – 2017-07-10 (×10): 1200 mg via ORAL
  Filled 2017-07-06 (×12): qty 2

## 2017-07-06 NOTE — Evaluation (Signed)
Physical Therapy Evaluation Patient Details Name: Alexander Mcdonald MRN: [id number] DOB: 1926-07-15 Today's Date: 07/06/2017   History of Present Illness  This 84 year old man was admitted with decreased PO intake, diarrhea and vomiting.  He was recently being treated for pna.  PMH:  prostate CA, CKD, HLD, hypothyroid, HTN.    Clinical Impression  Patient reports independent PTA living with his daughter. Patient requires assistance for bed mobility, transfers and ambulation with RW short distances and activity is limited by fatigue. Pt admitted with above diagnosis. Pt currently with functional limitations due to the deficits listed below (see PT Problem List). Pt will benefit from skilled PT to increase their independence and safety with mobility to allow discharge to the venue listed below.      Follow Up Recommendations SNF    Equipment Recommendations  None recommended by PT(to be determined at next venue of care)    Recommendations for Other Services       Precautions / Restrictions Precautions Precautions: Fall Restrictions Weight Bearing Restrictions: No      Mobility  Bed Mobility Overal bed mobility: Needs Assistance Bed Mobility: Sit to Supine     Supine to sit: Mod assist Sit to supine: Mod assist   General bed mobility comments: assist for legs and trunk  Transfers Overall transfer level: Needs assistance Equipment used: Rolling walker (2 wheeled) Transfers: Sit to/from [facility] Sit to Stand: Min assist Stand pivot transfers: Min assist       General transfer comment: min assist for line management, verbal cues for hand placement and sequencing of steps, as well as orientation to bed due to visual impairment  Ambulation/Gait Ambulation/Gait assistance: Min assist Gait Distance (Feet): 25 Feet Assistive device: Rolling walker (2 wheeled) Gait Pattern/deviations: Step-through pattern;Trunk flexed;Shuffle;Decreased stride length Gait  velocity: decr   General Gait Details: a bit unsteady and shaky during ambulation, weak, fatigued rather quickly  Stairs            Wheelchair Mobility    Modified Rankin (Stroke Patients Only)       Balance Overall balance assessment: Needs assistance Sitting-balance support: Feet supported Sitting balance-Leahy Scale: Good     Standing balance support: Bilateral upper extremity supported;During functional activity Standing balance-Leahy Scale: Poor                               Pertinent Vitals/Pain Pain Assessment: No/denies pain    Home Living Family/patient expects to be discharged to:: Private residence Living Arrangements: Children Available Help at Discharge: Family Type of Home: House Home Access: Stairs to enter   Technical brewer of Steps: 2 Home Layout: Two level;Able to live on main level with bedroom/bathroom Home Equipment: None Additional Comments: lives with daughter who is a Education officer, museum (and used to work at [employer] on weekends)    Prior Function Level of Independence: Independent               Journalist, newspaper        Extremity/Trunk Assessment   Upper Extremity Assessment Upper Extremity Assessment: Generalized weakness    Lower Extremity Assessment Lower Extremity Assessment: Generalized weakness    Cervical / Trunk Assessment Cervical / Trunk Assessment: Kyphotic  Communication   Communication: 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  Thought catheter was still hooked up.  Unaware that pad under him was wet      General Comments      Exercises     Assessment/Plan    PT Assessment Patient needs continued PT  services;All further PT needs can be met in the next venue of care  PT Problem List Decreased strength;Decreased activity tolerance;Decreased balance;Decreased mobility       PT Treatment Interventions DME instruction;Therapeutic activities;Gait training;Therapeutic exercise;Patient/family education;Balance training;Functional mobility training    PT Goals (Current goals can be found in the Care Plan section)  Acute Rehab PT Goals Patient Stated Goal: feel better; get stronger PT Goal Formulation: With patient Time For Goal Achievement: 07/20/17 Potential to Achieve Goals: Fair    Frequency Min 2X/week   Barriers to discharge        Co-evaluation               AM-PAC PT "6 Clicks" Daily Activity  Outcome Measure Difficulty turning over in bed (including adjusting bedclothes, sheets and blankets)?: A Little Difficulty moving from lying on back to sitting on the side of the bed? : A Lot Difficulty sitting down on and standing up from a chair with arms (e.g., wheelchair, bedside commode, etc,.)?: A Little Help needed moving to and from a bed to chair (including a wheelchair)?: A Little Help needed walking in hospital room?: A Little Help needed climbing 3-5 steps with a railing? : A Lot 6 Click Score: 16    End of Session Equipment Utilized During Treatment: Gait belt Activity Tolerance: Patient tolerated treatment well;Patient limited by fatigue Patient left: in bed;with call bell/phone within reach Nurse Communication: Mobility status PT Visit Diagnosis: Unsteadiness on feet (R26.81);Difficulty in walking, not elsewhere classified (R26.2);Muscle weakness (generalized) (M62.81)    Time: 1230-1300 PT Time Calculation (min) (ACUTE ONLY): 30 min   Charges:   PT Evaluation $PT Eval Moderate Complexity: 1 Mod PT Treatments $Gait Training: 8-22 mins   PT G Codes:        Amiere Cawley D. Hartnett-Rands, MS, PT Per [place] [phone] 07/06/2017, 1:20  PM

## 2017-07-06 NOTE — Progress Notes (Signed)
PROGRESS NOTE    Alexander Mcdonald  PPJ:[id number] DOB: 1926/08/09 DOA: 07/05/2017 PCP: Marletta Lor, MD   Brief Narrative:  Alexander Mcdonald is a 84 y.o. male with medical history significant of hypertension, hyperlipidemia, hypothyroidism and other comorbids who presents with decreased p.o. intake, nausea, vomiting, diarrhea. He was recently seen by his primary care physician and was diagnosed with pneumonia and was started on azithromycin.  No improvement and was seen again by his primary care physician, was switched to Levaquin at the time.  Daughter was at bedside and provided most of the history who states that patient has had decreased p.o. intake, nausea, vomiting, diarrhea since antibiotic initiation.  He also has had productive cough of brown sputum as well as wheezing and shortness of breath.  Daughter says she has also had similar upper respiratory issues which lasted about 3 weeks.  He has had no fevers or chills at home, no chest pain or abdominal pain.  Diarrhea is loose and watery in nature but he is again not had any fevers or abdominal pain associated with diarrhea and now it has resolved. In the ED Labs were obtained and revealed sodium 122, creatinine 2.38, lactic acid 1.7, WBC 9.6.  Chest x-ray was negative for infiltrate.  He was referred for admission for hyponatremia as well as poor p.o. intake and currently is being rehydrated and treated for Acute Bronchitis as well.  Assessment & Plan:   Principal Problem:   Hyponatremia Active Problems:   HYPERCHOLESTEROLEMIA   Essential hypertension   Chronic kidney disease   Hypothyroidism  Hyponatremia, improving  -Suspect dehydration due to nausea, vomiting, diarrhea and poor PO intake  -Nausea/Vomiting/Diarrhea improved -Improving. Na+ went from 122 on Admission and is now 127 -C/w IVF Rehydration with Normal Saline at a rate of 75 mL/hr -Continue to Trend BMP q4h for now -Diet Liberalized to Regular Diet and Dietician  Added Boost Breeze 1 container q24h, along with MVI + Minerals and Magic Cup TID with meals -Encourage po Intake   Nausea/Vomiting/Diarrhea, improving  -? Viral Gastroenteritis -C/w Supportive Care and C/w NS at a rate of 75 mL/hr -Antiemetics with Zofran 4 mg p.o./IV every 6 as needed for nausea  Recent Pneumonia but currently suspect Acute Bronchitis -Treated as outpatient with azithromycin without improvement, switched to levaquin and adaquetely covered  -CXR without acute pulmonary process -?Bronchitis, wheezing heard on exam. No hx of COPD/asthma. Patient quit smoking in his 21s.  -Check Respiratory Virus Panel and place on Droplet Precautions -Started Guaifenesin 1200 mg po BID -Also started DuoNeb 3 mL q6h  -C/w Prednisone burst with 50 mg po Daily x5 days   AKI on CKD stage 3 -Baseline Cr ~ 1.1-1.2 and on Admission Cr was 2.38 and trended up to 2.54; Now BUN Cr is 28/2.10 -Avoid Nephrotoxic Medications if possible -Continue IVF Rehydration with NS at a Rate of 75 mL/hr -Continue to Monitor and Repeat CMP in AM   HTN -Continue Amlodipine 2.5 mg po Daily   HLD -Continue Atorvastatin 10 mg po qHS  Hypothyroidism -TSH was 2.351 -C/w Levothyroxine 50 mcg p.o. daily before breakfast  Hx of Prostate Cancer/BPH -C/w Tamsulosin 0.4 mg po Daily  Glaucoma -C/w latanoprost 0.005% ophthalmic solution 1 drop left eye daily at bedtime, brinzolamide 1% ophthalmic suspension 1 drop both eyes twice daily daily, atropine 1% ophthalmic drop 1 drop in the right eye daily at bedtime  DVT prophylaxis: Enoxaparin 30 mg sq q24h Code Status: FULL CODE Family Communication: No  family present at bedside  Disposition Plan: Anticipate SNF Discharge   Consultants:   None   Procedures: None   Antimicrobials:  Anti-infectives (From admission, onward)   None     Subjective: Seen and examined at bedside and states that his nausea and vomiting had improved and he has not had no  more diarrhea.  Had slightly more of an appetite today.  No chest pain, shortness breath, lightheadedness or dizziness.  No other complaints or concerns at this time.  Objective: Vitals:   07/05/17 1530 07/05/17 1616 07/05/17 2123 07/06/17 0602  BP: (!) 134/54 (!) 157/58 (!) 181/72 (!) 160/61  Pulse: 76 76 92 87  Resp: (!) 23 20 18 17   Temp:  97.9 F (36.6 C) 97.8 F (36.6 C) 97.7 F (36.5 C)  TempSrc:  Oral Oral Oral  SpO2: 94% 94% 93% 95%  Weight:      Height:        Intake/Output Summary (Last 24 hours) at 07/06/2017 0813 Last data filed at 07/06/2017 0315 Gross per 24 hour  Intake 813.75 ml  Output -  Net 813.75 ml   Filed Weights   07/05/17 1156  Weight: 73 kg (161 lb)   Examination: Physical Exam:  Constitutional: WN/WD elderly Caucasian male in NAD and appears calm  Eyes: Lids and conjunctivae normal, sclerae anicteric  ENMT: External Ears, Nose appear normal. Slightly hard of hearing.  Neck: Appears normal, supple, no cervical masses, normal ROM, no appreciable thyromegaly Respiratory: Clear to auscultation bilaterally, no wheezing, rales, rhonchi or crackles. Normal respiratory effort and patient is not tachypenic. No accessory muscle use.  Cardiovascular: RRR, Has a loud 3/6 Systolic Murmur. Trace LE edema Abdomen: Soft, non-tender, non-distended. Has a large Central Hernia/Diastasis Recti. Bowel sounds positive x4.  GU: Deferred. Musculoskeletal: No clubbing / cyanosis of digits/nails. No joint deformity upper and lower extremities.  Skin: No rashes, lesions, ulcers on a limited skin evaluation. No induration; Warm and dry.  Neurologic: CN 2-12 grossly intact with no focal deficits. Romberg sign and cerebellar reflexes not assessed.  Psychiatric: Normal judgment and insight. Alert and awake. Normal mood and appropriate affect.   Data Reviewed: I have personally reviewed following labs and imaging studies  CBC: Recent Labs  Lab 06/29/17 1510 07/05/17 1216  07/06/17 0544  WBC 8.6 9.6 9.8  NEUTROABS  --  7.0  --   HGB 15.2 14.5 14.3  HCT 44.1 39.2 39.1  MCV 88.1 82.4 80.8  PLT 176.0 216 195   Basic Metabolic Panel: Recent Labs  Lab 07/05/17 1216 07/05/17 1619 07/05/17 1949 07/06/17 0004 07/06/17 0544  NA 122* 123* 124* 123* 126*  K 3.9 3.7 4.4 4.0 3.9  CL 88* 88* 90* 90* 93*  CO2 17* 19* 20* 20* 20*  GLUCOSE 98 105* 132* 153* 140*  BUN 32* 34* 34* 31* 29*  CREATININE 2.38* 2.54* 2.48* 2.33* 2.12*  CALCIUM 8.9 8.7* 8.7* 8.4* 8.7*   GFR: Estimated Creatinine Clearance: 20.5 mL/min (A) (by C-G formula based on SCr of 2.12 mg/dL (H)). Liver Function Tests: Recent Labs  Lab 07/05/17 1216  AST 43*  ALT 43  ALKPHOS 82  BILITOT 2.1*  PROT 7.5  ALBUMIN 4.1   No results for input(s): LIPASE, AMYLASE in the last 168 hours. No results for input(s): AMMONIA in the last 168 hours. Coagulation Profile: No results for input(s): INR, PROTIME in the last 168 hours. Cardiac Enzymes: Recent Labs  Lab 07/05/17 1216  TROPONINI <0.03   BNP (last 3  results) No results for input(s): PROBNP in the last 8760 hours. HbA1C: No results for input(s): HGBA1C in the last 72 hours. CBG: No results for input(s): GLUCAP in the last 168 hours. Lipid Profile: No results for input(s): CHOL, HDL, LDLCALC, TRIG, CHOLHDL, LDLDIRECT in the last 72 hours. Thyroid Function Tests: Recent Labs    07/05/17 1619  TSH 2.351   Anemia Panel: No results for input(s): VITAMINB12, FOLATE, FERRITIN, TIBC, IRON, RETICCTPCT in the last 72 hours. Sepsis Labs: Recent Labs  Lab 07/05/17 1220  LATICACIDVEN 1.70    No results found for this or any previous visit (from the past 240 hour(s)).   Radiology Studies: Dg Chest 2 View  Result Date: 07/05/2017 CLINICAL DATA:  Shortness of breath EXAM: CHEST - 2 VIEW COMPARISON:  06/29/2017 FINDINGS: Borderline heart size. Negative aortic and hilar contours. There is no edema, air bronchogram, effusion, or  pneumothorax. Syndesmophytes with multilevel thoracic ankylosis. No acute osseous finding. IMPRESSION: 1. No convincing pneumonia or other acute disease today. 2. Findings of ankylosing spondylitis in the thoracic spine. Electronically Signed   By: Monte Fantasia M.D.   On: 07/05/2017 13:38   Scheduled Meds: . amLODipine  2.5 mg Oral Daily  . aspirin EC  81 mg Oral Daily  . atorvastatin  10 mg Oral q1800  . atropine  1 drop Right Eye QHS  . brinzolamide  1 drop Both Eyes BID  . chlorhexidine  15 mL Mouth Rinse BID  . enoxaparin (LOVENOX) injection  30 mg Subcutaneous Q24H  . latanoprost  1 drop Left Eye QHS  . levothyroxine  50 mcg Oral QAC breakfast  . mouth rinse  15 mL Mouth Rinse q12n4p  . predniSONE  50 mg Oral Q breakfast  . tamsulosin  0.4 mg Oral Daily   Continuous Infusions: . sodium chloride 75 mL/hr at 07/06/17 0253    LOS: 0 days   Kerney Elbe, DO [facility] Pager [phone]  If 7PM-7AM, please contact night-coverage [email or web address] Password [facility] 07/06/2017, 8:13 AM

## 2017-07-06 NOTE — Progress Notes (Addendum)
Initial Nutrition Assessment  DOCUMENTATION CODES:   Not applicable  INTERVENTION:  - Will order Boost Breeze once/day, this supplement provides 250 kcal and 9 grams of protein. - Will order Magic Cup TID with dinner meals, this supplement provides 290 kcal and 9 grams of protein. - Will order daily multivitamin with minerals. - Continue to encourage PO intakes. - Diet liberalization from Heart Healthy to Regular.    NUTRITION DIAGNOSIS:   Inadequate oral intake related to acute illness, nausea, vomiting, decreased appetite as evidenced by per patient/family report.  GOAL:   Patient will meet greater than or equal to 90% of their needs  MONITOR:   PO intake, Weight trends, Labs, I & O's  REASON FOR ASSESSMENT:   Consult Assessment of nutrition requirement/status  ASSESSMENT:   84 y.o. male with medical history significant of HTN, hyperlipidemia, and hypothyroidism. He presented to the ED with decreased p.o. intake, nausea, vomiting, and diarrhea. He was recently seen by his PCP and was diagnosed with pneumonia and started on azithromycin. No improvement and was seen again and switched to Levaquin at the time. Daughter states that patient has had decreased p.o. intake, nausea, vomiting, diarrhea since antibiotic initiation. He also has had cough productive of brown sputum, wheezing, and SOB. Daughter says she has also had similar upper respiratory issues which lasted about 3 weeks. He has had no fevers or chills at home, no chest pain or abdominal pain. Diarrhea is loose and watery in nature but he has not had any fevers or abdominal pain associated with diarrhea.  BMI indicates overweight status (appropriate for advanced age). No intakes documented since admission. Patient states that he has been experiencing decreased appetite/intakes, N/V/D for the past 1-1.5 weeks after being changed to Levaquin. He denies nausea at this time but feels tired and worn out from feeling unwell for  >1 week.   He only has 2 teeth and reports that one of his daughters is bringing his partials/dentures today. He has never eaten without wearing his dentures but states that he has not put them in the past few days d/t vomiting frequently. He states that when he attempted to put them in on the morning of admission they felt tight in his mouth so he took them back out.   Unable to obtain much information on intakes over the past week other than that he has had nothing to eat or drink today. Talked with patient about options for lunch (talked with Dr. Alfredia Ferguson who states okay to liberalize diet). Patient is interested in trying mashed potatoes, ice cream, and chicken noodle soup for lunch.  NFPE outlined below. Suspect that wasting is primarily associated with advanced age. Per chart review, patient has lost 6 lbs (3.4% body weight) in the past 1 month. This is not significant for time frame.  Medications reviewed; 50 mcg oral Synthroid/day, 50 mg Deltasone/day.  Labs reviewed; Na: 126 mmol/L, Cl: 93 mmol/L, BUN: 29 mg/dL, creatinine: 2.12 mg/dL, Ca: 8.7 mg/dL, GFR: 26 mL/min.   IVF: NS @ 75 mL/hr.       NUTRITION - FOCUSED PHYSICAL EXAM:    Most Recent Value  Orbital Region  No depletion  Upper Arm Region  Moderate depletion  Thoracic and Lumbar Region  No depletion  Buccal Region  No depletion  Temple Region  No depletion  Clavicle Bone Region  Mild depletion  Clavicle and Acromion Bone Region  Mild depletion  Scapular Bone Region  No depletion  Dorsal Hand  No depletion  Patellar Region  No depletion  Anterior Thigh Region  No depletion  Posterior Calf Region  No depletion  Edema (RD Assessment)  None  Hair  Reviewed  Eyes  Reviewed  Mouth  Reviewed Lavina Hamman has 2 teeth,  daughter is bringing partials/dentures.]  Skin  Reviewed  Nails  Reviewed       Diet Order:   Diet Order           Diet Heart Room service appropriate? Yes; Fluid consistency: Thin  Diet effective now           EDUCATION NEEDS:   No education needs have been identified at this time  Skin:  Skin Assessment: Reviewed RN Assessment  Last BM:  6/20  Height:   Ht Readings from Last 1 Encounters:  07/05/17 5\' 5"  (1.651 m)    Weight:   Wt Readings from Last 1 Encounters:  07/05/17 161 lb (73 kg)    Ideal Body Weight:  61.82 kg  BMI:  Body mass index is 26.79 kg/m.  Estimated Nutritional Needs:   Kcal:  1530-1750 (21-24 kcal/kg)  Protein:  70-80 grams  Fluid:  >/= 1.7 L/day     Jarome Matin, MS, RD, LDN, [facility] Inpatient Clinical Dietitian Pager # [phone] After hours/weekend pager # [phone]

## 2017-07-06 NOTE — Evaluation (Signed)
Occupational Therapy Evaluation Patient Details Name: Alexander Mcdonald MRN: [id number] DOB: May 05, 1926 Today's Date: 07/06/2017    History of Present Illness This 84 year old man was admitted with decreased PO intake, diarrhea and vomiting.  He was recently being treated for pna.  PMH:  prostate CA   Clinical Impression   Pt was admitted for the above.  At baseline, he is independent. Pt currently needs min to mod A for mobility/adls. Will follow in acute setting with min guard level goals.    Follow Up Recommendations  SNF;Supervision/Assistance - 24 hour(if home, recommend HHOT)    Equipment Recommendations  3 in 1 bedside commode    Recommendations for Other Services       Precautions / Restrictions Precautions Precautions: Fall Restrictions Weight Bearing Restrictions: No      Mobility Bed Mobility Overal bed mobility: Needs Assistance Bed Mobility: Supine to Sit     Supine to sit: Mod assist     General bed mobility comments: assist for legs and trunk  Transfers Overall transfer level: Needs assistance Equipment used: Rolling walker (2 wheeled) Transfers: Sit to/from Stand Sit to Stand: Min assist;Mod assist         General transfer comment: mod A for first trial; min for 2nd    Balance Overall balance assessment: Needs assistance Sitting-balance support: Feet supported Sitting balance-Leahy Scale: Good       Standing balance-Leahy Scale: Poor                             ADL either performed or assessed with clinical judgement   ADL Overall ADL's : Needs assistance/impaired Eating/Feeding: Set up;Sitting   Grooming: Set up;Sitting   Upper Body Bathing: Set up;Sitting   Lower Body Bathing: Minimal assistance;Moderate assistance;Sit to/from stand   Upper Body Dressing : Minimal assistance;Sitting   Lower Body Dressing: Moderate assistance;Sit to/from stand   Toilet Transfer: Minimal assistance;Stand-pivot;RW(chair)   Toileting-  Clothing Manipulation and Hygiene: Maximal assistance;Sit to/from stand         General ADL Comments: stood x 2.  Pt needed more assistance first trial.  Unsteady with standing. Bil UEs needed for support. Therapist helped with urinal     Vision   Additional Comments: Pt reports he is blind in R eye; had difficulty seeing call bell on tray initially     Perception     Praxis      Pertinent Vitals/Pain Pain Assessment: No/denies pain     Hand Dominance     Extremity/Trunk Assessment Upper Extremity Assessment Upper Extremity Assessment: Generalized weakness           Communication Communication Communication: HOH   Cognition Arousal/Alertness: Awake/alert Behavior During Therapy: WFL for tasks assessed/performed                                   General Comments: appears mostly wfls; Oriented x 4 and knows daughter's work schedule.  Thought catheter was still hooked up.  Unaware that pad under him was wet   General Comments       Exercises     Shoulder Instructions      Home Living Family/patient expects to be discharged to:: Private residence Living Arrangements: Children                 Bathroom Shower/Tub: Teacher, early years/pre: Standard  Home Equipment: None   Additional Comments: lives with daughter who is a Education officer, museum (and used to work at [employer] on weekends)      Prior Functioning/Environment Level of Independence: Independent                 OT Problem List: Decreased strength;Decreased activity tolerance;Impaired balance (sitting and/or standing);Decreased knowledge of use of DME or AE(vision to be assessed further)      OT Treatment/Interventions: Self-care/ADL training;Energy conservation;DME and/or AE instruction;Patient/family education;Balance training;Therapeutic activities    OT Goals(Current goals can be found in the care plan section) Acute Rehab OT Goals Patient Stated Goal: none  stated OT Goal Formulation: With patient Time For Goal Achievement: 07/20/17 Potential to Achieve Goals: Good ADL Goals Pt Will Perform Grooming: with min guard assist;standing Pt Will Perform Lower Body Bathing: sit to/from stand;with min guard assist Pt Will Perform Lower Body Dressing: sit to/from stand;with min guard assist Pt Will Transfer to Toilet: with min guard assist;ambulating;bedside commode Pt Will Perform Toileting - Clothing Manipulation and hygiene: with min guard assist;sit to/from stand Additional ADL Goal #1: pt will perform bed mobility at min guard level  OT Frequency: Min 2X/week   Barriers to D/C:            Co-evaluation              AM-PAC PT "6 Clicks" Daily Activity     Outcome Measure Help from another person eating meals?: A Little Help from another person taking care of personal grooming?: A Little Help from another person toileting, which includes using toliet, bedpan, or urinal?: A Lot Help from another person bathing (including washing, rinsing, drying)?: A Lot Help from another person to put on and taking off regular upper body clothing?: A Little Help from another person to put on and taking off regular lower body clothing?: A Lot 6 Click Score: 15   End of Session Nurse Communication: Mobility status  Activity Tolerance: Patient tolerated treatment well Patient left: in chair;with call bell/phone within reach;with chair alarm set  OT Visit Diagnosis: Unsteadiness on feet (R26.81)                Time: 2130-8657 OT Time Calculation (min): 38 min Charges:  OT General Charges $OT Visit: 1 Visit OT Evaluation $OT Eval Low Complexity: 1 Low OT Treatments $Self Care/Home Management : 8-22 mins G-Codes:     F[place], OTR/L [phone] 07/06/2017  Joliyah Lippens 07/06/2017, 12:15 PM

## 2017-07-06 NOTE — Care Management Obs Status (Signed)
MEDICARE OBSERVATION STATUS NOTIFICATION   Patient Details  Name: Alexander Mcdonald MRN: [id number] Date of Birth: March 23, 1926   Medicare Observation Status Notification Given:  Yes    Leeroy Cha, RN 07/06/2017, 11:00 AM

## 2017-07-07 ENCOUNTER — Observation Stay (HOSPITAL_COMMUNITY): Payer: Medicare Other

## 2017-07-07 DIAGNOSIS — K922 Gastrointestinal hemorrhage, unspecified: Secondary | ICD-10-CM | POA: Diagnosis not present

## 2017-07-07 DIAGNOSIS — K228 Other specified diseases of esophagus: Secondary | ICD-10-CM | POA: Diagnosis present

## 2017-07-07 DIAGNOSIS — Z87891 Personal history of nicotine dependence: Secondary | ICD-10-CM | POA: Diagnosis not present

## 2017-07-07 DIAGNOSIS — Z9849 Cataract extraction status, unspecified eye: Secondary | ICD-10-CM | POA: Diagnosis not present

## 2017-07-07 DIAGNOSIS — H409 Unspecified glaucoma: Secondary | ICD-10-CM | POA: Diagnosis present

## 2017-07-07 DIAGNOSIS — K208 Other esophagitis: Secondary | ICD-10-CM | POA: Diagnosis not present

## 2017-07-07 DIAGNOSIS — R112 Nausea with vomiting, unspecified: Secondary | ICD-10-CM | POA: Diagnosis present

## 2017-07-07 DIAGNOSIS — R630 Anorexia: Secondary | ICD-10-CM | POA: Diagnosis not present

## 2017-07-07 DIAGNOSIS — K921 Melena: Secondary | ICD-10-CM | POA: Diagnosis not present

## 2017-07-07 DIAGNOSIS — Z7982 Long term (current) use of aspirin: Secondary | ICD-10-CM | POA: Diagnosis not present

## 2017-07-07 DIAGNOSIS — K264 Chronic or unspecified duodenal ulcer with hemorrhage: Secondary | ICD-10-CM | POA: Diagnosis not present

## 2017-07-07 DIAGNOSIS — E860 Dehydration: Secondary | ICD-10-CM | POA: Diagnosis present

## 2017-07-07 DIAGNOSIS — N183 Chronic kidney disease, stage 3 (moderate): Secondary | ICD-10-CM | POA: Diagnosis not present

## 2017-07-07 DIAGNOSIS — I10 Essential (primary) hypertension: Secondary | ICD-10-CM | POA: Diagnosis not present

## 2017-07-07 DIAGNOSIS — N400 Enlarged prostate without lower urinary tract symptoms: Secondary | ICD-10-CM | POA: Diagnosis present

## 2017-07-07 DIAGNOSIS — E7800 Pure hypercholesterolemia, unspecified: Secondary | ICD-10-CM | POA: Diagnosis not present

## 2017-07-07 DIAGNOSIS — I129 Hypertensive chronic kidney disease with stage 1 through stage 4 chronic kidney disease, or unspecified chronic kidney disease: Secondary | ICD-10-CM | POA: Diagnosis present

## 2017-07-07 DIAGNOSIS — N189 Chronic kidney disease, unspecified: Secondary | ICD-10-CM | POA: Diagnosis not present

## 2017-07-07 DIAGNOSIS — J189 Pneumonia, unspecified organism: Secondary | ICD-10-CM | POA: Diagnosis present

## 2017-07-07 DIAGNOSIS — K5730 Diverticulosis of large intestine without perforation or abscess without bleeding: Secondary | ICD-10-CM | POA: Diagnosis not present

## 2017-07-07 DIAGNOSIS — J209 Acute bronchitis, unspecified: Secondary | ICD-10-CM | POA: Diagnosis present

## 2017-07-07 DIAGNOSIS — R195 Other fecal abnormalities: Secondary | ICD-10-CM | POA: Diagnosis not present

## 2017-07-07 DIAGNOSIS — Z888 Allergy status to other drugs, medicaments and biological substances status: Secondary | ICD-10-CM | POA: Diagnosis not present

## 2017-07-07 DIAGNOSIS — I951 Orthostatic hypotension: Secondary | ICD-10-CM | POA: Diagnosis not present

## 2017-07-07 DIAGNOSIS — K269 Duodenal ulcer, unspecified as acute or chronic, without hemorrhage or perforation: Secondary | ICD-10-CM | POA: Diagnosis not present

## 2017-07-07 DIAGNOSIS — G43A0 Cyclical vomiting, not intractable: Secondary | ICD-10-CM | POA: Diagnosis not present

## 2017-07-07 DIAGNOSIS — K209 Esophagitis, unspecified: Secondary | ICD-10-CM | POA: Diagnosis not present

## 2017-07-07 DIAGNOSIS — K2210 Ulcer of esophagus without bleeding: Secondary | ICD-10-CM | POA: Diagnosis not present

## 2017-07-07 DIAGNOSIS — D649 Anemia, unspecified: Secondary | ICD-10-CM | POA: Diagnosis not present

## 2017-07-07 DIAGNOSIS — E871 Hypo-osmolality and hyponatremia: Secondary | ICD-10-CM | POA: Diagnosis not present

## 2017-07-07 DIAGNOSIS — Z8546 Personal history of malignant neoplasm of prostate: Secondary | ICD-10-CM | POA: Diagnosis not present

## 2017-07-07 DIAGNOSIS — Z91013 Allergy to seafood: Secondary | ICD-10-CM | POA: Diagnosis not present

## 2017-07-07 DIAGNOSIS — D62 Acute posthemorrhagic anemia: Secondary | ICD-10-CM | POA: Diagnosis not present

## 2017-07-07 DIAGNOSIS — E039 Hypothyroidism, unspecified: Secondary | ICD-10-CM | POA: Diagnosis not present

## 2017-07-07 DIAGNOSIS — N179 Acute kidney failure, unspecified: Secondary | ICD-10-CM | POA: Diagnosis not present

## 2017-07-07 DIAGNOSIS — R8569 Abnormal cytological findings in specimens from other digestive organs and abdominal cavity: Secondary | ICD-10-CM | POA: Diagnosis not present

## 2017-07-07 DIAGNOSIS — R0602 Shortness of breath: Secondary | ICD-10-CM | POA: Diagnosis not present

## 2017-07-07 DIAGNOSIS — Z8701 Personal history of pneumonia (recurrent): Secondary | ICD-10-CM | POA: Diagnosis not present

## 2017-07-07 LAB — RESPIRATORY PANEL BY PCR

## 2017-07-07 LAB — CBC WITH DIFFERENTIAL/PLATELET
Basophils Absolute: 0 10*3/uL (ref 0.0–0.1)
Basophils Relative: 0 %
Eosinophils Absolute: 0 10*3/uL (ref 0.0–0.7)
Eosinophils Relative: 0 %
HCT: 36.3 % — ABNORMAL LOW (ref 39.0–52.0)
Hemoglobin: 13.2 g/dL (ref 13.0–17.0)
Lymphocytes Relative: 8 %
Lymphs Abs: 1.2 10*3/uL (ref 0.7–4.0)
MCH: 30 pg (ref 26.0–34.0)
MCHC: 36.4 g/dL — ABNORMAL HIGH (ref 30.0–36.0)
MCV: 82.5 fL (ref 78.0–100.0)
Monocytes Absolute: 1.1 10*3/uL — ABNORMAL HIGH (ref 0.1–1.0)
Monocytes Relative: 7 %
Neutro Abs: 13.1 10*3/uL — ABNORMAL HIGH (ref 1.7–7.7)
Neutrophils Relative %: 85 %
Platelets: 233 10*3/uL (ref 150–400)
RBC: 4.4 MIL/uL (ref 4.22–5.81)
RDW: 13.7 % (ref 11.5–15.5)
WBC: 15.4 10*3/uL — ABNORMAL HIGH (ref 4.0–10.5)

## 2017-07-07 LAB — COMPREHENSIVE METABOLIC PANEL
ALT: 36 U/L (ref 17–63)
AST: 39 U/L (ref 15–41)
Albumin: 3.3 g/dL — ABNORMAL LOW (ref 3.5–5.0)
Alkaline Phosphatase: 59 U/L (ref 38–126)
Anion gap: 8 (ref 5–15)
BUN: 31 mg/dL — ABNORMAL HIGH (ref 6–20)
CO2: 21 mmol/L — ABNORMAL LOW (ref 22–32)
Calcium: 8.4 mg/dL — ABNORMAL LOW (ref 8.9–10.3)
Chloride: 99 mmol/L — ABNORMAL LOW (ref 101–111)
Creatinine, Ser: 2 mg/dL — ABNORMAL HIGH (ref 0.61–1.24)
GFR calc Af Amer: 32 mL/min — ABNORMAL LOW (ref >60)
GFR calc non Af Amer: 28 mL/min — ABNORMAL LOW (ref >60)
Glucose, Bld: 112 mg/dL — ABNORMAL HIGH (ref 65–99)
Potassium: 3.9 mmol/L (ref 3.5–5.1)
Sodium: 128 mmol/L — ABNORMAL LOW (ref 135–145)
Total Bilirubin: 1.1 mg/dL (ref 0.3–1.2)
Total Protein: 6.2 g/dL — ABNORMAL LOW (ref 6.5–8.1)

## 2017-07-07 LAB — URINE CULTURE: Culture: NO GROWTH

## 2017-07-07 LAB — PHOSPHORUS: Phosphorus: 3.2 mg/dL (ref 2.5–4.6)

## 2017-07-07 LAB — MAGNESIUM: Magnesium: 1.8 mg/dL (ref 1.7–2.4)

## 2017-07-07 MED ORDER — ALUM & MAG HYDROXIDE-SIMETH 200-200-20 MG/5ML PO SUSP
15.0000 mL | Freq: Four times a day (QID) | ORAL | Status: DC | PRN
  Administered 2017-07-07 – 2017-07-09 (×3): 15 mL via ORAL
  Filled 2017-07-07 (×3): qty 30

## 2017-07-07 NOTE — Clinical Social Work Note (Signed)
Clinical Social Work Assessment  Patient Details  Name: Alexander Mcdonald MRN: [id number] Date of Birth: 05/22/1926  Date of referral:  07/07/17               Reason for consult:  Facility Placement                Permission sought to share information with:  Case Manager, Customer service manager, Family Supports Permission granted to share information::  Yes, Verbal Permission Granted  Name::     Veterinary surgeon::  SNF  Relationship::  Daughter  Contact Information:     Housing/Transportation Living arrangements for the past 2 months:  [place] of Information:  Patient, Adult Children Patient Interpreter Needed:  None Criminal Activity/Legal Involvement Pertinent to Current Situation/Hospitalization:  No - Comment as needed Significant Relationships:  Adult Children, Warehouse manager Lives with:  Adult Children Do you feel safe going back to the place where you live?  Yes Need for family participation in patient care:  Yes (Comment)  Care giving concerns:  No care giving concerns at the time of assessment.    Social Worker assessment / plan:  LCSW consulted for facility placement.   LCSW met with patient at bedside.   Patient admitted for hyponatremia.   According to patient he was independent at baseline. Patient reports he does not drive due to poor vision. Patient is blind in right right eye and impaired vision in the left. Patient also has impaired hearing. Patient reports having stairs in the home. He states that he does not have to navigate them often, but he is able to navigate stairs.   Patient's daughter, [name] present in patients room. She aggrees that patient was independent prior to admission. Patient and daughter are agreeable to SNF at dc. Daughter reports patient was working with [facility] prior to hospital admission.   PLAN: SNF at dc pending patient progress.   Employment status:  Retired Forensic scientist:  Medicare PT  Recommendations:  [place] / Referral to community resources:     Patient/Family's Response to care:  Patient and family appreciative of LCSW services.   Patient/Family's Understanding of and Emotional Response to Diagnosis, Current Treatment, and Prognosis:  Patient and family are realistic about patients current condition. Patient expressed that he is concerned about his weakness. Patients daughter ideally would like patient home with home health if that is an option. However, daughter agreeable to SNF if that is necessary.   Emotional Assessment Appearance:  Appears stated age Attitude/Demeanor/Rapport:    Affect (typically observed):  Accepting, Calm, Pleasant Orientation:  Oriented to Self, Oriented to Place, Oriented to  Time, Oriented to Situation Alcohol / Substance use:  Not Applicable Psych involvement (Current and /or in the community):     Discharge Needs  Concerns to be addressed:  No discharge needs identified Readmission within the last 30 days:  No Current discharge risk:  None Barriers to Discharge:  Continued Medical Work up   [employer], LCSW 07/07/2017, 12:57 PM

## 2017-07-07 NOTE — NC FL2 (Signed)
[place] LEVEL OF CARE SCREENING TOOL     IDENTIFICATION  Patient Name: Alexander Mcdonald Birthdate: Jun 05, 1926 Sex: male Admission Date (Current Location): 07/05/2017  [facility] and [place] Number:  Herbalist and Address:  [facility],  [place], [employer]      Provider Number: [id number]  Attending Physician Name and Address:  Kerney Elbe, DO  Relative Name and Phone Number:       Current Level of Care: Hospital Recommended Level of Care: [place] Prior Approval Number:    Date Approved/Denied: 07/07/17 PASRR Number: [id number]  Discharge Plan: SNF    Current Diagnoses: Patient Active Problem List   Diagnosis Date Noted  . Nausea and vomiting 07/07/2017  . Hyponatremia 07/05/2017  . Hypothyroidism 07/05/2017  . Recurrent UTI 04/10/2013  . Renal failure (ARF), acute on chronic (HCC) 12/25/2011  . Chronic kidney disease 09/12/2011  . CAROTID ARTERY DISEASE 06/01/2009  . HEMORRHOID, THROMBOSED 02/15/2009  . ROSACEA 09/28/2008  . DIVERTICULOSIS OF COLON 02/01/2007  . HYPERCHOLESTEROLEMIA 07/25/2006  . GOUT 07/25/2006  . DEPRESSION 07/25/2006  . Essential hypertension 07/25/2006  . PROSTATE CANCER, HX OF 07/25/2006    Orientation RESPIRATION BLADDER Height & Weight     Self, Time, Situation, Place  Normal Continent Weight: 161 lb (73 kg) Height:  5\' 5"  (165.1 cm)  BEHAVIORAL SYMPTOMS/MOOD NEUROLOGICAL BOWEL NUTRITION STATUS      Continent Diet(See dc summary)  AMBULATORY STATUS COMMUNICATION OF NEEDS Skin   Extensive Assist Verbally Normal                       Personal Care Assistance Level of Assistance  Bathing, Feeding, Dressing Bathing Assistance: Limited assistance Feeding assistance: Independent Dressing Assistance: Limited assistance     Functional Limitations Info  Sight, Hearing, Speech Sight Info: Impaired(blind in right eye, left impaired) Hearing Info:  Impaired Speech Info: Adequate    SPECIAL CARE FACTORS FREQUENCY  PT (By licensed PT), OT (By licensed OT)     PT Frequency: 5x/week OT Frequency: 5x/week            Contractures Contractures Info: Not present    Additional Factors Info  Code Status, Allergies Code Status Info: Full Allergies Info: Shellfish Allergy, Brimonidine, Dorzolamide Hcl-timolol Mal, Timolol           Current Medications (07/07/2017):  This is the current hospital active medication list Current Facility-Administered Medications  Medication Dose Route Frequency Provider Last Rate Last Dose  . 0.9 %  sodium chloride infusion   Intravenous Continuous Dessa Phi, DO 75 mL/hr at 07/06/17 1540    . acetaminophen (TYLENOL) tablet 650 mg  650 mg Oral Q6H PRN Dessa Phi, DO       Or  . acetaminophen (TYLENOL) suppository 650 mg  650 mg Rectal Q6H PRN Dessa Phi, DO      . amLODipine (NORVASC) tablet 2.5 mg  2.5 mg Oral Daily Dessa Phi, DO   2.5 mg at 07/07/17 1019  . aspirin EC tablet 81 mg  81 mg Oral Daily Dessa Phi, DO   81 mg at 07/07/17 1018  . atorvastatin (LIPITOR) tablet 10 mg  10 mg Oral q1800 Dessa Phi, DO   10 mg at 07/06/17 1729  . atropine 1 % ophthalmic solution 1 drop  1 drop Right Eye QHS Dessa Phi, DO   1 drop at 07/06/17 2150  . brinzolamide (AZOPT) 1 % ophthalmic suspension 1 drop  1 drop Both Eyes BID Dessa Phi, DO   1 drop at 07/07/17 1022  . chlorhexidine (PERIDEX) 0.12 % solution 15 mL  15 mL Mouth Rinse BID Dessa Phi, DO   15 mL at 07/07/17 1020  . enoxaparin (LOVENOX) injection 30 mg  30 mg Subcutaneous Q24H Dessa Phi, DO   30 mg at 07/06/17 2011  . feeding supplement (BOOST / RESOURCE BREEZE) liquid 1 Container  1 Container Oral Q24H Raiford Noble [place], [place]   1 Container at 07/06/17 1347  . guaiFENesin (MUCINEX) 12 hr tablet 1,200 mg  1,200 mg Oral BID Raiford Noble Latif, DO   1,200 mg at 07/07/17 1018  . ipratropium-albuterol (DUONEB)  0.5-2.5 (3) MG/3ML nebulizer solution 3 mL  3 mL Nebulization BID Raiford Noble [place], DO   3 mL at 07/07/17 2831  . ipratropium-albuterol (DUONEB) 0.5-2.5 (3) MG/3ML nebulizer solution 3 mL  3 mL Nebulization Q4H PRN Sheikh, Omair Latif, DO      . latanoprost (XALATAN) 0.005 % ophthalmic solution 1 drop  1 drop Left Eye QHS Dessa Phi, DO   1 drop at 07/06/17 2150  . levothyroxine (SYNTHROID, LEVOTHROID) tablet 50 mcg  50 mcg Oral QAC breakfast Dessa Phi, DO   50 mcg at 07/07/17 0803  . MEDLINE mouth rinse  15 mL Mouth Rinse q12n4p Dessa Phi, DO   15 mL at 07/06/17 1540  . multivitamin with minerals tablet 1 tablet  1 tablet Oral Daily Raiford Noble [place], DO   1 tablet at 07/07/17 1018  . ondansetron (ZOFRAN) tablet 4 mg  4 mg Oral Q6H PRN Dessa Phi, DO       Or  . ondansetron [facility]) injection 4 mg  4 mg Intravenous Q6H PRN Dessa Phi, DO   4 mg at 07/07/17 1015  . predniSONE (DELTASONE) tablet 50 mg  50 mg Oral Q breakfast Dessa Phi, DO   50 mg at 07/07/17 1018  . tamsulosin (FLOMAX) capsule 0.4 mg  0.4 mg Oral Daily Dessa Phi, DO   0.4 mg at 07/07/17 1018     Discharge Medications: Please see discharge summary for a list of discharge medications.  Relevant Imaging Results:  Relevant Lab Results:   Additional Information ssn: [id number]  Servando Snare, LCSW

## 2017-07-07 NOTE — Progress Notes (Signed)
PROGRESS NOTE    DIOR STEPTER  QIW:[id number] DOB: [birth year] DOA: 07/05/2017 PCP: Marletta Lor, MD   Brief Narrative:  Alexander Mcdonald is a 84 y.o. male with medical history significant of hypertension, hyperlipidemia, hypothyroidism and other comorbids who presents with decreased p.o. intake, nausea, vomiting, diarrhea. He was recently seen by his primary care physician and was diagnosed with pneumonia and was started on azithromycin.  No improvement and was seen again by his primary care physician, was switched to Levaquin at the time.  Daughter was at bedside and provided most of the history who states that patient has had decreased p.o. intake, nausea, vomiting, diarrhea since antibiotic initiation.  He also has had productive cough of brown sputum as well as wheezing and shortness of breath.  Daughter says she has also had similar upper respiratory issues which lasted about 3 weeks.  He has had no fevers or chills at home, no chest pain or abdominal pain.  Diarrhea is loose and watery in nature but he is again not had any fevers or abdominal pain associated with diarrhea and now it has resolved. In the ED Labs were obtained and revealed sodium 122, creatinine 2.38, lactic acid 1.7, WBC 9.6.  Chest x-ray was negative for infiltrate.  He was referred for admission for hyponatremia as well as poor p.o. intake and currently is being rehydrated and treated for Acute Bronchitis as well. Appetite still remains poor but daughter does not want to try any appetite stimulants.   Assessment & Plan:   Principal Problem:   Hyponatremia Active Problems:   HYPERCHOLESTEROLEMIA   Essential hypertension   Chronic kidney disease   Hypothyroidism   Nausea and vomiting  Hyponatremia, improving slowly -Suspect dehydration due to nausea, vomiting, diarrhea and poor PO intake  -Nausea/Vomiting/Diarrhea improving but remains Nauseous still -Improving. Na+ went from 122 on Admission and is now 128 -C/w  IVF Rehydration with Normal Saline at a rate of 75 mL/hr -Diet Liberalized to Regular Diet and Dietician Added Boost Breeze 1 container q24h, along with MVI + Minerals and Magic Cup TID with meals -Encourage po Intake  -Repeat CMP in AM   Nausea/Vomiting/Diarrhea, improving  -? Viral Gastroenteritis; No longer Vomiting or having diarrhea but continues to be Nauseous  -C/w Supportive Care and C/w NS at a rate of 75 mL/hr -Antiemetics with Zofran 4 mg p.o./IV every 6 as needed for nausea -PT/OT consulted for Generalized Weakness from above and recommending SNF but Daughter wants to PT/OT to   Recent Pneumonia but currently suspect Acute Bronchitis -Treated as outpatient with azithromycin without improvement, switched to levaquin and adaquetely covered  -CXR without acute pulmonary process -?Bronchitis, wheezing heard on exam. No hx of COPD/asthma. Patient quit smoking in his 80s.  -Check Respiratory Virus Panel and place on Droplet Precautions -Started Guaifenesin 1200 mg po BID -Also started DuoNeb 3 mL q6h and will continue -C/w Prednisone burst with 50 mg po Daily x5 days (Day 3/5)  AKI on CKD stage 3, improving  -Baseline Cr ~ 1.1-1.2 and on Admission Cr was 2.38 and trended up to 2.54; Now BUN Cr is 31/2.00 -Avoid Nephrotoxic Medications if possible -Continue IVF Rehydration with NS at a Rate of 75 mL/hr -Continue to Monitor and Repeat CMP in AM   HTN -Continue Amlodipine 2.5 mg po Daily   HLD -Continue Atorvastatin 10 mg po qHS  Hypothyroidism -TSH was 2.351 -C/w Levothyroxine 50 mcg p.o. daily before breakfast  Hx of Prostate Cancer/BPH -C/w Tamsulosin 0.4  mg po Daily  Glaucoma -C/w Latanoprost 0.005% ophthalmic solution 1 drop left eye daily at bedtime, Brinzolamide 1% ophthalmic suspension 1 drop both eyes twice daily daily, Atropine 1% ophthalmic drop 1 drop in the right eye daily at bedtime  Leukocytosis -Likely from Steroid Demargination from Prednisone  Administration -WBC went from 9.8 -> 15.4 -Continue to Monitor for S/Sx of Infection -Repeat CBC in AM   DVT prophylaxis: Enoxaparin 30 mg sq q24h Code Status: FULL CODE Family Communication: No family present at bedside  Disposition Plan: Anticipate SNF Discharge   Consultants:   None   Procedures: None   Antimicrobials:  Anti-infectives (From admission, onward)   None     Subjective: Seen and examined at bedside and states that his appetite was not great and still continues to be nauseous.  No lightheadedness or dizziness.  Discussed with daughter at bedside about the plan and she wants PT to reevaluate before decision made for skilled nursing facility.  No other concerns or complaints at this time and patient states that he will try to eat a little bit more today.  Objective: Vitals:   07/06/17 1500 07/06/17 2019 07/06/17 2020 07/07/17 0553  BP: (!) 110/53 (!) 121/55  (!) 152/82  Pulse: 90 77 70 78  Resp: (!) 24 (!) 21  18  Temp: 98.3 F (36.8 C) (!) 97.5 F (36.4 C)  97.9 F (36.6 C)  TempSrc: Oral Oral  Oral  SpO2: 92% (!) 88% 94% 93%  Weight:      Height:        Intake/Output Summary (Last 24 hours) at 07/07/2017 4174 Last data filed at 07/07/2017 0814 Gross per 24 hour  Intake 2135 ml  Output 1005 ml  Net 1130 ml   Filed Weights   07/05/17 1156  Weight: 73 kg (161 lb)   Examination: Physical Exam:  Constitutional: Well nourished, well-developed elderly Caucasian male who is currently in no acute distress however is complaining of some nausea and still is not eating very well Eyes: Lids and conjunctive are normal.  Sclera anicteric. ENMT: External ears and nose appear normal.  Is slightly hard of hearing Neck: Appears supple with no JVD Respiratory: Diminished slightly to auscultation bilaterally with mild upper airway wheezing.  No appreciable rales, rhonchi, crackles.  Unlabored breathing and patient not tachypneic Cardiovascular: Regular rate and  rhythm.  Has a loud 3 out of 6 systolic murmur.  Mild lower extremity edema Abdomen: Soft, nontender, slightly distended.  Has a large central hernia/diastases recti.  Bowel sounds present  GU: Deferred Musculoskeletal: No clubbing or cyanosis.  No joint deformities noted Skin: Skin is warm and dry with no appreciable rashes or lesions on limited skin evaluation Neurologic: Cranial nerves II through XII grossly intact no appreciable focal deficits Psychiatric: Normal mood and affect.  Intact judgment intact.  Pleasant to speak with  Data Reviewed: I have personally reviewed following labs and imaging studies  CBC: Recent Labs  Lab 07/05/17 1216 07/06/17 0544 07/07/17 0559  WBC 9.6 9.8 15.4*  NEUTROABS 7.0  --  13.1*  HGB 14.5 14.3 13.2  HCT 39.2 39.1 36.3*  MCV 82.4 80.8 82.5  PLT 216 220 481   Basic Metabolic Panel: Recent Labs  Lab 07/05/17 1949 07/06/17 0004 07/06/17 0544 07/06/17 1027 07/07/17 0559  NA 124* 123* 126* 127* 128*  K 4.4 4.0 3.9 4.1 3.9  CL 90* 90* 93* 95* 99*  CO2 20* 20* 20* 21* 21*  GLUCOSE 132* 153* 140* 136* 112*  BUN 34* 31* 29* 28* 31*  CREATININE 2.48* 2.33* 2.12* 2.10* 2.00*  CALCIUM 8.7* 8.4* 8.7* 8.6* 8.4*  MG  --   --   --   --  1.8  PHOS  --   --   --   --  3.2   GFR: Estimated Creatinine Clearance: 21.8 mL/min (A) (by C-G formula based on SCr of 2 mg/dL (H)). Liver Function Tests: Recent Labs  Lab 07/05/17 1216 07/07/17 0559  AST 43* 39  ALT 43 36  ALKPHOS 82 59  BILITOT 2.1* 1.1  PROT 7.5 6.2*  ALBUMIN 4.1 3.3*   No results for input(s): LIPASE, AMYLASE in the last 168 hours. No results for input(s): AMMONIA in the last 168 hours. Coagulation Profile: No results for input(s): INR, PROTIME in the last 168 hours. Cardiac Enzymes: Recent Labs  Lab 07/05/17 1216  TROPONINI <0.03   BNP (last 3 results) No results for input(s): PROBNP in the last 8760 hours. HbA1C: No results for input(s): HGBA1C in the last 72  hours. CBG: No results for input(s): GLUCAP in the last 168 hours. Lipid Profile: No results for input(s): CHOL, HDL, LDLCALC, TRIG, CHOLHDL, LDLDIRECT in the last 72 hours. Thyroid Function Tests: Recent Labs    07/05/17 1619  TSH 2.351   Anemia Panel: No results for input(s): VITAMINB12, FOLATE, FERRITIN, TIBC, IRON, RETICCTPCT in the last 72 hours. Sepsis Labs: Recent Labs  Lab 07/05/17 1220  LATICACIDVEN 1.70    Recent Results (from the past 240 hour(s))  Respiratory Panel by PCR     Status: None   Collection Time: 07/06/17 10:05 AM  Result Value Ref Range Status   Adenovirus NOT DETECTED NOT DETECTED Final   Coronavirus 229E NOT DETECTED NOT DETECTED Final   Coronavirus HKU1 NOT DETECTED NOT DETECTED Final   Coronavirus NL63 NOT DETECTED NOT DETECTED Final   Coronavirus OC43 NOT DETECTED NOT DETECTED Final   Metapneumovirus NOT DETECTED NOT DETECTED Final   Rhinovirus / Enterovirus NOT DETECTED NOT DETECTED Final   Influenza A NOT DETECTED NOT DETECTED Final   Influenza B NOT DETECTED NOT DETECTED Final   Parainfluenza Virus 1 NOT DETECTED NOT DETECTED Final   Parainfluenza Virus 2 NOT DETECTED NOT DETECTED Final   Parainfluenza Virus 3 NOT DETECTED NOT DETECTED Final   Parainfluenza Virus 4 NOT DETECTED NOT DETECTED Final   Respiratory Syncytial Virus NOT DETECTED NOT DETECTED Final   Bordetella pertussis NOT DETECTED NOT DETECTED Final   Chlamydophila pneumoniae NOT DETECTED NOT DETECTED Final   Mycoplasma pneumoniae NOT DETECTED NOT DETECTED Final     Radiology Studies: Dg Chest 2 View  Result Date: 07/05/2017 CLINICAL DATA:  Shortness of breath EXAM: CHEST - 2 VIEW COMPARISON:  06/29/2017 FINDINGS: Borderline heart size. Negative aortic and hilar contours. There is no edema, air bronchogram, effusion, or pneumothorax. Syndesmophytes with multilevel thoracic ankylosis. No acute osseous finding. IMPRESSION: 1. No convincing pneumonia or other acute disease today.  2. Findings of ankylosing spondylitis in the thoracic spine. Electronically Signed   By: Monte Fantasia M.D.   On: 07/05/2017 13:38   Dg Chest Port 1 View  Result Date: 07/07/2017 CLINICAL DATA:  Shortness of breath. EXAM: PORTABLE CHEST 1 VIEW COMPARISON:  07/05/2017 FINDINGS: The patient is mildly rotated to the right. The cardiac silhouette is borderline enlarged. Aortic atherosclerosis is noted. Lung volumes are lower than on the prior study. There is increased airspace opacity in the left lung base, predominantly laterally. A small left pleural effusion  is not excluded. No pneumothorax is identified. IMPRESSION: New left basilar opacity which may reflect atelectasis or pneumonia. Electronically Signed   By: Logan Bores M.D.   On: 07/07/2017 07:18   Scheduled Meds: . amLODipine  2.5 mg Oral Daily  . aspirin EC  81 mg Oral Daily  . atorvastatin  10 mg Oral q1800  . atropine  1 drop Right Eye QHS  . brinzolamide  1 drop Both Eyes BID  . chlorhexidine  15 mL Mouth Rinse BID  . enoxaparin (LOVENOX) injection  30 mg Subcutaneous Q24H  . feeding supplement  1 Container Oral Q24H  . guaiFENesin  1,200 mg Oral BID  . ipratropium-albuterol  3 mL Nebulization BID  . latanoprost  1 drop Left Eye QHS  . levothyroxine  50 mcg Oral QAC breakfast  . mouth rinse  15 mL Mouth Rinse q12n4p  . multivitamin with minerals  1 tablet Oral Daily  . predniSONE  50 mg Oral Q breakfast  . tamsulosin  0.4 mg Oral Daily   Continuous Infusions: . sodium chloride 75 mL/hr at 07/06/17 1540    LOS: 0 days   Kerney Elbe, DO [facility] Pager [phone]  If 7PM-7AM, please contact night-coverage [email or web address] Password [facility] 07/07/2017, 8:29 AM

## 2017-07-08 ENCOUNTER — Inpatient Hospital Stay (HOSPITAL_COMMUNITY): Payer: Medicare Other

## 2017-07-08 LAB — CBC WITH DIFFERENTIAL/PLATELET
Basophils Absolute: 0 10*3/uL (ref 0.0–0.1)
Basophils Relative: 0 %
Eosinophils Absolute: 0 10*3/uL (ref 0.0–0.7)
Eosinophils Relative: 0 %
HCT: 36.4 % — ABNORMAL LOW (ref 39.0–52.0)
Hemoglobin: 13.2 g/dL (ref 13.0–17.0)
Lymphocytes Relative: 10 %
Lymphs Abs: 1.1 10*3/uL (ref 0.7–4.0)
MCH: 30.2 pg (ref 26.0–34.0)
MCHC: 36.3 g/dL — ABNORMAL HIGH (ref 30.0–36.0)
MCV: 83.3 fL (ref 78.0–100.0)
Monocytes Absolute: 0.7 10*3/uL (ref 0.1–1.0)
Monocytes Relative: 6 %
Neutro Abs: 9.6 10*3/uL — ABNORMAL HIGH (ref 1.7–7.7)
Neutrophils Relative %: 84 %
Platelets: 222 10*3/uL (ref 150–400)
RBC: 4.37 MIL/uL (ref 4.22–5.81)
RDW: 14 % (ref 11.5–15.5)
WBC: 11.3 10*3/uL — ABNORMAL HIGH (ref 4.0–10.5)

## 2017-07-08 LAB — COMPREHENSIVE METABOLIC PANEL
ALT: 38 U/L (ref 17–63)
AST: 38 U/L (ref 15–41)
Albumin: 3.1 g/dL — ABNORMAL LOW (ref 3.5–5.0)
Alkaline Phosphatase: 55 U/L (ref 38–126)
Anion gap: 7 (ref 5–15)
BUN: 31 mg/dL — ABNORMAL HIGH (ref 6–20)
CO2: 22 mmol/L (ref 22–32)
Calcium: 8.4 mg/dL — ABNORMAL LOW (ref 8.9–10.3)
Chloride: 103 mmol/L (ref 101–111)
Creatinine, Ser: 1.77 mg/dL — ABNORMAL HIGH (ref 0.61–1.24)
GFR calc Af Amer: 37 mL/min — ABNORMAL LOW (ref >60)
GFR calc non Af Amer: 32 mL/min — ABNORMAL LOW (ref >60)
Glucose, Bld: 112 mg/dL — ABNORMAL HIGH (ref 65–99)
Potassium: 4.2 mmol/L (ref 3.5–5.1)
Sodium: 132 mmol/L — ABNORMAL LOW (ref 135–145)
Total Bilirubin: 0.7 mg/dL (ref 0.3–1.2)
Total Protein: 5.9 g/dL — ABNORMAL LOW (ref 6.5–8.1)

## 2017-07-08 LAB — PHOSPHORUS: Phosphorus: 3 mg/dL (ref 2.5–4.6)

## 2017-07-08 LAB — MAGNESIUM: Magnesium: 2.1 mg/dL (ref 1.7–2.4)

## 2017-07-08 NOTE — Progress Notes (Signed)
PROGRESS NOTE    Alexander Mcdonald  PRF:[id number] DOB: 08/18/1926 DOA: 07/05/2017 PCP: Marletta Lor, MD   Brief Narrative:  Alexander Mcdonald is a 84 y.o. male with medical history significant of hypertension, hyperlipidemia, hypothyroidism and other comorbids who presents with decreased p.o. intake, nausea, vomiting, diarrhea. He was recently seen by his primary care physician and was diagnosed with pneumonia and was started on azithromycin.  No improvement and was seen again by his primary care physician, was switched to Levaquin at the time.  Daughter was at bedside and provided most of the history who states that patient has had decreased p.o. intake, nausea, vomiting, diarrhea since antibiotic initiation.  He also has had productive cough of brown sputum as well as wheezing and shortness of breath.  Daughter says she has also had similar upper respiratory issues which lasted about 3 weeks.  He has had no fevers or chills at home, no chest pain or abdominal pain.  Diarrhea is loose and watery in nature but he is again not had any fevers or abdominal pain associated with diarrhea and now it has resolved. In the ED Labs were obtained and revealed sodium 122, creatinine 2.38, lactic acid 1.7, WBC 9.6.  Chest x-ray was negative for infiltrate.  He was referred for admission for hyponatremia as well as poor p.o. intake and currently is being rehydrated and treated for Acute Bronchitis as well. Appetite remained poor but daughter does not want to try any appetite stimulants. Today his appetite was slightly better and his sodium and renal function are improving.   Assessment & Plan:   Principal Problem:   Hyponatremia Active Problems:   HYPERCHOLESTEROLEMIA   Essential hypertension   Chronic kidney disease   Hypothyroidism   Nausea and vomiting  Hyponatremia, improving slowly -Suspect dehydration due to nausea, vomiting, diarrhea and poor PO intake  -Nausea/Vomiting/Diarrhea improving but  remains Nauseous still -Improving. Na+ went from 122 on Admission and is now 132 -C/w IVF Rehydration with Normal Saline at a rate of 75 mL/hr again today  -Diet Liberalized to Regular Diet and Dietician Added Boost Breeze 1 container q24h, along with MVI + Minerals and Magic Cup TID with meals -Encourage po Intake  -Repeat CMP in AM   Nausea/Vomiting/Diarrhea, improved -? Viral Gastroenteritis; No longer Vomiting or having diarrhea but continues to be slightly Nauseous  -C/w Supportive Care and C/w NS at a rate of 75 mL/hr as above -Antiemetics with Zofran 4 mg p.o./IV every 6 as needed for nausea -PT/OT consulted for Generalized Weakness from above and recommending SNF but Daughter wants to PT/OT to   Recent Pneumonia but currently suspect Acute Bronchitis -Treated as outpatient with azithromycin without improvement, switched to levaquin and adaquetely covered  -CXR without acute pulmonary process -?Bronchitis, wheezing heard on exam. No hx of COPD/asthma. Patient quit smoking in his 23s.  -Check Respiratory Virus Panel and place on Droplet Precautions -Started Guaifenesin 1200 mg po BID -Also started DuoNeb 3 mL q6h and will continue -C/w Prednisone burst with 50 mg po Daily x5 days (Day 4/5)  AKI on CKD stage 3, improving  -Baseline Cr ~ 1.1-1.2 and on Admission Cr was 2.38 and trended up to 2.54; Now BUN Cr is 31/1.77 -Avoid Nephrotoxic Medications if possible -Continue IVF Rehydration with NS at a Rate of 75 mL/hr -Continue to Monitor and Repeat CMP in AM   HTN -Continue Amlodipine 2.5 mg po Daily   HLD -Continue Atorvastatin 10 mg po qHS  Hypothyroidism -TSH  was 2.351 -C/w Levothyroxine 50 mcg p.o. daily before breakfast  Hx of Prostate Cancer/BPH -C/w Tamsulosin 0.4 mg po Daily  Glaucoma -C/w Latanoprost 0.005% ophthalmic solution 1 drop left eye daily at bedtime, Brinzolamide 1% ophthalmic suspension 1 drop both eyes twice daily daily, Atropine 1% ophthalmic  drop 1 drop in the right eye daily at bedtime  Leukocytosis -Likely from Steroid Demargination from Prednisone Administration -WBC went from 9.8 -> 15.4 -> 11.3 -Continue to Monitor for S/Sx of Infection -Repeat CBC in AM   DVT prophylaxis: Enoxaparin 30 mg sq q24h Code Status: FULL CODE Family Communication: No family present at bedside  Disposition Plan: Anticipate SNF Discharge but will have PT/OT Re-evaluate  Consultants:   None   Procedures: None   Antimicrobials:  Anti-infectives (From admission, onward)   None     Subjective: Seen and examined at bedside and sitting in the chair bedside and had just eaten breakfast.  Per patient this with the most he is eaten in a few days.  No lightheadedness or dizziness.  Continued to be slightly nauseous but he states it is bearable.  No other concerns or complaints at this time.  Objective: Vitals:   07/07/17 2048 07/07/17 2250 07/08/17 0529 07/08/17 0837  BP: 138/61  (!) 153/66   Pulse: 69  81   Resp: 18  18   Temp: 98.1 F (36.7 C)  98.2 F (36.8 C)   TempSrc: Oral  Oral   SpO2: 98% 98% 100% 96%  Weight:      Height:        Intake/Output Summary (Last 24 hours) at 07/08/2017 1300 Last data filed at 07/08/2017 1030 Gross per 24 hour  Intake 877.5 ml  Output 950 ml  Net -72.5 ml   Filed Weights   07/05/17 1156  Weight: 73 kg (161 lb)   Examination: Physical Exam:  Constitutional: Well-nourished, well-developed elderly Caucasian male who is currently in no acute distress and is sitting in chair bedside and had just finished breakfast. Eyes: Lids and conjunctive are normal.  Sclera anicteric. ENMT: External ears and nose appear normal.  Is slightly hard of hearing. Mucous membranes are more moist. Neck: Supple with no JVD Respiratory: Diminished to auscultation bilaterally with no appreciable upper airway wheezing today.  No appreciable rales, rhonchi, crackles.  Patient unlabored breathing is not  tachypneic Cardiovascular: Regular rate and rhythm.  Has a loud 3/6 systolic murmur best auscultated on his left side.  Trace lower extremity edema Abdomen: Soft, nontender, slightly distended.  Has a large central hernia/diastases recti.  Bowel sounds present GU: Deferred Musculoskeletal: No contractures or cyanosis.  No joint deformities noted Skin: Skin is warm and dry with no appreciable rashes or lesions on limited skin evaluation Neurologic: Cranial nerves II through XII grossly intact no appreciable focal deficits Psychiatric: Normal mood and affect.  Intact judgment and insight.  Patient is very pleasant to speak with and is awake, alert, and oriented x3  Data Reviewed: I have personally reviewed following labs and imaging studies  CBC: Recent Labs  Lab 07/05/17 1216 07/06/17 0544 07/07/17 0559 07/08/17 0537  WBC 9.6 9.8 15.4* 11.3*  NEUTROABS 7.0  --  13.1* 9.6*  HGB 14.5 14.3 13.2 13.2  HCT 39.2 39.1 36.3* 36.4*  MCV 82.4 80.8 82.5 83.3  PLT 216 220 233 161   Basic Metabolic Panel: Recent Labs  Lab 07/06/17 0004 07/06/17 0544 07/06/17 1027 07/07/17 0559 07/08/17 0537  NA 123* 126* 127* 128* 132*  K 4.0  3.9 4.1 3.9 4.2  CL 90* 93* 95* 99* 103  CO2 20* 20* 21* 21* 22  GLUCOSE 153* 140* 136* 112* 112*  BUN 31* 29* 28* 31* 31*  CREATININE 2.33* 2.12* 2.10* 2.00* 1.77*  CALCIUM 8.4* 8.7* 8.6* 8.4* 8.4*  MG  --   --   --  1.8 2.1  PHOS  --   --   --  3.2 3.0   GFR: Estimated Creatinine Clearance: 24.6 mL/min (A) (by C-G formula based on SCr of 1.77 mg/dL (H)). Liver Function Tests: Recent Labs  Lab 07/05/17 1216 07/07/17 0559 07/08/17 0537  AST 43* 39 38  ALT 43 36 38  ALKPHOS 82 59 55  BILITOT 2.1* 1.1 0.7  PROT 7.5 6.2* 5.9*  ALBUMIN 4.1 3.3* 3.1*   No results for input(s): LIPASE, AMYLASE in the last 168 hours. No results for input(s): AMMONIA in the last 168 hours. Coagulation Profile: No results for input(s): INR, PROTIME in the last 168  hours. Cardiac Enzymes: Recent Labs  Lab 07/05/17 1216  TROPONINI <0.03   BNP (last 3 results) No results for input(s): PROBNP in the last 8760 hours. HbA1C: No results for input(s): HGBA1C in the last 72 hours. CBG: No results for input(s): GLUCAP in the last 168 hours. Lipid Profile: No results for input(s): CHOL, HDL, LDLCALC, TRIG, CHOLHDL, LDLDIRECT in the last 72 hours. Thyroid Function Tests: Recent Labs    07/05/17 1619  TSH 2.351   Anemia Panel: No results for input(s): VITAMINB12, FOLATE, FERRITIN, TIBC, IRON, RETICCTPCT in the last 72 hours. Sepsis Labs: Recent Labs  Lab 07/05/17 1220  LATICACIDVEN 1.70    Recent Results (from the past 240 hour(s))  Culture, Urine     Status: None   Collection Time: 07/05/17  4:57 PM  Result Value Ref Range Status   Specimen Description   Final    URINE, RANDOM Performed at [facility] [address], [place], [place] [address]    Special Requests   Final    NONE Performed at [facility], [place] [address] [place], [place] [address]    Culture   Final    NO GROWTH Performed at [facility], [place] [address], [place], [place] [address]    Report Status 07/07/2017 FINAL  Final  Respiratory Panel by PCR     Status: None   Collection Time: 07/06/17 10:05 AM  Result Value Ref Range Status   Adenovirus NOT DETECTED NOT DETECTED Final   Coronavirus 229E NOT DETECTED NOT DETECTED Final   Coronavirus HKU1 NOT DETECTED NOT DETECTED Final   Coronavirus NL63 NOT DETECTED NOT DETECTED Final   Coronavirus OC43 NOT DETECTED NOT DETECTED Final   Metapneumovirus NOT DETECTED NOT DETECTED Final   Rhinovirus / Enterovirus NOT DETECTED NOT DETECTED Final   Influenza A NOT DETECTED NOT DETECTED Final   Influenza B NOT DETECTED NOT DETECTED Final   Parainfluenza Virus 1 NOT DETECTED NOT DETECTED Final   Parainfluenza Virus 2 NOT DETECTED NOT DETECTED Final   Parainfluenza Virus 3 NOT  DETECTED NOT DETECTED Final   Parainfluenza Virus 4 NOT DETECTED NOT DETECTED Final   Respiratory Syncytial Virus NOT DETECTED NOT DETECTED Final   Bordetella pertussis NOT DETECTED NOT DETECTED Final   Chlamydophila pneumoniae NOT DETECTED NOT DETECTED Final   Mycoplasma pneumoniae NOT DETECTED NOT DETECTED Final     Radiology Studies: Dg Chest Port 1 View  Result Date: 07/08/2017 CLINICAL DATA:  Short of breath.  Follow-up exam. EXAM: PORTABLE CHEST 1 VIEW COMPARISON:  07/07/2017 and older exams. FINDINGS: Opacity noted at the left lateral lung base on the most recent prior study has improved. The original subtle opacity at the left base noted on 06/29/2017 has also improved. Lungs now appear clear. Lungs remain hyperexpanded. No convincing pleural effusion.  No pneumothorax. Heart, mediastinum and hila are unremarkable. IMPRESSION: 1. Improved lung aeration. Left lung base opacity noted on the prior study has essentially resolved consistent with resolved atelectasis. No convincing residual pneumonia. No current acute cardiopulmonary disease. Electronically Signed   By: Lajean Manes M.D.   On: 07/08/2017 07:34   Dg Chest Port 1 View  Result Date: 07/07/2017 CLINICAL DATA:  Shortness of breath. EXAM: PORTABLE CHEST 1 VIEW COMPARISON:  07/05/2017 FINDINGS: The patient is mildly rotated to the right. The cardiac silhouette is borderline enlarged. Aortic atherosclerosis is noted. Lung volumes are lower than on the prior study. There is increased airspace opacity in the left lung base, predominantly laterally. A small left pleural effusion is not excluded. No pneumothorax is identified. IMPRESSION: New left basilar opacity which may reflect atelectasis or pneumonia. Electronically Signed   By: Logan Bores M.D.   On: 07/07/2017 07:18   Scheduled Meds: . amLODipine  2.5 mg Oral Daily  . aspirin EC  81 mg Oral Daily  . atorvastatin  10 mg Oral q1800  . atropine  1 drop Right Eye QHS  . brinzolamide   1 drop Both Eyes BID  . chlorhexidine  15 mL Mouth Rinse BID  . enoxaparin (LOVENOX) injection  30 mg Subcutaneous Q24H  . feeding supplement  1 Container Oral Q24H  . guaiFENesin  1,200 mg Oral BID  . ipratropium-albuterol  3 mL Nebulization BID  . latanoprost  1 drop Left Eye QHS  . levothyroxine  50 mcg Oral QAC breakfast  . mouth rinse  15 mL Mouth Rinse q12n4p  . multivitamin with minerals  1 tablet Oral Daily  . predniSONE  50 mg Oral Q breakfast  . tamsulosin  0.4 mg Oral Daily   Continuous Infusions: . sodium chloride 75 mL/hr at 07/07/17 1716    LOS: 1 day   Kerney Elbe, DO [facility] Pager [phone]  If 7PM-7AM, please contact night-coverage [email or web address] Password [email or web address] 07/08/2017, 1:00 PM

## 2017-07-09 DIAGNOSIS — E871 Hypo-osmolality and hyponatremia: Secondary | ICD-10-CM

## 2017-07-09 DIAGNOSIS — K921 Melena: Secondary | ICD-10-CM

## 2017-07-09 DIAGNOSIS — D649 Anemia, unspecified: Secondary | ICD-10-CM

## 2017-07-09 DIAGNOSIS — I951 Orthostatic hypotension: Secondary | ICD-10-CM

## 2017-07-09 DIAGNOSIS — K922 Gastrointestinal hemorrhage, unspecified: Secondary | ICD-10-CM

## 2017-07-09 LAB — CBC WITH DIFFERENTIAL/PLATELET
Basophils Absolute: 0 10*3/uL (ref 0.0–0.1)
Basophils Relative: 0 %
Eosinophils Absolute: 0 10*3/uL (ref 0.0–0.7)
Eosinophils Relative: 0 %
HCT: 34 % — ABNORMAL LOW (ref 39.0–52.0)
Hemoglobin: 12 g/dL — ABNORMAL LOW (ref 13.0–17.0)
Lymphocytes Relative: 9 %
Lymphs Abs: 1 10*3/uL (ref 0.7–4.0)
MCH: 29.8 pg (ref 26.0–34.0)
MCHC: 35.3 g/dL (ref 30.0–36.0)
MCV: 84.4 fL (ref 78.0–100.0)
Monocytes Absolute: 0.6 10*3/uL (ref 0.1–1.0)
Monocytes Relative: 6 %
Neutro Abs: 9.9 10*3/uL — ABNORMAL HIGH (ref 1.7–7.7)
Neutrophils Relative %: 85 %
Platelets: 219 10*3/uL (ref 150–400)
RBC: 4.03 MIL/uL — ABNORMAL LOW (ref 4.22–5.81)
RDW: 14.2 % (ref 11.5–15.5)
WBC: 11.8 10*3/uL — ABNORMAL HIGH (ref 4.0–10.5)

## 2017-07-09 LAB — COMPREHENSIVE METABOLIC PANEL
ALT: 39 U/L (ref 17–63)
AST: 35 U/L (ref 15–41)
Albumin: 2.9 g/dL — ABNORMAL LOW (ref 3.5–5.0)
Alkaline Phosphatase: 51 U/L (ref 38–126)
Anion gap: 6 (ref 5–15)
BUN: 29 mg/dL — ABNORMAL HIGH (ref 6–20)
CO2: 21 mmol/L — ABNORMAL LOW (ref 22–32)
Calcium: 8 mg/dL — ABNORMAL LOW (ref 8.9–10.3)
Chloride: 105 mmol/L (ref 101–111)
Creatinine, Ser: 1.51 mg/dL — ABNORMAL HIGH (ref 0.61–1.24)
GFR calc Af Amer: 45 mL/min — ABNORMAL LOW (ref >60)
GFR calc non Af Amer: 39 mL/min — ABNORMAL LOW (ref >60)
Glucose, Bld: 108 mg/dL — ABNORMAL HIGH (ref 65–99)
Potassium: 4.2 mmol/L (ref 3.5–5.1)
Sodium: 132 mmol/L — ABNORMAL LOW (ref 135–145)
Total Bilirubin: 0.7 mg/dL (ref 0.3–1.2)
Total Protein: 5.4 g/dL — ABNORMAL LOW (ref 6.5–8.1)

## 2017-07-09 LAB — PHOSPHORUS: Phosphorus: 2.5 mg/dL (ref 2.5–4.6)

## 2017-07-09 LAB — MAGNESIUM: Magnesium: 2 mg/dL (ref 1.7–2.4)

## 2017-07-09 LAB — OCCULT BLOOD X 1 CARD TO LAB, STOOL: Fecal Occult Bld: POSITIVE — AB

## 2017-07-09 MED ORDER — SODIUM CHLORIDE 0.9 % IV BOLUS
1000.0000 mL | Freq: Once | INTRAVENOUS | Status: AC
  Administered 2017-07-09: 1000 mL via INTRAVENOUS

## 2017-07-09 MED ORDER — FAMOTIDINE 20 MG PO TABS
20.0000 mg | ORAL_TABLET | Freq: Two times a day (BID) | ORAL | Status: DC
  Administered 2017-07-09: 20 mg via ORAL
  Filled 2017-07-09 (×2): qty 1

## 2017-07-09 MED ORDER — PANTOPRAZOLE SODIUM 40 MG IV SOLR
40.0000 mg | Freq: Two times a day (BID) | INTRAVENOUS | Status: DC
  Administered 2017-07-09 – 2017-07-14 (×8): 40 mg via INTRAVENOUS
  Filled 2017-07-09 (×11): qty 40

## 2017-07-09 NOTE — Progress Notes (Signed)
Checked patient BP the systolic was in the 01-64'W. Patient was already up in the chair. Was not complaining of dizziness with nausea until standing to use bathroom. This RN and NT sat patient back in the bed. MD made aware of symptomatic hypotension. Orders were put in for 1017ml bolus.  Will continue to monitor.

## 2017-07-09 NOTE — Progress Notes (Signed)
Patient had been c/o of acid-reflux, nausea, gas. Patient was able to get to [facility] and had a large dark stool. Patient states that he feels much better now. Will continue to monitor.

## 2017-07-09 NOTE — Progress Notes (Signed)
Occupational Therapy Treatment Patient Details Name: Alexander Mcdonald MRN: [id number] DOB: Apr 06, 1926 Today's Date: 07/09/2017    History of present illness This 84 year old man was admitted with decreased PO intake, diarrhea and vomiting.  He was recently being treated for pna.  PMH:  prostate CA, CKD, HLD, hypothyroid, HTN.   OT comments  Pt was able to walk to sink but became dizzy in standing. Sat for oral care.  Had spoke to RN prior to tx.  BP was low this am, but he had been up since then and tolerated well.  Sitting BP 101/50.  Pt wanted to remain in chair at the end of session.  Follow Up Recommendations  SNF;Supervision/Assistance - 24 hour    Equipment Recommendations  3 in 1 bedside commode    Recommendations for Other Services      Precautions / Restrictions Precautions Precautions: Fall Restrictions Weight Bearing Restrictions: No       Mobility Bed Mobility         Supine to sit: Min assist;Mod assist     General bed mobility comments: assist for trunk  Transfers   Equipment used: Rolling walker (2 wheeled)   Sit to Stand: Min assist         General transfer comment: assist to rise and steady    Balance     Sitting balance-Leahy Scale: Good                                     ADL either performed or assessed with clinical judgement   ADL       Grooming: Oral care;Minimal assistance;Sitting                                 General ADL Comments: Pt ambulated approximately 4 feet to sink and then felt dizzy. Performed oral care from sitting.  He needed max A to place and hold urinal sitting edge of chair     Vision       Perception     Praxis      Cognition Arousal/Alertness: Awake/alert Behavior During Therapy: WFL for tasks assessed/performed Overall Cognitive Status: Within Functional Limits for tasks assessed                                          Exercises     Shoulder  Instructions       General Comments pt dizzy in standing. Sitting BP was 101/50    Pertinent Vitals/ Pain       Pain Assessment: No/denies pain  Home Living                                          Prior Functioning/Environment              Frequency  Min 2X/week        Progress Toward Goals  OT Goals(current goals can now be found in the care plan section)  Progress towards OT goals: Progressing toward goals     Plan      Co-evaluation  AM-PAC PT "6 Clicks" Daily Activity     Outcome Measure   Help from another person eating meals?: A Little Help from another person taking care of personal grooming?: A Little Help from another person toileting, which includes using toliet, bedpan, or urinal?: A Lot Help from another person bathing (including washing, rinsing, drying)?: A Lot Help from another person to put on and taking off regular upper body clothing?: A Little Help from another person to put on and taking off regular lower body clothing?: A Lot 6 Click Score: 15    End of Session    OT Visit Diagnosis: Unsteadiness on feet (R26.81)   Activity Tolerance (standing limited by BP)   Patient Left in chair;with call bell/phone within reach;with chair alarm set;with family/visitor present   Nurse Communication          Time: 1455-1531 OT Time Calculation (min): 36 min  Charges: OT General Charges $OT Visit: 1 Visit OT Treatments $Self Care/Home Management : 8-22 mins $Therapeutic Activity: 8-22 mins  Lesle Chris, OTR/L [phone] 07/09/2017   [place] 07/09/2017, 3:57 PM

## 2017-07-09 NOTE — Progress Notes (Signed)
PROGRESS NOTE    Alexander Mcdonald  XNA:[id number] DOB: 07-12-1926 DOA: 07/05/2017 PCP: Marletta Lor, MD   Brief Narrative:  Alexander Mcdonald is a 84 y.o. male with medical history significant of hypertension, hyperlipidemia, hypothyroidism and other comorbids who presents with decreased p.o. intake, nausea, vomiting, diarrhea. He was recently seen by his primary care physician and was diagnosed with pneumonia and was started on azithromycin.  No improvement and was seen again by his primary care physician, was switched to Levaquin at the time.  Daughter was at bedside and provided most of the history who states that patient has had decreased p.o. intake, nausea, vomiting, diarrhea since antibiotic initiation.  He also has had productive cough of brown sputum as well as wheezing and shortness of breath.   Daughter says she has also had similar upper respiratory issues which lasted about 3 weeks.  He has had no fevers or chills at home, no chest pain or abdominal pain.  Diarrhea is loose and watery in nature but he is again not had any fevers or abdominal pain associated with diarrhea and now it has resolved. In the ED Labs were obtained and revealed sodium 122, creatinine 2.38, lactic acid 1.7, WBC 9.6.  Chest x-ray was negative for infiltrate.  He was referred for admission for hyponatremia as well as poor p.o. intake and currently is being rehydrated and treated for Acute Bronchitis as well. Appetite remained poor but daughter does not want to try any appetite stimulants. His Renal Function and and Sodium function are improving but patient's appetite still remains poor. Patient was orthostatic today and got dizzy with standing and also complained of Reflux and had a dark stool and Nursing states it was dark black.   Assessment & Plan:   Principal Problem:   Hyponatremia Active Problems:   HYPERCHOLESTEROLEMIA   Essential hypertension   Chronic kidney disease   Hypothyroidism   Nausea and  vomiting  Hyponatremia, improving slowly -Suspect dehydration due to nausea, vomiting, diarrhea and poor PO intake  -Nausea/Vomiting/Diarrhea improving but remains Nauseous still -Improving. Na+ went from 122 on Admission and is now 132 -C/w IVF Rehydration with Normal Saline at a rate of 75 mL/hr again today  -Given 1 Liter of NS bolus  -Diet Liberalized to Regular Diet and Dietician Added Boost Breeze 1 container q24h, along with MVI + Minerals and Magic Cup TID with meals -Encourage po Intake  -Repeat CMP in AM   Nausea/Vomiting/Diarrhea, improved -? Viral Gastroenteritis; No longer Vomiting or having diarrhea but continues to be slightly Nauseous  -Had a large dark stool today and will send for FOBT -C/w Supportive Care and C/w NS at a rate of 75 mL/hr as above -Antiemetics with Zofran 4 mg p.o./IV every 6 as needed for nausea -PT/OT consulted for Generalized Weakness from above and recommending SNF but Daughter wants to PT/OT to   Recent Pneumonia but currently suspect Acute Bronchitis -Treated as outpatient with azithromycin without improvement, switched to levaquin and adaquetely covered  -CXR without acute pulmonary process -?Bronchitis, wheezing heard on exam. No hx of COPD/asthma. Patient quit smoking in his 73s.  -Check Respiratory Virus Panel and place on Droplet Precautions -Started Guaifenesin 1200 mg po BID -Also started DuoNeb 3 mL q6h and will continue -C/w Prednisone burst with 50 mg po Daily x5 days (Day 5/5)  AKI on CKD stage 3, improving  -Baseline Cr ~ 1.1-1.2 and on Admission Cr was 2.38 and trended up to 2.54; Now BUN Cr is  39/1.51 -Avoid Nephrotoxic Medications if possible -Continue IVF Rehydration with NS at a Rate of 75 mL/hr for now  -Continue to Monitor and Repeat CMP in AM   HTN -Hold Amlodipine 2.5 mg po Daily as patient was dizzy this AM and orthostatic   HLD -Continue Atorvastatin 10 mg po qHS  Hypothyroidism -TSH was 2.351 -C/w  Levothyroxine 50 mcg p.o. daily before breakfast  Hx of Prostate Cancer/BPH -C/w Tamsulosin 0.4 mg po Daily  Glaucoma -C/w Latanoprost 0.005% ophthalmic solution 1 drop left eye daily at bedtime, Brinzolamide 1% ophthalmic suspension 1 drop both eyes twice daily daily, Atropine 1% ophthalmic drop 1 drop in the right eye daily at bedtime  Leukocytosis -Likely from Steroid Demargination from Prednisone Administration -WBC went from 9.8 -> 15.4 -> 11.3 -> 11.8 -Continue to Monitor for S/Sx of Infection -Repeat CBC in AM   Orthostatic Hypotension -Away from poor p.o. intake -Patient became dizzy upon standing today -We will rehydrate and give a liter bolus and then continue maintenance IV fluids at 75 cc an hour with normal saline -Repeat orthostatics in a.m. -Per nurse he had a dark stool and nurse tech describes it as Black so we will check for FOBT to ensure the patient is not bleeding and dizzy from loss of blood -Will Start Protonix gtt in the interim  ? Melena -Nursing stated Stool was dark initially and was dark brown and then after talking with the Nurse Tech stated it was dark black but no Bright Red Blood -Will discontinue Maalox for now -Start on po Famotidine 20 mg po BID and IV Protonix 40 mg q12h -Hb/Hct Dropping since admission -Discussed case with GI who will evaluate the patient in the AM  Normocytic Anemia with Suspected Blood loss  -Patient has a had a drop from 14.5/39.2 on admission dropped all the way down to 12.0/34.0 -Likely dilutional drop he has been getting IV fluid resuscitation -We will however need to rule out bleeding and will obtain FOBT and monitor for signs and symptoms of bleeding; Nusring reports he had some Melena but could not be verified because flushed by Nurse Tech -Check Anemia Panel -Repeat CBC in AM  DVT prophylaxis: Enoxaparin 30 mg sq q24h now Discontinued  Code Status: FULL CODE Family Communication: No family present at bedside    Disposition Plan: Anticipate SNF Discharge but will have PT/OT Re-evaluate and discharge after Cleared by Gastroenterology   Consultants:   Gastroenterology    Procedures: None   Antimicrobials:  Anti-infectives (From admission, onward)   None     Subjective: Seen and examined and was laying in bed.  Seen reported that he is extremely dizzy this morning and attempting to stand.  Patient states that he still has a very poor appetite and did not feel as well and felt nauseous.  No chest pain, shortness breath, lightheadedness or dizziness.  Did have some abdominal cramping he states and some reflux.  Later on in afternoon nursing reported that he had a large dark tarry black stool which was not collected for FOBT and was flushed by the nurse tech.  Patient denies any other concerns or complaints at this time  Objective: Vitals:   07/09/17 0853 07/09/17 1017 07/09/17 1130 07/09/17 1355  BP: (!) 90/40 (!) 112/53 (!) 103/47 111/63  Pulse:  79 82 90  Resp:    (!) 22  Temp:    97.7 F (36.5 C)  TempSrc:    Oral  SpO2:    100%  Weight:  Height:        Intake/Output Summary (Last 24 hours) at 07/09/2017 1536 Last data filed at 07/09/2017 1300 Gross per 24 hour  Intake 2781.25 ml  Output 900 ml  Net 1881.25 ml   Filed Weights   07/05/17 1156  Weight: 73 kg (161 lb)   Examination: Physical Exam:  Constitutional: Well-nourished, well-developed elderly Caucasian male who is lying in bed and states that his appetite is very poor and he feels more nauseous today.  Patient also felt dizzy this morning.   Eyes: Sclera anicteric.  Lids and conjunctival normal. ENMT: External ears and nose appear normal.  Slightly hard of hearing.   Neck: Appears supple with no JVD Respiratory: Diminished to auscultation bilaterally no appreciable wheezing, rales, rhonchi.  Breathing is unlabored.  Patient is not using any accessory muscles to breathe and has unlabored breathing Cardiovascular:  Regular rate and rhythm.  Has a loud systolic 3/6 murmur.  Has mild lower extremity edema Abdomen: Soft, nontender, slightly distended.  Has a large central hernia and diastases recti.  Bowel sounds present and slightly hyperactive GU: Deferred Musculoskeletal: No contractures or cyanosis.  No joint deformities Skin: Skin is warm and dry with no appreciable rashes or lesions limited skin evaluation Neurologic: Cranial nerves II through XII grossly intact no appreciable focal deficits. Psychiatric: Normal mood and affect.  Intact judgment insight.  Patient is very pleasant to speak with and is awake, alert and oriented times three-point  Data Reviewed: I have personally reviewed following labs and imaging studies  CBC: Recent Labs  Lab 07/05/17 1216 07/06/17 0544 07/07/17 0559 07/08/17 0537 07/09/17 0526  WBC 9.6 9.8 15.4* 11.3* 11.8*  NEUTROABS 7.0  --  13.1* 9.6* 9.9*  HGB 14.5 14.3 13.2 13.2 12.0*  HCT 39.2 39.1 36.3* 36.4* 34.0*  MCV 82.4 80.8 82.5 83.3 84.4  PLT 216 220 233 222 932   Basic Metabolic Panel: Recent Labs  Lab 07/06/17 0544 07/06/17 1027 07/07/17 0559 07/08/17 0537 07/09/17 0526  NA 126* 127* 128* 132* 132*  K 3.9 4.1 3.9 4.2 4.2  CL 93* 95* 99* 103 105  CO2 20* 21* 21* 22 21*  GLUCOSE 140* 136* 112* 112* 108*  BUN 29* 28* 31* 31* 29*  CREATININE 2.12* 2.10* 2.00* 1.77* 1.51*  CALCIUM 8.7* 8.6* 8.4* 8.4* 8.0*  MG  --   --  1.8 2.1 2.0  PHOS  --   --  3.2 3.0 2.5   GFR: Estimated Creatinine Clearance: 28.8 mL/min (A) (by C-G formula based on SCr of 1.51 mg/dL (H)). Liver Function Tests: Recent Labs  Lab 07/05/17 1216 07/07/17 0559 07/08/17 0537 07/09/17 0526  AST 43* 39 38 35  ALT 43 36 38 39  ALKPHOS 82 59 55 51  BILITOT 2.1* 1.1 0.7 0.7  PROT 7.5 6.2* 5.9* 5.4*  ALBUMIN 4.1 3.3* 3.1* 2.9*   No results for input(s): LIPASE, AMYLASE in the last 168 hours. No results for input(s): AMMONIA in the last 168 hours. Coagulation Profile: No  results for input(s): INR, PROTIME in the last 168 hours. Cardiac Enzymes: Recent Labs  Lab 07/05/17 1216  TROPONINI <0.03   BNP (last 3 results) No results for input(s): PROBNP in the last 8760 hours. HbA1C: No results for input(s): HGBA1C in the last 72 hours. CBG: No results for input(s): GLUCAP in the last 168 hours. Lipid Profile: No results for input(s): CHOL, HDL, LDLCALC, TRIG, CHOLHDL, LDLDIRECT in the last 72 hours. Thyroid Function Tests: No results for input(s):  TSH, T4TOTAL, FREET4, T3FREE, THYROIDAB in the last 72 hours. Anemia Panel: No results for input(s): VITAMINB12, FOLATE, FERRITIN, TIBC, IRON, RETICCTPCT in the last 72 hours. Sepsis Labs: Recent Labs  Lab 07/05/17 1220  LATICACIDVEN 1.70    Recent Results (from the past 240 hour(s))  Culture, Urine     Status: None   Collection Time: 07/05/17  4:57 PM  Result Value Ref Range Status   Specimen Description   Final    URINE, RANDOM Performed at [facility] [address] [place], [place] [address]    Special Requests   Final    NONE Performed at [facility], [place] [address], [place], [place] [address]    Culture   Final    NO GROWTH Performed at [facility], [place] [address], [place],  [address]    Report Status 07/07/2017 FINAL  Final  Respiratory Panel by PCR     Status: None   Collection Time: 07/06/17 10:05 AM  Result Value Ref Range Status   Adenovirus NOT DETECTED NOT DETECTED Final   Coronavirus 229E NOT DETECTED NOT DETECTED Final   Coronavirus HKU1 NOT DETECTED NOT DETECTED Final   Coronavirus NL63 NOT DETECTED NOT DETECTED Final   Coronavirus OC43 NOT DETECTED NOT DETECTED Final   Metapneumovirus NOT DETECTED NOT DETECTED Final   Rhinovirus / Enterovirus NOT DETECTED NOT DETECTED Final   Influenza A NOT DETECTED NOT DETECTED Final   Influenza B NOT DETECTED NOT DETECTED Final   Parainfluenza Virus 1 NOT DETECTED NOT DETECTED Final     Parainfluenza Virus 2 NOT DETECTED NOT DETECTED Final   Parainfluenza Virus 3 NOT DETECTED NOT DETECTED Final   Parainfluenza Virus 4 NOT DETECTED NOT DETECTED Final   Respiratory Syncytial Virus NOT DETECTED NOT DETECTED Final   Bordetella pertussis NOT DETECTED NOT DETECTED Final   Chlamydophila pneumoniae NOT DETECTED NOT DETECTED Final   Mycoplasma pneumoniae NOT DETECTED NOT DETECTED Final     Radiology Studies: Dg Chest Port 1 View  Result Date: 07/08/2017 CLINICAL DATA:  Short of breath.  Follow-up exam. EXAM: PORTABLE CHEST 1 VIEW COMPARISON:  07/07/2017 and older exams. FINDINGS: Opacity noted at the left lateral lung base on the most recent prior study has improved. The original subtle opacity at the left base noted on 06/29/2017 has also improved. Lungs now appear clear. Lungs remain hyperexpanded. No convincing pleural effusion.  No pneumothorax. Heart, mediastinum and hila are unremarkable. IMPRESSION: 1. Improved lung aeration. Left lung base opacity noted on the prior study has essentially resolved consistent with resolved atelectasis. No convincing residual pneumonia. No current acute cardiopulmonary disease. Electronically Signed   By: Lajean Manes M.D.   On: 07/08/2017 07:34   Scheduled Meds: . amLODipine  2.5 mg Oral Daily  . aspirin EC  81 mg Oral Daily  . atorvastatin  10 mg Oral q1800  . atropine  1 drop Right Eye QHS  . brinzolamide  1 drop Both Eyes BID  . chlorhexidine  15 mL Mouth Rinse BID  . enoxaparin (LOVENOX) injection  30 mg Subcutaneous Q24H  . famotidine  20 mg Oral BID  . feeding supplement  1 Container Oral Q24H  . guaiFENesin  1,200 mg Oral BID  . latanoprost  1 drop Left Eye QHS  . levothyroxine  50 mcg Oral QAC breakfast  . mouth rinse  15 mL Mouth Rinse q12n4p  . multivitamin with minerals  1 tablet Oral Daily  . tamsulosin  0.4 mg Oral Daily   Continuous Infusions: . sodium chloride 75 mL/hr at 07/09/17 1043    LOS: 2 days   Kerney Elbe, DO [facility] Pager [phone]  If 7PM-7AM, please contact night-coverage [email or web address] Password [email or web address] 07/09/2017, 3:36 PM

## 2017-07-09 NOTE — Progress Notes (Signed)
PT Cancellation Note  Patient Details Name: Alexander Mcdonald MRN: [id number] DOB: 05-16-1926   Cancelled Treatment:    Reason Eval/Treat Not Completed: Medical issues which prohibited therapy(BP 90/40, orthostatic with nursing staff)   Kenyon Ana 07/09/2017, 9:37 AM

## 2017-07-09 NOTE — Consult Note (Addendum)
Consultation  Referring Provider:  Dr. Alfredia Ferguson    Primary Care Physician:  Marletta Lor, MD Primary Gastroenterologist: Dr. Ardis Hughs          Reason for Consultation: Melena             Impression / Plan:   Impression: 1.  Dark stool: Reported by nursing today, at time of exam dark brown stool found; question true melena versus other 2.  Recent diagnosis of pneumonia: Currently suspected acute bronchitis, on antibiotics as well as prednisone 3.  Anemia: Hemoglobin 13.2--> 12.0 overnight question diutional versus less likely GI bleed 4.  Orthostatic hypotension 5.  Hyponatremia: Suspected due to dehydration nausea/vomiting/diarrhea and poor p.o. intake  Plan: 1.  Currently no plans for immediate GI procedures. 2.  Hemoccult card was sent to the lab at time of exam today 3.  Will follow hemoglobin and symptoms overnight 4.  Please await any further recommendations from Dr. Carlean Purl  Thank you for your kind consultation, we will continue to follow.  Alexander Mcdonald  07/09/2017, 4:01 PM Pager #: [phone]     [facility] Attending   I have a history, reviewed the chart and examined the patient. I agree with the Advanced Practitioner's note, impression and recommendations.   What we saw on rectal was not melena.  Will reassess tomorrow.  I think his problems are multifactorial but mostly related to recent infectious syndrome.  Gatha Mayer, MD, [facility] 07/09/2017 5:35 PM        HPI:   Alexander Mcdonald is a 84 y.o. male with a past medical history as listed below including CAD on aspirin, presented to the hospital 07/05/2017 with decreased p.o. intake, vomiting and diarrhea.  We were consulted today in regards to melena.    Admission patient had recently been diagnosed with pneumonia by his PCP and was started on azithromycin.  He had no improvement with switch to Levaquin.  Apparently when antibiotic started he had decreased p.o.  intake, nausea, vomiting and diarrhea.  Also productive cough with brown sputum as well as wheezing and shortness of breath.    Today, daughter at bedside who helps with history, explains that patient has been feeling unwell for at least a week now, this was prior to being treated with various antibiotics for diagnosed pneumonia.  According to her patient was having diarrhea a week ago and then started with some nausea and vomiting as well as a decrease in appetite.  Currently he is "weaker than normal" and she is "forcing him to eat".  Patient describes some generalized abdominal discomfort at times which is currently "better than it has been a long time".  He tells [place] he has not had an appetite for quite a while.    Patient does tell me he saw the dark stool which was passed earlier this afternoon.  Per nursing this was "black".    Social history positive for living with his daughter.    Denies weight loss or symptoms that awaken him at night.  ED Course: Labs obtained in the emergency department revealed sodium 122, creatinine 2.38, lactic acid 1.7, WBC 9.6.  Chest x-ray was negative for infiltrate.  He was referred for admission for hyponatremia as well as poor p.o. intake.  GI history: 06/23/2008 colonoscopy, Dr. Ardis Hughs: Done for history of adenomatous polyps in January 2009; finding of diminutive polyp in the cecum, small internal hemorrhoids, severe diverticulosis in the sigmoid to descending colon otherwise  normal.  Recall: Was not recommended due to age.  Past Medical History:  Diagnosis Date  . ACUTE RENAL FAILURE W/LESION OF TUBULAR NECROSIS 01/04/2007  . Bladder spasm 06/04/2014  . BPH (benign prostatic hyperplasia) 06/04/2014  . CAROTID ARTERY DISEASE 06/01/2009  . DEPRESSION 07/25/2006  . DIVERTICULOSIS OF COLON 02/01/2007  . Glaucoma   . GOUT 07/25/2006  . HEMORRHOID, THROMBOSED 02/15/2009  . HYPERCHOLESTEROLEMIA 07/25/2006  . HYPERTENSION 07/25/2006  . PROSTATE CANCER, HX OF 07/25/2006  .  Rosacea 09/28/2008  . WEIGHT LOSS 09/01/2009    Past Surgical History:  Procedure Laterality Date  . CAROTID ENDARTERECTOMY    . CATARACT EXTRACTION      Family History:  negative for colon cancer  Social History   Tobacco Use  . Smoking status: Former Smoker    Last attempt to quit: 01/17/1988    Years since quitting: 29.4  . Smokeless tobacco: Never Used  Substance Use Topics  . Alcohol use: Yes    Alcohol/week: 4.2 oz    Types: 7 drink(s) per week    Comment: beer  . Drug use: No    Prior to Admission medications   Medication Sig Start Date End Date Taking? Authorizing Provider  amLODipine (NORVASC) 2.5 MG tablet TAKE 1 TABLET BY MOUTH EVERY DAY 06/18/17  Yes Marletta Lor, MD  aspirin EC 81 MG tablet Take 81 mg by mouth daily.   Yes [provider]  atorvastatin (LIPITOR) 10 MG tablet TAKE 1 TABLET(10 MG) BY MOUTH DAILY 05/17/17  Yes Marletta Lor, MD  atropine 1 % ophthalmic solution Place 1 drop into the right eye at bedtime.  06/26/17  Yes [provider]  AZOPT 1 % ophthalmic suspension Place 1 drop into both eyes 2 (two) times daily.  03/31/14  Yes [provider]  fish oil-omega-3 fatty acids 1000 MG capsule Take 1 g by mouth 2 (two) times daily.    Yes [provider]  fluticasone (FLONASE) 50 MCG/ACT nasal spray Place 2 sprays into both nostrils daily. 08/02/16  Yes Marletta Lor, MD  fosinopril (MONOPRIL) 20 MG tablet TAKE 1 TABLET BY MOUTH EVERY DAY 06/12/17  Yes Marletta Lor, MD  levocetirizine (XYZAL) 5 MG tablet Take 1 tablet (5 mg total) by mouth every evening. 05/11/16  Yes Marletta Lor, MD  levofloxacin (LEVAQUIN) 500 MG tablet Take 1 tablet (500 mg total) by mouth daily. 07/03/17  Yes Marletta Lor, MD  levothyroxine (SYNTHROID, LEVOTHROID) 50 MCG tablet Take 1 tablet (50 mcg total) daily by mouth. 11/30/16  Yes Marletta Lor, MD  Meth-Hyo-M Bl-Na Phos-Ph Sal (URIBEL) 118 MG CAPS  Take 1 capsule by mouth 3 (three) times daily as needed (urinary pain).  06/30/13  Yes [provider]  NONFORMULARY OR COMPOUNDED [place]: Peripheral Neuropathy Cream - Bupivacaine 1%, Doxepin 3%, Gabapentin 6%, Pentoxifylline 3%, Topiramate 1%, apply 1-2 grams to affected area 3-4 times daily. 07/30/15  Yes Trula Slade, DPM  tamsulosin (FLOMAX) 0.4 MG CAPS capsule TAKE ONE CAPSULE BY MOUTH EVERY DAY 06/20/13  Yes Marletta Lor, MD  Travoprost, BAK Free, (TRAVATAN Z) 0.004 % SOLN ophthalmic solution Place 1 drop into the left eye every evening. 07/03/16  Yes [provider]  MYRBETRIQ 25 MG TB24 tablet TAKE 1 TABLET BY MOUTH DAILY Patient not taking: Reported on 07/05/2017 06/25/14   Marletta Lor, MD    Current Facility-Administered Medications  Medication Dose Route Frequency Provider Last Rate Last Dose  .  0.9 %  sodium chloride infusion   Intravenous Continuous Dessa Phi, DO 75 mL/hr at 07/09/17 1043    . acetaminophen (TYLENOL) tablet 650 mg  650 mg Oral Q6H PRN Dessa Phi, DO       Or  . acetaminophen (TYLENOL) suppository 650 mg  650 mg Rectal Q6H PRN Dessa Phi, DO      . amLODipine (NORVASC) tablet 2.5 mg  2.5 mg Oral Daily Dessa Phi, DO   2.5 mg at 07/08/17 1018  . aspirin EC tablet 81 mg  81 mg Oral Daily Dessa Phi, DO   81 mg at 07/09/17 1426  . atorvastatin (LIPITOR) tablet 10 mg  10 mg Oral q1800 Dessa Phi, DO   10 mg at 07/08/17 1713  . atropine 1 % ophthalmic solution 1 drop  1 drop Right Eye QHS Dessa Phi, DO   1 drop at 07/08/17 2350  . brinzolamide (AZOPT) 1 % ophthalmic suspension 1 drop  1 drop Both Eyes BID Dessa Phi, DO   1 drop at 07/09/17 0841  . chlorhexidine (PERIDEX) 0.12 % solution 15 mL  15 mL Mouth Rinse BID Dessa Phi, DO   15 mL at 07/09/17 0839  . famotidine (PEPCID) tablet 20 mg  20 mg Oral BID Sheikh, Omair Latif, DO      . feeding supplement (BOOST / RESOURCE BREEZE)  liquid 1 Container  1 Container Oral Q24H Sheikh, [place], [place]   1 Container at 07/09/17 1429  . guaiFENesin (MUCINEX) 12 hr tablet 1,200 mg  1,200 mg Oral BID [place] Noble Latif, DO   1,200 mg at 07/09/17 1427  . ipratropium-albuterol (DUONEB) 0.5-2.5 (3) MG/3ML nebulizer solution 3 mL  3 mL Nebulization Q4H PRN Sheikh, Omair Latif, DO      . latanoprost (XALATAN) 0.005 % ophthalmic solution 1 drop  1 drop Left Eye QHS Dessa Phi, DO   1 drop at 07/09/17 0001  . levothyroxine (SYNTHROID, LEVOTHROID) tablet 50 mcg  50 mcg Oral QAC breakfast Dessa Phi, DO   50 mcg at 07/09/17 1427  . MEDLINE mouth rinse  15 mL Mouth Rinse q12n4p Dessa Phi, DO   15 mL at 07/09/17 1509  . multivitamin with minerals tablet 1 tablet  1 tablet Oral Daily Raiford Noble [place], DO   1 tablet at 07/09/17 1427  . ondansetron (ZOFRAN) tablet 4 mg  4 mg Oral Q6H PRN Dessa Phi, DO       Or  . ondansetron [facility]) injection 4 mg  4 mg Intravenous Q6H PRN Dessa Phi, DO   4 mg at 07/09/17 1055  . pantoprazole (PROTONIX) injection 40 mg  40 mg Intravenous Q12H Sheikh, Omair [place], DO      . tamsulosin [facility]) capsule 0.4 mg  0.4 mg Oral Daily Dessa Phi, DO   0.4 mg at 07/09/17 1426    Allergies as of 07/05/2017 - Review Complete 07/05/2017  Allergen Reaction Noted  . Shellfish allergy Anaphylaxis 10/28/2011  . Brimonidine Itching 03/17/2014  . Dorzolamide hcl-timolol mal Other (See Comments) 07/30/2012  . Timolol Itching 03/17/2014     Review of Systems:    Constitutional: No weight loss, fever or chills Skin: No rash Cardiovascular: No chest pain   Respiratory: No SOB  Gastrointestinal: See HPI and otherwise negative Genitourinary: No dysuria  Neurological: +dizziness Musculoskeletal: No new muscle or joint pain Hematologic: No bruising Psychiatric: No history of depression or anxiety   Physical Exam:  Vital signs in last 24 hours: Temp:  [97.7 F (  36.5 C)-98.2 F (36.8 C)]  97.7 F (36.5 C) (06/24 1355) Pulse Rate:  [67-108] 90 (06/24 1355) Resp:  [20-22] 22 (06/24 1355) BP: (83-185)/(40-63) 111/63 (06/24 1355) SpO2:  [96 %-100 %] 100 % (06/24 1355) Last BM Date: (pta) General:   Pleasant elderly, ill-appearing Caucasian male appears to be in NAD, Well developed, Well nourished, alert and cooperative Head:  Normocephalic and atraumatic. Eyes:   PEERL, EOMI. No icterus. Conjunctiva pink.+blind right eye Ears:  +HOH Neck:  Supple Throat: Oral cavity and pharynx without inflammation, swelling or lesion. Teeth in good condition. Lungs: Respirations even and unlabored. Lungs clear to auscultation bilaterally.   No wheezes, crackles, or rhonchi.  Heart: Normal S1, S2. +3/6 murmur Regular rate and rhythm. No peripheral edema, cyanosis or pallor.  Abdomen:  Soft, nondistended, nontender. No rebound or guarding. Normal bowel sounds. No appreciable masses or hepatomegaly. Rectal:  External; hemorrhoid tag; Internal: no mass; Dark brown stool-hemoccult sent Msk:  Symmetrical without gross deformities. Peripheral pulses intact.  Extremities:  Mild lower extremity edema, Normal ROM, normal sensation. Neurologic:  Alert and  oriented x4;  grossly normal neurologically.  Skin:   Dry and intact without significant lesions or rashes. Psychiatric: Demonstrates good judgement and reason without abnormal affect or behaviors.   LAB RESULTS: Recent Labs    07/07/17 0559 07/08/17 0537 07/09/17 0526  WBC 15.4* 11.3* 11.8*  HGB 13.2 13.2 12.0*  HCT 36.3* 36.4* 34.0*  PLT 233 222 219   BMET Recent Labs    07/07/17 0559 07/08/17 0537 07/09/17 0526  NA 128* 132* 132*  K 3.9 4.2 4.2  CL 99* 103 105  CO2 21* 22 21*  GLUCOSE 112* 112* 108*  BUN 31* 31* 29*  CREATININE 2.00* 1.77* 1.51*  CALCIUM 8.4* 8.4* 8.0*   LFT Recent Labs    07/09/17 0526  PROT 5.4*  ALBUMIN 2.9*  AST 35  ALT 39  ALKPHOS 51  BILITOT 0.7   STUDIES: Dg Chest Port 1 View  Result Date:  07/08/2017 CLINICAL DATA:  Short of breath.  Follow-up exam. EXAM: PORTABLE CHEST 1 VIEW COMPARISON:  07/07/2017 and older exams. FINDINGS: Opacity noted at the left lateral lung base on the most recent prior study has improved. The original subtle opacity at the left base noted on 06/29/2017 has also improved. Lungs now appear clear. Lungs remain hyperexpanded. No convincing pleural effusion.  No pneumothorax. Heart, mediastinum and hila are unremarkable. IMPRESSION: 1. Improved lung aeration. Left lung base opacity noted on the prior study has essentially resolved consistent with resolved atelectasis. No convincing residual pneumonia. No current acute cardiopulmonary disease. Electronically Signed   By: Lajean Manes M.D.   On: 07/08/2017 07:34

## 2017-07-09 NOTE — Progress Notes (Signed)
Pt c/o of burning with urination. MD paged.

## 2017-07-10 ENCOUNTER — Telehealth: Payer: Self-pay | Admitting: *Deleted

## 2017-07-10 ENCOUNTER — Ambulatory Visit: Payer: Medicare Other | Admitting: Internal Medicine

## 2017-07-10 DIAGNOSIS — G43A0 Cyclical vomiting, not intractable: Secondary | ICD-10-CM

## 2017-07-10 DIAGNOSIS — R195 Other fecal abnormalities: Secondary | ICD-10-CM

## 2017-07-10 DIAGNOSIS — R630 Anorexia: Secondary | ICD-10-CM

## 2017-07-10 LAB — CBC WITH DIFFERENTIAL/PLATELET
Basophils Absolute: 0 10*3/uL (ref 0.0–0.1)
Basophils Relative: 0 %
Eosinophils Absolute: 0 10*3/uL (ref 0.0–0.7)
Eosinophils Relative: 0 %
HCT: 24.6 % — ABNORMAL LOW (ref 39.0–52.0)
Hemoglobin: 8.6 g/dL — ABNORMAL LOW (ref 13.0–17.0)
Lymphocytes Relative: 12 %
Lymphs Abs: 1.3 10*3/uL (ref 0.7–4.0)
MCH: 30.5 pg (ref 26.0–34.0)
MCHC: 35 g/dL (ref 30.0–36.0)
MCV: 87.2 fL (ref 78.0–100.0)
Monocytes Absolute: 0.5 10*3/uL (ref 0.1–1.0)
Monocytes Relative: 5 %
Neutro Abs: 8.7 10*3/uL (ref 1.7–7.7)
Neutrophils Relative %: 83 %
Platelets: 229 10*3/uL (ref 150–400)
RBC: 2.82 MIL/uL — ABNORMAL LOW (ref 4.22–5.81)
RDW: 14.8 % (ref 11.5–15.5)
WBC: 10.5 10*3/uL (ref 4.0–10.5)

## 2017-07-10 LAB — URINALYSIS, ROUTINE W REFLEX MICROSCOPIC
Bacteria, UA: NONE SEEN
Bilirubin Urine: NEGATIVE
Glucose, UA: NEGATIVE mg/dL
Hgb urine dipstick: NEGATIVE
Ketones, ur: NEGATIVE mg/dL
Nitrite: NEGATIVE
Protein, ur: NEGATIVE mg/dL
Specific Gravity, Urine: 1.014 (ref 1.005–1.030)
pH: 5 (ref 5.0–8.0)

## 2017-07-10 LAB — COMPREHENSIVE METABOLIC PANEL
ALT: 30 U/L (ref 0–44)
AST: 24 U/L (ref 15–41)
Albumin: 2.6 g/dL — ABNORMAL LOW (ref 3.5–5.0)
Alkaline Phosphatase: 40 U/L (ref 38–126)
Anion gap: 4 — ABNORMAL LOW (ref 5–15)
BUN: 71 mg/dL — ABNORMAL HIGH (ref 8–23)
CO2: 21 mmol/L — ABNORMAL LOW (ref 22–32)
Calcium: 8.2 mg/dL — ABNORMAL LOW (ref 8.9–10.3)
Chloride: 111 mmol/L (ref 98–111)
Creatinine, Ser: 1.87 mg/dL — ABNORMAL HIGH (ref 0.61–1.24)
GFR calc Af Amer: 35 mL/min — ABNORMAL LOW (ref >60)
GFR calc non Af Amer: 30 mL/min — ABNORMAL LOW (ref >60)
Glucose, Bld: 136 mg/dL — ABNORMAL HIGH (ref 70–99)
Potassium: 4.7 mmol/L (ref 3.5–5.1)
Sodium: 136 mmol/L (ref 135–145)
Total Bilirubin: 0.4 mg/dL (ref 0.3–1.2)
Total Protein: 4.7 g/dL — ABNORMAL LOW (ref 6.5–8.1)

## 2017-07-10 LAB — IRON AND TIBC
Iron: 128 ug/dL (ref 45–182)
Saturation Ratios: 65 % — ABNORMAL HIGH (ref 17.9–39.5)
TIBC: 195 ug/dL — ABNORMAL LOW (ref 250–450)
UIBC: 67 ug/dL

## 2017-07-10 LAB — VITAMIN B12: Vitamin B-12: 834 pg/mL (ref 180–914)

## 2017-07-10 LAB — FERRITIN: Ferritin: 299 ng/mL (ref 24–336)

## 2017-07-10 LAB — FOLATE: Folate: 6.8 ng/mL (ref >5.9)

## 2017-07-10 LAB — MAGNESIUM: Magnesium: 2.2 mg/dL (ref 1.7–2.4)

## 2017-07-10 LAB — RETICULOCYTES
RBC.: 2.82 MIL/uL — ABNORMAL LOW (ref 4.22–5.81)
Retic Count, Absolute: 90.2 10*3/uL (ref 19.0–186.0)
Retic Ct Pct: 3.2 % — ABNORMAL HIGH (ref 0.4–3.1)

## 2017-07-10 LAB — HEMOGLOBIN AND HEMATOCRIT, BLOOD
HCT: 23.1 % — ABNORMAL LOW (ref 39.0–52.0)
Hemoglobin: 7.7 g/dL — ABNORMAL LOW (ref 13.0–17.0)

## 2017-07-10 LAB — PHOSPHORUS: Phosphorus: 2.8 mg/dL (ref 2.5–4.6)

## 2017-07-10 NOTE — Progress Notes (Signed)
PROGRESS NOTE    Alexander Mcdonald  QIW:[id number] DOB: Nov 28, 1926 DOA: 07/05/2017 PCP: Marletta Lor, MD   Brief Narrative:  Alexander Mcdonald is a 84 y.o. male with medical history significant of hypertension, hyperlipidemia, hypothyroidism and other comorbids who presents with decreased p.o. intake, nausea, vomiting, diarrhea. He was recently seen by his primary care physician and was diagnosed with pneumonia and was started on azithromycin.  No improvement and was seen again by his primary care physician, was switched to Levaquin at the time.  Daughter who was at bedside and provided most of the history who states that patient has had decreased p.o. intake, nausea, vomiting, diarrhea since antibiotic initiation.  He also has had productive cough of brown sputum as well as wheezing and shortness of breath.   Daughter says she has also had similar upper respiratory issues which lasted about 3 weeks.  He has had no fevers or chills at home, no chest pain or abdominal pain.  Diarrhea was loose and watery in nature but he is again not had any fevers or abdominal pain associated with diarrhea and now it has resolved. In the ED Labs were obtained and revealed sodium 122, creatinine 2.38, lactic acid 1.7, WBC 9.6.  Chest x-ray was negative for infiltrate.    He was referred for admission for hyponatremia as well as poor p.o. intake and currently is being rehydrated and treated for Acute Bronchitis as well. Appetite remained poor but daughter does not want to try any appetite stimulants. His Renal Function and and Sodium function were improving but patient's appetite still remained poor. Patient was orthostatic yesterday and got dizzy with standing and also complained of Reflux and had a dark stool and Nursing states it was dark black. FOBT was positive and Hb/Hct dropped and suspecting Upper GIB. GI Consulted and recommending EGD in AM.  Assessment & Plan:   Principal Problem:   Hyponatremia Active  Problems:   HYPERCHOLESTEROLEMIA   Essential hypertension   Chronic kidney disease   Hypothyroidism   Nausea and vomiting  Hyponatremia, improved -Suspect dehydration due to nausea, vomiting, diarrhea and poor PO intake  -Nausea/Vomiting/Diarrhea improving but remains Nauseous still -Improving. Na+ went from 122 on Admission and is now 136 -C/w IVF Rehydration with Normal Saline but will reduce rate from 75 mL/hr to 50 mL/hr  -Given 1 Liter of NS bolus  -Diet Liberalized to Regular Diet and Dietician Added Boost Breeze 1 container q24h, along with MVI + Minerals and Magic Cup TID with meals -Encourage po Intake  -Repeat CMP in AM   Nausea/Vomiting/Diarrhea, improved -? Viral Gastroenteritis; No longer Vomiting or having diarrhea but continues to be slightly Nauseous  -Had a large dark stool today and will send for FOBT; FOBT was Positive  -C/w Supportive Care and C/w NS at a rate of 75 mL/hr as above -Antiemetics with Zofran 4 mg p.o./IV every 6 as needed for nausea -PT/OT consulted for Generalized Weakness from above and recommending SNF but Daughter wants to PT/OT to re-evaluate   Recent Pneumonia but currently suspect Acute Bronchitis -Treated as outpatient with azithromycin without improvement, switched to levaquin and adaquetely covered  -CXR without acute pulmonary process -?Bronchitis, wheezing heard on exam. No hx of COPD/asthma. Patient quit smoking in his 54s.  -Check Respiratory Virus Panel and place on Droplet Precautions -Started Guaifenesin 1200 mg po BID -Also started DuoNeb 3 mL q6h and will continue -S/p Prednisone burst with 50 mg po Daily x5 days   AKI on  CKD stage 3, improving  -Baseline Cr ~ 1.1-1.2 and on Admission Cr was 2.38 and trended up to 2.54; Now BUN Cr went to 39/1.51 but after bloody bowel movement increased to 71/1.87 -Avoid Nephrotoxic Medications if possible -Continue IVF Rehydration with NS at a Rate of 71mL/hr for now  -Continue to Monitor  and Repeat CMP in AM   HTN -Continue to Hold Amlodipine 2.5 mg po Daily as patient was dizzy yesterday AM and orthostatic and now has most likely an Upper GIB  HLD -Continue Atorvastatin 10 mg po qHS  Hypothyroidism -TSH was 2.351 -C/w Levothyroxine 50 mcg p.o. daily before breakfast  Hx of Prostate Cancer/BPH -C/w Tamsulosin 0.4 mg po Daily  Glaucoma -C/w Latanoprost 0.005% ophthalmic solution 1 drop left eye daily at bedtime, Brinzolamide 1% ophthalmic suspension 1 drop both eyes twice daily daily, Atropine 1% ophthalmic drop 1 drop in the right eye daily at bedtime  Leukocytosis -Likely from Steroid Demargination from Prednisone Administration -WBC went from 9.8 -> 15.4 -> 11.3 -> 11.8 -> 10.5 -Continue to Monitor for S/Sx of Infection -Repeat CBC in AM   Orthostatic Hypotension -Away from poor p.o. intake -Patient became dizzy upon standing today -We will rehydrate and give a liter bolus and then continue maintenance IV fluids at 75 cc an hour with normal saline -Repeat orthostatics in a.m. -Per nurse he had a dark stool and nurse tech describes it as Black so we will check for FOBT to ensure the patient is not bleeding and dizzy from loss of blood -Will Start Protonix gtt in the interim  Melena in the setting of a Upper GIB -Nursing stated Stool was dark initially and was dark brown and then after talking with the Nurse Tech stated it was dark black but no Bright Red Blood -Will discontinue Maalox for now -Stared on po Famotidine 20 mg po BID yesterday and IV Protonix 40 mg q12h; Famotidine now stopped  -Hb/Hct Dropping since admission and acutely worsened and dropped to 8.6/24.6 -Repeat Hb/Hct this Afternoon pending -Gastroenterology consulted and evaluated and have scheduled EGD for tomorrow afternoon at 2:00 pm; NPO after midnight  -Continue to Monitor Hb/Hct and transfuse if Hb <7.0  Normocytic Anemia with Acute Blood loss  -Patient has a had a drop from  14.5/39.2 on admission dropped all the way down to 12.0/34.0 yesterday and was 8.6/24.6 this AM  -Likely initially felt to be dilutional drop he has been getting IV fluid resuscitation but now feel its from blood loss  -FOBT was Positive -Continue to Monitor for signs and symptoms of bleeding; Nusring reported he had some Melena but has not had any more bloody bowel movements since -Checked Anemia Panel and showed iron level 128, UIBC 67, TIBC 195, saturation ratios of 65%, ferritin level 299, folate level of 6.8, and vitamin B12 level of 834 -Repeat Hb/Hct this afternoon and CBC in AM -Transfuse for Hb <7.0  DVT prophylaxis: Enoxaparin 30 mg sq q24h now Discontinued  Code Status: FULL CODE Family Communication: Discussed with Daughter at bedside  Disposition Plan: Anticipate SNF Discharge but will have PT/OT Re-evaluate and discharge after Cleared by Gastroenterology   Consultants:   Gastroenterology    Procedures: None   Antimicrobials:  Anti-infectives (From admission, onward)   None     Subjective: Seen and examined and and was still somewhat nauseous.  No chest pain, lightheadedness or dizziness today.  States he has had no further bloody bowel movements at this time but has not had a  bowel movement.  No other complaints or concerns at this time.  Objective: Vitals:   07/09/17 1355 07/09/17 2008 07/10/17 0523 07/10/17 1502  BP: 111/63 (!) 129/57 (!) 109/52 (!) 108/49  Pulse: 90 89 79 92  Resp: (!) 22 18 16    Temp: 97.7 F (36.5 C) 98 F (36.7 C) 98 F (36.7 C) (!) 97.5 F (36.4 C)  TempSrc: Oral Oral Oral Oral  SpO2: 100% 97% 98% 100%  Weight:      Height:        Intake/Output Summary (Last 24 hours) at 07/10/2017 1547 Last data filed at 07/10/2017 1300 Gross per 24 hour  Intake 360 ml  Output 650 ml  Net -290 ml   Filed Weights   07/05/17 1156  Weight: 73 kg (161 lb)   Examination: Physical Exam:  Constitutional: Well-nourished, well-developed elderly  Caucasian male who is currently in no acute distress but feels slightly nauseous again this morning.  Not as dizzy per patient is not complaining of any abdominal pain today Eyes: Sclera anicteric.  Lids and conjunctive normal ENMT: External ears and nose appear normal.  Slightly hard of hearing Neck: Supple with no JVD Respiratory: Diminished to auscultation bilaterally no appreciable wheezing, rales, rhonchi.  Breathing is unlabored.  Patient is not using accessory muscles to breathe and has unlabored breathing Cardiovascular: Regular rate and rhythm.  Has a loud systolic 3/6 murmur.  Trace lower extremity edema Abdomen: Soft, nontender, slightly distended.  Has a large central hernia and diastases recti.  Bowel sounds present and hyperactive slightly GU: Deferred Musculoskeletal: No contractures or cyanosis.  No joint deformities Skin: Skin is warm and dry with no appreciable rashes or lesions limited skin evaluation Neurologic: Renal nerves II through XII grossly intact no appreciable focal deficits Psychiatric: Normal mood and affect.  Intact judgment insight.  Patient is very pleasant to speak to and is awake, alert, and oriented x3  Data Reviewed: I have personally reviewed following labs and imaging studies  CBC: Recent Labs  Lab 07/05/17 1216 07/06/17 0544 07/07/17 0559 07/08/17 0537 07/09/17 0526 07/10/17 0557  WBC 9.6 9.8 15.4* 11.3* 11.8* 10.5  NEUTROABS 7.0  --  13.1* 9.6* 9.9* 8.7  HGB 14.5 14.3 13.2 13.2 12.0* 8.6*  HCT 39.2 39.1 36.3* 36.4* 34.0* 24.6*  MCV 82.4 80.8 82.5 83.3 84.4 87.2  PLT 216 220 233 222 219 621   Basic Metabolic Panel: Recent Labs  Lab 07/06/17 1027 07/07/17 0559 07/08/17 0537 07/09/17 0526 07/10/17 0557  NA 127* 128* 132* 132* 136  K 4.1 3.9 4.2 4.2 4.7  CL 95* 99* 103 105 111  CO2 21* 21* 22 21* 21*  GLUCOSE 136* 112* 112* 108* 136*  BUN 28* 31* 31* 29* 71*  CREATININE 2.10* 2.00* 1.77* 1.51* 1.87*  CALCIUM 8.6* 8.4* 8.4* 8.0* 8.2*    MG  --  1.8 2.1 2.0 2.2  PHOS  --  3.2 3.0 2.5 2.8   GFR: Estimated Creatinine Clearance: 23.3 mL/min (A) (by C-G formula based on SCr of 1.87 mg/dL (H)). Liver Function Tests: Recent Labs  Lab 07/05/17 1216 07/07/17 0559 07/08/17 0537 07/09/17 0526 07/10/17 0557  AST 43* 39 38 35 24  ALT 43 36 38 39 30  ALKPHOS 82 59 55 51 40  BILITOT 2.1* 1.1 0.7 0.7 0.4  PROT 7.5 6.2* 5.9* 5.4* 4.7*  ALBUMIN 4.1 3.3* 3.1* 2.9* 2.6*   No results for input(s): LIPASE, AMYLASE in the last 168 hours. No results for input(s): AMMONIA  in the last 168 hours. Coagulation Profile: No results for input(s): INR, PROTIME in the last 168 hours. Cardiac Enzymes: Recent Labs  Lab 07/05/17 1216  TROPONINI <0.03   BNP (last 3 results) No results for input(s): PROBNP in the last 8760 hours. HbA1C: No results for input(s): HGBA1C in the last 72 hours. CBG: No results for input(s): GLUCAP in the last 168 hours. Lipid Profile: No results for input(s): CHOL, HDL, LDLCALC, TRIG, CHOLHDL, LDLDIRECT in the last 72 hours. Thyroid Function Tests: No results for input(s): TSH, T4TOTAL, FREET4, T3FREE, THYROIDAB in the last 72 hours. Anemia Panel: Recent Labs    07/10/17 0557  VITAMINB12 834  FOLATE 6.8  FERRITIN 299  TIBC 195*  IRON 128  RETICCTPCT 3.2*   Sepsis Labs: Recent Labs  Lab 07/05/17 1220  LATICACIDVEN 1.70    Recent Results (from the past 240 hour(s))  Culture, Urine     Status: None   Collection Time: 07/05/17  4:57 PM  Result Value Ref Range Status   Specimen Description   Final    URINE, RANDOM Performed at [facility] [address] [place], [place] [address]    Special Requests   Final    NONE Performed at [facility], [place] [address] [place], [place] [address]    Culture   Final    NO GROWTH Performed at [facility], [place] [address], [place], [place] [address]    Report Status 07/07/2017 FINAL  Final  Respiratory  Panel by PCR     Status: None   Collection Time: 07/06/17 10:05 AM  Result Value Ref Range Status   Adenovirus NOT DETECTED NOT DETECTED Final   Coronavirus 229E NOT DETECTED NOT DETECTED Final   Coronavirus HKU1 NOT DETECTED NOT DETECTED Final   Coronavirus NL63 NOT DETECTED NOT DETECTED Final   Coronavirus OC43 NOT DETECTED NOT DETECTED Final   Metapneumovirus NOT DETECTED NOT DETECTED Final   Rhinovirus / Enterovirus NOT DETECTED NOT DETECTED Final   Influenza A NOT DETECTED NOT DETECTED Final   Influenza B NOT DETECTED NOT DETECTED Final   Parainfluenza Virus 1 NOT DETECTED NOT DETECTED Final   Parainfluenza Virus 2 NOT DETECTED NOT DETECTED Final   Parainfluenza Virus 3 NOT DETECTED NOT DETECTED Final   Parainfluenza Virus 4 NOT DETECTED NOT DETECTED Final   Respiratory Syncytial Virus NOT DETECTED NOT DETECTED Final   Bordetella pertussis NOT DETECTED NOT DETECTED Final   Chlamydophila pneumoniae NOT DETECTED NOT DETECTED Final   Mycoplasma pneumoniae NOT DETECTED NOT DETECTED Final    Radiology Studies: No results found. Scheduled Meds: . amLODipine  2.5 mg Oral Daily  . atorvastatin  10 mg Oral q1800  . atropine  1 drop Right Eye QHS  . brinzolamide  1 drop Both Eyes BID  . chlorhexidine  15 mL Mouth Rinse BID  . feeding supplement  1 Container Oral Q24H  . guaiFENesin  1,200 mg Oral BID  . latanoprost  1 drop Left Eye QHS  . levothyroxine  50 mcg Oral QAC breakfast  . mouth rinse  15 mL Mouth Rinse q12n4p  . multivitamin with minerals  1 tablet Oral Daily  . pantoprazole (PROTONIX) IV  40 mg Intravenous Q12H  . tamsulosin  0.4 mg Oral Daily   Continuous Infusions: . sodium chloride 75 mL/hr at 07/10/17 1053    LOS: 3 days   Kerney Elbe, DO [facility] Pager [phone]  If 7PM-7AM, please contact night-coverage [email or web address]  Password [email or web address] 07/10/2017, 3:47 PM

## 2017-07-10 NOTE — Telephone Encounter (Signed)
Copied from [place] [id number]. Topic: Quick Communication - Appointment Cancellation >> Jul 10, 2017  1:04 PM Synthia Innocent wrote: Patient called to cancel appointment scheduled for 07/10/17. Patient has not rescheduled their appointment. Patient is in the hospital  Route to department's PEC pool.

## 2017-07-10 NOTE — Progress Notes (Signed)
PT Cancellation Note  Patient Details Name: JAIDEEP POLLACK MRN: [id number] DOB: 1926/11/12   Cancelled Treatment:    Reason Eval/Treat Not Completed: Other (comment)Planed EGD tomorrow. Hgb being watched.  Will continue PT after procedure.  Claretha Cooper 07/10/2017, 4:00 PM  Tresa Endo PT [phone]

## 2017-07-10 NOTE — Care Management Important Message (Signed)
Important Message  Patient Details  Name: Alexander Mcdonald MRN: [id number] Date of Birth: [birth year]   Medicare Important Message Given:  Yes    Kerin Salen 07/10/2017, 12:20 [place] Message  Patient Details  Name: Alexander Mcdonald MRN: [id number] Date of Birth: [birth year]   Medicare Important Message Given:  Yes    Kerin Salen 07/10/2017, 12:20 PM

## 2017-07-10 NOTE — H&P (View-Only) (Signed)
    Progress Note    Assessment / Plan:   Assessment: 1.  Dark stool: 07/09/2017, fecal occult positive yesterday, hemoglobin 12.0--> 8.6 overnight, suspect upper GI bleed 2.  Recent diagnosis of pneumonia: On antibiotics 3.  Anemia: See above 4.  Orthostatic hypotension 5.  Hyponatremia: Better today  Plan: 1.  Hemoglobin dropped to 8 overnight, EGD scheduled for tomorrow with Dr. Carlean Purl at 2 PM.  Did discuss risk, benefits, limitations and alternatives and the patient agrees to proceed. 2.  Patient will be n.p.o. after midnight 3.  Continue other supportive measures 4.  Continue to check hemoglobin and transfusion as needed less than 7   Thank you for your kind consultation, we will continue to follow   LOS: 3 days   Levin Erp  07/10/2017, 10:39 AM  Pager # [phone]    [facility] Attending   I have taken a history, reviewed the chart and examined the patient. I agree with the Advanced Practitioner's note, impression and recommendations.    Gatha Mayer, MD, [facility] 07/10/2017 2:15 PM   Subjective  Chief Complaint: Melena, anemia  This morning patient is found sitting up in bed, complains of some epigastric pain.  Continues with decreased appetite.  No further black stools per nursing or patient.    Objective   Vital signs in last 24 hours: Temp:  [97.7 F (36.5 C)-98 F (36.7 C)] 98 F (36.7 C) (06/25 0523) Pulse Rate:  [79-90] 79 (06/25 0523) Resp:  [16-22] 16 (06/25 0523) BP: (103-129)/(47-63) 109/52 (06/25 0523) SpO2:  [97 %-100 %] 98 % (06/25 0523) Last BM Date: 07/09/17(per Danie Binder during report.) General:    Elderly white male in NAD Heart:  Regular rate and rhythm; no murmurs Lungs: Respirations even and unlabored, lungs CTA bilaterally Abdomen:  Soft, mild epigastirc ttp and nondistended. Normal bowel sounds. Extremities:  Without edema. Neurologic:  Alert and oriented,  grossly normal  neurologically. Psych:  Cooperative. Normal mood and affect.  Intake/Output from previous day: 06/24 0701 - 06/25 0700 In: 240 [P.O.:240] Out: 600 [Urine:600] Intake/Output this shift: Total I/O In: 120 [P.O.:120] Out: 300 [Urine:300]  Lab Results: Recent Labs    07/08/17 0537 07/09/17 0526 07/10/17 0557  WBC 11.3* 11.8* 10.5  HGB 13.2 12.0* 8.6*  HCT 36.4* 34.0* 24.6*  PLT 222 219 229   BMET Recent Labs    07/08/17 0537 07/09/17 0526 07/10/17 0557  NA 132* 132* 136  K 4.2 4.2 4.7  CL 103 105 111  CO2 22 21* 21*  GLUCOSE 112* 108* 136*  BUN 31* 29* 71*  CREATININE 1.77* 1.51* 1.87*  CALCIUM 8.4* 8.0* 8.2*   LFT Recent Labs    07/10/17 0557  PROT 4.7*  ALBUMIN 2.6*  AST 24  ALT 30  ALKPHOS 40  BILITOT 0.4

## 2017-07-10 NOTE — Progress Notes (Addendum)
    Progress Note    Assessment / Plan:   Assessment: 1.  Dark stool: 07/09/2017, fecal occult positive yesterday, hemoglobin 12.0--> 8.6 overnight, suspect upper GI bleed 2.  Recent diagnosis of pneumonia: On antibiotics 3.  Anemia: See above 4.  Orthostatic hypotension 5.  Hyponatremia: Better today  Plan: 1.  Hemoglobin dropped to 8 overnight, EGD scheduled for tomorrow with Dr. Carlean Purl at 2 PM.  Did discuss risk, benefits, limitations and alternatives and the patient agrees to proceed. 2.  Patient will be n.p.o. after midnight 3.  Continue other supportive measures 4.  Continue to check hemoglobin and transfusion as needed less than 7   Thank you for your kind consultation, we will continue to follow   LOS: 3 days   Alexander Mcdonald  07/10/2017, 10:39 AM  Pager # [phone]    [facility] Attending   I have taken a history, reviewed the chart and examined the patient. I agree with the Advanced Practitioner's note, impression and recommendations.    Gatha Mayer, MD, [facility] 07/10/2017 2:15 PM   Subjective  Chief Complaint: Melena, anemia  This morning patient is found sitting up in bed, complains of some epigastric pain.  Continues with decreased appetite.  No further black stools per nursing or patient.    Objective   Vital signs in last 24 hours: Temp:  [97.7 F (36.5 C)-98 F (36.7 C)] 98 F (36.7 C) (06/25 0523) Pulse Rate:  [79-90] 79 (06/25 0523) Resp:  [16-22] 16 (06/25 0523) BP: (103-129)/(47-63) 109/52 (06/25 0523) SpO2:  [97 %-100 %] 98 % (06/25 0523) Last BM Date: 07/09/17(per Danie Binder during report.) General:    Elderly white male in NAD Heart:  Regular rate and rhythm; no murmurs Lungs: Respirations even and unlabored, lungs CTA bilaterally Abdomen:  Soft, mild epigastirc ttp and nondistended. Normal bowel sounds. Extremities:  Without edema. Neurologic:  Alert and oriented,  grossly normal  neurologically. Psych:  Cooperative. Normal mood and affect.  Intake/Output from previous day: 06/24 0701 - 06/25 0700 In: 240 [P.O.:240] Out: 600 [Urine:600] Intake/Output this shift: Total I/O In: 120 [P.O.:120] Out: 300 [Urine:300]  Lab Results: Recent Labs    07/08/17 0537 07/09/17 0526 07/10/17 0557  WBC 11.3* 11.8* 10.5  HGB 13.2 12.0* 8.6*  HCT 36.4* 34.0* 24.6*  PLT 222 219 229   BMET Recent Labs    07/08/17 0537 07/09/17 0526 07/10/17 0557  NA 132* 132* 136  K 4.2 4.2 4.7  CL 103 105 111  CO2 22 21* 21*  GLUCOSE 112* 108* 136*  BUN 31* 29* 71*  CREATININE 1.77* 1.51* 1.87*  CALCIUM 8.4* 8.0* 8.2*   LFT Recent Labs    07/10/17 0557  PROT 4.7*  ALBUMIN 2.6*  AST 24  ALT 30  ALKPHOS 40  BILITOT 0.4

## 2017-07-10 NOTE — Telephone Encounter (Signed)
Noted Will send to Dr Raliegh Ip as Juluis Rainier.  Pt is currently admitted so will be followed up with once d/c'd home.

## 2017-07-11 ENCOUNTER — Encounter (HOSPITAL_COMMUNITY)
Admission: EM | Disposition: A | Discharge status: Home Health | Payer: Self-pay | Source: Home / Self Care | Attending: Internal Medicine

## 2017-07-11 ENCOUNTER — Encounter (HOSPITAL_COMMUNITY): Payer: Self-pay | Admitting: *Deleted

## 2017-07-11 ENCOUNTER — Inpatient Hospital Stay (HOSPITAL_COMMUNITY): Payer: Medicare Other | Admitting: Certified Registered Nurse Anesthetist

## 2017-07-11 DIAGNOSIS — K208 Other esophagitis: Secondary | ICD-10-CM

## 2017-07-11 DIAGNOSIS — K264 Chronic or unspecified duodenal ulcer with hemorrhage: Secondary | ICD-10-CM

## 2017-07-11 DIAGNOSIS — D62 Acute posthemorrhagic anemia: Secondary | ICD-10-CM

## 2017-07-11 DIAGNOSIS — K922 Gastrointestinal hemorrhage, unspecified: Secondary | ICD-10-CM

## 2017-07-11 DIAGNOSIS — K269 Duodenal ulcer, unspecified as acute or chronic, without hemorrhage or perforation: Secondary | ICD-10-CM

## 2017-07-11 HISTORY — PX: BIOPSY: SHX5522

## 2017-07-11 HISTORY — PX: ESOPHAGOGASTRODUODENOSCOPY (EGD) WITH PROPOFOL: SHX5813

## 2017-07-11 HISTORY — PX: BRONCHIAL BRUSHINGS: SHX5108

## 2017-07-11 LAB — CBC WITH DIFFERENTIAL/PLATELET
Basophils Absolute: 0 10*3/uL (ref 0.0–0.1)
Basophils Relative: 0 %
Eosinophils Absolute: 0 10*3/uL (ref 0.0–0.7)
Eosinophils Relative: 0 %
HCT: 20.7 % — ABNORMAL LOW (ref 39.0–52.0)
Hemoglobin: 7 g/dL — ABNORMAL LOW (ref 13.0–17.0)
Lymphocytes Relative: 17 %
Lymphs Abs: 2.5 10*3/uL (ref 0.7–4.0)
MCH: 30.8 pg (ref 26.0–34.0)
MCHC: 33.8 g/dL (ref 30.0–36.0)
MCV: 91.2 fL (ref 78.0–100.0)
Monocytes Absolute: 0.7 10*3/uL (ref 0.1–1.0)
Monocytes Relative: 5 %
Neutro Abs: 11.6 10*3/uL — ABNORMAL HIGH (ref 1.7–7.7)
Neutrophils Relative %: 78 %
Platelets: 210 10*3/uL (ref 150–400)
RBC: 2.27 MIL/uL — ABNORMAL LOW (ref 4.22–5.81)
RDW: 15.3 % (ref 11.5–15.5)
WBC: 14.8 10*3/uL — ABNORMAL HIGH (ref 4.0–10.5)

## 2017-07-11 LAB — URINE CULTURE: Culture: <10000 — AB

## 2017-07-11 LAB — COMPREHENSIVE METABOLIC PANEL
ALT: 25 U/L (ref 0–44)
AST: 24 U/L (ref 15–41)
Albumin: 2.5 g/dL — ABNORMAL LOW (ref 3.5–5.0)
Alkaline Phosphatase: 34 U/L — ABNORMAL LOW (ref 38–126)
Anion gap: 5 (ref 5–15)
BUN: 82 mg/dL — ABNORMAL HIGH (ref 8–23)
CO2: 19 mmol/L — ABNORMAL LOW (ref 22–32)
Calcium: 7.9 mg/dL — ABNORMAL LOW (ref 8.9–10.3)
Chloride: 114 mmol/L — ABNORMAL HIGH (ref 98–111)
Creatinine, Ser: 2.06 mg/dL — ABNORMAL HIGH (ref 0.61–1.24)
GFR calc Af Amer: 31 mL/min — ABNORMAL LOW (ref >60)
GFR calc non Af Amer: 27 mL/min — ABNORMAL LOW (ref >60)
Glucose, Bld: 97 mg/dL (ref 70–99)
Potassium: 4.3 mmol/L (ref 3.5–5.1)
Sodium: 138 mmol/L (ref 135–145)
Total Bilirubin: 0.5 mg/dL (ref 0.3–1.2)
Total Protein: 4.4 g/dL — ABNORMAL LOW (ref 6.5–8.1)

## 2017-07-11 LAB — PHOSPHORUS: Phosphorus: 3.4 mg/dL (ref 2.5–4.6)

## 2017-07-11 LAB — PREPARE RBC (CROSSMATCH)

## 2017-07-11 LAB — HEMOGLOBIN AND HEMATOCRIT, BLOOD
HCT: 24.5 % — ABNORMAL LOW (ref 39.0–52.0)
Hemoglobin: 8.5 g/dL — ABNORMAL LOW (ref 13.0–17.0)

## 2017-07-11 LAB — MAGNESIUM: Magnesium: 2.2 mg/dL (ref 1.7–2.4)

## 2017-07-11 SURGERY — ESOPHAGOGASTRODUODENOSCOPY (EGD) WITH PROPOFOL
Anesthesia: Monitor Anesthesia Care

## 2017-07-11 MED ORDER — GLYCOPYRROLATE 0.2 MG/ML IJ SOLN
INTRAMUSCULAR | Status: DC | PRN
  Administered 2017-07-11: 0.2 mg via INTRAVENOUS

## 2017-07-11 MED ORDER — PROPOFOL 500 MG/50ML IV EMUL
INTRAVENOUS | Status: DC | PRN
  Administered 2017-07-11: 100 ug/kg/min via INTRAVENOUS

## 2017-07-11 MED ORDER — PROPOFOL 10 MG/ML IV BOLUS
INTRAVENOUS | Status: AC
  Filled 2017-07-11: qty 40

## 2017-07-11 MED ORDER — SODIUM CHLORIDE 0.9% IV SOLUTION
Freq: Once | INTRAVENOUS | Status: AC
  Administered 2017-07-11: 12:00:00 via INTRAVENOUS

## 2017-07-11 MED ORDER — LIDOCAINE 2% (20 MG/ML) 5 ML SYRINGE
INTRAMUSCULAR | Status: DC | PRN
  Administered 2017-07-11: 50 mg via INTRAVENOUS

## 2017-07-11 MED ORDER — LACTATED RINGERS IV SOLN
INTRAVENOUS | Status: DC | PRN
  Administered 2017-07-11: 14:00:00 via INTRAVENOUS

## 2017-07-11 MED ORDER — SUCRALFATE 1 GM/10ML PO SUSP
1.0000 g | Freq: Three times a day (TID) | ORAL | Status: DC
  Administered 2017-07-11 – 2017-07-14 (×12): 1 g via ORAL
  Filled 2017-07-11 (×12): qty 10

## 2017-07-11 SURGICAL SUPPLY — 15 items

## 2017-07-11 NOTE — Op Note (Addendum)
[facility] Patient Name: Alexander Mcdonald Procedure Date: 07/11/2017 MRN: [id number] Attending MD: Gatha Mayer , MD Date of Birth: 21-Jul-1926 CSN: [id number] Age: 84 Admit Type: Inpatient Procedure:                Upper GI endoscopy Indications:              Melena Providers:                Gatha Mayer, MD, Cleda Daub, RN, Cherylynn Ridges, Technician, Christell Faith, CRNA Referring MD:              Medicines:                Propofol per Anesthesia, Monitored Anesthesia Care Complications:            No immediate complications. Estimated Blood Loss:     Estimated blood loss was minimal. Procedure:                Pre-Anesthesia Assessment:                           - Prior to the procedure, a History and Physical                            was performed, and patient medications and                            allergies were reviewed. The patient's tolerance of                            previous anesthesia was also reviewed. The risks                            and benefits of the procedure and the sedation                            options and risks were discussed with the patient.                            All questions were answered, and informed consent                            was obtained. Prior Anticoagulants: The patient has                            taken no previous anticoagulant or antiplatelet                            agents. ASA Grade Assessment: III - A patient with                            severe systemic disease. After reviewing the risks  and benefits, the patient was deemed in                            satisfactory condition to undergo the procedure.                           After obtaining informed consent, the endoscope was                            passed under direct vision. Throughout the                            procedure, the patient's blood pressure, pulse, and              oxygen saturations were monitored continuously. The                            EG-[id number] ([id number]) scope was introduced through the                            mouth, and advanced to the second part of duodenum.                            The upper GI endoscopy was accomplished without                            difficulty. The patient tolerated the procedure                            well. Scope In: Scope Out: Findings:      Severe esophagitis with bleeding was found in the distal esophagus.       Biopsies were taken with a cold forceps for histology. Verification of       patient identification for the specimen was done. Estimated blood loss       was minimal. Cells for cytology were obtained by brushing. Verification       of patient identification for the specimen was done. Estimated blood       loss was minimal.      One non-bleeding cratered duodenal ulcer with no stigmata of bleeding       was found in the second portion of the duodenum. The lesion was 7 mm in       largest dimension.      An acquired mild stenosis was found in the second portion of the       duodenum and was traversed.      The exam was otherwise without abnormality.      The cardia and gastric fundus were normal on retroflexion. Impression:               - Severe erosive esophagitis. Biopsied. Cells for                            cytology obtained. Suspect reflux ? infection                           - One non-bleeding duodenal ulcer  with no stigmata                            of bleeding. Suspect this was cause of melena - no                            stigmata to do endoscopic tx seen                           - Acquired duodenal stenosis. From ulcer edema                           - The examination was otherwise normal. Moderate Sedation:      N/A- Per Anesthesia Care Recommendation:           - Return patient to hospital ward for ongoing care.                           - Full liquid diet                            BID IV PPI                           Reflux precautions                           Add Carafate                           Await path                           Check H pylori serology - did not bx as on PPI and                            high false neg rate                           Dietitian consult - help w/ nutrition if not done                            yet Procedure Code(s):        --- Professional ---                           [phone], Esophagogastroduodenoscopy, flexible,                            transoral; with biopsy, single or multiple Diagnosis Code(s):        --- Professional ---                           K20.8, Other esophagitis                           K26.9, Duodenal ulcer, unspecified as acute or  chronic, without hemorrhage or perforation                           K31.5, Obstruction of duodenum                           K92.1, Melena (includes Hematochezia) CPT copyright 2017 American Medical Association. All rights reserved. The codes documented in this report are preliminary and upon coder review may  be revised to meet current compliance requirements. Gatha Mayer, MD 07/11/2017 2:46:32 PM This report has been signed electronically. Number of Addenda: 0

## 2017-07-11 NOTE — Progress Notes (Signed)
LCSW following for SNF placement.   Patient not medically stable for dc today.   LCSW will continue to follow for dc needs.   Carolin Coy [place]

## 2017-07-11 NOTE — Anesthesia Preprocedure Evaluation (Addendum)
Anesthesia Evaluation  Patient identified by MRN, date of birth, ID band Patient awake    Reviewed: Allergy & Precautions, H&P , NPO status , Patient's Chart, lab work & pertinent test results, reviewed documented beta blocker date and time   Airway Mallampati: II  TM Distance: >3 FB Neck ROM: full    Dental no notable dental hx.    Pulmonary former smoker,    Pulmonary exam normal breath sounds clear to auscultation       Cardiovascular Exercise Tolerance: Good hypertension, Pt. on medications + Peripheral Vascular Disease   Rhythm:regular Rate:Normal  Echo 13' Study Conclusions Left ventricle: The cavity size was normal. Wall thickness was increased in a pattern of moderate LVH. Systolic function was normal. The estimated ejection fraction was in the range of 55% to 60%. Wall motion was normal; there were no regional wall motion abnormalities. - Aortic valve: Mild to moderate regurgitation. Valve area: 1.74cm^2(VTI). Valve area: 1.55cm^2 (Vmax). - Atrial septum: No defect or patent foramen ovale was identified. - Pulmonic valve: Peak gradient: 99m Hg (S).   Neuro/Psych Depression    GI/Hepatic   Endo/Other  Hypothyroidism   Renal/GU CRFRenal disease     Musculoskeletal   Abdominal   Peds  Hematology  (+) anemia ,   Anesthesia Other Findings   Reproductive/Obstetrics                           Anesthesia Physical Anesthesia Plan  ASA: III  Anesthesia Plan: MAC   Post-op Pain Management:    Induction: Intravenous  PONV Risk Score and Plan: 1 and Treatment may vary due to age or medical condition  Airway Management Planned: Mask, Natural Airway and Nasal Cannula  Additional Equipment:   Intra-op Plan:   Post-operative Plan:   Informed Consent: I have reviewed the patients History and Physical, chart, labs and discussed the procedure including the risks,  benefits and alternatives for the proposed anesthesia with the patient or authorized representative who has indicated his/her understanding and acceptance.   Dental Advisory Given  Plan Discussed with: Anesthesiologist, CRNA and Surgeon  Anesthesia Plan Comments:         Anesthesia Quick Evaluation

## 2017-07-11 NOTE — Progress Notes (Signed)
Spoke with nurse this morning-hgb dropped to 7.0- ordered 1 unit PRBC's prior to EGD scheduled this afternoon.  Please await further recommendations after EGD this afternoon.  Ellouise Newer, PA-C [facility]

## 2017-07-11 NOTE — Interval H&P Note (Signed)
History and Physical Interval Note:  07/11/2017 1:46 PM  Alexander Mcdonald  has presented today for surgery, with the diagnosis of Anemia  The various methods of treatment have been discussed with the patient and family. After consideration of risks, benefits and other options for treatment, the patient has consented to  Procedure(s): ESOPHAGOGASTRODUODENOSCOPY (EGD) WITH PROPOFOL (N/A) as a surgical intervention .  The patient's history has been reviewed, patient examined, no change in status, stable for surgery.  I have reviewed the patient's chart and labs.  Questions were answered to the patient's satisfaction.     [name]

## 2017-07-11 NOTE — Transfer of Care (Signed)
Immediate Anesthesia Transfer of Care Note  Patient: Alexander Mcdonald  Procedure(s) Performed: ESOPHAGOGASTRODUODENOSCOPY (EGD) WITH PROPOFOL (N/A ) BIOPSY BRONCHIAL BRUSHINGS  Patient Location: PACU  Anesthesia Type:MAC  Level of Consciousness: awake, alert , oriented and patient cooperative  Airway & Oxygen Therapy: Patient Spontanous Breathing and Patient connected to nasal cannula oxygen  Post-op Assessment: Report given to RN and Post -op Vital signs reviewed and stable  Post vital signs: Reviewed and stable  Last Vitals:  Vitals Value Taken Time  BP    Temp    Pulse 109 07/11/2017  2:39 PM  Resp 10 07/11/2017  2:39 PM  SpO2 100 % 07/11/2017  2:39 PM  Vitals shown include unvalidated device data.  Last Pain:  Vitals:   07/11/17 1410  TempSrc: (P) Oral  PainSc:          Complications: No apparent anesthesia complications

## 2017-07-11 NOTE — Progress Notes (Signed)
PROGRESS NOTE    Alexander Mcdonald   VEH:[id number]  DOB: Aug 23, 1926  DOA: 07/05/2017 PCP: Marletta Lor, MD   Brief Narrative:  Alexander Mcdonald is a 84 y.o.malewith medical history significant ofhypertension, hyperlipidemia, hypothyroidism and other comorbids who presents with decreased p.o. intake, nausea, vomiting, diarrhea. He was recently seen by his primary care physician and was diagnosed with pneumonia and was started on azithromycin. No improvement and was seen again by his primary care physician, was switched to Levaquin at the time. Daughter who was at bedside and provided most of the history who states that patient has had decreased p.o. intake, nausea, vomiting, diarrhea since antibiotic initiation. He also has had productive cough of brown sputum as well as wheezing and shortness of breath.   Daughter says she has also had similar upper respiratory issues which lasted about 3 weeks. He has had no fevers or chills at home, no chest pain or abdominal pain. Diarrhea was loose and watery in nature   In the ED Labs were obtained and revealed sodium 122, creatinine 2.38, lactic acid 1.7, WBC 9.6.    He was referred for admission for hyponatremia as well as poor p.o. intake and was hydrated and treated for Acute Bronchitis.  Marland Kitchen Appetite remained poor but daughter does not want to try any appetite stimulants. His Renal Function and and Sodium function were improving but patient's appetite still remained poor. Patient was noted to get dizzy with standing,  complained of Reflux and had a dark stool.  Nursing states it was dark black. FOBT was positive and Hb/Hct dropped and suspecting Upper GIB.   Subjective: No complaints.     Assessment & Plan:   Principal Problem:   GI bleed   Acute blood loss anemia and orthostatic hypotension - Hb dropped from 14/5/39.2 to 7.0 today  - + FOBT and dark stools (Melena) - receiving 1 U PRBC today- Iron level normal - EGD shows  severe esophagitis, duodenal ulcer and mild duodenal narrowing - plan> PPI, Carafate, full liquids, cont to hold aspirin  - Check H pylori  Nausea/ vomiting/diarrhea - since being on antibiotics or pneumonia- upper GI symptoms may be related to above PUD - follow     Hyperlipidemia - cont Lipitor    Essential hypertension - Norvasc, Monopril on hold  BPH - Flomax    Chronic kidney disease stage 4 - stable    Hyponatremia/ dehydration - improved with IVF    Hypothyroidism - synthroid    DVT prophylaxis: SCDs Code Status: Full code Family Communication:  Disposition Plan: home vs SNF Consultants:   GI Procedures:   EGD: Impression:               - Severe erosive esophagitis. Biopsied. Cells for                            cytology obtained. Suspect reflux ? infection                           - One non-bleeding duodenal ulcer with no stigmata                            of bleeding. Suspect this was cause of melena - no  stigmata to do endoscopic tx seen                           - Acquired duodenal stenosis. From ulcer edema                           - The examination was otherwise normal.    Antimicrobials:  Anti-infectives (From admission, onward)   None       Objective: Vitals:   07/11/17 1445 07/11/17 1450 07/11/17 1455 07/11/17 1500  BP:  140/69  138/71  Pulse: 98 95 94 92  Resp: (!) 24 17 18 20   Temp:      TempSrc:      SpO2: 100% 99% 98% 98%  Weight:      Height:        Intake/Output Summary (Last 24 hours) at 07/11/2017 1525 Last data filed at 07/11/2017 1439 Gross per 24 hour  Intake 2076.67 ml  Output 500 ml  Net 1576.67 ml   Filed Weights   07/05/17 1156 07/11/17 1255  Weight: 73 kg (161 lb) 73 kg (161 lb)    Examination: General exam: Appears comfortable  HEENT: PERRLA, oral mucosa moist, no sclera icterus or thrush Respiratory system: Clear to auscultation. Respiratory effort normal. Cardiovascular  system: S1 & S2 heard, RRR.   Gastrointestinal system: Abdomen soft, non-tender, nondistended. Normal bowel sound. No organomegaly Central nervous system: Alert and oriented. No focal neurological deficits. Extremities: No cyanosis, clubbing or edema Skin: No rashes or ulcers Psychiatry:  Mood & affect appropriate.     Data Reviewed: I have personally reviewed following labs and imaging studies  CBC: Recent Labs  Lab 07/07/17 0559 07/08/17 0537 07/09/17 0526 07/10/17 0557 07/10/17 1618 07/11/17 0613  WBC 15.4* 11.3* 11.8* 10.5  --  14.8*  NEUTROABS 13.1* 9.6* 9.9* 8.7  --  11.6*  HGB 13.2 13.2 12.0* 8.6* 7.7* 7.0*  HCT 36.3* 36.4* 34.0* 24.6* 23.1* 20.7*  MCV 82.5 83.3 84.4 87.2  --  91.2  PLT 233 222 219 229  --  914   Basic Metabolic Panel: Recent Labs  Lab 07/07/17 0559 07/08/17 0537 07/09/17 0526 07/10/17 0557 07/11/17 0613  NA 128* 132* 132* 136 138  K 3.9 4.2 4.2 4.7 4.3  CL 99* 103 105 111 114*  CO2 21* 22 21* 21* 19*  GLUCOSE 112* 112* 108* 136* 97  BUN 31* 31* 29* 71* 82*  CREATININE 2.00* 1.77* 1.51* 1.87* 2.06*  CALCIUM 8.4* 8.4* 8.0* 8.2* 7.9*  MG 1.8 2.1 2.0 2.2 2.2  PHOS 3.2 3.0 2.5 2.8 3.4   GFR: Estimated Creatinine Clearance: 21.1 mL/min (A) (by C-G formula based on SCr of 2.06 mg/dL (H)). Liver Function Tests: Recent Labs  Lab 07/07/17 0559 07/08/17 0537 07/09/17 0526 07/10/17 0557 07/11/17 0613  AST 39 38 35 24 24  ALT 36 38 39 30 25  ALKPHOS 59 55 51 40 34*  BILITOT 1.1 0.7 0.7 0.4 0.5  PROT 6.2* 5.9* 5.4* 4.7* 4.4*  ALBUMIN 3.3* 3.1* 2.9* 2.6* 2.5*   No results for input(s): LIPASE, AMYLASE in the last 168 hours. No results for input(s): AMMONIA in the last 168 hours. Coagulation Profile: No results for input(s): INR, PROTIME in the last 168 hours. Cardiac Enzymes: Recent Labs  Lab 07/05/17 1216  TROPONINI <0.03   BNP (last 3 results) No results for input(s): PROBNP in the last 8760 hours. HbA1C: No results  for input(s):  HGBA1C in the last 72 hours. CBG: No results for input(s): GLUCAP in the last 168 hours. Lipid Profile: No results for input(s): CHOL, HDL, LDLCALC, TRIG, CHOLHDL, LDLDIRECT in the last 72 hours. Thyroid Function Tests: No results for input(s): TSH, T4TOTAL, FREET4, T3FREE, THYROIDAB in the last 72 hours. Anemia Panel: Recent Labs    07/10/17 0557  VITAMINB12 834  FOLATE 6.8  FERRITIN 299  TIBC 195*  IRON 128  RETICCTPCT 3.2*   Urine analysis:    Component Value Date/Time   COLORURINE STRAW (A) 07/10/2017 1007   APPEARANCEUR CLEAR 07/10/2017 1007   LABSPEC 1.014 07/10/2017 1007   PHURINE 5.0 07/10/2017 1007   GLUCOSEU NEGATIVE 07/10/2017 1007   HGBUR NEGATIVE 07/10/2017 1007   HGBUR large 12/17/2006 1056   BILIRUBINUR NEGATIVE 07/10/2017 1007   BILIRUBINUR n 01/30/2014 1508   KETONESUR NEGATIVE 07/10/2017 1007   PROTEINUR NEGATIVE 07/10/2017 1007   UROBILINOGEN 0.2 06/01/2014 1448   NITRITE NEGATIVE 07/10/2017 1007   LEUKOCYTESUR TRACE (A) 07/10/2017 1007   Sepsis Labs: @LABRCNTIP (procalcitonin:4,lacticidven:4) ) Recent Results (from the past 240 hour(s))  Culture, Urine     Status: None   Collection Time: 07/05/17  4:57 PM  Result Value Ref Range Status   Specimen Description   Final    URINE, RANDOM Performed at [facility] [address] [place], [place] [address]    Special Requests   Final    NONE Performed at [facility], [place] [address], [place], [place] [address]    Culture   Final    NO GROWTH Performed at [facility], [place] [address], [place], [place] [address]    Report Status 07/07/2017 FINAL  Final  Respiratory Panel by PCR     Status: None   Collection Time: 07/06/17 10:05 AM  Result Value Ref Range Status   Adenovirus NOT DETECTED NOT DETECTED Final   Coronavirus 229E NOT DETECTED NOT DETECTED Final   Coronavirus HKU1 NOT DETECTED NOT DETECTED Final   Coronavirus NL63 NOT DETECTED NOT DETECTED  Final   Coronavirus OC43 NOT DETECTED NOT DETECTED Final   Metapneumovirus NOT DETECTED NOT DETECTED Final   Rhinovirus / Enterovirus NOT DETECTED NOT DETECTED Final   Influenza A NOT DETECTED NOT DETECTED Final   Influenza B NOT DETECTED NOT DETECTED Final   Parainfluenza Virus 1 NOT DETECTED NOT DETECTED Final   Parainfluenza Virus 2 NOT DETECTED NOT DETECTED Final   Parainfluenza Virus 3 NOT DETECTED NOT DETECTED Final   Parainfluenza Virus 4 NOT DETECTED NOT DETECTED Final   Respiratory Syncytial Virus NOT DETECTED NOT DETECTED Final   Bordetella pertussis NOT DETECTED NOT DETECTED Final   Chlamydophila pneumoniae NOT DETECTED NOT DETECTED Final   Mycoplasma pneumoniae NOT DETECTED NOT DETECTED Final  Culture, Urine     Status: Abnormal   Collection Time: 07/10/17 12:41 AM  Result Value Ref Range Status   Specimen Description   Final    URINE, RANDOM Performed at [facility] [address], [place], [place] [address]    Special Requests   Final    NONE Performed at [facility] [place] [address], [place], [place] [address]    Culture (A)  Final    <10,000 COLONIES/mL INSIGNIFICANT GROWTH Performed at [facility] [address], [place], [place] [address]    Report Status 07/11/2017 FINAL  Final         Radiology Studies: No results found.  Scheduled Meds: . atorvastatin  10 mg Oral q1800  . atropine  1 drop Right Eye QHS  . brinzolamide  1 drop Both Eyes BID  . chlorhexidine  15 mL Mouth Rinse BID  . feeding supplement  1 Container Oral Q24H  . guaiFENesin  1,200 mg Oral BID  . latanoprost  1 drop Left Eye QHS  . levothyroxine  50 mcg Oral QAC breakfast  . mouth rinse  15 mL Mouth Rinse q12n4p  . multivitamin with minerals  1 tablet Oral Daily  . pantoprazole (PROTONIX) IV  40 mg Intravenous Q12H  . sucralfate  1 g Oral TID WC & HS  . tamsulosin  0.4 mg Oral Daily   Continuous Infusions: . sodium chloride  Stopped (07/11/17 1150)     LOS: 4 days    Time spent in minutes: 35     Debbe Odea, MD [facility] Pager: [email or web address] Password [facility] 07/11/2017, 3:25 PM

## 2017-07-11 NOTE — Anesthesia Postprocedure Evaluation (Signed)
Anesthesia Post Note  Patient: Alexander Mcdonald  Procedure(s) Performed: ESOPHAGOGASTRODUODENOSCOPY (EGD) WITH PROPOFOL (N/A ) BIOPSY     Patient location during evaluation: PACU Anesthesia Type: MAC Level of consciousness: awake and alert Pain management: pain level controlled Vital Signs Assessment: post-procedure vital signs reviewed and stable Respiratory status: spontaneous breathing, nonlabored ventilation, respiratory function stable and patient connected to nasal cannula oxygen Cardiovascular status: stable and blood pressure returned to baseline Postop Assessment: no apparent nausea or vomiting Anesthetic complications: no    Last Vitals:  Vitals:   07/11/17 1455 07/11/17 1500  BP:  138/71  Pulse: 94 92  Resp: 18 20  Temp:    SpO2: 98% 98%    Last Pain:  Vitals:   07/11/17 1500  TempSrc:   PainSc: 0-No pain                 Lamberto Dinapoli

## 2017-07-11 NOTE — Progress Notes (Signed)
OT Cancellation Note  Patient Details Name: Alexander Mcdonald MRN: [id number] DOB: 1926/08/17   Cancelled Treatment:    Reason Eval/Treat Not Completed: Patient at procedure or test/ unavailable.  At endo.  Will check back tomorrow.  Trayton Szabo 07/11/2017, 12:56 PM  Lesle Chris, OTR/L [phone] 07/11/2017

## 2017-07-12 DIAGNOSIS — K2210 Ulcer of esophagus without bleeding: Secondary | ICD-10-CM

## 2017-07-12 DIAGNOSIS — K264 Chronic or unspecified duodenal ulcer with hemorrhage: Secondary | ICD-10-CM

## 2017-07-12 DIAGNOSIS — K315 Obstruction of duodenum: Secondary | ICD-10-CM

## 2017-07-12 MED ORDER — LISINOPRIL 20 MG PO TABS
20.0000 mg | ORAL_TABLET | Freq: Every day | ORAL | Status: DC
  Administered 2017-07-13 – 2017-07-14 (×2): 20 mg via ORAL
  Filled 2017-07-12 (×3): qty 1

## 2017-07-12 MED ORDER — ENSURE ENLIVE PO LIQD
237.0000 mL | Freq: Three times a day (TID) | ORAL | Status: DC
  Administered 2017-07-12 – 2017-07-14 (×6): 237 mL via ORAL

## 2017-07-12 MED ORDER — AMLODIPINE BESYLATE 5 MG PO TABS
2.5000 mg | ORAL_TABLET | Freq: Every day | ORAL | Status: DC
  Administered 2017-07-13 – 2017-07-14 (×2): 2.5 mg via ORAL
  Filled 2017-07-12 (×3): qty 1

## 2017-07-12 MED ORDER — GI COCKTAIL ~~LOC~~
30.0000 mL | Freq: Three times a day (TID) | ORAL | Status: DC | PRN
  Administered 2017-07-12: 30 mL via ORAL
  Filled 2017-07-12: qty 30

## 2017-07-12 NOTE — Progress Notes (Signed)
Nutrition Follow-up  DOCUMENTATION CODES:   Not applicable  INTERVENTION:    Ensure Enlive po TID (strawberry), each supplement provides 350 kcal and 20 grams of protein  Magic cup TID with meals, each supplement provides 290 kcal and 9 grams of protein  Provide MVI daily  NUTRITION DIAGNOSIS:   Inadequate oral intake related to acute illness, nausea, vomiting, decreased appetite as evidenced by per patient/family report.  Ongoing  GOAL:   Patient will meet greater than or equal to 90% of their needs  Progressing  MONITOR:   PO intake, Weight trends, Labs, I & O's  REASON FOR ASSESSMENT:   Consult Assessment of nutrition requirement/status  ASSESSMENT:   84 y.o. male with medical history significant of HTN, hyperlipidemia, and hypothyroidism. He presented to the ED with decreased p.o. intake, nausea, vomiting, and diarrhea. He was recently seen by his PCP and was diagnosed with pneumonia and started on azithromycin. No improvement and was seen again and switched to Levaquin at the time. Daughter states that patient has had decreased p.o. intake, nausea, vomiting, diarrhea since antibiotic initiation. He also has had cough productive of brown sputum, wheezing, and SOB. Daughter says she has also had similar upper respiratory issues which lasted about 3 weeks. He has had no fevers or chills at home, no chest pain or abdominal pain. Diarrhea is loose and watery in nature but he has not had any fevers or abdominal pain associated with diarrhea.   6/26- EGD showed severe erosive esophagitis and non bleeding duodenal ulcer  Pt experienced burning sensations in his throat this morning upon eating grits. He continues with a full liquid diet to minimize associated symptoms with esophagitis. No meal completion have been charted for the last two days. Daughter states intake has been minimal. Discussed the importance of protein intake for preservation of lean body mass while on a full  liquid diet. Pt reports Ensure does not cause pain and is willing to drink 3/day to receive adequate calories and protein. Recommend pt to avoid acidic foods and eat smaller portions. Will continue to monitor intake closely.   Possible d/c tomorrow if intake improves and pain decreases.   Medications reviewed and include: MVI with minerals Labs reviewed.   Diet Order:   Diet Order           Diet full liquid Room service appropriate? Yes; Fluid consistency: Thin  Diet effective now          EDUCATION NEEDS:   No education needs have been identified at this time  Skin:  Skin Assessment: Reviewed RN Assessment  Last BM:  07/09/17  Height:   Ht Readings from Last 1 Encounters:  07/11/17 5\' 5"  (1.651 m)    Weight:   Wt Readings from Last 1 Encounters:  07/11/17 161 lb (73 kg)    Ideal Body Weight:  61.82 kg  BMI:  Body mass index is 26.79 kg/m.  Estimated Nutritional Needs:   Kcal:  1530-1750 (21-24 kcal/kg)  Protein:  70-80 grams  Fluid:  >/= 1.7 L/day    Mariana Single RD, LDN Clinical Nutrition Pager # - [phone]

## 2017-07-12 NOTE — Progress Notes (Signed)
PROGRESS NOTE    Alexander Mcdonald   IRC:[id number]  DOB: 1926/01/25  DOA: 07/05/2017 PCP: Marletta Lor, MD   Brief Narrative:  Alexander Mcdonald is a 84 y.o.malewith medical history significant ofhypertension, hyperlipidemia, hypothyroidism and other comorbids who presents with decreased p.o. intake, nausea, vomiting, diarrhea. He was recently seen by his primary care physician and was diagnosed with pneumonia and was started on azithromycin. No improvement and was seen again by his primary care physician, was switched to Levaquin at the time. Daughter who was at bedside and provided most of the history who states that patient has had decreased p.o. intake, nausea, vomiting, diarrhea since antibiotic initiation. He also has had productive cough of brown sputum as well as wheezing and shortness of breath.   Daughter says she has also had similar upper respiratory issues which lasted about 3 weeks. He has had no fevers or chills at home, no chest pain or abdominal pain. Diarrhea was loose and watery in nature   In the ED Labs were obtained and revealed sodium 122, creatinine 2.38, lactic acid 1.7, WBC 9.6.    He was referred for admission for hyponatremia as well as poor p.o. intake and was hydrated and treated for Acute Bronchitis.  Marland Kitchen Appetite remained poor but daughter does not want to try any appetite stimulants. His Renal Function and and Sodium function were improving but patient's appetite still remained poor. Patient was noted to get dizzy with standing,  complained of Reflux and had a dark stool.  Nursing states it was dark black. FOBT was positive and Hb/Hct dropped and suspecting Upper GIB.   Subjective: Having heart burn on and off per RN today. Still spitting saliva out onto his gown today.  He has pain when he swallows most foods and liquids but can tolerate pudding and ice cream. Asking if he can have intravenous feeds. No vomiting. I discussed the EGD findings which  him in detail and explained the plan. Advised him to eat more pudding, ice cream and protein shakes which he is able to tolerate. This would be preferable over IV feedings.   Assessment & Plan:   Principal Problem:   GI bleed   Acute blood loss anemia and orthostatic hypotension - + FOBT and dark stools (Melena) - Hb dropped from 14.5 steadily to 7.0 on 6/26- tranfused  1 U PRBC  - Iron level normal - EGD 6/26 - shows severe esophagitis, duodenal ulcer and mild duodenal narrowing - plan> PPI, Carafate, full liquids, cont to hold aspirin  - Check H pylori - add Gi Cocktail, Ensure and encourage full liquid diet - cont IVF  Nausea/ vomiting/diarrhea - since being on antibiotics or pneumonia- upper GI symptoms may be related to above PUD - see above plan    Hyperlipidemia - cont Lipitor    Essential hypertension - Norvasc, Monopril on hold- will resume today as BP is elevated  BPH - Flomax    Chronic kidney disease stage 4 - stable    Hyponatremia/ dehydration - improved with IVF    Hypothyroidism - synthroid    DVT prophylaxis: SCDs Code Status: Full code Family Communication:  Disposition Plan: home vs SNF Consultants:   GI Procedures:   EGD: Impression:               - Severe erosive esophagitis. Biopsied. Cells for  cytology obtained. Suspect reflux ? infection                           - One non-bleeding duodenal ulcer with no stigmata                            of bleeding. Suspect this was cause of melena - no                            stigmata to do endoscopic tx seen                           - Acquired duodenal stenosis. From ulcer edema                           - The examination was otherwise normal.    Antimicrobials:  Anti-infectives (From admission, onward)   None       Objective: Vitals:   07/11/17 1500 07/11/17 1639 07/11/17 2036 07/12/17 0502  BP: 138/71 (!) 143/64 (!) 155/55 (!) 178/44  Pulse: 92 77 78 69    Resp: 20 20 17 18   Temp:  98 F (36.7 C) 97.7 F (36.5 C) 97.7 F (36.5 C)  TempSrc:  Oral Oral Oral  SpO2: 98% 95% 99% 97%  Weight:      Height:        Intake/Output Summary (Last 24 hours) at 07/12/2017 0939 Last data filed at 07/12/2017 0031 Gross per 24 hour  Intake 1321.67 ml  Output 800 ml  Net 521.67 ml   Filed Weights   07/05/17 1156 07/11/17 1255  Weight: 73 kg (161 lb) 73 kg (161 lb)    Examination: General exam: Appears comfortable  HEENT: PERRLA, oral mucosa moist, no sclera icterus or thrush Respiratory system: Clear to auscultation. Respiratory effort normal. Cardiovascular system: S1 & S2 heard, RRR.   Gastrointestinal system: Abdomen soft, non-tender, nondistended. Normal bowel sound. No organomegaly Central nervous system: Alert and oriented. No focal neurological deficits. Extremities: No cyanosis, clubbing or edema Skin: No rashes or ulcers Psychiatry:  Mood & affect appropriate.     Data Reviewed: I have personally reviewed following labs and imaging studies  CBC: Recent Labs  Lab 07/07/17 0559 07/08/17 0537 07/09/17 0526 07/10/17 0557 07/10/17 1618 07/11/17 0613 07/11/17 1935  WBC 15.4* 11.3* 11.8* 10.5  --  14.8*  --   NEUTROABS 13.1* 9.6* 9.9* 8.7  --  11.6*  --   HGB 13.2 13.2 12.0* 8.6* 7.7* 7.0* 8.5*  HCT 36.3* 36.4* 34.0* 24.6* 23.1* 20.7* 24.5*  MCV 82.5 83.3 84.4 87.2  --  91.2  --   PLT 233 222 219 229  --  210  --    Basic Metabolic Panel: Recent Labs  Lab 07/07/17 0559 07/08/17 0537 07/09/17 0526 07/10/17 0557 07/11/17 0613  NA 128* 132* 132* 136 138  K 3.9 4.2 4.2 4.7 4.3  CL 99* 103 105 111 114*  CO2 21* 22 21* 21* 19*  GLUCOSE 112* 112* 108* 136* 97  BUN 31* 31* 29* 71* 82*  CREATININE 2.00* 1.77* 1.51* 1.87* 2.06*  CALCIUM 8.4* 8.4* 8.0* 8.2* 7.9*  MG 1.8 2.1 2.0 2.2 2.2  PHOS 3.2 3.0 2.5 2.8 3.4   GFR: Estimated Creatinine Clearance: 21.1 mL/min (A) (by C-G formula  based on SCr of 2.06 mg/dL (H)). Liver  Function Tests: Recent Labs  Lab 07/07/17 0559 07/08/17 0537 07/09/17 0526 07/10/17 0557 07/11/17 0613  AST 39 38 35 24 24  ALT 36 38 39 30 25  ALKPHOS 59 55 51 40 34*  BILITOT 1.1 0.7 0.7 0.4 0.5  PROT 6.2* 5.9* 5.4* 4.7* 4.4*  ALBUMIN 3.3* 3.1* 2.9* 2.6* 2.5*   No results for input(s): LIPASE, AMYLASE in the last 168 hours. No results for input(s): AMMONIA in the last 168 hours. Coagulation Profile: No results for input(s): INR, PROTIME in the last 168 hours. Cardiac Enzymes: Recent Labs  Lab 07/05/17 1216  TROPONINI <0.03   BNP (last 3 results) No results for input(s): PROBNP in the last 8760 hours. HbA1C: No results for input(s): HGBA1C in the last 72 hours. CBG: No results for input(s): GLUCAP in the last 168 hours. Lipid Profile: No results for input(s): CHOL, HDL, LDLCALC, TRIG, CHOLHDL, LDLDIRECT in the last 72 hours. Thyroid Function Tests: No results for input(s): TSH, T4TOTAL, FREET4, T3FREE, THYROIDAB in the last 72 hours. Anemia Panel: Recent Labs    07/10/17 0557  VITAMINB12 834  FOLATE 6.8  FERRITIN 299  TIBC 195*  IRON 128  RETICCTPCT 3.2*   Urine analysis:    Component Value Date/Time   COLORURINE STRAW (A) 07/10/2017 1007   APPEARANCEUR CLEAR 07/10/2017 1007   LABSPEC 1.014 07/10/2017 1007   PHURINE 5.0 07/10/2017 1007   GLUCOSEU NEGATIVE 07/10/2017 1007   HGBUR NEGATIVE 07/10/2017 1007   HGBUR large 12/17/2006 1056   BILIRUBINUR NEGATIVE 07/10/2017 1007   BILIRUBINUR n 01/30/2014 1508   KETONESUR NEGATIVE 07/10/2017 1007   PROTEINUR NEGATIVE 07/10/2017 1007   UROBILINOGEN 0.2 06/01/2014 1448   NITRITE NEGATIVE 07/10/2017 1007   LEUKOCYTESUR TRACE (A) 07/10/2017 1007   Sepsis Labs: @LABRCNTIP (procalcitonin:4,lacticidven:4) ) Recent Results (from the past 240 hour(s))  Culture, Urine     Status: None   Collection Time: 07/05/17  4:57 PM  Result Value Ref Range Status   Specimen Description   Final    URINE, RANDOM Performed at  [facility] [address], [place], [place] [address]    Special Requests   Final    NONE Performed at [facility], [place] [address], [place], [place] [address]    Culture   Final    NO GROWTH Performed at [facility], [place] [address], [place], [place] [address]    Report Status 07/07/2017 FINAL  Final  Respiratory Panel by PCR     Status: None   Collection Time: 07/06/17 10:05 AM  Result Value Ref Range Status   Adenovirus NOT DETECTED NOT DETECTED Final   Coronavirus 229E NOT DETECTED NOT DETECTED Final   Coronavirus HKU1 NOT DETECTED NOT DETECTED Final   Coronavirus NL63 NOT DETECTED NOT DETECTED Final   Coronavirus OC43 NOT DETECTED NOT DETECTED Final   Metapneumovirus NOT DETECTED NOT DETECTED Final   Rhinovirus / Enterovirus NOT DETECTED NOT DETECTED Final   Influenza A NOT DETECTED NOT DETECTED Final   Influenza B NOT DETECTED NOT DETECTED Final   Parainfluenza Virus 1 NOT DETECTED NOT DETECTED Final   Parainfluenza Virus 2 NOT DETECTED NOT DETECTED Final   Parainfluenza Virus 3 NOT DETECTED NOT DETECTED Final   Parainfluenza Virus 4 NOT DETECTED NOT DETECTED Final   Respiratory Syncytial Virus NOT DETECTED NOT DETECTED Final   Bordetella pertussis NOT DETECTED NOT DETECTED Final   Chlamydophila pneumoniae NOT DETECTED NOT  DETECTED Final   Mycoplasma pneumoniae NOT DETECTED NOT DETECTED Final  Culture, Urine     Status: Abnormal   Collection Time: 07/10/17 12:41 AM  Result Value Ref Range Status   Specimen Description   Final    URINE, RANDOM Performed at [place] [address] [place], [place] [address]    Special Requests   Final    NONE Performed at [facility], [place] [address], [place], [place] [address]    Culture (A)  Final    <10,000 COLONIES/mL INSIGNIFICANT GROWTH Performed at [place] [address], [place], [place] [address]    Report Status 07/11/2017  FINAL  Final         Radiology Studies: No results found.    Scheduled Meds: . atorvastatin  10 mg Oral q1800  . atropine  1 drop Right Eye QHS  . brinzolamide  1 drop Both Eyes BID  . chlorhexidine  15 mL Mouth Rinse BID  . feeding supplement (ENSURE ENLIVE)  237 mL Oral TID BM  . latanoprost  1 drop Left Eye QHS  . levothyroxine  50 mcg Oral QAC breakfast  . mouth rinse  15 mL Mouth Rinse q12n4p  . multivitamin with minerals  1 tablet Oral Daily  . pantoprazole (PROTONIX) IV  40 mg Intravenous Q12H  . sucralfate  1 g Oral TID WC & HS  . tamsulosin  0.4 mg Oral Daily   Continuous Infusions: . sodium chloride Stopped (07/11/17 1150)     LOS: 5 days    Time spent in minutes: 35     Debbe Odea, MD [facility] Pager: [email or web address] Password [facility] 07/12/2017, 9:39 AM

## 2017-07-12 NOTE — Progress Notes (Addendum)
LCSW met with patient and patient's daughter, [name] at bedside.   Patient and family have decided to go home with home health. Patient and family will admit from community if they feel they need a higher level of care.   LCSW notified RNCM of plan to dc home. Patient not stable to dc today.   LCSW signing off.   Carolin Coy [place]

## 2017-07-12 NOTE — Progress Notes (Addendum)
Progress Note   Assessment / Plan:   Assessment: 1.  Melena: No further melena, hemoglobin stable at 8.5, EGD 07/11/2017 with severe erosive esophagitis and nonbleeding duodenal ulcer 2.  Decreased appetite: Likely at least partially related to above as well as age 84.  Anemia: Stable hemoglobin 8.5 today  Plan: 1.  H. pylori serology pending 2.  Continue twice daily PPI indefinitely 3.  Discontinue NSAIDs indefinitely 4.  Continue Carafate 5.  Pathology pending 6.  Dietitian consult if not done 7.  Full liquid diet for now 8.  Please await further recommendations from Dr. Carlean Purl later today   LOS: 5 days   Lavone Nian [facility]  07/12/2017, 10:21 AM  Pager # [phone]     GI Attending   I have taken an interval history, reviewed the chart and examined the patient. I agree with the Advanced Practitioner's note, impression and recommendations.    Gatha Mayer, MD, [facility] 07/12/2017 9:06 PM    Subjective  Chief Complaint: Melena, anemia  This morning found laying in bed, per nursing continues to have secretions spitting into an emesis bag as well as complains of reflux.  Continues with a poor appetite.  Hospitalist discussed increasing protein shakes and puddings with the patient today as he seems to tolerate this.   Objective   Vital signs in last 24 hours: Temp:  [97.2 F (36.2 C)-98 F (36.7 C)] 97.7 F (36.5 C) (06/27 0502) Pulse Rate:  [69-105] 69 (06/27 0502) Resp:  [14-24] 18 (06/27 0502) BP: (107-178)/(32-71) 178/44 (06/27 0502) SpO2:  [95 %-100 %] 97 % (06/27 0502) Weight:  [161 lb (73 kg)] 161 lb (73 kg) (06/26 1255) Last BM Date: 07/09/17 General:   Ill appearing, elderly White male in NAD Heart:  Regular rate and rhythm; no murmurs Lungs: Respirations even and unlabored, lungs CTA bilaterally Abdomen:  Soft, mild epigastric ttp and nondistended. Normal bowel sounds. Extremities:  Without edema. Neurologic:   Alert and oriented,  grossly normal neurologically. Psych:  Cooperative. Normal mood and affect.  Intake/Output from previous day: 06/26 0701 - 06/27 0700 In: 1321.7 [I.V.:691.7; Blood:630] Out: 800 [Urine:800]  Lab Results: Recent Labs    07/10/17 0557 07/10/17 1618 07/11/17 0613 07/11/17 1935  WBC 10.5  --  14.8*  --   HGB 8.6* 7.7* 7.0* 8.5*  HCT 24.6* 23.1* 20.7* 24.5*  PLT 229  --  210  --    BMET Recent Labs    07/10/17 0557 07/11/17 0613  NA 136 138  K 4.7 4.3  CL 111 114*  CO2 21* 19*  GLUCOSE 136* 97  BUN 71* 82*  CREATININE 1.87* 2.06*  CALCIUM 8.2* 7.9*   LFT Recent Labs    07/11/17 0613  PROT 4.4*  ALBUMIN 2.5*  AST 24  ALT 25  ALKPHOS 34*  BILITOT 0.5   EGD 07/11/2017, Dr. Carlean Purl: Impression:               - Severe erosive esophagitis. Biopsied. Cells for                            cytology obtained. Suspect reflux ? infection                           - One non-bleeding duodenal ulcer with no stigmata  of bleeding. Suspect this was cause of melena - no                            stigmata to do endoscopic tx seen                           - Acquired duodenal stenosis. From ulcer edema                           - The examination was otherwise normal. Recommendation:               - Return patient to hospital ward for ongoing care.                           - Full liquid diet                           BID IV PPI                           Reflux precautions                           Add Carafate                           Await path                           Check H pylori serology - did not bx as on PPI and                            high false neg rate                           Dietitian consult - help w/ nutrition if not done                            yet

## 2017-07-12 NOTE — Progress Notes (Addendum)
Physical Therapy Treatment Patient Details Name: Alexander Mcdonald MRN: [id number] DOB: 1926/04/28 Today's Date: 07/12/2017    History of Present Illness This 84 year old man was admitted with decreased PO intake, diarrhea and vomiting.  He was recently being treated for pna.  PMH:  prostate CA, CKD, HLD, hypothyroid, HTN.    PT Comments    Patient with limited progress with ambulation due to orthostatic positive per BP and with c/o weakness.  Able to demonstrate improved BP after standing LE therex.  Feel he continues to be appropriate for SNF level rehab at d/c.   Marland Kitchen Orthostatic VS for the past 24 hrs (Last 3 readings):  BP- Lying Pulse- Lying BP- Sitting Pulse- Sitting BP- Standing at 0 minutes Pulse- Standing at 0 minutes BP- Standing at 3 minutes Pulse- Standing at 3 minutes  07/12/17 1100 172/58 70 140/52 86 106/62 91 121/46 93    Follow Up Recommendations  SNF     Equipment Recommendations  None recommended by PT    Recommendations for Other Services       Precautions / Restrictions Precautions Precautions: Fall Precaution Comments: watch BP Restrictions Weight Bearing Restrictions: No    Mobility  Bed Mobility Overal bed mobility: Independent Bed Mobility: Supine to Sit     Supine to sit: HOB elevated     General bed mobility comments: assist for trunk  Transfers Overall transfer level: Needs assistance Equipment used: Rolling walker (2 wheeled) Transfers: Sit to/from Stand Sit to Stand: Min assist Stand pivot transfers: Min assist       General transfer comment: assist to steady  Ambulation/Gait             General Gait Details: deferred due to orthostatic   Stairs             Wheelchair Mobility    Modified Rankin (Stroke Patients Only)       Balance Overall balance assessment: Needs assistance Sitting-balance support: Feet supported Sitting balance-Leahy Scale: Good     Standing balance support: Bilateral upper extremity  supported;During functional activity Standing balance-Leahy Scale: Poor Standing balance comment: UE support for balance                            Cognition Arousal/Alertness: Awake/alert Behavior During Therapy: WFL for tasks assessed/performed Overall Cognitive Status: Impaired/Different from baseline Area of Impairment: Problem solving                             Problem Solving: Slow processing;Decreased initiation General Comments: daughter reports pt slower today and lethargic,  Does report he sleeps later at home, but concerned about side effects of a new medication he is on      Exercises General Exercises - Lower Extremity Ankle Circles/Pumps: 10 reps;Supine;Both;AROM Heel Slides: AROM;Both;5 reps;Supine Hip Flexion/Marching: Strengthening;5 reps;Standing;Both Heel Raises: Strengthening;5 reps;Both;Standing    General Comments        Pertinent Vitals/Pain Pain Assessment: No/denies pain    Home Living                      Prior Function            PT Goals (current goals can now be found in the care plan section) Progress towards PT goals: Not progressing toward goals - comment(limited due to orthostatic)    Frequency    Min 2X/week  PT Plan Current plan remains appropriate    Co-evaluation              AM-PAC PT "6 Clicks" Daily Activity  Outcome Measure  Difficulty turning over in bed (including adjusting bedclothes, sheets and blankets)?: A Little Difficulty moving from lying on back to sitting on the side of the bed? : Unable Difficulty sitting down on and standing up from a chair with arms (e.g., wheelchair, bedside commode, etc,.)?: Unable Help needed moving to and from a bed to chair (including a wheelchair)?: A Little Help needed walking in hospital room?: Total Help needed climbing 3-5 steps with a railing? : Total 6 Click Score: 10    End of Session Equipment Utilized During Treatment: Gait  belt Activity Tolerance: Treatment limited secondary to medical complications (Comment)(orthostatic) Patient left: with call bell/phone within reach;in chair;with chair alarm set;with family/visitor present   PT Visit Diagnosis: Unsteadiness on feet (R26.81);Difficulty in walking, not elsewhere classified (R26.2);Muscle weakness (generalized) (M62.81)     Time: 1000-1027 PT Time Calculation (min) (ACUTE ONLY): 27 min  Charges:  $Therapeutic Activity: 23-37 mins                    G CodesMagda Kiel, [place] [phone] 07/12/2017    Reginia Naas 07/12/2017, 12:00 PM

## 2017-07-13 ENCOUNTER — Telehealth: Payer: Self-pay

## 2017-07-13 LAB — BASIC METABOLIC PANEL
Anion gap: 5 (ref 5–15)
BUN: 46 mg/dL — ABNORMAL HIGH (ref 8–23)
CO2: 24 mmol/L (ref 22–32)
Calcium: 8.7 mg/dL — ABNORMAL LOW (ref 8.9–10.3)
Chloride: 113 mmol/L — ABNORMAL HIGH (ref 98–111)
Creatinine, Ser: 1.66 mg/dL — ABNORMAL HIGH (ref 0.61–1.24)
GFR calc Af Amer: 41 mL/min — ABNORMAL LOW (ref >60)
GFR calc non Af Amer: 35 mL/min — ABNORMAL LOW (ref >60)
Glucose, Bld: 119 mg/dL — ABNORMAL HIGH (ref 70–99)
Potassium: 3.9 mmol/L (ref 3.5–5.1)
Sodium: 142 mmol/L (ref 135–145)

## 2017-07-13 LAB — H PYLORI, IGM, IGG, IGA AB
H Pylori IgG: 0.93 Index Value — ABNORMAL HIGH (ref 0.00–0.79)
H. Pylogi, Iga Abs: <9 units (ref 0.0–8.9)
H. Pylogi, Igm Abs: <9 units (ref 0.0–8.9)

## 2017-07-13 LAB — HEMOGLOBIN: Hemoglobin: 9.3 g/dL — ABNORMAL LOW (ref 13.0–17.0)

## 2017-07-13 MED ORDER — ENSURE ENLIVE PO LIQD: 237.0000 mL | Freq: Three times a day (TID) | ORAL | 12 refills | Status: DC

## 2017-07-13 MED ORDER — SUCRALFATE 1 GM/10ML PO SUSP: 1.0000 g | Freq: Three times a day (TID) | ORAL | 0 refills | Status: DC

## 2017-07-13 MED ORDER — ACETAMINOPHEN 325 MG PO TABS: 650.0000 mg | ORAL_TABLET | Freq: Four times a day (QID) | ORAL | Status: AC | PRN

## 2017-07-13 MED ORDER — ADULT MULTIVITAMIN W/MINERALS CH: 1.0000 | ORAL_TABLET | Freq: Every day | ORAL | 0 refills | Status: DC

## 2017-07-13 NOTE — Progress Notes (Signed)
Physical Therapy Treatment Patient Details Name: Alexander Mcdonald MRN: [id number] DOB: 03-22-1926 Today's Date: 07/13/2017    History of Present Illness This 84 year old man was admitted with decreased PO intake, diarrhea and vomiting.  He was recently being treated for pna.  PMH:  prostate CA, CKD, HLD, hypothyroid, HTN.    PT Comments    Patient able to ambulate with RW with assistance for line management and visual impairment in unfamiliar environment. Patient required verbal cues for sequencing of steps and hand placement with sit-to-stand transfers using RW. Pt reports his son is coming into town for a week and is a former EMT. Patient will be living with daughter. Both will be available for assistance. Patient did ask PT if he would do okay going home instead of SNF.     Follow Up Recommendations  SNF(HHPT if patient refuses SNF)     Equipment Recommendations  None recommended by PT    Recommendations for Other Services       Precautions / Restrictions Precautions Precautions: Fall Precaution Comments: watch BP Restrictions Weight Bearing Restrictions: No    Mobility  Bed Mobility Overal bed mobility: Needs Assistance Bed Mobility: Supine to Sit     Supine to sit: HOB elevated;Min assist     General bed mobility comments: assist for trunk  Transfers Overall transfer level: Needs assistance Equipment used: Rolling walker (2 wheeled) Transfers: Sit to/from [facility] Sit to Stand: Min assist Stand pivot transfers: Min guard       General transfer comment: min assist for placement of hands and sequencing of steps for sit to/from stand  Ambulation/Gait Ambulation/Gait assistance: Min guard Gait Distance (Feet): 25 Feet Assistive device: Rolling walker (2 wheeled) Gait Pattern/deviations: Step-through pattern;Trunk flexed;Shuffle;Decreased stride length Gait velocity: decr       Stairs             Wheelchair Mobility    Modified  Rankin (Stroke Patients Only)       Balance Overall balance assessment: Needs assistance Sitting-balance support: Feet supported Sitting balance-Leahy Scale: Good     Standing balance support: Bilateral upper extremity supported;During functional activity Standing balance-Leahy Scale: Poor Standing balance comment: UE support for balance                            Cognition Arousal/Alertness: Awake/alert Behavior During Therapy: WFL for tasks assessed/performed Overall Cognitive Status: Within Functional Limits for tasks assessed                                        Exercises      General Comments        Pertinent Vitals/Pain Pain Assessment: No/denies pain    Home Living                      Prior Function            PT Goals (current goals can now be found in the care plan section) Progress towards PT goals: Progressing toward goals    Frequency    Min 2X/week      PT Plan Current plan remains appropriate    Co-evaluation              AM-PAC PT "6 Clicks" Daily Activity  Outcome Measure  Difficulty turning over in bed (including adjusting bedclothes,  sheets and blankets)?: A Little Difficulty moving from lying on back to sitting on the side of the bed? : A Little Difficulty sitting down on and standing up from a chair with arms (e.g., wheelchair, bedside commode, etc,.)?: A Little Help needed moving to and from a bed to chair (including a wheelchair)?: A Little Help needed walking in hospital room?: A Little Help needed climbing 3-5 steps with a railing? : A Lot 6 Click Score: 17    End of Session Equipment Utilized During Treatment: Gait belt Activity Tolerance: Treatment limited secondary to medical complications (Comment) Patient left: with call bell/phone within reach;in chair;with chair alarm set Nurse Communication: Mobility status PT Visit Diagnosis: Unsteadiness on feet (R26.81);Difficulty in  walking, not elsewhere classified (R26.2);Muscle weakness (generalized) (M62.81)     Time: 1350-1420 PT Time Calculation (min) (ACUTE ONLY): 30 min  Charges:  $Gait Training: 8-22 mins $Self Care/Home Management: 8-22                    G Codes:       Kasra Melvin D. Hartnett-Rands, MS, PT Per [place] [phone] 07/13/2017, 2:35 PM

## 2017-07-13 NOTE — Care Management Important Message (Signed)
Important Message  Patient Details  Name: Alexander Mcdonald MRN: [id number] Date of Birth: 10/15/1926   Medicare Important Message Given:  Yes    Kerin Salen 07/13/2017, 9:53 AMImportant Message  Patient Details  Name: Alexander Mcdonald MRN: [id number] Date of Birth: 02/10/1926   Medicare Important Message Given:  Yes    Kerin Salen 07/13/2017, 9:53 AM

## 2017-07-13 NOTE — Discharge Instructions (Signed)

## 2017-07-13 NOTE — Progress Notes (Addendum)
    Progress Note   Assessment / Plan:   Assessment: 1.  Melena: No further episodes, hemoglobin stable, EGD 07/11/2017 with severe erosive esophagitis and nonbleeding duodenal ulcer 2.  Decreased appetite: Patient did eat well yesterday, again this is at least partially related to severe erosive esophagitis 3.  Anemia: 8.5-->9.3 today, stable  Plan: 1.  H. pylori serology still pending, patient will be notified after discharge of results 2.  Continue twice daily PPI 3.  Discontinue NSAIDs 4.  Continue Carafate 4 times daily 5.  Repeat hemoglobin shows stable today 6. Will arrange for follow up in our office-patient will be notified of appt 7.  We will sign off today, please let us know we can be of any further assistance.  Discharge Planning Diet: patient may return to usual diet Discharge Medications: Continue PPI BID, Carafate QID  Thank you for your kind consultation, we will sign off.   LOS: 6 days   Alexander Mcdonald  07/13/2017, 9:21 AM  Pager # [phone]  Agree with Ms. Lemmon's evaluation and management.  Gatha Mayer, MD, [facility]     Subjective  Chief Complaint: Melena, anemia  This morning found laying in bed, he tells me that his daughter does not want him going home today but rather tomorrow because she would "not be able to take care of him".  Patient tells me that he ate well yesterday and overall is feeling much better.  No further signs of GI bleed per nursing.   Objective   Vital signs in last 24 hours: Temp:  [97.5 F (36.4 C)-97.9 F (36.6 C)] 97.5 F (36.4 C) (06/28 0406) Pulse Rate:  [69-80] 69 (06/28 0406) Resp:  [20-22] 20 (06/28 0406) BP: (110-191)/(42-82) 162/82 (06/28 0412) SpO2:  [100 %] 100 % (06/28 0406) Last BM Date: 07/09/17 General:    Ill-appearing ,white male in NAD Heart:  Regular rate and rhythm; no murmurs Lungs: Respirations even and unlabored, lungs CTA bilaterally Abdomen:  Soft, nontender and nondistended. Normal  bowel sounds. Extremities:  Without edema. Neurologic:  Alert and oriented,  grossly normal neurologically. Psych:  Cooperative. Normal mood and affect.   Lab Results: Recent Labs    07/10/17 1618 07/11/17 0613 07/11/17 1935 07/13/17 0533  WBC  --  14.8*  --   --   HGB 7.7* 7.0* 8.5* 9.3*  HCT 23.1* 20.7* 24.5*  --   PLT  --  210  --   --    BMET Recent Labs    07/11/17 0613 07/13/17 0533  NA 138 142  K 4.3 3.9  CL 114* 113*  CO2 19* 24  GLUCOSE 97 119*  BUN 82* 46*  CREATININE 2.06* 1.66*  CALCIUM 7.9* 8.7*

## 2017-07-13 NOTE — Telephone Encounter (Signed)
-----   Message from Levin Erp, [place] sent at 07/13/2017  9:27 AM EDT ----- Regarding: Follow up jacobs Please arrange for follow up appt with Dr. Maryelizabeth Kaufmann available unless longer than 8 weeks, then me or other app 4-6 weeks.  Thanks-JLL

## 2017-07-13 NOTE — Progress Notes (Signed)
PROGRESS NOTE    Alexander Mcdonald   ACZ:[id number]  DOB: 07-11-1926  DOA: 07/05/2017 PCP: Marletta Lor, MD   Brief Narrative:  Alexander Mcdonald is a 84 y.o.malewith medical history significant ofhypertension, hyperlipidemia, hypothyroidism and other comorbids who presents with decreased p.o. intake, nausea, vomiting, diarrhea. He was recently seen by his primary care physician and was diagnosed with pneumonia and was started on azithromycin. No improvement and was seen again by his primary care physician, was switched to Levaquin at the time. Daughter who was at bedside and provided most of the history who states that patient has had decreased p.o. intake, nausea, vomiting, diarrhea since antibiotic initiation. He also has had productive cough of brown sputum as well as wheezing and shortness of breath.   Daughter says she has also had similar upper respiratory issues which lasted about 3 weeks. He has had no fevers or chills at home, no chest pain or abdominal pain. Diarrhea was loose and watery in nature   In the ED Labs were obtained and revealed sodium 122, creatinine 2.38, lactic acid 1.7, WBC 9.6.    He was referred for admission for hyponatremia as well as poor p.o. intake and was hydrated and treated for Acute Bronchitis.  Marland Kitchen Appetite remained poor but daughter does not want to try any appetite stimulants. His Renal Function and and Sodium function were improving but patient's appetite still remained poor. Patient was noted to get dizzy with standing,  complained of Reflux and had a dark stool.  Nursing states it was dark black. FOBT was positive and Hb/Hct dropped and suspecting Upper GIB.   Subjective: He did not have breakfast as he does not feel like eating. He has not had any further heartburn.   Assessment & Plan:   Principal Problem:   GI bleed   Acute blood loss anemia and orthostatic hypotension - + FOBT and dark stools (Melena) - Hb dropped from 14.5  steadily to 7.0 on 6/26- tranfused  1 U PRBC  - Iron level normal - EGD 6/26 - shows severe esophagitis, duodenal ulcer and mild duodenal narrowing - plan> PPI, Carafate, full liquids, cont to hold aspirin  - Check H pylori- pending - added Gi Cocktail, Ensure and encourage full liquid diet - cont IVF- he still has poor oral intake today with a very poor appetite chest pain has improved  Nausea/ vomiting/diarrhea - since being on antibiotics or pneumonia- upper GI symptoms may be related to above PUD - see above plan    Hyperlipidemia - cont Lipitor    Essential hypertension - Norvasc, Monopril on hold- will resume today as BP is elevated  BPH - Flomax    Chronic kidney disease stage 4 - stable    Hyponatremia/ dehydration - improved with IVF    Hypothyroidism - synthroid    DVT prophylaxis: SCDs Code Status: Full code Family Communication:  Disposition Plan: home vs SNF Consultants:   GI Procedures:   EGD: Impression:               - Severe erosive esophagitis. Biopsied. Cells for                            cytology obtained. Suspect reflux ? infection                           - One non-bleeding duodenal ulcer with no stigmata  of bleeding. Suspect this was cause of melena - no                            stigmata to do endoscopic tx seen                           - Acquired duodenal stenosis. From ulcer edema                           - The examination was otherwise normal.    Antimicrobials:  Anti-infectives (From admission, onward)   None       Objective: Vitals:   07/12/17 1623 07/12/17 1949 07/13/17 0406 07/13/17 0412  BP: (!) 154/54 (!) 160/61 (!) 191/62 (!) 162/82  Pulse:  76 69   Resp:  (!) 22 20   Temp:  97.7 F (36.5 C) (!) 97.5 F (36.4 C)   TempSrc:  Oral Oral   SpO2:  100% 100%   Weight:      Height:        Intake/Output Summary (Last 24 hours) at 07/13/2017 1026 Last data filed at 07/13/2017 0358 Gross per  24 hour  Intake -  Output 750 ml  Net -750 ml   Filed Weights   07/05/17 1156 07/11/17 1255  Weight: 73 kg (161 lb) 73 kg (161 lb)    Examination: General exam: Appears comfortable  HEENT: PERRLA, oral mucosa moist, no sclera icterus or thrush Respiratory system: Clear to auscultation. Respiratory effort normal. Cardiovascular system: S1 & S2 heard,  No murmurs  Gastrointestinal system: Abdomen soft, non-tender, nondistended. Normal bowel sound. No organomegaly Central nervous system: Alert and oriented. No focal neurological deficits. Extremities: No cyanosis, clubbing or edema Skin: No rashes or ulcers Psychiatry:  Mood & affect appropriate.      Data Reviewed: I have personally reviewed following labs and imaging studies  CBC: Recent Labs  Lab 07/07/17 0559 07/08/17 0537 07/09/17 0526 07/10/17 0557 07/10/17 1618 07/11/17 0613 07/11/17 1935 07/13/17 0533  WBC 15.4* 11.3* 11.8* 10.5  --  14.8*  --   --   NEUTROABS 13.1* 9.6* 9.9* 8.7  --  11.6*  --   --   HGB 13.2 13.2 12.0* 8.6* 7.7* 7.0* 8.5* 9.3*  HCT 36.3* 36.4* 34.0* 24.6* 23.1* 20.7* 24.5*  --   MCV 82.5 83.3 84.4 87.2  --  91.2  --   --   PLT 233 222 219 229  --  210  --   --    Basic Metabolic Panel: Recent Labs  Lab 07/07/17 0559 07/08/17 0537 07/09/17 0526 07/10/17 0557 07/11/17 0613 07/13/17 0533  NA 128* 132* 132* 136 138 142  K 3.9 4.2 4.2 4.7 4.3 3.9  CL 99* 103 105 111 114* 113*  CO2 21* 22 21* 21* 19* 24  GLUCOSE 112* 112* 108* 136* 97 119*  BUN 31* 31* 29* 71* 82* 46*  CREATININE 2.00* 1.77* 1.51* 1.87* 2.06* 1.66*  CALCIUM 8.4* 8.4* 8.0* 8.2* 7.9* 8.7*  MG 1.8 2.1 2.0 2.2 2.2  --   PHOS 3.2 3.0 2.5 2.8 3.4  --    GFR: Estimated Creatinine Clearance: 26.2 mL/min (A) (by C-G formula based on SCr of 1.66 mg/dL (H)). Liver Function Tests: Recent Labs  Lab 07/07/17 0559 07/08/17 0537 07/09/17 0526 07/10/17 0557 07/11/17 0613  AST 39 38 35 24 24  ALT  36 38 39 30 25  ALKPHOS 59  55 51 40 34*  BILITOT 1.1 0.7 0.7 0.4 0.5  PROT 6.2* 5.9* 5.4* 4.7* 4.4*  ALBUMIN 3.3* 3.1* 2.9* 2.6* 2.5*   No results for input(s): LIPASE, AMYLASE in the last 168 hours. No results for input(s): AMMONIA in the last 168 hours. Coagulation Profile: No results for input(s): INR, PROTIME in the last 168 hours. Cardiac Enzymes: No results for input(s): CKTOTAL, CKMB, CKMBINDEX, TROPONINI in the last 168 hours. BNP (last 3 results) No results for input(s): PROBNP in the last 8760 hours. HbA1C: No results for input(s): HGBA1C in the last 72 hours. CBG: No results for input(s): GLUCAP in the last 168 hours. Lipid Profile: No results for input(s): CHOL, HDL, LDLCALC, TRIG, CHOLHDL, LDLDIRECT in the last 72 hours. Thyroid Function Tests: No results for input(s): TSH, T4TOTAL, FREET4, T3FREE, THYROIDAB in the last 72 hours. Anemia Panel: No results for input(s): VITAMINB12, FOLATE, FERRITIN, TIBC, IRON, RETICCTPCT in the last 72 hours. Urine analysis:    Component Value Date/Time   COLORURINE STRAW (A) 07/10/2017 1007   APPEARANCEUR CLEAR 07/10/2017 1007   LABSPEC 1.014 07/10/2017 1007   PHURINE 5.0 07/10/2017 1007   GLUCOSEU NEGATIVE 07/10/2017 1007   HGBUR NEGATIVE 07/10/2017 1007   HGBUR large 12/17/2006 1056   BILIRUBINUR NEGATIVE 07/10/2017 1007   BILIRUBINUR n 01/30/2014 1508   KETONESUR NEGATIVE 07/10/2017 1007   PROTEINUR NEGATIVE 07/10/2017 1007   UROBILINOGEN 0.2 06/01/2014 1448   NITRITE NEGATIVE 07/10/2017 1007   LEUKOCYTESUR TRACE (A) 07/10/2017 1007   Sepsis Labs: @LABRCNTIP (procalcitonin:4,lacticidven:4) ) Recent Results (from the past 240 hour(s))  Culture, Urine     Status: None   Collection Time: 07/05/17  4:57 PM  Result Value Ref Range Status   Specimen Description   Final    URINE, RANDOM Performed at [place] [address] [place], [place] [address]    Special Requests   Final    NONE Performed at [facility], [place] [address] [place], [place] [address]    Culture   Final    NO GROWTH Performed at [facility], [place] [address], [place], [place] [address]    Report Status 07/07/2017 FINAL  Final  Respiratory Panel by PCR     Status: None   Collection Time: 07/06/17 10:05 AM  Result Value Ref Range Status   Adenovirus NOT DETECTED NOT DETECTED Final   Coronavirus 229E NOT DETECTED NOT DETECTED Final   Coronavirus HKU1 NOT DETECTED NOT DETECTED Final   Coronavirus NL63 NOT DETECTED NOT DETECTED Final   Coronavirus OC43 NOT DETECTED NOT DETECTED Final   Metapneumovirus NOT DETECTED NOT DETECTED Final   Rhinovirus / Enterovirus NOT DETECTED NOT DETECTED Final   Influenza A NOT DETECTED NOT DETECTED Final   Influenza B NOT DETECTED NOT DETECTED Final   Parainfluenza Virus 1 NOT DETECTED NOT DETECTED Final   Parainfluenza Virus 2 NOT DETECTED NOT DETECTED Final   Parainfluenza Virus 3 NOT DETECTED NOT DETECTED Final   Parainfluenza Virus 4 NOT DETECTED NOT DETECTED Final   Respiratory Syncytial Virus NOT DETECTED NOT DETECTED Final   Bordetella pertussis NOT DETECTED NOT DETECTED Final   Chlamydophila pneumoniae NOT DETECTED NOT DETECTED Final   Mycoplasma pneumoniae NOT DETECTED NOT DETECTED Final  Culture, Urine     Status: Abnormal   Collection Time: 07/10/17 12:41 AM  Result Value Ref Range Status   Specimen Description   Final  URINE, RANDOM Performed at [facility], [place] [address], [place], [place] [address]    Special Requests   Final    NONE Performed at [facility], [place] [address] [place], [place] [address]    Culture (A)  Final    <10,000 COLONIES/mL INSIGNIFICANT GROWTH Performed at [facility] [address], [place], [place] [address]    Report Status 07/11/2017 FINAL  Final         Radiology Studies: No results found.    Scheduled Meds: . amLODipine  2.5 mg Oral Daily  . atorvastatin  10 mg Oral  q1800  . atropine  1 drop Right Eye QHS  . brinzolamide  1 drop Both Eyes BID  . chlorhexidine  15 mL Mouth Rinse BID  . feeding supplement (ENSURE ENLIVE)  237 mL Oral TID BM  . latanoprost  1 drop Left Eye QHS  . levothyroxine  50 mcg Oral QAC breakfast  . lisinopril  20 mg Oral Daily  . mouth rinse  15 mL Mouth Rinse q12n4p  . multivitamin with minerals  1 tablet Oral Daily  . pantoprazole (PROTONIX) IV  40 mg Intravenous Q12H  . sucralfate  1 g Oral TID WC & HS  . tamsulosin  0.4 mg Oral Daily   Continuous Infusions: . sodium chloride 50 mL/hr at 07/12/17 2055     LOS: 6 days    Time spent in minutes: 35     Debbe Odea, MD [facility] Pager: [email or web address] Password [facility] 07/13/2017, 10:26 AM

## 2017-07-13 NOTE — Telephone Encounter (Signed)
appt made with Ellouise Newer on 08/13/17 at 9:15 am pt to be notified at discharge.

## 2017-07-14 DIAGNOSIS — K2210 Ulcer of esophagus without bleeding: Secondary | ICD-10-CM

## 2017-07-14 NOTE — Discharge Summary (Addendum)
Physician Discharge Summary  Alexander Mcdonald:[id number] DOB: 1926/05/07 DOA: 07/05/2017  PCP: Marletta Lor, MD  Admit date: 07/05/2017 Discharge date: 07/14/2017  Admitted From: home  Disposition:  Home with daughter   Recommendations for Outpatient Follow-up:  1. Check Hb/ HCT and Iron levels again in a few weeks 2. Consider Aranesp and nephrology referral for advanced CKD    Discharge Condition:  stable   CODE STATUS:  Full code   Diet recommendation:  Bland, Soft, easy to digest diet  Consultations:  GI    Discharge Diagnoses:  Principal Problem:   GI bleed Active Problems:   Acute blood loss anemia   Duodenal ulcer with hemorrhage and obstruction   Erosive esophagitis   HYPERCHOLESTEROLEMIA   Essential hypertension   Chronic kidney disease   Hyponatremia   Hypothyroidism   Nausea and vomiting     Brief Summary: Alexander Mcdonald is a 84 y.o.malewith medical history significant ofhypertension, hyperlipidemia, hypothyroidism and other comorbids who presents with decreased p.o. intake, nausea, vomiting, diarrhea. He was recently seen by his primary care physician and was diagnosed with pneumonia and was started on azithromycin. No improvement and was seen again by his primary care physician, was switched to Levaquin at the time. Daughterwhowas at bedside and provided most of the history who states that patient has had decreased p.o. intake, nausea, vomiting, diarrhea since antibiotic initiation.He also has had productive cough of brown sputum as well as wheezing and shortness of breath.   Daughter says she has also had similar upper respiratory issues which lasted about 3 weeks. He has had no fevers or chills at home, no chest pain or abdominal pain. Diarrheawasloose and watery in nature   In the ED Labs were obtained and revealed sodium 122, creatinine 2.38, lactic acid 1.7, WBC 9.6.    He was referred for admission for hyponatremia as well as poor  p.o. intake and was hydrated and treated for Acute Bronchitis.  Marland Kitchen Appetite remained poor but daughter does not want to try any appetite stimulants. His Renal Function and and Sodium functionwereimproving but patient's appetite still remainedpoor. Patient was noted to get dizzy with standing,  complained of Reflux and had a dark stool.  Nursing states it was dark black.FOBT was positive and Hb/Hct dropped and suspecting Upper GIB.     Hospital Course:  Principal Problem:   GI bleed   Acute blood loss anemia and orthostatic hypotension - + FOBT and dark stools (Melena) - Hb dropped from 14.5 steadily to 7.0 on 6/26- tranfused  1 U PRBC  and Hb up to 9.3 - Iron level normal - EGD 6/26 - shows severe esophagitis, duodenal ulcer and mild duodenal narrowing - plan> PPI, Carafate, full liquids, cont to hold aspirin  -  H pylori is negative - added GI Cocktail, Ensure and encouraged full liquid diet - he is drinking Ensure, had cream of wheat and a magic cup for breakfast today- ok to d/c home- can advance to soft diet at home  Nausea/ vomiting/diarrhea - has been occurring since being on antibiotics for pneumonia - upper GI symptoms may be related to above PUD which may have been an underlying issue exacerbated by the stress of the pneumonia - see above plan    Hyperlipidemia - cont Lipitor    Essential hypertension - Norvasc, Monopril   BPH - Flomax    Chronic kidney disease stage 3-4 - stable- he needs to have an oupt nephrologist and will likely need Aranesp  injections    Hyponatremia/ dehydration - improved with IVF    Hypothyroidism - synthroid   Discharge Exam: Vitals:   07/13/17 2010 07/14/17 0427  BP: (!) 130/42 (!) 148/52  Pulse: 76 76  Resp: 20 18  Temp: 97.7 F (36.5 C) 97.7 F (36.5 C)  SpO2: 97% 99%   Vitals:   07/13/17 0412 07/13/17 1319 07/13/17 2010 07/14/17 0427  BP: (!) 162/82 (!) 145/48 (!) 130/42 (!) 148/52  Pulse:  68 76 76  Resp:  16  20 18   Temp:  97.9 F (36.6 C) 97.7 F (36.5 C) 97.7 F (36.5 C)  TempSrc:  Oral Oral Oral  SpO2:  99% 97% 99%  Weight:      Height:        General: Pt is alert, awake, not in acute distress Cardiovascular: RRR, S1/S2 +, no rubs, no gallops Respiratory: CTA bilaterally, no wheezing, no rhonchi Abdominal: Soft, NT, ND, bowel sounds + Extremities: no edema, no cyanosis   Discharge Instructions  Discharge Instructions    Diet - low sodium heart healthy   Complete by:  As directed    Discharge instructions   Complete by:  As directed    Do not take Alleve, Ibuprofen or Aspirin.  After 1 wk you can start Aspirin 81 mg daily.  Full liquid diet for 3-4 days and then start introducing solid food. Avoid acid (juice), Caffeine and spicy food.   Increase activity slowly   Complete by:  As directed      Allergies as of 07/14/2017      Reactions   Shellfish Allergy Anaphylaxis   Pt is allergic to mussels    Brimonidine Itching   Itching and swelling red   Dorzolamide Hcl-timolol Mal Other (See Comments)   Eye itch and redness   Timolol Itching   Itching rash under eyes.      Medication List    STOP taking these medications   aspirin EC 81 MG tablet   levofloxacin 500 MG tablet Commonly known as:  LEVAQUIN     TAKE these medications   acetaminophen 325 MG tablet Commonly known as:  TYLENOL Take 2 tablets (650 mg total) by mouth every 6 (six) hours as needed for mild pain (or Fever >/= 101).   amLODipine 2.5 MG tablet Commonly known as:  NORVASC TAKE 1 TABLET BY MOUTH EVERY DAY   atorvastatin 10 MG tablet Commonly known as:  LIPITOR TAKE 1 TABLET(10 MG) BY MOUTH DAILY   atropine 1 % ophthalmic solution Place 1 drop into the right eye at bedtime.   AZOPT 1 % ophthalmic suspension Generic drug:  brinzolamide Place 1 drop into both eyes 2 (two) times daily.   feeding supplement (ENSURE ENLIVE) Liqd Take 237 mLs by mouth 3 (three) times daily between meals.    fish oil-omega-3 fatty acids 1000 MG capsule Take 1 g by mouth 2 (two) times daily.   fluticasone 50 MCG/ACT nasal spray Commonly known as:  FLONASE Place 2 sprays into both nostrils daily.   fosinopril 20 MG tablet Commonly known as:  MONOPRIL TAKE 1 TABLET BY MOUTH EVERY DAY   levocetirizine 5 MG tablet Commonly known as:  XYZAL Take 1 tablet (5 mg total) by mouth every evening.   levothyroxine 50 MCG tablet Commonly known as:  SYNTHROID, LEVOTHROID Take 1 tablet (50 mcg total) daily by mouth.   multivitamin with minerals Tabs tablet Take 1 tablet by mouth daily.   MYRBETRIQ 25 MG Tb24 tablet Generic  drug:  mirabegron ER TAKE 1 TABLET BY MOUTH DAILY   NONFORMULARY OR COMPOUNDED ITEM [facility]: Peripheral Neuropathy Cream - Bupivacaine 1%, Doxepin 3%, Gabapentin 6%, Pentoxifylline 3%, Topiramate 1%, apply 1-2 grams to affected area 3-4 times daily.   sucralfate 1 GM/10ML suspension Commonly known as:  CARAFATE Take 10 mLs (1 g total) by mouth 4 (four) times daily -  with meals and at bedtime.   tamsulosin 0.4 MG Caps capsule Commonly known as:  FLOMAX TAKE ONE CAPSULE BY MOUTH EVERY DAY   TRAVATAN Z 0.004 % Soln ophthalmic solution Generic drug:  Travoprost (BAK Free) Place 1 drop into the left eye every evening.   URIBEL 118 MG Caps Take 1 capsule by mouth 3 (three) times daily as needed (urinary pain).            Durable Medical Equipment  (From admission, onward)        Start     Ordered   07/13/17 1039  For home use only DME Walker rolling  Once    Question:  Patient needs a walker to treat with the following condition  Answer:  Weakness   07/13/17 1038   07/13/17 1037  For home use only DME Bedside commode  Once    Question:  Patient needs a bedside commode to treat with the following condition  Answer:  Weakness   07/13/17 1038     Follow-up Information    Marletta Lor, MD. Schedule an appointment as soon as possible for a visit  in 2 week(s).   Specialty:  Internal Medicine Contact information: [place] [address] [phone]        Gatha Mayer, MD Follow up.   Specialty:  Gastroenterology Contact information: [address] [place] [address] [phone]          Allergies  Allergen Reactions  . Shellfish Allergy Anaphylaxis    Pt is allergic to mussels   . Brimonidine Itching    Itching and swelling red  . Dorzolamide Hcl-Timolol Mal Other (See Comments)    Eye itch and redness  . Timolol Itching    Itching rash under eyes.     Procedures/Studies:  EGD  Severe erosive esophagitis. Biopsied. Cells for                            cytology obtained. Suspect reflux ? infection                           - One non-bleeding duodenal ulcer with no stigmata                            of bleeding. Suspect this was cause of melena - no                            stigmata to do endoscopic tx seen                           - Acquired duodenal stenosis. From ulcer edema                           - The examination was otherwise normal.    Dg Chest 2 View  Result Date:  07/05/2017 CLINICAL DATA:  Shortness of breath EXAM: CHEST - 2 VIEW COMPARISON:  06/29/2017 FINDINGS: Borderline heart size. Negative aortic and hilar contours. There is no edema, air bronchogram, effusion, or pneumothorax. Syndesmophytes with multilevel thoracic ankylosis. No acute osseous finding. IMPRESSION: 1. No convincing pneumonia or other acute disease today. 2. Findings of ankylosing spondylitis in the thoracic spine. Electronically Signed   By: Monte Fantasia M.D.   On: 07/05/2017 13:38   Dg Chest 2 View  Result Date: 06/29/2017 CLINICAL DATA:  Productive cough and wheezing. EXAM: CHEST - 2 VIEW COMPARISON:  December 26, 2011 FINDINGS: Minimal haziness in the left base could represent subtle infiltrate given history. No other interval changes since February 08, 2007. Probable ankylosing  spondylitis in the thoracic spine. IMPRESSION: 1. Possible subtle infiltrate in the left base. Recommend follow-up to resolution. 2. Probable ankylosing spondylitis with flowing osteophytes on the lateral view. This finding is stable. Electronically Signed   By: Dorise Bullion III M.D   On: 06/29/2017 16:01   Dg Chest Port 1 View  Result Date: 07/08/2017 CLINICAL DATA:  Short of breath.  Follow-up exam. EXAM: PORTABLE CHEST 1 VIEW COMPARISON:  07/07/2017 and older exams. FINDINGS: Opacity noted at the left lateral lung base on the most recent prior study has improved. The original subtle opacity at the left base noted on 06/29/2017 has also improved. Lungs now appear clear. Lungs remain hyperexpanded. No convincing pleural effusion.  No pneumothorax. Heart, mediastinum and hila are unremarkable. IMPRESSION: 1. Improved lung aeration. Left lung base opacity noted on the prior study has essentially resolved consistent with resolved atelectasis. No convincing residual pneumonia. No current acute cardiopulmonary disease. Electronically Signed   By: Lajean Manes M.D.   On: 07/08/2017 07:34   Dg Chest Port 1 View  Result Date: 07/07/2017 CLINICAL DATA:  Shortness of breath. EXAM: PORTABLE CHEST 1 VIEW COMPARISON:  07/05/2017 FINDINGS: The patient is mildly rotated to the right. The cardiac silhouette is borderline enlarged. Aortic atherosclerosis is noted. Lung volumes are lower than on the prior study. There is increased airspace opacity in the left lung base, predominantly laterally. A small left pleural effusion is not excluded. No pneumothorax is identified. IMPRESSION: New left basilar opacity which may reflect atelectasis or pneumonia. Electronically Signed   By: Logan Bores M.D.   On: 07/07/2017 07:18     The results of significant diagnostics from this hospitalization (including imaging, microbiology, ancillary and laboratory) are listed below for reference.     Microbiology: Recent Results (from  the past 240 hour(s))  Culture, Urine     Status: None   Collection Time: 07/05/17  4:57 PM  Result Value Ref Range Status   Specimen Description   Final    URINE, RANDOM Performed at [facility] [address], [place], [place] [address]    Special Requests   Final    NONE Performed at [facility], [place] [address] [place], [place] [address]    Culture   Final    NO GROWTH Performed at [facility], [place] [address], [place], [place] [address]    Report Status 07/07/2017 FINAL  Final  Respiratory Panel by PCR     Status: None   Collection Time: 07/06/17 10:05 AM  Result Value Ref Range Status   Adenovirus NOT DETECTED NOT DETECTED Final   Coronavirus 229E NOT DETECTED NOT DETECTED Final   Coronavirus HKU1 NOT DETECTED NOT DETECTED Final   Coronavirus NL63 NOT  DETECTED NOT DETECTED Final   Coronavirus OC43 NOT DETECTED NOT DETECTED Final   Metapneumovirus NOT DETECTED NOT DETECTED Final   Rhinovirus / Enterovirus NOT DETECTED NOT DETECTED Final   Influenza A NOT DETECTED NOT DETECTED Final   Influenza B NOT DETECTED NOT DETECTED Final   Parainfluenza Virus 1 NOT DETECTED NOT DETECTED Final   Parainfluenza Virus 2 NOT DETECTED NOT DETECTED Final   Parainfluenza Virus 3 NOT DETECTED NOT DETECTED Final   Parainfluenza Virus 4 NOT DETECTED NOT DETECTED Final   Respiratory Syncytial Virus NOT DETECTED NOT DETECTED Final   Bordetella pertussis NOT DETECTED NOT DETECTED Final   Chlamydophila pneumoniae NOT DETECTED NOT DETECTED Final   Mycoplasma pneumoniae NOT DETECTED NOT DETECTED Final  Culture, Urine     Status: Abnormal   Collection Time: 07/10/17 12:41 AM  Result Value Ref Range Status   Specimen Description   Final    URINE, RANDOM Performed at [facility] [address], [place], [place] [address]    Special Requests   Final    NONE Performed at [facility], [place] [address],  [place], [place] [address]    Culture (A)  Final    <10,000 COLONIES/mL INSIGNIFICANT GROWTH Performed at [facility] [address], [place], [place] [address]    Report Status 07/11/2017 FINAL  Final     Labs: BNP (last 3 results) No results for input(s): BNP in the last 8760 hours. Basic Metabolic Panel: Recent Labs  Lab 07/08/17 0537 07/09/17 0526 07/10/17 0557 07/11/17 0613 07/13/17 0533  NA 132* 132* 136 138 142  K 4.2 4.2 4.7 4.3 3.9  CL 103 105 111 114* 113*  CO2 22 21* 21* 19* 24  GLUCOSE 112* 108* 136* 97 119*  BUN 31* 29* 71* 82* 46*  CREATININE 1.77* 1.51* 1.87* 2.06* 1.66*  CALCIUM 8.4* 8.0* 8.2* 7.9* 8.7*  MG 2.1 2.0 2.2 2.2  --   PHOS 3.0 2.5 2.8 3.4  --    Liver Function Tests: Recent Labs  Lab 07/08/17 0537 07/09/17 0526 07/10/17 0557 07/11/17 0613  AST 38 35 24 24  ALT 38 39 30 25  ALKPHOS 55 51 40 34*  BILITOT 0.7 0.7 0.4 0.5  PROT 5.9* 5.4* 4.7* 4.4*  ALBUMIN 3.1* 2.9* 2.6* 2.5*   No results for input(s): LIPASE, AMYLASE in the last 168 hours. No results for input(s): AMMONIA in the last 168 hours. CBC: Recent Labs  Lab 07/08/17 0537 07/09/17 0526 07/10/17 0557 07/10/17 1618 07/11/17 0613 07/11/17 1935 07/13/17 0533  WBC 11.3* 11.8* 10.5  --  14.8*  --   --   NEUTROABS 9.6* 9.9* 8.7  --  11.6*  --   --   HGB 13.2 12.0* 8.6* 7.7* 7.0* 8.5* 9.3*  HCT 36.4* 34.0* 24.6* 23.1* 20.7* 24.5*  --   MCV 83.3 84.4 87.2  --  91.2  --   --   PLT 222 219 229  --  210  --   --    Cardiac Enzymes: No results for input(s): CKTOTAL, CKMB, CKMBINDEX, TROPONINI in the last 168 hours. BNP: Invalid input(s): POCBNP CBG: No results for input(s): GLUCAP in the last 168 hours. D-Dimer No results for input(s): DDIMER in the last 72 hours. Hgb A1c No results for input(s): HGBA1C in the last 72 hours. Lipid Profile No results for input(s): CHOL, HDL, LDLCALC, TRIG, CHOLHDL, LDLDIRECT in the last 72 hours. Thyroid function studies No results for  input(s):  TSH, T4TOTAL, T3FREE, THYROIDAB in the last 72 hours.  Invalid input(s): FREET3 Anemia work up No results for input(s): VITAMINB12, FOLATE, FERRITIN, TIBC, IRON, RETICCTPCT in the last 72 hours. Urinalysis    Component Value Date/Time   COLORURINE STRAW (A) 07/10/2017 1007   APPEARANCEUR CLEAR 07/10/2017 1007   LABSPEC 1.014 07/10/2017 1007   PHURINE 5.0 07/10/2017 1007   GLUCOSEU NEGATIVE 07/10/2017 1007   HGBUR NEGATIVE 07/10/2017 1007   HGBUR large 12/17/2006 1056   BILIRUBINUR NEGATIVE 07/10/2017 1007   BILIRUBINUR n 01/30/2014 1508   KETONESUR NEGATIVE 07/10/2017 1007   PROTEINUR NEGATIVE 07/10/2017 1007   UROBILINOGEN 0.2 06/01/2014 1448   NITRITE NEGATIVE 07/10/2017 1007   LEUKOCYTESUR TRACE (A) 07/10/2017 1007   Sepsis Labs Invalid input(s): PROCALCITONIN,  WBC,  LACTICIDVEN Microbiology Recent Results (from the past 240 hour(s))  Culture, Urine     Status: None   Collection Time: 07/05/17  4:57 PM  Result Value Ref Range Status   Specimen Description   Final    URINE, RANDOM Performed at [facility], [place] [address], [place], [place] [address]    Special Requests   Final    NONE Performed at [facility], [place] [address], [place], [place] [address]    Culture   Final    NO GROWTH Performed at [facility], [place] [address], [place], [place] [address]    Report Status 07/07/2017 FINAL  Final  Respiratory Panel by PCR     Status: None   Collection Time: 07/06/17 10:05 AM  Result Value Ref Range Status   Adenovirus NOT DETECTED NOT DETECTED Final   Coronavirus 229E NOT DETECTED NOT DETECTED Final   Coronavirus HKU1 NOT DETECTED NOT DETECTED Final   Coronavirus NL63 NOT DETECTED NOT DETECTED Final   Coronavirus OC43 NOT DETECTED NOT DETECTED Final   Metapneumovirus NOT DETECTED NOT DETECTED Final   Rhinovirus / Enterovirus NOT DETECTED NOT DETECTED Final   Influenza A NOT DETECTED NOT DETECTED Final    Influenza B NOT DETECTED NOT DETECTED Final   Parainfluenza Virus 1 NOT DETECTED NOT DETECTED Final   Parainfluenza Virus 2 NOT DETECTED NOT DETECTED Final   Parainfluenza Virus 3 NOT DETECTED NOT DETECTED Final   Parainfluenza Virus 4 NOT DETECTED NOT DETECTED Final   Respiratory Syncytial Virus NOT DETECTED NOT DETECTED Final   Bordetella pertussis NOT DETECTED NOT DETECTED Final   Chlamydophila pneumoniae NOT DETECTED NOT DETECTED Final   Mycoplasma pneumoniae NOT DETECTED NOT DETECTED Final  Culture, Urine     Status: Abnormal   Collection Time: 07/10/17 12:41 AM  Result Value Ref Range Status   Specimen Description   Final    URINE, RANDOM Performed at [facility] [address], [place], [place] [address]    Special Requests   Final    NONE Performed at [facility], [place] [address], [place], [place] [address]    Culture (A)  Final    <10,000 COLONIES/mL INSIGNIFICANT GROWTH Performed at [facility] [address] [place], [place] [address]    Report Status 07/11/2017 FINAL  Final     Time coordinating discharge in minutes: 65  SIGNED:   Debbe Odea, MD  [facility] 07/14/2017, 10:49 AM Pager   If 7PM-7AM, please contact night-coverage [email or web address] Password [email or web address]

## 2017-07-14 NOTE — Progress Notes (Signed)
Nurse reviewed discharge instructions with pt and his family.  Both verbalized understanding of discharge instructions, follow up appointments and new medications. Prescription given to pt's family prior to discharge.

## 2017-07-15 DIAGNOSIS — M6281 Muscle weakness (generalized): Secondary | ICD-10-CM | POA: Diagnosis not present

## 2017-07-15 DIAGNOSIS — H5410 Blindness, one eye, low vision other eye, unspecified eyes: Secondary | ICD-10-CM | POA: Diagnosis not present

## 2017-07-15 DIAGNOSIS — I129 Hypertensive chronic kidney disease with stage 1 through stage 4 chronic kidney disease, or unspecified chronic kidney disease: Secondary | ICD-10-CM | POA: Diagnosis not present

## 2017-07-15 DIAGNOSIS — N183 Chronic kidney disease, stage 3 (moderate): Secondary | ICD-10-CM | POA: Diagnosis not present

## 2017-07-15 DIAGNOSIS — R261 Paralytic gait: Secondary | ICD-10-CM | POA: Diagnosis not present

## 2017-07-15 LAB — TYPE AND SCREEN
ABO/RH(D): A NEG
Antibody Screen: NEGATIVE
Unit division: 0
Unit division: 0

## 2017-07-15 LAB — BPAM RBC
Blood Product Expiration Date: 201907152359
Blood Product Expiration Date: 201907212359
ISSUE DATE / TIME: 201906261142
Unit Type and Rh: 600
Unit Type and Rh: 600

## 2017-07-16 ENCOUNTER — Telehealth: Payer: Self-pay | Admitting: Internal Medicine

## 2017-07-16 DIAGNOSIS — N183 Chronic kidney disease, stage 3 (moderate): Secondary | ICD-10-CM | POA: Diagnosis not present

## 2017-07-16 DIAGNOSIS — I129 Hypertensive chronic kidney disease with stage 1 through stage 4 chronic kidney disease, or unspecified chronic kidney disease: Secondary | ICD-10-CM | POA: Diagnosis not present

## 2017-07-16 DIAGNOSIS — H5410 Blindness, one eye, low vision other eye, unspecified eyes: Secondary | ICD-10-CM | POA: Diagnosis not present

## 2017-07-16 DIAGNOSIS — R261 Paralytic gait: Secondary | ICD-10-CM | POA: Diagnosis not present

## 2017-07-16 DIAGNOSIS — M6281 Muscle weakness (generalized): Secondary | ICD-10-CM | POA: Diagnosis not present

## 2017-07-16 NOTE — Telephone Encounter (Signed)
Copied from Seth Ward [phone]. Topic: Quick Communication - See Telephone Encounter >> Jul 16, 2017  3:35 PM Rutherford Nail, NT wrote: CRM for notification. See Telephone encounter for: 07/16/17. Will Schalla, OT with [facility] calling to requesting verbal orders for occupational therapy for the following: 2x a week for 2 weeks 1x a week for 1 week CB#: [phone]

## 2017-07-16 NOTE — Telephone Encounter (Signed)
Copied from [place] [phone]. Topic: Quick Communication - See Telephone Encounter >> Jul 16, 2017  3:12 PM Burchel, Abbi R wrote:  See Telephone encounter for: 07/16/17.  [facility])  completed PT Eval today and states that he will continue care.  Please call with V/O.  [phone]

## 2017-07-17 ENCOUNTER — Telehealth: Payer: Self-pay | Admitting: Internal Medicine

## 2017-07-17 DIAGNOSIS — N183 Chronic kidney disease, stage 3 unspecified: Secondary | ICD-10-CM

## 2017-07-17 NOTE — Telephone Encounter (Signed)
Okay for verbal orders. 

## 2017-07-17 NOTE — Telephone Encounter (Signed)
LM for TCM follow up

## 2017-07-17 NOTE — Telephone Encounter (Signed)
Okay for verbal orders? Please advise 

## 2017-07-18 DIAGNOSIS — H5410 Blindness, one eye, low vision other eye, unspecified eyes: Secondary | ICD-10-CM | POA: Diagnosis not present

## 2017-07-18 DIAGNOSIS — M6281 Muscle weakness (generalized): Secondary | ICD-10-CM | POA: Diagnosis not present

## 2017-07-18 DIAGNOSIS — R261 Paralytic gait: Secondary | ICD-10-CM | POA: Diagnosis not present

## 2017-07-18 DIAGNOSIS — I129 Hypertensive chronic kidney disease with stage 1 through stage 4 chronic kidney disease, or unspecified chronic kidney disease: Secondary | ICD-10-CM | POA: Diagnosis not present

## 2017-07-18 DIAGNOSIS — N183 Chronic kidney disease, stage 3 (moderate): Secondary | ICD-10-CM | POA: Diagnosis not present

## 2017-07-18 NOTE — Telephone Encounter (Signed)
Unable to reach patient at time of TCM Call. Left message for patient to return call when available.  

## 2017-07-18 NOTE — Telephone Encounter (Signed)
Daughter [name] would like to have patient referred to Nephrology by Dr Raliegh Ip Declined scheduling HFU at this time.  [name] wants to bypass the HFU with our office at this time and just see Nephrology.  No referral was placed while patient was admitted.   [name] call back # [phone]

## 2017-07-18 NOTE — Telephone Encounter (Signed)
Okay for nephrology referral Encourage hospital follow-up visit

## 2017-07-18 NOTE — Telephone Encounter (Signed)
PCP: Marletta Lor, MD  Admit date: 07/05/2017 Discharge date: 07/14/2017  Admitted From: home  Disposition:  Home with daughter   Recommendations for Outpatient Follow-up:  1. Check Hb/ HCT and Iron levels again in a few weeks 2. Consider Aranesp and nephrology referral for advanced CKD    Discharge Condition:  stable   CODE STATUS:  Full code   Diet recommendation:  Bland, Soft, easy to digest diet  Consultations:  GI    Transition Care Management Follow-up Telephone Call   Date discharged? 07/14/17, to daughters home.   How have you been since you were released from the hospital? Spoke with daughter [name] (on [place])  "He is using a walker but is having no problem getting around and going to the bathroom. Home health is coming in to help out soon so that will be good. He is eating well, back to solids"   Do you understand why you were in the hospital? yes   Do you understand the discharge instructions? no, Did not get D/C summary upon d/c home. Reviewed this with daughter (regina).   Where were you discharged to? [facility]   Items Reviewed:  Medications reviewed: yes  Allergies reviewed: yes  Dietary changes reviewed: yes  Referrals reviewed: yes   Functional Questionnaire:   Activities of Daily Living (ADLs):   He states they are independent in the following: ambulation, bathing and hygiene, feeding, continence, grooming, toileting, dressing and but still needs a little assistance to do some things.  States they require assistance with the following: n/a   Any transportation issues/concerns?: no   Any patient concerns? No    Confirmed importance and date/time of follow-up visits scheduled yes  Daughter [name] would like to bypass the HFU with our office and have Dr Raliegh Ip refer him to the Nephrologist. No referral was made while patient was admitted.   Confirmed with patient if condition begins to worsen call PCP or go to  the ER.  Patient was given the office number and encouraged to call back with question or concerns.  : yes

## 2017-07-18 NOTE — Telephone Encounter (Signed)
I was very adamant about the HFU appt with our office and Daughter [name] stated that the patient is too weak to go to multiple appts and she feels that at this time seeing Nephrology is most important.

## 2017-07-20 DIAGNOSIS — M6281 Muscle weakness (generalized): Secondary | ICD-10-CM | POA: Diagnosis not present

## 2017-07-20 DIAGNOSIS — R261 Paralytic gait: Secondary | ICD-10-CM | POA: Diagnosis not present

## 2017-07-20 DIAGNOSIS — N183 Chronic kidney disease, stage 3 (moderate): Secondary | ICD-10-CM | POA: Diagnosis not present

## 2017-07-20 DIAGNOSIS — I129 Hypertensive chronic kidney disease with stage 1 through stage 4 chronic kidney disease, or unspecified chronic kidney disease: Secondary | ICD-10-CM | POA: Diagnosis not present

## 2017-07-20 DIAGNOSIS — H5410 Blindness, one eye, low vision other eye, unspecified eyes: Secondary | ICD-10-CM | POA: Diagnosis not present

## 2017-07-20 NOTE — Telephone Encounter (Signed)
Verbal orders given to Will.  

## 2017-07-20 NOTE — Telephone Encounter (Signed)
Verbal orders given to Alexander Mcdonald 

## 2017-07-20 NOTE — Telephone Encounter (Signed)
Order placed for Nephrology Daughter [name] aware that referral has been placed and that a HFU with Dr Raliegh Ip is strongly recommended.  She states that she will discuss following up with Dr Raliegh Ip with the patient again and will let us know if they decide to schedule.

## 2017-07-20 NOTE — Progress Notes (Signed)
Please contact pathology at [facility] - there were biopsies taken also and I need the results - still listed as "active, collected" in lab section - surgical pathology

## 2017-07-23 DIAGNOSIS — H44511 Absolute glaucoma, right eye: Secondary | ICD-10-CM | POA: Diagnosis not present

## 2017-07-23 DIAGNOSIS — H401123 Primary open-angle glaucoma, left eye, severe stage: Secondary | ICD-10-CM | POA: Diagnosis not present

## 2017-07-23 NOTE — Progress Notes (Signed)
My Chart message Biopsies benign

## 2017-07-24 DIAGNOSIS — H5410 Blindness, one eye, low vision other eye, unspecified eyes: Secondary | ICD-10-CM | POA: Diagnosis not present

## 2017-07-24 DIAGNOSIS — R261 Paralytic gait: Secondary | ICD-10-CM | POA: Diagnosis not present

## 2017-07-24 DIAGNOSIS — M6281 Muscle weakness (generalized): Secondary | ICD-10-CM | POA: Diagnosis not present

## 2017-07-24 DIAGNOSIS — N183 Chronic kidney disease, stage 3 (moderate): Secondary | ICD-10-CM | POA: Diagnosis not present

## 2017-07-24 DIAGNOSIS — I129 Hypertensive chronic kidney disease with stage 1 through stage 4 chronic kidney disease, or unspecified chronic kidney disease: Secondary | ICD-10-CM | POA: Diagnosis not present

## 2017-07-26 DIAGNOSIS — M6281 Muscle weakness (generalized): Secondary | ICD-10-CM | POA: Diagnosis not present

## 2017-07-26 DIAGNOSIS — H5410 Blindness, one eye, low vision other eye, unspecified eyes: Secondary | ICD-10-CM | POA: Diagnosis not present

## 2017-07-26 DIAGNOSIS — I129 Hypertensive chronic kidney disease with stage 1 through stage 4 chronic kidney disease, or unspecified chronic kidney disease: Secondary | ICD-10-CM | POA: Diagnosis not present

## 2017-07-26 DIAGNOSIS — R261 Paralytic gait: Secondary | ICD-10-CM | POA: Diagnosis not present

## 2017-07-26 DIAGNOSIS — N183 Chronic kidney disease, stage 3 (moderate): Secondary | ICD-10-CM | POA: Diagnosis not present

## 2017-07-27 DIAGNOSIS — M6281 Muscle weakness (generalized): Secondary | ICD-10-CM | POA: Diagnosis not present

## 2017-07-27 DIAGNOSIS — R261 Paralytic gait: Secondary | ICD-10-CM | POA: Diagnosis not present

## 2017-07-27 DIAGNOSIS — N183 Chronic kidney disease, stage 3 (moderate): Secondary | ICD-10-CM | POA: Diagnosis not present

## 2017-07-27 DIAGNOSIS — H5410 Blindness, one eye, low vision other eye, unspecified eyes: Secondary | ICD-10-CM | POA: Diagnosis not present

## 2017-07-27 DIAGNOSIS — I129 Hypertensive chronic kidney disease with stage 1 through stage 4 chronic kidney disease, or unspecified chronic kidney disease: Secondary | ICD-10-CM | POA: Diagnosis not present

## 2017-07-30 DIAGNOSIS — R261 Paralytic gait: Secondary | ICD-10-CM | POA: Diagnosis not present

## 2017-07-30 DIAGNOSIS — M6281 Muscle weakness (generalized): Secondary | ICD-10-CM | POA: Diagnosis not present

## 2017-07-30 DIAGNOSIS — I129 Hypertensive chronic kidney disease with stage 1 through stage 4 chronic kidney disease, or unspecified chronic kidney disease: Secondary | ICD-10-CM | POA: Diagnosis not present

## 2017-07-30 DIAGNOSIS — N183 Chronic kidney disease, stage 3 (moderate): Secondary | ICD-10-CM | POA: Diagnosis not present

## 2017-07-30 DIAGNOSIS — H5410 Blindness, one eye, low vision other eye, unspecified eyes: Secondary | ICD-10-CM | POA: Diagnosis not present

## 2017-08-01 DIAGNOSIS — N183 Chronic kidney disease, stage 3 (moderate): Secondary | ICD-10-CM | POA: Diagnosis not present

## 2017-08-01 DIAGNOSIS — R261 Paralytic gait: Secondary | ICD-10-CM | POA: Diagnosis not present

## 2017-08-01 DIAGNOSIS — M6281 Muscle weakness (generalized): Secondary | ICD-10-CM | POA: Diagnosis not present

## 2017-08-01 DIAGNOSIS — H5410 Blindness, one eye, low vision other eye, unspecified eyes: Secondary | ICD-10-CM | POA: Diagnosis not present

## 2017-08-01 DIAGNOSIS — I129 Hypertensive chronic kidney disease with stage 1 through stage 4 chronic kidney disease, or unspecified chronic kidney disease: Secondary | ICD-10-CM | POA: Diagnosis not present

## 2017-08-02 DIAGNOSIS — H35361 Drusen (degenerative) of macula, right eye: Secondary | ICD-10-CM | POA: Diagnosis not present

## 2017-08-02 DIAGNOSIS — H353221 Exudative age-related macular degeneration, left eye, with active choroidal neovascularization: Secondary | ICD-10-CM | POA: Diagnosis not present

## 2017-08-03 DIAGNOSIS — H5410 Blindness, one eye, low vision other eye, unspecified eyes: Secondary | ICD-10-CM | POA: Diagnosis not present

## 2017-08-03 DIAGNOSIS — N183 Chronic kidney disease, stage 3 (moderate): Secondary | ICD-10-CM | POA: Diagnosis not present

## 2017-08-03 DIAGNOSIS — M6281 Muscle weakness (generalized): Secondary | ICD-10-CM | POA: Diagnosis not present

## 2017-08-03 DIAGNOSIS — I129 Hypertensive chronic kidney disease with stage 1 through stage 4 chronic kidney disease, or unspecified chronic kidney disease: Secondary | ICD-10-CM | POA: Diagnosis not present

## 2017-08-03 DIAGNOSIS — R261 Paralytic gait: Secondary | ICD-10-CM | POA: Diagnosis not present

## 2017-08-06 DIAGNOSIS — I129 Hypertensive chronic kidney disease with stage 1 through stage 4 chronic kidney disease, or unspecified chronic kidney disease: Secondary | ICD-10-CM | POA: Diagnosis not present

## 2017-08-06 DIAGNOSIS — H5410 Blindness, one eye, low vision other eye, unspecified eyes: Secondary | ICD-10-CM | POA: Diagnosis not present

## 2017-08-06 DIAGNOSIS — R261 Paralytic gait: Secondary | ICD-10-CM | POA: Diagnosis not present

## 2017-08-06 DIAGNOSIS — M6281 Muscle weakness (generalized): Secondary | ICD-10-CM | POA: Diagnosis not present

## 2017-08-06 DIAGNOSIS — N183 Chronic kidney disease, stage 3 (moderate): Secondary | ICD-10-CM | POA: Diagnosis not present

## 2017-08-07 DIAGNOSIS — H5410 Blindness, one eye, low vision other eye, unspecified eyes: Secondary | ICD-10-CM | POA: Diagnosis not present

## 2017-08-07 DIAGNOSIS — R261 Paralytic gait: Secondary | ICD-10-CM | POA: Diagnosis not present

## 2017-08-07 DIAGNOSIS — N183 Chronic kidney disease, stage 3 (moderate): Secondary | ICD-10-CM | POA: Diagnosis not present

## 2017-08-07 DIAGNOSIS — M6281 Muscle weakness (generalized): Secondary | ICD-10-CM | POA: Diagnosis not present

## 2017-08-07 DIAGNOSIS — I129 Hypertensive chronic kidney disease with stage 1 through stage 4 chronic kidney disease, or unspecified chronic kidney disease: Secondary | ICD-10-CM | POA: Diagnosis not present

## 2017-08-08 DIAGNOSIS — H5410 Blindness, one eye, low vision other eye, unspecified eyes: Secondary | ICD-10-CM | POA: Diagnosis not present

## 2017-08-08 DIAGNOSIS — R261 Paralytic gait: Secondary | ICD-10-CM | POA: Diagnosis not present

## 2017-08-08 DIAGNOSIS — N183 Chronic kidney disease, stage 3 (moderate): Secondary | ICD-10-CM | POA: Diagnosis not present

## 2017-08-08 DIAGNOSIS — M6281 Muscle weakness (generalized): Secondary | ICD-10-CM | POA: Diagnosis not present

## 2017-08-08 DIAGNOSIS — I129 Hypertensive chronic kidney disease with stage 1 through stage 4 chronic kidney disease, or unspecified chronic kidney disease: Secondary | ICD-10-CM | POA: Diagnosis not present

## 2017-08-09 DIAGNOSIS — I129 Hypertensive chronic kidney disease with stage 1 through stage 4 chronic kidney disease, or unspecified chronic kidney disease: Secondary | ICD-10-CM | POA: Diagnosis not present

## 2017-08-09 DIAGNOSIS — N183 Chronic kidney disease, stage 3 (moderate): Secondary | ICD-10-CM | POA: Diagnosis not present

## 2017-08-09 DIAGNOSIS — H5410 Blindness, one eye, low vision other eye, unspecified eyes: Secondary | ICD-10-CM | POA: Diagnosis not present

## 2017-08-09 DIAGNOSIS — R261 Paralytic gait: Secondary | ICD-10-CM | POA: Diagnosis not present

## 2017-08-09 DIAGNOSIS — M6281 Muscle weakness (generalized): Secondary | ICD-10-CM | POA: Diagnosis not present

## 2017-08-13 ENCOUNTER — Ambulatory Visit: Payer: Medicare Other | Admitting: Physician Assistant

## 2017-08-16 ENCOUNTER — Other Ambulatory Visit (INDEPENDENT_AMBULATORY_CARE_PROVIDER_SITE_OTHER): Payer: Medicare Other

## 2017-08-16 ENCOUNTER — Encounter: Payer: Self-pay | Admitting: Physician Assistant

## 2017-08-16 ENCOUNTER — Ambulatory Visit (INDEPENDENT_AMBULATORY_CARE_PROVIDER_SITE_OTHER): Payer: Medicare Other | Admitting: Physician Assistant

## 2017-08-16 VITALS — BP 136/58 | HR 64 | Ht 65.5 in | Wt 155.1 lb

## 2017-08-16 DIAGNOSIS — K269 Duodenal ulcer, unspecified as acute or chronic, without hemorrhage or perforation: Secondary | ICD-10-CM

## 2017-08-16 DIAGNOSIS — D62 Acute posthemorrhagic anemia: Secondary | ICD-10-CM

## 2017-08-16 DIAGNOSIS — D649 Anemia, unspecified: Secondary | ICD-10-CM | POA: Diagnosis not present

## 2017-08-16 DIAGNOSIS — K2210 Ulcer of esophagus without bleeding: Secondary | ICD-10-CM

## 2017-08-16 LAB — CBC
HCT: 37.5 % — ABNORMAL LOW (ref 39.0–52.0)
Hemoglobin: 12.7 g/dL — ABNORMAL LOW (ref 13.0–17.0)
MCHC: 33.8 g/dL (ref 30.0–36.0)
MCV: 91 fl (ref 78.0–100.0)
Platelets: 188 10*3/uL (ref 150.0–400.0)
RBC: 4.12 Mil/uL — ABNORMAL LOW (ref 4.22–5.81)
RDW: 15.5 % (ref 11.5–15.5)
WBC: 8 10*3/uL (ref 4.0–10.5)

## 2017-08-16 MED ORDER — PANTOPRAZOLE SODIUM 40 MG PO TBEC: 40.0000 mg | DELAYED_RELEASE_TABLET | Freq: Every day | ORAL | 11 refills | Status: DC

## 2017-08-16 NOTE — Patient Instructions (Addendum)
We have sent the following medications to your pharmacy for you to pick up at your convenience: Pantoprazole 40 mg 30-60 minutes before breakfast  Your provider has requested that you go to the basement level for lab work before leaving today. Press "B" on the elevator. The lab is located at the first door on the left as you exit the elevator.

## 2017-08-16 NOTE — Progress Notes (Signed)
Chief Complaint: Follow-up after hospitalization for melena  HPI:    Mr. Alexander Mcdonald is an 84 year old male with a past medical history as listed below, who is known to Dr. Ardis Hughs and presents clinic today for follow-up after recently being seen in the hospital for melena.    07/09/2017 consult note, patient reported dark stool, had a hemoglobin drop of 13.2-12.0 overnight, hyponatremia and a recent diagnosis of pneumonia.  Had EGD 07/11/2017 with severe erosive esophagitis and nonbleeding duodenal ulcer.  H. pylori serology was negative.  He was told to continue his PPI daily and Carafate 4 times daily.  Last hemoglobin 07/13/2017 was 9.3.    Today, presents to clinic accompanied by his daughter.  History is limited.  Apparently patient has not been taking Pantoprazole or Carafate.  Denies any further abdominal pain, trouble eating or black/dark stools.  Overall he is completely improved after recent hospitalization.  No complaints per him.    Denies fever, chills, weight loss, anorexia, nausea, vomiting or symptoms that awaken him at night.     Past Medical History:  Diagnosis Date  . ACUTE RENAL FAILURE W/LESION OF TUBULAR NECROSIS 01/04/2007  . Bladder spasm 06/04/2014  . BPH (benign prostatic hyperplasia) 06/04/2014  . CAROTID ARTERY DISEASE 06/01/2009  . DEPRESSION 07/25/2006  . DIVERTICULOSIS OF COLON 02/01/2007  . Glaucoma   . GOUT 07/25/2006  . HEMORRHOID, THROMBOSED 02/15/2009  . HYPERCHOLESTEROLEMIA 07/25/2006  . HYPERTENSION 07/25/2006  . PROSTATE CANCER, HX OF 07/25/2006  . Rosacea 09/28/2008  . WEIGHT LOSS 09/01/2009    Past Surgical History:  Procedure Laterality Date  . BIOPSY  07/11/2017   Procedure: BIOPSY;  Surgeon: Gatha Mayer, MD;  Location: [facility];  Service: Endoscopy;;  . BRONCHIAL BRUSHINGS  07/11/2017   Procedure: ESOPHAGEAL BRUSHINGS;  Surgeon: Gatha Mayer, MD;  Location: [facility];  Service: Endoscopy;;  . CAROTID ENDARTERECTOMY    . CATARACT EXTRACTION    .  ESOPHAGOGASTRODUODENOSCOPY (EGD) WITH PROPOFOL N/A 07/11/2017   Procedure: ESOPHAGOGASTRODUODENOSCOPY (EGD) WITH PROPOFOL;  Surgeon: Gatha Mayer, MD;  Location: [facility];  Service: Endoscopy;  Laterality: N/A;    Current Outpatient Medications  Medication Sig Dispense Refill  . acetaminophen (TYLENOL) 325 MG tablet Take 2 tablets (650 mg total) by mouth every 6 (six) hours as needed for mild pain (or Fever >/= 101).    Marland Kitchen amLODipine (NORVASC) 2.5 MG tablet TAKE 1 TABLET BY MOUTH EVERY DAY 90 tablet 0  . atorvastatin (LIPITOR) 10 MG tablet TAKE 1 TABLET(10 MG) BY MOUTH DAILY 90 tablet 0  . atropine 1 % ophthalmic solution Place 1 drop into the right eye at bedtime.   12  . AZOPT 1 % ophthalmic suspension Place 1 drop into both eyes 2 (two) times daily.   11  . feeding supplement, ENSURE ENLIVE, (ENSURE ENLIVE) LIQD Take 237 mLs by mouth 3 (three) times daily between meals. (Patient taking differently: Take 237 mLs by mouth as needed. ) 90 Bottle 12  . fish oil-omega-3 fatty acids 1000 MG capsule Take 1 g by mouth 2 (two) times daily.     . fluticasone (FLONASE) 50 MCG/ACT nasal spray Place 2 sprays into both nostrils daily. 16 g 6  . fosinopril (MONOPRIL) 20 MG tablet TAKE 1 TABLET BY MOUTH EVERY DAY 90 tablet 1  . levocetirizine (XYZAL) 5 MG tablet Take 1 tablet (5 mg total) by mouth every evening. 90 tablet 0  . levothyroxine (SYNTHROID, LEVOTHROID) 50 MCG tablet Take 1 tablet (50 mcg  total) daily by mouth. 90 tablet 3  . Meth-Hyo-M Bl-Na Phos-Ph Sal (URIBEL) 118 MG CAPS Take 1 capsule by mouth 3 (three) times daily as needed (urinary pain).     . Multiple Vitamin (MULTIVITAMIN WITH MINERALS) TABS tablet Take 1 tablet by mouth daily. 30 tablet 0  . tamsulosin (FLOMAX) 0.4 MG CAPS capsule TAKE ONE CAPSULE BY MOUTH EVERY DAY 90 capsule 3  . Travoprost, BAK Free, (TRAVATAN Z) 0.004 % SOLN ophthalmic solution Place 1 drop into the left eye every evening.     No current facility-administered  medications for this visit.     Allergies as of 08/16/2017 - Review Complete 08/16/2017  Allergen Reaction Noted  . Shellfish allergy Anaphylaxis 10/28/2011  . Brimonidine Itching 03/17/2014  . Dorzolamide hcl-timolol mal Other (See Comments) 07/30/2012  . Timolol Itching 03/17/2014    No family history on file.  Social History   Socioeconomic History  . Marital status: Married    Spouse name: Not on file  . Number of children: Not on file  . Years of education: Not on file  . Highest education level: Not on file  Occupational History  . Not on file  Social Needs  . Financial resource strain: Not on file  . Food insecurity:    Worry: Not on file    Inability: Not on file  . Transportation needs:    Medical: Not on file    Non-medical: Not on file  Tobacco Use  . Smoking status: Former Smoker    Last attempt to quit: 01/17/1988    Years since quitting: 29.6  . Smokeless tobacco: Never Used  Substance and Sexual Activity  . Alcohol use: Yes    Alcohol/week: 4.2 oz    Types: 7 drink(s) per week    Comment: beer  . Drug use: No  . Sexual activity: Never  Lifestyle  . Physical activity:    Days per week: Not on file    Minutes per session: Not on file  . Stress: Not on file  Relationships  . Social connections:    Talks on phone: Not on file    Gets together: Not on file    Attends religious service: Not on file    Active member of club or organization: Not on file    Attends meetings of clubs or organizations: Not on file    Relationship status: Not on file  . Intimate partner violence:    Fear of current or ex partner: Not on file    Emotionally abused: Not on file    Physically abused: Not on file    Forced sexual activity: Not on file  Other Topics Concern  . Not on file  Social History Narrative  . Not on file    Review of Systems:    Constitutional: No weight loss, fever or chills Cardiovascular: No chest pain Respiratory: No SOB Gastrointestinal:  See HPI and otherwise negative   Physical Exam:  Vital signs: BP (!) 136/58   Pulse 64   Ht 5' 5.5" (1.664 m)   Wt 155 lb 2 oz (70.4 kg)   BMI 25.42 kg/m   Constitutional:   Elderly Caucasian male appears to be in NAD, Well developed, Well nourished, alert and cooperative Respiratory: Respirations even and unlabored. Lungs clear to auscultation bilaterally.   No wheezes, crackles, or rhonchi.  Cardiovascular: Normal S1, S2. No MRG. Regular rate and rhythm. No peripheral edema, cyanosis or pallor.  Gastrointestinal:  Soft, nondistended, nontender. No rebound  or guarding. Normal bowel sounds. No appreciable masses or hepatomegaly. Msk:  Symmetrical without gross deformities. Without edema, no deformity or joint abnormality. +ambulates with walker Psychiatric: Demonstrates good judgement and reason without abnormal affect or behaviors.  MOST RECENT LABS AND IMAGING: CBC    Component Value Date/Time   WBC 14.8 (H) 07/11/2017 0613   RBC 2.27 (L) 07/11/2017 0613   HGB 9.3 (L) 07/13/2017 0533   HGB 14.7 07/27/2005 1058   HCT 24.5 (L) 07/11/2017 1935   HCT 42.7 07/27/2005 1058   PLT 210 07/11/2017 0613   PLT 183 07/27/2005 1058   MCV 91.2 07/11/2017 0613   MCV 90.9 07/27/2005 1058   MCH 30.8 07/11/2017 0613   MCHC 33.8 07/11/2017 0613   RDW 15.3 07/11/2017 0613   RDW 14.1 07/27/2005 1058   LYMPHSABS 2.5 07/11/2017 0613   LYMPHSABS 1.5 07/27/2005 1058   MONOABS 0.7 07/11/2017 0613   MONOABS 0.6 07/27/2005 1058   EOSABS 0.0 07/11/2017 0613   EOSABS 0.1 07/27/2005 1058   BASOSABS 0.0 07/11/2017 0613   BASOSABS 0.0 07/27/2005 1058    CMP     Component Value Date/Time   NA 142 07/13/2017 0533   K 3.9 07/13/2017 0533   CL 113 (H) 07/13/2017 0533   CO2 24 07/13/2017 0533   GLUCOSE 119 (H) 07/13/2017 0533   BUN 46 (H) 07/13/2017 0533   CREATININE 1.66 (H) 07/13/2017 0533   CREATININE 1.95 (H) 06/04/2013 1725   CALCIUM 8.7 (L) 07/13/2017 0533   PROT 4.4 (L) 07/11/2017 0613    ALBUMIN 2.5 (L) 07/11/2017 0613   AST 24 07/11/2017 0613   ALT 25 07/11/2017 0613   ALKPHOS 34 (L) 07/11/2017 0613   BILITOT 0.5 07/11/2017 0613   GFRNONAA 35 (L) 07/13/2017 0533   GFRNONAA 31 (L) 06/04/2013 1725   GFRAA 41 (L) 07/13/2017 0533   GFRAA 35 (L) 06/04/2013 1725    Assessment: 1.  Erosive esophagitis and duodenal ulcer: Seen at time of recent EGD 07/11/2017, thought to be because of recent acute blood loss anemia while in the hospital for pneumonia 2.  Acute blood loss anemia: Related to above  Plan: 1.  Discussed with patient and his daughter that the patient needs to remain on a PPI daily.  Prescribed Pantoprazole 40 mg daily, 30-60 minutes before breakfast #30 with 11 refills 2.  Ordered repeat CBC 3.  Reviewed recent EGD and findings with the patient and his daughter.  Answered questions. 4.  Patient to follow in clinic with Dr. Ardis Hughs as needed in the future.  Ellouise Newer, PA-C [facility] 08/16/2017, 2:56 PM  Cc: Marletta Lor, MD

## 2017-08-17 NOTE — Progress Notes (Signed)
I agree with the above note, plan 

## 2017-08-22 DIAGNOSIS — M79675 Pain in left toe(s): Secondary | ICD-10-CM | POA: Diagnosis not present

## 2017-08-22 DIAGNOSIS — M79674 Pain in right toe(s): Secondary | ICD-10-CM | POA: Diagnosis not present

## 2017-08-22 DIAGNOSIS — B351 Tinea unguium: Secondary | ICD-10-CM | POA: Diagnosis not present

## 2017-08-24 ENCOUNTER — Other Ambulatory Visit: Payer: Self-pay | Admitting: Internal Medicine

## 2017-08-31 DIAGNOSIS — D649 Anemia, unspecified: Secondary | ICD-10-CM | POA: Diagnosis not present

## 2017-08-31 DIAGNOSIS — R351 Nocturia: Secondary | ICD-10-CM | POA: Diagnosis not present

## 2017-08-31 DIAGNOSIS — N183 Chronic kidney disease, stage 3 (moderate): Secondary | ICD-10-CM | POA: Diagnosis not present

## 2017-08-31 DIAGNOSIS — I129 Hypertensive chronic kidney disease with stage 1 through stage 4 chronic kidney disease, or unspecified chronic kidney disease: Secondary | ICD-10-CM | POA: Diagnosis not present

## 2017-08-31 DIAGNOSIS — D631 Anemia in chronic kidney disease: Secondary | ICD-10-CM | POA: Diagnosis not present

## 2017-09-14 ENCOUNTER — Other Ambulatory Visit: Payer: Self-pay | Admitting: Internal Medicine

## 2017-10-17 ENCOUNTER — Other Ambulatory Visit: Payer: Self-pay | Admitting: Internal Medicine

## 2017-10-17 ENCOUNTER — Other Ambulatory Visit: Payer: Self-pay | Admitting: Family Medicine

## 2017-10-18 DIAGNOSIS — Z961 Presence of intraocular lens: Secondary | ICD-10-CM | POA: Diagnosis not present

## 2017-10-18 DIAGNOSIS — H353221 Exudative age-related macular degeneration, left eye, with active choroidal neovascularization: Secondary | ICD-10-CM | POA: Diagnosis not present

## 2017-10-18 DIAGNOSIS — H401123 Primary open-angle glaucoma, left eye, severe stage: Secondary | ICD-10-CM | POA: Diagnosis not present

## 2017-10-18 DIAGNOSIS — H348112 Central retinal vein occlusion, right eye, stable: Secondary | ICD-10-CM | POA: Diagnosis not present

## 2017-10-18 DIAGNOSIS — H44511 Absolute glaucoma, right eye: Secondary | ICD-10-CM | POA: Diagnosis not present

## 2017-10-18 DIAGNOSIS — H35352 Cystoid macular degeneration, left eye: Secondary | ICD-10-CM | POA: Diagnosis not present

## 2017-10-22 DIAGNOSIS — H401123 Primary open-angle glaucoma, left eye, severe stage: Secondary | ICD-10-CM | POA: Diagnosis not present

## 2017-10-22 DIAGNOSIS — H44511 Absolute glaucoma, right eye: Secondary | ICD-10-CM | POA: Diagnosis not present

## 2017-11-07 DIAGNOSIS — H44511 Absolute glaucoma, right eye: Secondary | ICD-10-CM | POA: Diagnosis not present

## 2017-11-07 DIAGNOSIS — H401123 Primary open-angle glaucoma, left eye, severe stage: Secondary | ICD-10-CM | POA: Diagnosis not present

## 2017-11-23 DIAGNOSIS — N183 Chronic kidney disease, stage 3 (moderate): Secondary | ICD-10-CM | POA: Diagnosis not present

## 2017-11-26 ENCOUNTER — Other Ambulatory Visit: Payer: Self-pay | Admitting: Internal Medicine

## 2017-11-26 DIAGNOSIS — N3000 Acute cystitis without hematuria: Secondary | ICD-10-CM | POA: Diagnosis not present

## 2017-11-26 DIAGNOSIS — C61 Malignant neoplasm of prostate: Secondary | ICD-10-CM | POA: Diagnosis not present

## 2017-11-27 NOTE — Telephone Encounter (Signed)
Pt aware via vm to follow up with new PCP for further refills due to Dr.Kwiakowski's retiring. Left detailed message informing pt of update. Rx sent in for 3 months.

## 2017-12-03 DIAGNOSIS — H44511 Absolute glaucoma, right eye: Secondary | ICD-10-CM | POA: Diagnosis not present

## 2017-12-03 DIAGNOSIS — H401123 Primary open-angle glaucoma, left eye, severe stage: Secondary | ICD-10-CM | POA: Diagnosis not present

## 2017-12-04 ENCOUNTER — Other Ambulatory Visit: Payer: Self-pay

## 2017-12-06 DIAGNOSIS — H401123 Primary open-angle glaucoma, left eye, severe stage: Secondary | ICD-10-CM | POA: Diagnosis not present

## 2017-12-06 DIAGNOSIS — H44511 Absolute glaucoma, right eye: Secondary | ICD-10-CM | POA: Diagnosis not present

## 2017-12-06 DIAGNOSIS — H348112 Central retinal vein occlusion, right eye, stable: Secondary | ICD-10-CM | POA: Diagnosis not present

## 2017-12-06 DIAGNOSIS — H353221 Exudative age-related macular degeneration, left eye, with active choroidal neovascularization: Secondary | ICD-10-CM | POA: Diagnosis not present

## 2017-12-18 DIAGNOSIS — C61 Malignant neoplasm of prostate: Secondary | ICD-10-CM | POA: Diagnosis not present

## 2017-12-19 ENCOUNTER — Other Ambulatory Visit: Payer: Self-pay | Admitting: Internal Medicine

## 2017-12-19 DIAGNOSIS — B351 Tinea unguium: Secondary | ICD-10-CM | POA: Diagnosis not present

## 2017-12-19 DIAGNOSIS — M79675 Pain in left toe(s): Secondary | ICD-10-CM | POA: Diagnosis not present

## 2017-12-19 DIAGNOSIS — M79674 Pain in right toe(s): Secondary | ICD-10-CM | POA: Diagnosis not present

## 2017-12-24 DIAGNOSIS — C61 Malignant neoplasm of prostate: Secondary | ICD-10-CM | POA: Diagnosis not present

## 2017-12-24 DIAGNOSIS — N401 Enlarged prostate with lower urinary tract symptoms: Secondary | ICD-10-CM | POA: Diagnosis not present

## 2017-12-24 DIAGNOSIS — R351 Nocturia: Secondary | ICD-10-CM | POA: Diagnosis not present

## 2018-01-22 ENCOUNTER — Ambulatory Visit (INDEPENDENT_AMBULATORY_CARE_PROVIDER_SITE_OTHER): Payer: Medicare Other | Admitting: Internal Medicine

## 2018-01-22 ENCOUNTER — Encounter: Payer: Self-pay | Admitting: Internal Medicine

## 2018-01-22 VITALS — BP 120/78 | HR 62 | Temp 97.6°F | Wt 161.2 lb

## 2018-01-22 DIAGNOSIS — Z23 Encounter for immunization: Secondary | ICD-10-CM | POA: Diagnosis not present

## 2018-01-22 DIAGNOSIS — I10 Essential (primary) hypertension: Secondary | ICD-10-CM

## 2018-01-22 DIAGNOSIS — E7800 Pure hypercholesterolemia, unspecified: Secondary | ICD-10-CM

## 2018-01-22 DIAGNOSIS — N183 Chronic kidney disease, stage 3 unspecified: Secondary | ICD-10-CM

## 2018-01-22 DIAGNOSIS — E039 Hypothyroidism, unspecified: Secondary | ICD-10-CM | POA: Diagnosis not present

## 2018-01-22 MED ORDER — ASPIRIN EC 81 MG PO TBEC: 81.0000 mg | DELAYED_RELEASE_TABLET | Freq: Every day | ORAL | Status: AC

## 2018-01-22 NOTE — Patient Instructions (Addendum)
-  It was nice meeting you today!  -Flu shot today.  -Start taking aspirin 81 mg daily. Be vigilant for stomach upset, reflux or blood in your stools. If you have any of these discontinue use of aspirin.  -Return in 4-6 months for your annual physical. Please come in fasting at that time.

## 2018-01-22 NOTE — Progress Notes (Signed)
Established Patient Office Visit     CC/Reason for Visit: Establish care, follow-up chronic medical conditions.  HPI: Alexander Mcdonald is a 85 y.o. male who is coming in today for the above mentioned reasons.  He is past due for his annual physical exam.  Past Medical History is significant for: Hypertension that has been well controlled on amlodipine and lisinopril, stage III chronic kidney disease with a baseline creatinine of around 2, hypothyroidism that has been stable on Synthroid 50 mcg, also history of glaucoma and macular degeneration who follows routinely with his ophthalmologist.  Last summer he had a bout of pneumonia that caused a lot of vomiting, he subsequently had a mild GI bleed and was diagnosed with reflux esophagitis.  Aspirin was stopped at that time.  He has no acute complaints at today's visit.   Past Medical/Surgical History: Past Medical History:  Diagnosis Date  . ACUTE RENAL FAILURE W/LESION OF TUBULAR NECROSIS 01/04/2007  . Bladder spasm 06/04/2014  . BPH (benign prostatic hyperplasia) 06/04/2014  . CAROTID ARTERY DISEASE 06/01/2009  . DEPRESSION 07/25/2006  . DIVERTICULOSIS OF COLON 02/01/2007  . Glaucoma   . GOUT 07/25/2006  . HEMORRHOID, THROMBOSED 02/15/2009  . HYPERCHOLESTEROLEMIA 07/25/2006  . HYPERTENSION 07/25/2006  . PROSTATE CANCER, HX OF 07/25/2006  . Rosacea 09/28/2008  . WEIGHT LOSS 09/01/2009    Past Surgical History:  Procedure Laterality Date  . BIOPSY  07/11/2017   Procedure: BIOPSY;  Surgeon: Gatha Mayer, MD;  Location: [facility];  Service: Endoscopy;;  . BRONCHIAL BRUSHINGS  07/11/2017   Procedure: ESOPHAGEAL BRUSHINGS;  Surgeon: Gatha Mayer, MD;  Location: [facility];  Service: Endoscopy;;  . CAROTID ENDARTERECTOMY    . CATARACT EXTRACTION    . ESOPHAGOGASTRODUODENOSCOPY (EGD) WITH PROPOFOL N/A 07/11/2017   Procedure: ESOPHAGOGASTRODUODENOSCOPY (EGD) WITH PROPOFOL;  Surgeon: Gatha Mayer, MD;  Location: [facility];  Service:  Endoscopy;  Laterality: N/A;    Social History:  reports that he quit smoking about 30 years ago. He has never used smokeless tobacco. He reports current alcohol use of about 7.0 standard drinks of alcohol per week. He reports that he does not use drugs.  Allergies: Allergies  Allergen Reactions  . Shellfish Allergy Anaphylaxis    Pt is allergic to mussels   . Brimonidine Itching    Itching and swelling red  . Dorzolamide Hcl-Timolol Mal Other (See Comments)    Eye itch and redness  . Timolol Itching    Itching rash under eyes.    Family History:  Per patient no history of heart disease or stroke in immediate family members.  Current Outpatient Medications:  .  acetaminophen (TYLENOL) 325 MG tablet, Take 2 tablets (650 mg total) by mouth every 6 (six) hours as needed for mild pain (or Fever >/= 101)., Disp: , Rfl:  .  amLODipine (NORVASC) 2.5 MG tablet, TAKE 1 TABLET BY MOUTH EVERY DAY, Disp: 90 tablet, Rfl: 0 .  apraclonidine (IOPIDINE) 0.5 % ophthalmic solution, Place 1 drop into the left eye 2 times daily., Disp: , Rfl:  .  atorvastatin (LIPITOR) 10 MG tablet, TAKE 1 TABLET(10 MG) BY MOUTH DAILY, Disp: 90 tablet, Rfl: 0 .  fish oil-omega-3 fatty acids 1000 MG capsule, Take 1 g by mouth 2 (two) times daily. , Disp: , Rfl:  .  fluticasone (FLONASE) 50 MCG/ACT nasal spray, SHAKE LIQUID AND USE 2 SPRAYS IN EACH NOSTRIL DAILY, Disp: 16 g, Rfl: 3 .  fosinopril (MONOPRIL) 20 MG tablet,  TAKE 1 TABLET BY MOUTH EVERY DAY, Disp: 90 tablet, Rfl: 1 .  levocetirizine (XYZAL) 5 MG tablet, Take 1 tablet (5 mg total) by mouth every evening., Disp: 90 tablet, Rfl: 0 .  levothyroxine (SYNTHROID, LEVOTHROID) 50 MCG tablet, TAKE 1 TABLET(50 MCG) BY MOUTH DAILY, Disp: 90 tablet, Rfl: 0 .  Meth-Hyo-M Bl-Na Phos-Ph Sal (URIBEL) 118 MG CAPS, Take 1 capsule by mouth 3 (three) times daily as needed (urinary pain). , Disp: , Rfl:  .  Multiple Vitamin (MULTIVITAMIN WITH MINERALS) TABS tablet, Take 1 tablet by  mouth daily., Disp: 30 tablet, Rfl: 0 .  Travoprost, BAK Free, (TRAVATAN Z) 0.004 % SOLN ophthalmic solution, Place 1 drop into the left eye every evening., Disp: , Rfl:  .  aspirin EC 81 MG tablet, Take 1 tablet (81 mg total) by mouth daily., Disp: , Rfl:   Review of Systems:  Constitutional: Denies fever, chills, diaphoresis, appetite change and fatigue.  HEENT: Denies photophobia, eye pain, redness, hearing loss, ear pain, congestion, sore throat, rhinorrhea, sneezing, mouth sores, trouble swallowing, neck pain, neck stiffness and tinnitus.   Respiratory: Denies SOB, DOE, cough, chest tightness,  and wheezing.   Cardiovascular: Denies chest pain, palpitations and leg swelling.  Gastrointestinal: Denies nausea, vomiting, abdominal pain, diarrhea, constipation, blood in stool and abdominal distention.  Genitourinary: Denies dysuria, urgency, frequency, hematuria, flank pain and difficulty urinating.  Endocrine: Denies: hot or cold intolerance, sweats, changes in hair or nails, polyuria, polydipsia. Musculoskeletal: Denies myalgias, back pain, joint swelling, arthralgias and gait problem.  Skin: Denies pallor, rash and wound.  Neurological: Denies dizziness, seizures, syncope, weakness, light-headedness, numbness and headaches.  Hematological: Denies adenopathy. Easy bruising, personal or family bleeding history  Psychiatric/Behavioral: Denies suicidal ideation, mood changes, confusion, nervousness, sleep disturbance and agitation    Physical Exam: Vitals:   01/22/18 1502  BP: 120/78  Pulse: 62  Temp: 97.6 F (36.4 C)  TempSrc: Oral  SpO2: 94%  Weight: 161 lb 3.2 oz (73.1 kg)    Body mass index is 26.42 kg/m.   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 Neck: normal, supple, no masses, no thyromegaly Respiratory: clear to auscultation bilaterally, no wheezing, no crackles. Normal respiratory effort. No accessory muscle  use.  Cardiovascular: Regular rate and rhythm, no murmurs / rubs / gallops. No extremity edema. 2+ pedal pulses. No carotid bruits.  Abdomen: no tenderness, no masses palpated. No hepatosplenomegaly. Bowel sounds positive.  Musculoskeletal: no clubbing / cyanosis. No joint deformity upper and lower extremities. Good ROM, no contractures. Normal muscle tone.  Skin: no rashes, lesions, ulcers. No induration Neurologic: CN 2-12 grossly intact. Sensation intact, DTR normal. Strength 5/5 in all 4.  Psychiatric: Normal judgment and insight. Alert and oriented x 3. Normal mood.    Impression and Plan:  HYPERCHOLESTEROLEMIA  -Last LDL was 100 in November 2018. -Is on atorvastatin 10 mg daily. -We will recheck cholesterol levels at time of annual wellness visit.  Essential hypertension -Well-controlled on current doses of Norvasc and fosinopril. -He has been off aspirin for 8 months due to what was presumed to be erosive esophagitis. -They would like to restart aspirin.  I will go ahead and reorder aspirin 81 mg daily.  They are advised to be vigilant for stomach upset, reflux or blood in stools in which case he will discontinue use and notify us.  Stage 3 chronic kidney disease (HCC) -Stable, baseline creatinine remains around 2.  Hypothyroidism, unspecified type -Last TSH  was 2.35 in June 2019. -Continue Synthroid.    Patient Instructions  -It was nice meeting you today!  -Flu shot today.  -Start taking aspirin 81 mg daily. Be vigilant for stomach upset, reflux or blood in your stools. If you have any of these discontinue use of aspirin.  -Return in 4-6 months for your annual physical. Please come in fasting at that time.     Lelon Frohlich, MD [facility]

## 2018-01-22 NOTE — Addendum Note (Signed)
Addended by: Westley Hummer B on: 01/22/2018 05:31 PM   Modules accepted: Orders

## 2018-01-24 DIAGNOSIS — H353221 Exudative age-related macular degeneration, left eye, with active choroidal neovascularization: Secondary | ICD-10-CM | POA: Diagnosis not present

## 2018-03-04 ENCOUNTER — Other Ambulatory Visit: Payer: Self-pay | Admitting: Internal Medicine

## 2018-03-04 DIAGNOSIS — H401123 Primary open-angle glaucoma, left eye, severe stage: Secondary | ICD-10-CM | POA: Diagnosis not present

## 2018-03-04 DIAGNOSIS — H44511 Absolute glaucoma, right eye: Secondary | ICD-10-CM | POA: Diagnosis not present

## 2018-03-04 NOTE — Telephone Encounter (Signed)
Requested medication (s) are due for refill today: Yes  Requested medication (s) are on the active medication list: Yes  Last refill:  11/27/17  Future visit scheduled: No  Notes to clinic:  Unable to refill, last refill by Dr. Raliegh Ip.     Requested Prescriptions  Pending Prescriptions Disp Refills   levothyroxine (SYNTHROID, LEVOTHROID) 50 MCG tablet 90 tablet 0    Sig: TAKE 1 TABLET(50 MCG) BY MOUTH DAILY     Endocrinology:  Hypothyroid Agents Failed - 03/04/2018  3:57 PM      Failed - TSH needs to be rechecked within 3 months after an abnormal result. Refill until TSH is due.      Passed - TSH in normal range and within 360 days    TSH  Date Value Ref Range Status  07/05/2017 2.351 0.350 - 4.500 uIU/mL Final    Comment:    Performed by a 3rd Generation assay with a functional sensitivity of <=0.01 uIU/mL. Performed at [facility], [place] [address] [place], [place] [address]   06/06/2017 3.22 0.35 - 4.50 uIU/mL Final         Passed - Valid encounter within last 12 months    Recent Outpatient Visits          1 month ago [place] at [facility], Rayford Halsted, MD   8 months ago Essential hypertension   [facility], Doretha Sou, MD   8 months ago URI with cough and congestion   [place] at [facility], Langley Adie, MD   9 months ago Essential hypertension   [facility]rap[facility], [facility] at [facility], Doretha Sou, MD   1 year ago Need for influenza vaccination   [place] at [facility], Doretha Sou, MD

## 2018-03-04 NOTE — Telephone Encounter (Signed)
Copied from [place] [id number]. Topic: Quick Communication - Rx Refill/Question >> Mar 04, 2018  3:52 PM Sheran Luz wrote: Medication: levothyroxine (SYNTHROID, LEVOTHROID) 50 MCG tablet   Patient is requesting a refill of this medication   Preferred Pharmacy (with phone number or street name):[facility] [place], [place] [address] AT [place] [phone] (Phone) [phone] (Fax)

## 2018-03-06 MED ORDER — LEVOTHYROXINE SODIUM 50 MCG PO TABS: ORAL_TABLET | ORAL | 1 refills | Status: DC

## 2018-03-06 NOTE — Telephone Encounter (Signed)
Patient is checking on his levothyroxine (SYNTHROID, LEVOTHROID) 50 MCG tablet medication refill.  He only have one tablet left and he will take it today.

## 2018-03-06 NOTE — Telephone Encounter (Signed)
Requested medication (s) are due for refill today -yes  Requested medication (s) are on the active medication list -yes  Future visit scheduled - no  Last refill: 11/27/17  Notes to clinic: Patient is requesting a refill of medication that was prescribed by provider no longer with practice. Medication passes protocol.  Requested Prescriptions  Pending Prescriptions Disp Refills   levothyroxine (SYNTHROID, LEVOTHROID) 50 MCG tablet 90 tablet 0    Sig: TAKE 1 TABLET(50 MCG) BY MOUTH DAILY     Endocrinology:  Hypothyroid Agents Failed - 03/06/2018 11:32 AM      Failed - TSH needs to be rechecked within 3 months after an abnormal result. Refill until TSH is due.      Passed - TSH in normal range and within 360 days    TSH  Date Value Ref Range Status  07/05/2017 2.351 0.350 - 4.500 uIU/mL Final    Comment:    Performed by a 3rd Generation assay with a functional sensitivity of <=0.01 uIU/mL. Performed at [facility], [place] [address] [place], [place] [address]   06/06/2017 3.22 0.35 - 4.50 uIU/mL Final         Passed - Valid encounter within last 12 months    Recent Outpatient Visits          1 month ago [place] at [facility], Rayford Halsted, MD   8 months ago Essential hypertension   Therapist, [facility] at [facility], Doretha Sou, MD   8 months ago URI with cough and congestion   [place] at [place], MD   9 months ago Essential hypertension   [facility] at [facility], Doretha Sou, MD   1 year ago Need for influenza vaccination   [place] [facility], Doretha Sou, MD              Requested Prescriptions  Pending Prescriptions Disp Refills   levothyroxine (SYNTHROID, LEVOTHROID) 50 MCG tablet 90 tablet 0    Sig: TAKE 1 TABLET(50 MCG) BY MOUTH DAILY     Endocrinology:  Hypothyroid Agents Failed - 03/06/2018 11:32 AM      Failed -  TSH needs to be rechecked within 3 months after an abnormal result. Refill until TSH is due.      Passed - TSH in normal range and within 360 days    TSH  Date Value Ref Range Status  07/05/2017 2.351 0.350 - 4.500 uIU/mL Final    Comment:    Performed by a 3rd Generation assay with a functional sensitivity of <=0.01 uIU/mL. Performed at [facility], [place] [address], [place], [place] [address]   06/06/2017 3.22 0.35 - 4.50 uIU/mL Final         Passed - Valid encounter within last 12 months    Recent Outpatient Visits          1 month ago [place] at [facility], Rayford Halsted, MD   8 months ago Essential hypertension   [facility], Doretha Sou, MD   8 months ago URI with cough and congestion   [place] at [facility], Langley Adie, MD   9 months ago Essential hypertension   Therapist, [facility] at [facility], Doretha Sou, MD   1 year ago Need for influenza vaccination   [place] at [facility], Doretha Sou, MD

## 2018-03-14 DIAGNOSIS — D649 Anemia, unspecified: Secondary | ICD-10-CM | POA: Diagnosis not present

## 2018-03-14 DIAGNOSIS — N183 Chronic kidney disease, stage 3 (moderate): Secondary | ICD-10-CM | POA: Diagnosis not present

## 2018-03-14 DIAGNOSIS — D631 Anemia in chronic kidney disease: Secondary | ICD-10-CM | POA: Diagnosis not present

## 2018-03-14 DIAGNOSIS — I129 Hypertensive chronic kidney disease with stage 1 through stage 4 chronic kidney disease, or unspecified chronic kidney disease: Secondary | ICD-10-CM | POA: Diagnosis not present

## 2018-03-14 DIAGNOSIS — R351 Nocturia: Secondary | ICD-10-CM | POA: Diagnosis not present

## 2018-03-22 DIAGNOSIS — B351 Tinea unguium: Secondary | ICD-10-CM | POA: Diagnosis not present

## 2018-03-22 DIAGNOSIS — L600 Ingrowing nail: Secondary | ICD-10-CM | POA: Diagnosis not present

## 2018-03-25 ENCOUNTER — Other Ambulatory Visit: Payer: Self-pay | Admitting: Internal Medicine

## 2018-03-25 MED ORDER — ATORVASTATIN CALCIUM 10 MG PO TABS: ORAL_TABLET | ORAL | 0 refills | Status: DC

## 2018-03-25 NOTE — Telephone Encounter (Signed)
Copied from [place] [phone] [phone]. Topic: Quick Communication - Rx Refill/Question >> Mar 25, 2018  1:50 PM Bea Graff, NT wrote: Medication: atorvastatin (LIPITOR) 10 MG tablet   Has the patient contacted their pharmacy? Yes.   (Agent: If no, request that the patient contact the pharmacy for the refill.) (Agent: If yes, when and what did the pharmacy advise?)  Preferred Pharmacy (with phone number or street name): [facility] [place], [address] AT [place] [phone] (Phone) [phone] (Fax)    Agent: Please be advised that RX refills may take up to 3 business days. We ask that you follow-up with your pharmacy.

## 2018-03-28 DIAGNOSIS — H353221 Exudative age-related macular degeneration, left eye, with active choroidal neovascularization: Secondary | ICD-10-CM | POA: Diagnosis not present

## 2018-04-10 DIAGNOSIS — H44511 Absolute glaucoma, right eye: Secondary | ICD-10-CM | POA: Diagnosis not present

## 2018-04-10 DIAGNOSIS — H401123 Primary open-angle glaucoma, left eye, severe stage: Secondary | ICD-10-CM | POA: Diagnosis not present

## 2018-05-23 DIAGNOSIS — H353221 Exudative age-related macular degeneration, left eye, with active choroidal neovascularization: Secondary | ICD-10-CM | POA: Diagnosis not present

## 2018-06-07 ENCOUNTER — Other Ambulatory Visit: Payer: Self-pay | Admitting: Internal Medicine

## 2018-06-11 ENCOUNTER — Other Ambulatory Visit: Payer: Self-pay | Admitting: Internal Medicine

## 2018-06-12 DIAGNOSIS — H401123 Primary open-angle glaucoma, left eye, severe stage: Secondary | ICD-10-CM | POA: Diagnosis not present

## 2018-06-12 DIAGNOSIS — H44511 Absolute glaucoma, right eye: Secondary | ICD-10-CM | POA: Diagnosis not present

## 2018-07-18 DIAGNOSIS — H353221 Exudative age-related macular degeneration, left eye, with active choroidal neovascularization: Secondary | ICD-10-CM | POA: Diagnosis not present

## 2018-07-18 DIAGNOSIS — H348112 Central retinal vein occlusion, right eye, stable: Secondary | ICD-10-CM | POA: Diagnosis not present

## 2018-08-19 DIAGNOSIS — H44511 Absolute glaucoma, right eye: Secondary | ICD-10-CM | POA: Diagnosis not present

## 2018-08-19 DIAGNOSIS — H401123 Primary open-angle glaucoma, left eye, severe stage: Secondary | ICD-10-CM | POA: Diagnosis not present

## 2018-09-05 ENCOUNTER — Other Ambulatory Visit: Payer: Self-pay | Admitting: Internal Medicine

## 2018-09-05 DIAGNOSIS — H353221 Exudative age-related macular degeneration, left eye, with active choroidal neovascularization: Secondary | ICD-10-CM | POA: Diagnosis not present

## 2018-09-07 ENCOUNTER — Other Ambulatory Visit: Payer: Self-pay | Admitting: Internal Medicine

## 2018-09-25 ENCOUNTER — Encounter: Payer: Medicare Other | Admitting: Internal Medicine

## 2018-10-14 ENCOUNTER — Ambulatory Visit (INDEPENDENT_AMBULATORY_CARE_PROVIDER_SITE_OTHER): Payer: Medicare Other | Admitting: Podiatry

## 2018-10-14 ENCOUNTER — Other Ambulatory Visit: Payer: Self-pay

## 2018-10-14 ENCOUNTER — Encounter: Payer: Self-pay | Admitting: Podiatry

## 2018-10-14 VITALS — BP 137/88 | HR 82

## 2018-10-14 DIAGNOSIS — M79674 Pain in right toe(s): Secondary | ICD-10-CM | POA: Diagnosis not present

## 2018-10-14 DIAGNOSIS — M79675 Pain in left toe(s): Secondary | ICD-10-CM | POA: Diagnosis not present

## 2018-10-14 DIAGNOSIS — B351 Tinea unguium: Secondary | ICD-10-CM

## 2018-10-16 NOTE — Progress Notes (Signed)
Subjective:   Patient ID: Alexander Mcdonald, male   DOB: 85 y.o.   MRN: [id number]   HPI 85 year old male presents the office for concerns of thick, painful, elongated toenails that he cannot trim himself.  I saw him for similar issue in 2017.  He has been getting pedicures but due to the recent pandemic is not able to do so.  Denies any drainage or pus from the toenail sites but the nails are painful.   Review of Systems  All other systems reviewed and are negative.  Past Medical History:  Diagnosis Date  . ACUTE RENAL FAILURE W/LESION OF TUBULAR NECROSIS 01/04/2007  . Bladder spasm 06/04/2014  . BPH (benign prostatic hyperplasia) 06/04/2014  . CAROTID ARTERY DISEASE 06/01/2009  . DEPRESSION 07/25/2006  . DIVERTICULOSIS OF COLON 02/01/2007  . Glaucoma   . GOUT 07/25/2006  . HEMORRHOID, THROMBOSED 02/15/2009  . HYPERCHOLESTEROLEMIA 07/25/2006  . HYPERTENSION 07/25/2006  . PROSTATE CANCER, HX OF 07/25/2006  . Rosacea 09/28/2008  . WEIGHT LOSS 09/01/2009    Past Surgical History:  Procedure Laterality Date  . BIOPSY  07/11/2017   Procedure: BIOPSY;  Surgeon: Gatha Mayer, MD;  Location: [facility];  Service: Endoscopy;;  . BRONCHIAL BRUSHINGS  07/11/2017   Procedure: ESOPHAGEAL BRUSHINGS;  Surgeon: Gatha Mayer, MD;  Location: [facility];  Service: Endoscopy;;  . CAROTID ENDARTERECTOMY    . CATARACT EXTRACTION    . ESOPHAGOGASTRODUODENOSCOPY (EGD) WITH PROPOFOL N/A 07/11/2017   Procedure: ESOPHAGOGASTRODUODENOSCOPY (EGD) WITH PROPOFOL;  Surgeon: Gatha Mayer, MD;  Location: [facility];  Service: Endoscopy;  Laterality: N/A;     Current Outpatient Medications:  .  acetaminophen (TYLENOL) 325 MG tablet, Take 2 tablets (650 mg total) by mouth every 6 (six) hours as needed for mild pain (or Fever >/= 101)., Disp: , Rfl:  .  amLODipine (NORVASC) 2.5 MG tablet, TAKE 1 TABLET BY MOUTH EVERY DAY, Disp: 90 tablet, Rfl: 0 .  apraclonidine (IOPIDINE) 0.5 % ophthalmic solution, Place 1 drop  into the left eye 2 times daily., Disp: , Rfl:  .  aspirin EC 81 MG tablet, Take 1 tablet (81 mg total) by mouth daily., Disp: , Rfl:  .  atorvastatin (LIPITOR) 10 MG tablet, TAKE 1 TABLET(10 MG) BY MOUTH DAILY, Disp: 90 tablet, Rfl: 1 .  fish oil-omega-3 fatty acids 1000 MG capsule, Take 1 g by mouth 2 (two) times daily. , Disp: , Rfl:  .  fluticasone (FLONASE) 50 MCG/ACT nasal spray, SHAKE LIQUID AND USE 2 SPRAYS IN EACH NOSTRIL DAILY, Disp: 16 g, Rfl: 3 .  fosinopril (MONOPRIL) 20 MG tablet, TAKE 1 TABLET BY MOUTH EVERY DAY, Disp: 90 tablet, Rfl: 1 .  levocetirizine (XYZAL) 5 MG tablet, Take 1 tablet (5 mg total) by mouth every evening., Disp: 90 tablet, Rfl: 0 .  levothyroxine (SYNTHROID) 50 MCG tablet, TAKE 1 TABLET BY MOUTH DAILY, Disp: 90 tablet, Rfl: 1 .  Meth-Hyo-M Bl-Na Phos-Ph Sal (URIBEL) 118 MG CAPS, Take 1 capsule by mouth 3 (three) times daily as needed (urinary pain). , Disp: , Rfl:  .  Multiple Vitamin (MULTIVITAMIN WITH MINERALS) TABS tablet, Take 1 tablet by mouth daily., Disp: 30 tablet, Rfl: 0 .  Travoprost, BAK Free, (TRAVATAN Z) 0.004 % SOLN ophthalmic solution, Place 1 drop into the left eye every evening., Disp: , Rfl:   Allergies  Allergen Reactions  . Shellfish Allergy Anaphylaxis    Pt is allergic to mussels   . Brimonidine Itching    Itching  and swelling red  . Dorzolamide Hcl-Timolol Mal Other (See Comments)    Eye itch and redness  . Timolol Itching    Itching rash under eyes.         Objective:  Physical Exam  General: NAD  Dermatological: Nails are hypertrophic, dystrophic, brittle, discolored, elongated 10. No surrounding redness or drainage. Tenderness nails 1-5 bilaterally. No open lesions or pre-ulcerative lesions are identified today.  Vascular: Dorsalis Pedis artery and Posterior Tibial artery pedal pulses are palpable bilateral with immedate capillary fill time. There is no pain with calf compression, swelling, warmth, erythema.    Neruologic: Grossly intact via light touch bilateral.   Musculoskeletal: No gross boney pedal deformities bilateral. No pain, crepitus, or limitation noted with foot and ankle range of motion bilateral.    Assessment:   Symptomatic onychomycosis    Plan:  -Treatment options discussed including all alternatives, risks, and complications -Etiology of symptoms were discussed -Nails debrided 10 without complications or bleeding. -Daily foot inspection -Follow-up in 3 months or sooner if any problems arise. In the meantime, encouraged to call the office with any questions, concerns, change in symptoms.   Celesta Gentile, DPM

## 2018-10-24 ENCOUNTER — Encounter: Payer: Medicare Other | Admitting: Internal Medicine

## 2018-10-31 DIAGNOSIS — H353221 Exudative age-related macular degeneration, left eye, with active choroidal neovascularization: Secondary | ICD-10-CM | POA: Diagnosis not present

## 2018-11-17 IMAGING — DX DG CHEST 2V
2 series · 2 of 2 positions shown · non-contrast
Comparison: December 26, 2011

CLINICAL DATA: Productive cough and wheezing.

EXAM:
CHEST - 2 VIEW

[chest pa]
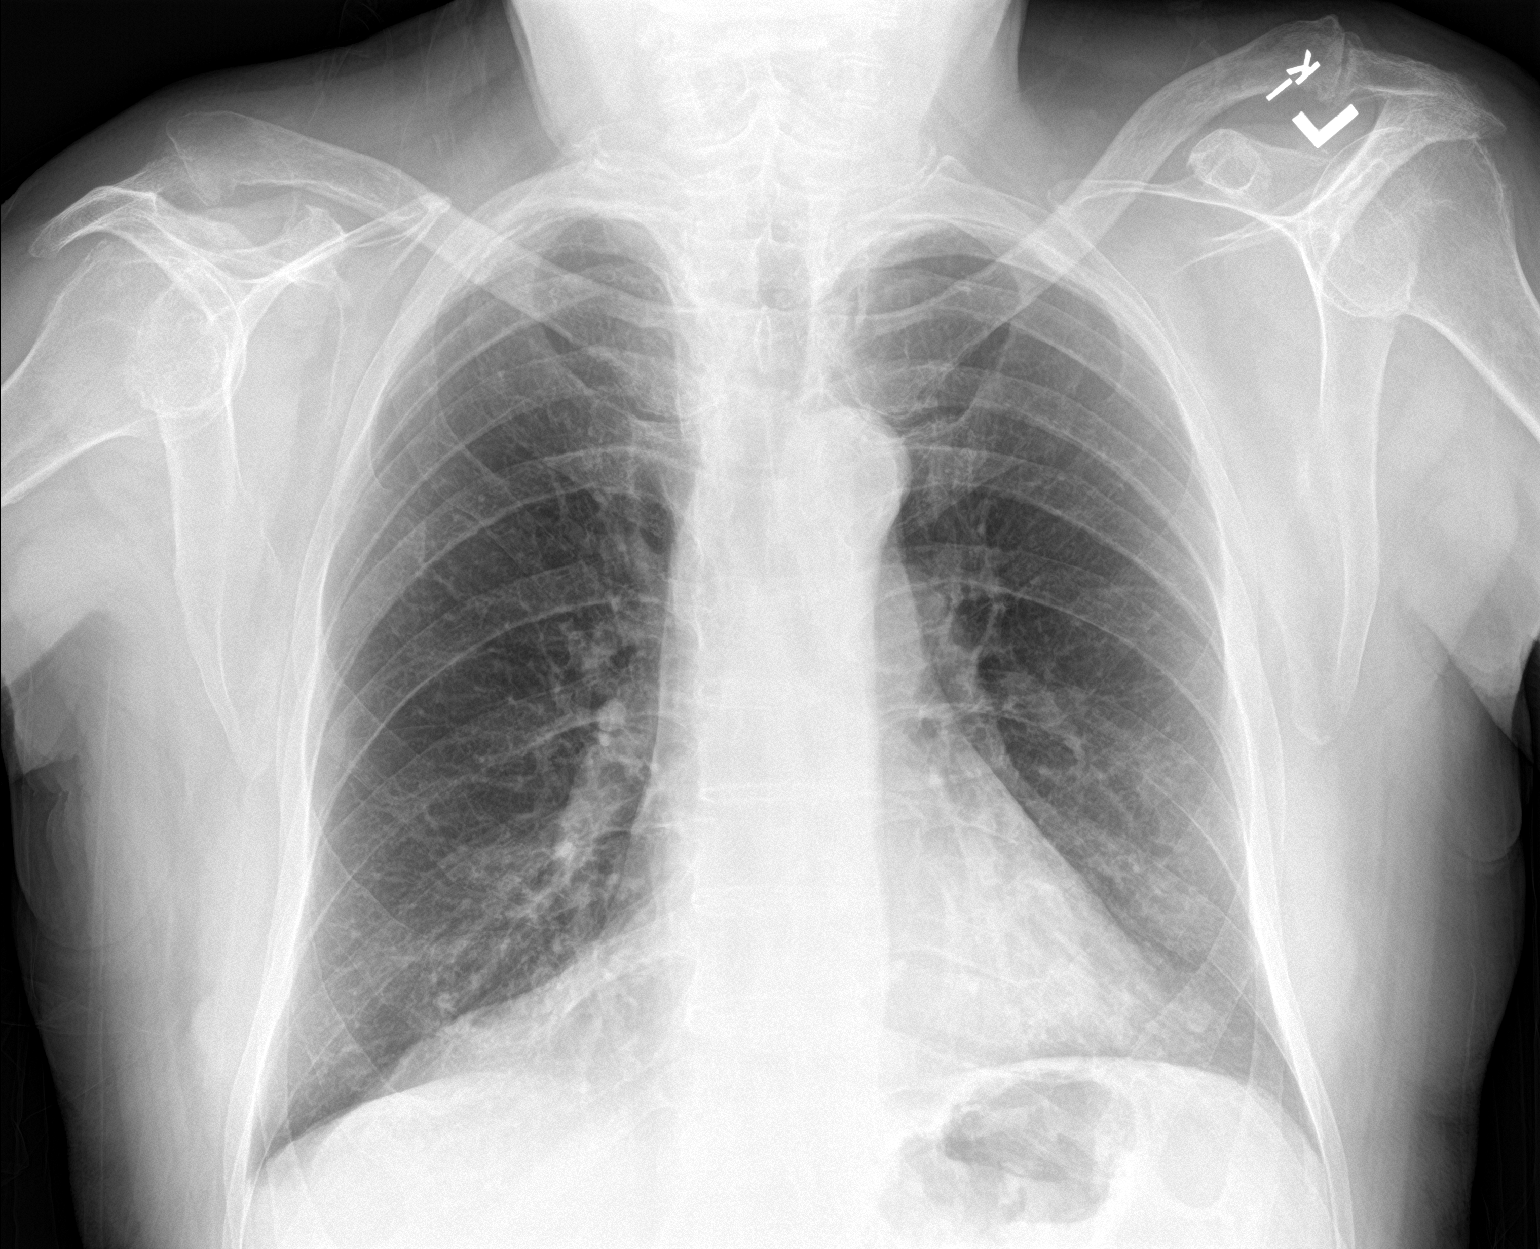

[chest lat]
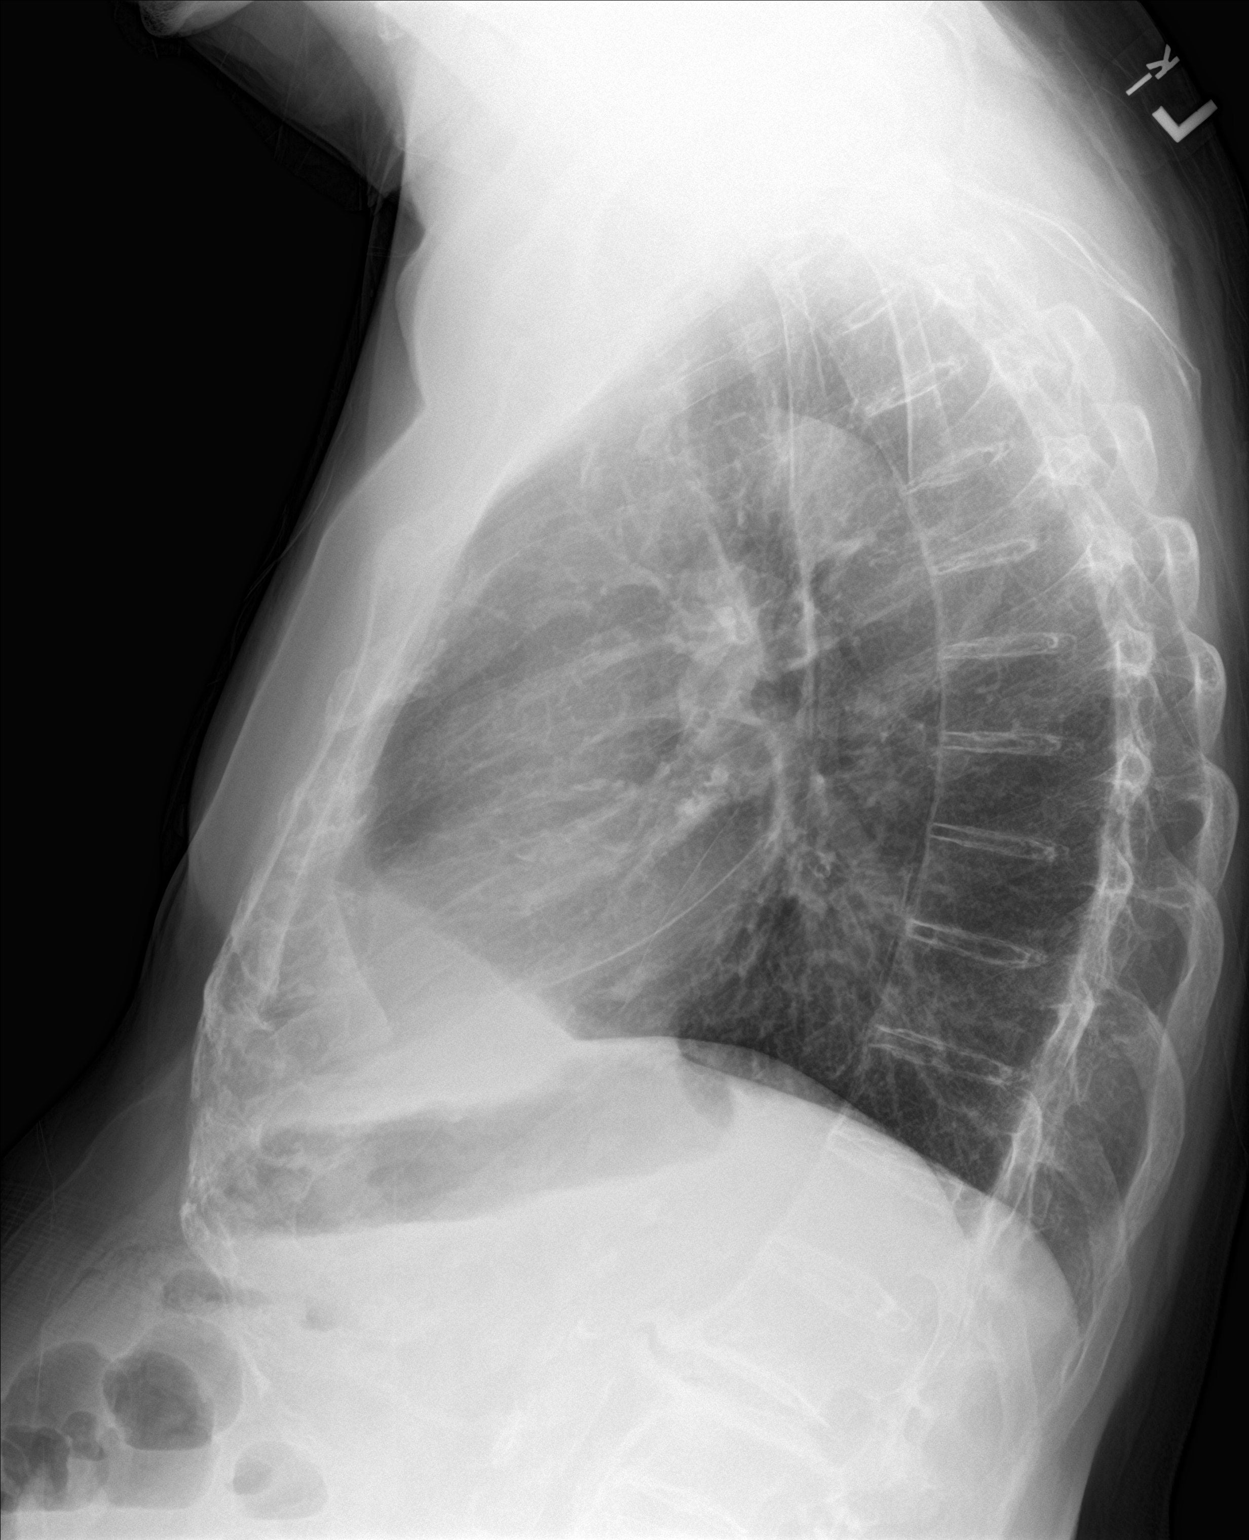

[2 of 2 positions shown; findings below may reference images not displayed]

FINDINGS: Minimal haziness in the left base could represent subtle infiltrate
given history. No other interval changes since February 08, 2007.
Probable ankylosing spondylitis in the thoracic spine.
IMPRESSION: 1. Possible subtle infiltrate in the left base. Recommend follow-up
to resolution.
2. Probable ankylosing spondylitis with flowing osteophytes on the
lateral view. This finding is stable.

## 2018-11-21 DIAGNOSIS — Z23 Encounter for immunization: Secondary | ICD-10-CM | POA: Diagnosis not present

## 2018-11-23 IMAGING — CR DG CHEST 2V
2 series · 2 of 2 positions shown · non-contrast
Comparison: 06/29/2017

CLINICAL DATA: Shortness of breath

EXAM:
CHEST - 2 VIEW

[w chest lat]
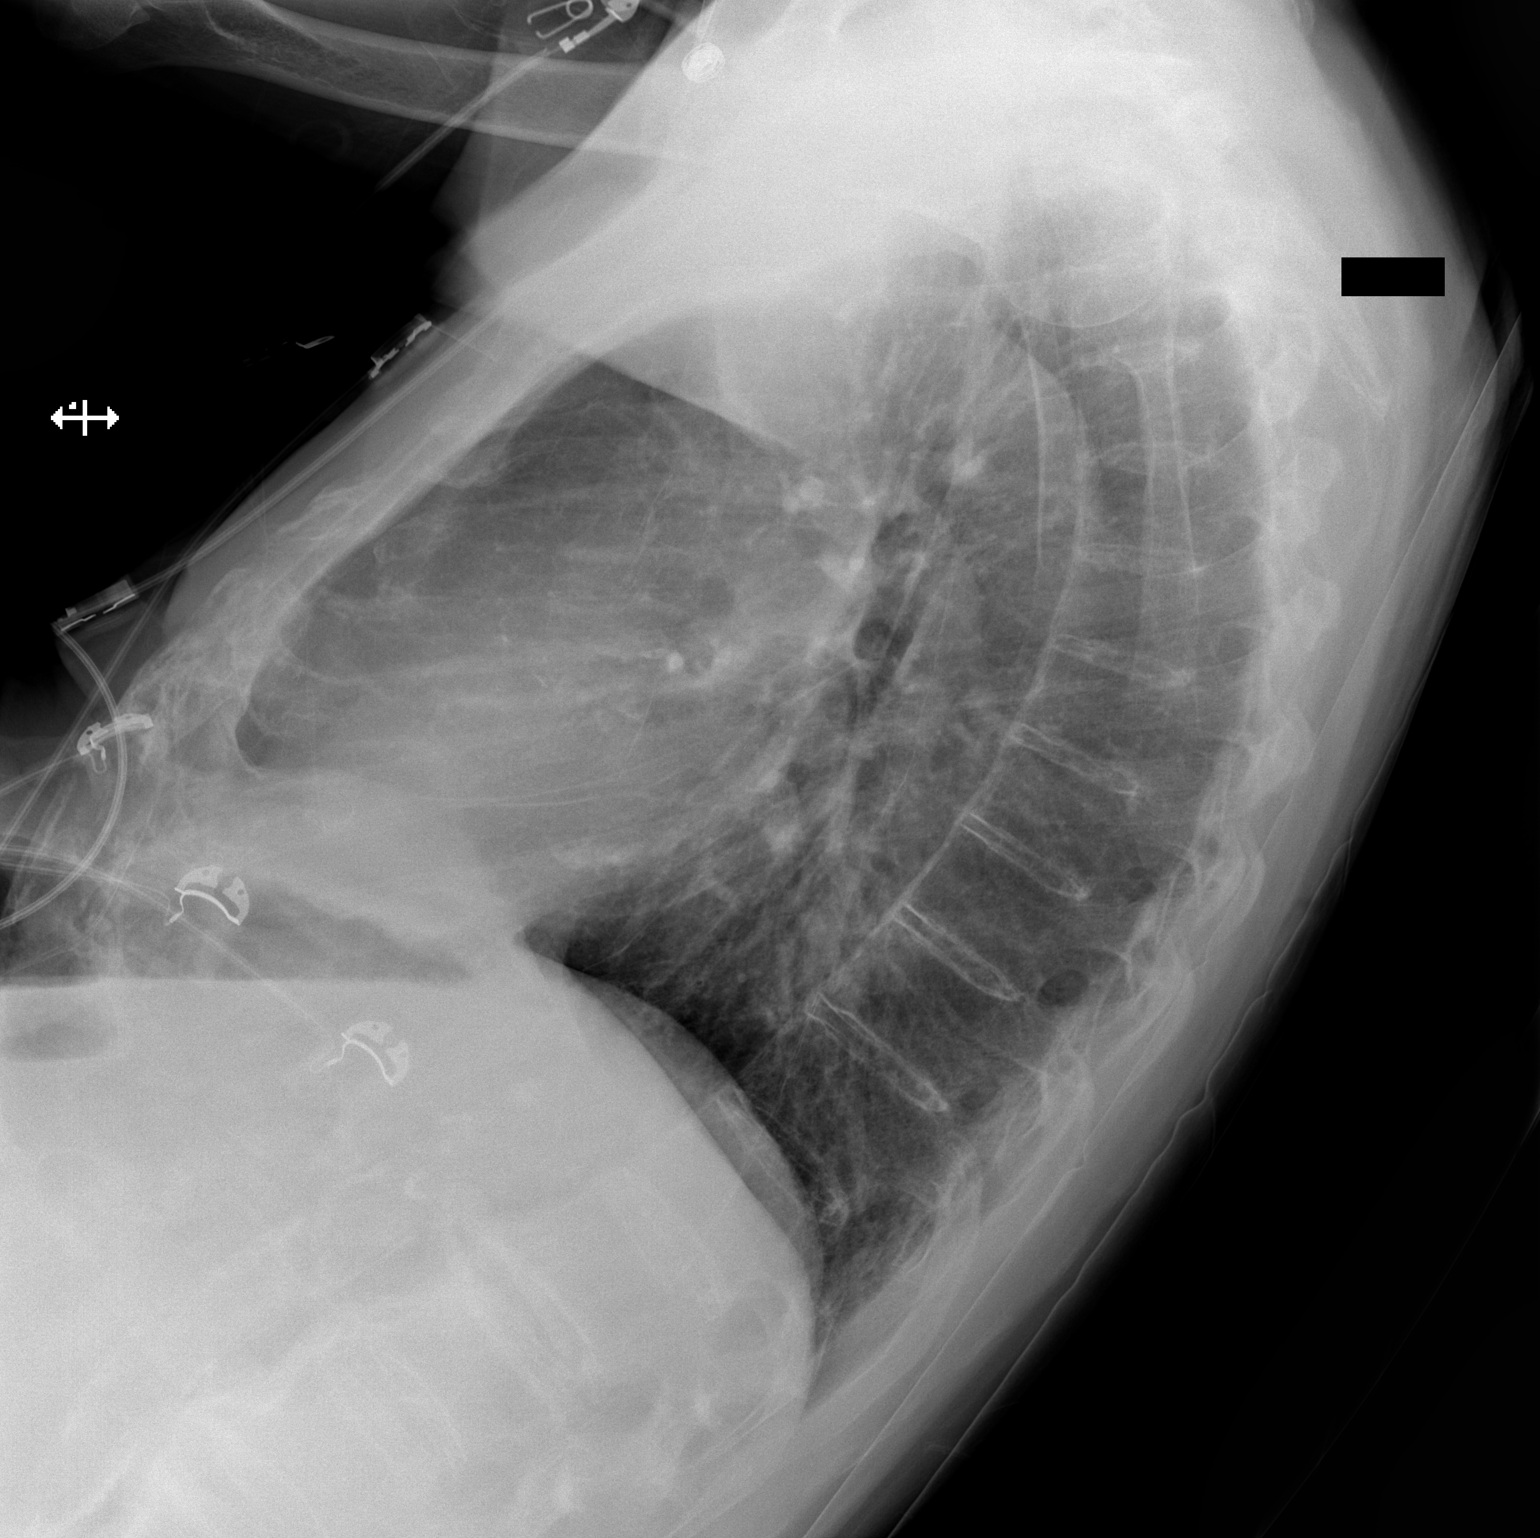

[x chest ap]
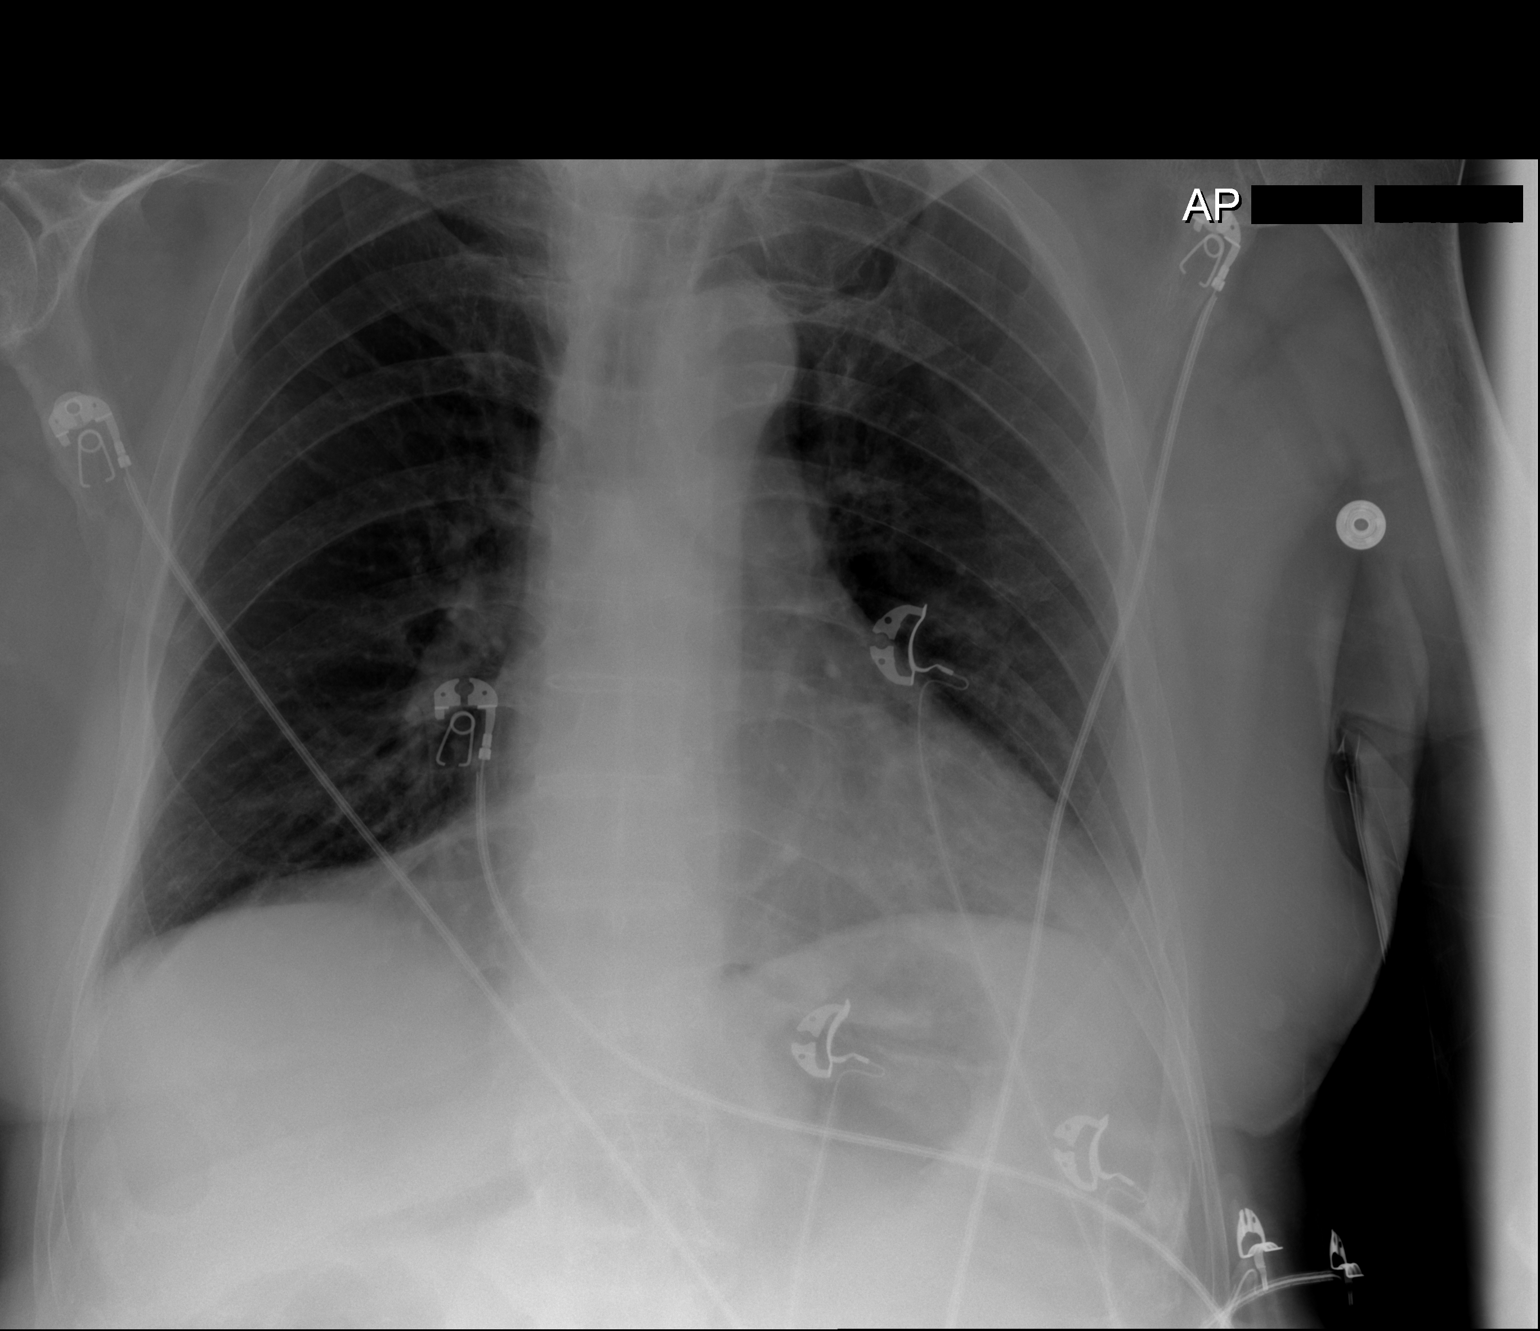

[2 of 2 positions shown; findings below may reference images not displayed]

FINDINGS: Borderline heart size. Negative aortic and hilar contours. There is
no edema, air bronchogram, effusion, or pneumothorax. Syndesmophytes
with multilevel thoracic ankylosis. No acute osseous finding.
IMPRESSION: 1. No convincing pneumonia or other acute disease today.
2. Findings of ankylosing spondylitis in the thoracic spine.

## 2018-11-29 ENCOUNTER — Other Ambulatory Visit: Payer: Self-pay | Admitting: Internal Medicine

## 2018-12-26 DIAGNOSIS — H348112 Central retinal vein occlusion, right eye, stable: Secondary | ICD-10-CM | POA: Diagnosis not present

## 2018-12-26 DIAGNOSIS — H353221 Exudative age-related macular degeneration, left eye, with active choroidal neovascularization: Secondary | ICD-10-CM | POA: Diagnosis not present

## 2019-01-02 ENCOUNTER — Encounter: Payer: Self-pay | Admitting: Internal Medicine

## 2019-01-02 ENCOUNTER — Ambulatory Visit (INDEPENDENT_AMBULATORY_CARE_PROVIDER_SITE_OTHER): Payer: Medicare Other | Admitting: Internal Medicine

## 2019-01-02 ENCOUNTER — Other Ambulatory Visit: Payer: Self-pay

## 2019-01-02 VITALS — BP 140/80 | HR 70 | Temp 97.3°F | Ht 64.0 in | Wt 175.6 lb

## 2019-01-02 DIAGNOSIS — N1831 Chronic kidney disease, stage 3a: Secondary | ICD-10-CM

## 2019-01-02 DIAGNOSIS — I10 Essential (primary) hypertension: Secondary | ICD-10-CM | POA: Diagnosis not present

## 2019-01-02 DIAGNOSIS — E7800 Pure hypercholesterolemia, unspecified: Secondary | ICD-10-CM | POA: Diagnosis not present

## 2019-01-02 DIAGNOSIS — H353130 Nonexudative age-related macular degeneration, bilateral, stage unspecified: Secondary | ICD-10-CM

## 2019-01-02 DIAGNOSIS — H44511 Absolute glaucoma, right eye: Secondary | ICD-10-CM

## 2019-01-02 DIAGNOSIS — E039 Hypothyroidism, unspecified: Secondary | ICD-10-CM

## 2019-01-02 DIAGNOSIS — Z0000 Encounter for general adult medical examination without abnormal findings: Secondary | ICD-10-CM | POA: Diagnosis not present

## 2019-01-02 DIAGNOSIS — N390 Urinary tract infection, site not specified: Secondary | ICD-10-CM

## 2019-01-02 LAB — CBC WITH DIFFERENTIAL/PLATELET
Basophils Absolute: 0 10*3/uL (ref 0.0–0.1)
Basophils Relative: 0.6 % (ref 0.0–3.0)
Eosinophils Absolute: 0.3 10*3/uL (ref 0.0–0.7)
Eosinophils Relative: 3.4 % (ref 0.0–5.0)
HCT: 44.5 % (ref 39.0–52.0)
Hemoglobin: 15.3 g/dL (ref 13.0–17.0)
Lymphocytes Relative: 26 % (ref 12.0–46.0)
Lymphs Abs: 1.9 10*3/uL (ref 0.7–4.0)
MCHC: 34.5 g/dL (ref 30.0–36.0)
MCV: 90.5 fl (ref 78.0–100.0)
Monocytes Absolute: 0.6 10*3/uL (ref 0.1–1.0)
Monocytes Relative: 8 % (ref 3.0–12.0)
Neutro Abs: 4.6 10*3/uL (ref 1.4–7.7)
Neutrophils Relative %: 62 % (ref 43.0–77.0)
Platelets: 176 10*3/uL (ref 150.0–400.0)
RBC: 4.91 Mil/uL (ref 4.22–5.81)
RDW: 14.1 % (ref 11.5–15.5)
WBC: 7.5 10*3/uL (ref 4.0–10.5)

## 2019-01-02 LAB — TSH: TSH: 5.02 u[IU]/mL — ABNORMAL HIGH (ref 0.35–4.50)

## 2019-01-02 LAB — POCT URINALYSIS DIPSTICK
Bilirubin, UA: NEGATIVE
Glucose, UA: NEGATIVE
Ketones, UA: NEGATIVE
Nitrite, UA: NEGATIVE
Protein, UA: POSITIVE — AB
Spec Grav, UA: 1.015 (ref 1.010–1.025)
Urobilinogen, UA: 0.2 E.U./dL
pH, UA: 6 (ref 5.0–8.0)

## 2019-01-02 LAB — COMPREHENSIVE METABOLIC PANEL
ALT: 30 U/L (ref 0–53)
AST: 27 U/L (ref 0–37)
Albumin: 4.6 g/dL (ref 3.5–5.2)
Alkaline Phosphatase: 85 U/L (ref 39–117)
BUN: 30 mg/dL — ABNORMAL HIGH (ref 6–23)
CO2: 27 mEq/L (ref 19–32)
Calcium: 9.8 mg/dL (ref 8.4–10.5)
Chloride: 104 mEq/L (ref 96–112)
Creatinine, Ser: 2.27 mg/dL — ABNORMAL HIGH (ref 0.40–1.50)
GFR: 27.22 mL/min — ABNORMAL LOW (ref >60.00)
Glucose, Bld: 106 mg/dL — ABNORMAL HIGH (ref 70–99)
Potassium: 4.3 mEq/L (ref 3.5–5.1)
Sodium: 140 mEq/L (ref 135–145)
Total Bilirubin: 1 mg/dL (ref 0.2–1.2)
Total Protein: 7.2 g/dL (ref 6.0–8.3)

## 2019-01-02 LAB — LIPID PANEL
Cholesterol: 193 mg/dL (ref 0–200)
HDL: 51.7 mg/dL (ref >39.00)
NonHDL: 141.3
Total CHOL/HDL Ratio: 4
Triglycerides: 231 mg/dL — ABNORMAL HIGH (ref 0.0–149.0)
VLDL: 46.2 mg/dL — ABNORMAL HIGH (ref 0.0–40.0)

## 2019-01-02 LAB — LDL CHOLESTEROL, DIRECT: Direct LDL: 104 mg/dL

## 2019-01-02 MED ORDER — SULFAMETHOXAZOLE-TRIMETHOPRIM 800-160 MG PO TABS: 1.0000 | ORAL_TABLET | Freq: Two times a day (BID) | ORAL | 0 refills | Status: AC

## 2019-01-02 NOTE — Progress Notes (Signed)
Established Patient Office Visit     This visit occurred during the SARS-CoV-2 public health emergency.  Safety protocols were in place, including screening questions prior to the visit, additional usage of staff PPE, and extensive cleaning of exam room while observing appropriate contact time as indicated for disinfecting solutions.    CC/Reason for Visit: Subsequent Medicare wellness visit and follow-up chronic conditions, discuss some acute concerns  HPI: Alexander Mcdonald is a 85 y.o. male who is coming in today for the above mentioned reasons. Past Medical History is significant for:  Hypertension that has been well controlled on amlodipine and lisinopril, stage III chronic kidney disease with a baseline creatinine of around 2, hypothyroidism that has been stable on Synthroid 50 mcg, also history of glaucoma and macular degeneration who follows routinely with his ophthalmologist.  He and his daughter have a series of acute concerns that they would like to discuss with me today:  1.  I wonder whether he has a repeat UTI, he has been having dysuria and increased urinary incontinence which is what frequently happens when he has a UTI.  2.  His legs have been itching, he has been scratching so much that he is causing excoriations.  3.  He is having increased coughing and phlegm at nighttime.  He has routine eye care, has not seen a dentist in years.  Does not exercise due to advanced age, is hard of hearing.  They have elected to defer further cancer screening given his advanced age, I agree.  Past Medical/Surgical History: Past Medical History:  Diagnosis Date  . ACUTE RENAL FAILURE W/LESION OF TUBULAR NECROSIS 01/04/2007  . Bladder spasm 06/04/2014  . BPH (benign prostatic hyperplasia) 06/04/2014  . CAROTID ARTERY DISEASE 06/01/2009  . DEPRESSION 07/25/2006  . DIVERTICULOSIS OF COLON 02/01/2007  . Glaucoma   . GOUT 07/25/2006  . HEMORRHOID, THROMBOSED 02/15/2009  .  HYPERCHOLESTEROLEMIA 07/25/2006  . HYPERTENSION 07/25/2006  . PROSTATE CANCER, HX OF 07/25/2006  . Rosacea 09/28/2008  . WEIGHT LOSS 09/01/2009    Past Surgical History:  Procedure Laterality Date  . BIOPSY  07/11/2017   Procedure: BIOPSY;  Surgeon: Gatha Mayer, MD;  Location: [facility];  Service: Endoscopy;;  . BRONCHIAL BRUSHINGS  07/11/2017   Procedure: ESOPHAGEAL BRUSHINGS;  Surgeon: Gatha Mayer, MD;  Location: [facility];  Service: Endoscopy;;  . CAROTID ENDARTERECTOMY    . CATARACT EXTRACTION    . ESOPHAGOGASTRODUODENOSCOPY (EGD) WITH PROPOFOL N/A 07/11/2017   Procedure: ESOPHAGOGASTRODUODENOSCOPY (EGD) WITH PROPOFOL;  Surgeon: Gatha Mayer, MD;  Location: [facility];  Service: Endoscopy;  Laterality: N/A;    Social History:  reports that he quit smoking about 30 years ago. He has never used smokeless tobacco. He reports current alcohol use of about 7.0 standard drinks of alcohol per week. He reports that he does not use drugs.  Allergies: Allergies  Allergen Reactions  . Shellfish Allergy Anaphylaxis    Pt is allergic to mussels   . Brimonidine Itching    Itching and swelling red  . Dorzolamide Hcl-Timolol Mal Other (See Comments)    Eye itch and redness  . Timolol Itching    Itching rash under eyes.    Family History:  History of heart disease, cancer, stroke that he is aware of  Current Outpatient Medications:  .  acetaminophen (TYLENOL) 325 MG tablet, Take 2 tablets (650 mg total) by mouth every 6 (six) hours as needed for mild pain (or Fever >/= 101)., Disp: ,  Rfl:  .  amLODipine (NORVASC) 2.5 MG tablet, TAKE 1 TABLET BY MOUTH EVERY DAY, Disp: 90 tablet, Rfl: 0 .  apraclonidine (IOPIDINE) 0.5 % ophthalmic solution, Place 1 drop into the left eye 2 times daily., Disp: , Rfl:  .  aspirin EC 81 MG tablet, Take 1 tablet (81 mg total) by mouth daily., Disp: , Rfl:  .  atorvastatin (LIPITOR) 10 MG tablet, TAKE 1 TABLET(10 MG) BY MOUTH DAILY, Disp: 90 tablet,  Rfl: 1 .  fish oil-omega-3 fatty acids 1000 MG capsule, Take 1 g by mouth 2 (two) times daily. , Disp: , Rfl:  .  fluticasone (FLONASE) 50 MCG/ACT nasal spray, SHAKE LIQUID AND USE 2 SPRAYS IN EACH NOSTRIL DAILY, Disp: 16 g, Rfl: 3 .  fosinopril (MONOPRIL) 20 MG tablet, TAKE 1 TABLET BY MOUTH EVERY DAY, Disp: 90 tablet, Rfl: 1 .  levocetirizine (XYZAL) 5 MG tablet, Take 1 tablet (5 mg total) by mouth every evening., Disp: 90 tablet, Rfl: 0 .  levothyroxine (SYNTHROID) 50 MCG tablet, TAKE 1 TABLET BY MOUTH DAILY, Disp: 90 tablet, Rfl: 0 .  Meth-Hyo-M Bl-Na Phos-Ph Sal (URIBEL) 118 MG CAPS, Take 1 capsule by mouth 3 (three) times daily as needed (urinary pain). , Disp: , Rfl:  .  Multiple Vitamin (MULTIVITAMIN WITH MINERALS) TABS tablet, Take 1 tablet by mouth daily., Disp: 30 tablet, Rfl: 0 .  Travoprost, BAK Free, (TRAVATAN Z) 0.004 % SOLN ophthalmic solution, Place 1 drop into the left eye every evening., Disp: , Rfl:  .  sulfamethoxazole-trimethoprim (BACTRIM DS) 800-160 MG tablet, Take 1 tablet by mouth 2 (two) times daily for 5 days., Disp: 10 tablet, Rfl: 0  Review of Systems:  Constitutional: Denies fever, chills, diaphoresis, appetite change and fatigue.  HEENT: Denies photophobia, eye pain, redness, hearing loss, ear pain, congestion, sore throat, rhinorrhea, sneezing, mouth sores, trouble swallowing, neck pain, neck stiffness and tinnitus.   Respiratory: Denies SOB, DOE, cough, chest tightness,  and wheezing.   Cardiovascular: Denies chest pain, palpitations and leg swelling.  Gastrointestinal: Denies nausea, vomiting, abdominal pain, diarrhea, constipation, blood in stool and abdominal distention.  Genitourinary: Denies dysuria, urgency, frequency, hematuria, flank pain and difficulty urinating.  Endocrine: Denies: hot or cold intolerance, sweats, changes in hair or nails, polyuria, polydipsia. Musculoskeletal: Denies myalgias, back pain, joint swelling, arthralgias and gait problem.    Skin: Denies pallor, rash and wound.  Neurological: Denies dizziness, seizures, syncope, weakness, light-headedness, numbness and headaches.  Hematological: Denies adenopathy. Easy bruising, personal or family bleeding history  Psychiatric/Behavioral: Denies suicidal ideation, mood changes, confusion, nervousness, sleep disturbance and agitation    Physical Exam: Vitals:   01/02/19 1412  BP: 140/80  Pulse: 70  Temp: (!) 97.3 F (36.3 C)  TempSrc: Temporal  SpO2: 97%  Weight: 175 lb 9.6 oz (79.7 kg)  Height: _0  (1.626 m)    Body mass index is 30.14 kg/m.    Constitutional: NAD, calm, comfortable Eyes: PERRL, lids and conjunctivae normal ENMT: Mucous membranes are moist.  Tympanic membrane is pearly white, no erythema or bulging, very hard of hearing Neck: normal, supple, no masses, no thyromegaly Respiratory: clear to auscultation bilaterally, no wheezing, no crackles. Normal respiratory effort. No accessory muscle use.  Cardiovascular: Regular rate and rhythm, no murmurs / rubs / gallops.  1+ pitting edema bilaterally. 2+ pedal pulses. No carotid bruits.  Abdomen: no tenderness, no masses palpated. No hepatosplenomegaly. Bowel sounds positive.  Musculoskeletal: no clubbing / cyanosis. No joint deformity upper and lower extremities.  Good ROM, no contractures. Normal muscle tone.  Skin: no rashes, lesions, ulcers. No induration Neurologic: Grossly intact and nonfocal, some generalized muscle weakness Psychiatric: Normal judgment and insight. Alert and oriented x 3. Normal mood.    Subsequent Medicare wellness visit   1. Risk factors, based on past  M,S,F -cardiovascular disease risk factors include age, gender, history of hyperlipidemia, history of hypertension   2.  Physical activities: Very sedentary   3.  Depression/mood:  Stable, not depressed   4.  Hearing:  Very hard of hearing, uses hearing aids   5.  ADL's: Needs help with cooking but he is able to do his  own grooming.   6.  Fall risk: Although he has not had any falls in the past year, I would consider him a moderate to high fall risk cgiven his age and generalized weakness.   7.  Home safety: No problems identified   8.  Height weight, and visual acuity: Height and weight as above, we have not tested his visual acuity as he is legally blind due to glaucoma and macular degeneration and follows routinely with his ophthalmologist.   9.  Counseling:  We have discussed healthy lifestyle   10. Lab orders based on risk factors: Laboratory update will be reviewed   11. Referral :  None today   12. Care plan:  Follow-up with me in 6 months   13. Cognitive assessment:  No cognitive impairment   14. Screening: Patient provided with a written and personalized 5-10 year screening schedule in the AVS.   yes   15. Provider List Update:   PCP, ophthalmology, urology  16. Advance Directives: He remains a full code     Office Visit from 01/02/2019 in [place] [facility] [place]  PHQ-9 Total Score  0      Fall Risk  01/02/2019 01/22/2018 12/04/2017 09/17/2015 09/15/2014  Falls in the past year? 0 0 0 No No  Comment - - Emmi Telephone Survey: data to providers prior to load - -  Number falls in past yr: 0 0 - - -  Injury with Fall? 0 0 - - -     Impression and Plan:  Encounter for preventive health examination -He has routine eye care, has no remaining teeth. -All immunizations are up-to-date. -Screening labs today. -Healthy lifestyle has been discussed in detail. -He has elected to defer further cancer screening given age, I agree.  Hypothyroidism, unspecified type  -Check TSH, continue Synthroid  Recurrent UTI  -Urine dipstick in office today is positive for leukocytes, nitrates, blood. -Will treat empirically as a UTI with Bactrim for 5 days, urine will be sent for UA and culture.  Stage 3a chronic kidney disease -Last known creatinine was 2.060 in February, recheck renal  function today.  Bilateral nonexudative age-related macular degeneration, unspecified stage Absolute glaucoma of right eye -Followed by ophthalmology  HYPERCHOLESTEROLEMIA - -check lipids today, he is on Lipitor 10 mg.  Essential hypertension -Fair control on current regimen.  Dry skin -Have advised that they apply a moisturizing lotion such as Aquaphor or Eucerin to his skin twice daily, suspect that his pruritus is due to this, no rashes are noted on exam.   Patient Instructions  -Nice seeing you today!!  -Lab work today; will notify you once results are available.  -You have a UTI. Bactrim twice daily for 5 days.  -Eucerin lotion twice daily.  -Moist air humidifier at night.   Preventive Care 29 Years and Older, Male Preventive  care refers to lifestyle choices and visits with your health care provider that can promote health and wellness. This includes:  A yearly physical exam. This is also called an annual well check.  Regular dental and eye exams.  Immunizations.  Screening for certain conditions.  Healthy lifestyle choices, such as diet and exercise. What can I expect for my preventive care visit? Physical exam Your health care provider will check:  Height and weight. These may be used to calculate body mass index (BMI), which is a measurement that tells if you are at a healthy weight.  Heart rate and blood pressure.  Your skin for abnormal spots. Counseling Your health care provider may ask you questions about:  Alcohol, tobacco, and drug use.  Emotional well-being.  Home and relationship well-being.  Sexual activity.  Eating habits.  History of falls.  Memory and ability to understand (cognition).  Work and work Statistician. What immunizations do I need?  Influenza (flu) vaccine  This is recommended every year. Tetanus, diphtheria, and pertussis (Tdap) vaccine  You may need a Td booster every 10 years. Varicella (chickenpox)  vaccine  You may need this vaccine if you have not already been vaccinated. Zoster (shingles) vaccine  You may need this after age 72. Pneumococcal conjugate (PCV13) vaccine  One dose is recommended after age 37. Pneumococcal polysaccharide (PPSV23) vaccine  One dose is recommended after age 108. Measles, mumps, and rubella (MMR) vaccine  You may need at least one dose of MMR if you were born in 1957 or later. You may also need a second dose. Meningococcal conjugate (MenACWY) vaccine  You may need this if you have certain conditions. Hepatitis A vaccine  You may need this if you have certain conditions or if you travel or work in places where you may be exposed to hepatitis A. Hepatitis B vaccine  You may need this if you have certain conditions or if you travel or work in places where you may be exposed to hepatitis B. Haemophilus influenzae type b (Hib) vaccine  You may need this if you have certain conditions. You may receive vaccines as individual doses or as more than one vaccine together in one shot (combination vaccines). Talk with your health care provider about the risks and benefits of combination vaccines. What tests do I need? Blood tests  Lipid and cholesterol levels. These may be checked every 5 years, or more frequently depending on your overall health.  Hepatitis C test.  Hepatitis B test. Screening  Lung cancer screening. You may have this screening every year starting at age 44 if you have a 30-pack-year history of smoking and currently smoke or have quit within the past 15 years.  Colorectal cancer screening. All adults should have this screening starting at age 62 and continuing until age 47. Your health care provider may recommend screening at age 63 if you are at increased risk. You will have tests every 1-10 years, depending on your results and the type of screening test.  Prostate cancer screening. Recommendations will vary depending on your family  history and other risks.  Diabetes screening. This is done by checking your blood sugar (glucose) after you have not eaten for a while (fasting). You may have this done every 1-3 years.  Abdominal aortic aneurysm (AAA) screening. You may need this if you are a current or former smoker.  Sexually transmitted disease (STD) testing. Follow these instructions at home: Eating and drinking  Eat a diet that includes fresh fruits and  vegetables, whole grains, lean protein, and low-fat dairy products. Limit your intake of foods with high amounts of sugar, saturated fats, and salt.  Take vitamin and mineral supplements as recommended by your health care provider.  Do not drink alcohol if your health care provider tells you not to drink.  If you drink alcohol: ? Limit how much you have to 0-2 drinks a day. ? Be aware of how much alcohol is in your drink. In the [place] one drink equals one 12 oz bottle of beer (355 mL), one 5 oz glass of wine (148 mL), or one 1 oz glass of hard liquor (44 mL). Lifestyle  Take daily care of your teeth and gums.  Stay active. Exercise for at least 30 minutes on 5 or more days each week.  Do not use any products that contain nicotine or tobacco, such as cigarettes, e-cigarettes, and chewing tobacco. If you need help quitting, ask your health care provider.  If you are sexually active, practice safe sex. Use a condom or other form of protection to prevent STIs (sexually transmitted infections).  Talk with your health care provider about taking a low-dose aspirin or statin. What's next?  Visit your health care provider once a year for a well check visit.  Ask your health care provider how often you should have your eyes and teeth checked.  Stay up to date on all vaccines. This information is not intended to replace advice given to you by your health care provider. Make sure you discuss any questions you have with your health care provider. Document Released:  01/29/2015 Document Revised: 12/27/2017 Document Reviewed: 12/27/2017 Elsevier Patient Education  2020 [place], MD [facility]

## 2019-01-02 NOTE — Patient Instructions (Addendum)
-Nice seeing you today!!  -Lab work today; will notify you once results are available.  -You have a UTI. Bactrim twice daily for 5 days.  -Eucerin lotion twice daily.  -Moist air humidifier at night.   Preventive Care 83 Years and Older, Male Preventive care refers to lifestyle choices and visits with your health care provider that can promote health and wellness. This includes:  A yearly physical exam. This is also called an annual well check.  Regular dental and eye exams.  Immunizations.  Screening for certain conditions.  Healthy lifestyle choices, such as diet and exercise. What can I expect for my preventive care visit? Physical exam Your health care provider will check:  Height and weight. These may be used to calculate body mass index (BMI), which is a measurement that tells if you are at a healthy weight.  Heart rate and blood pressure.  Your skin for abnormal spots. Counseling Your health care provider may ask you questions about:  Alcohol, tobacco, and drug use.  Emotional well-being.  Home and relationship well-being.  Sexual activity.  Eating habits.  History of falls.  Memory and ability to understand (cognition).  Work and work Statistician. What immunizations do I need?  Influenza (flu) vaccine  This is recommended every year. Tetanus, diphtheria, and pertussis (Tdap) vaccine  You may need a Td booster every 10 years. Varicella (chickenpox) vaccine  You may need this vaccine if you have not already been vaccinated. Zoster (shingles) vaccine  You may need this after age 51. Pneumococcal conjugate (PCV13) vaccine  One dose is recommended after age 60. Pneumococcal polysaccharide (PPSV23) vaccine  One dose is recommended after age 29. Measles, mumps, and rubella (MMR) vaccine  You may need at least one dose of MMR if you were born in 1957 or later. You may also need a second dose. Meningococcal conjugate (MenACWY) vaccine  You may  need this if you have certain conditions. Hepatitis A vaccine  You may need this if you have certain conditions or if you travel or work in places where you may be exposed to hepatitis A. Hepatitis B vaccine  You may need this if you have certain conditions or if you travel or work in places where you may be exposed to hepatitis B. Haemophilus influenzae type b (Hib) vaccine  You may need this if you have certain conditions. You may receive vaccines as individual doses or as more than one vaccine together in one shot (combination vaccines). Talk with your health care provider about the risks and benefits of combination vaccines. What tests do I need? Blood tests  Lipid and cholesterol levels. These may be checked every 5 years, or more frequently depending on your overall health.  Hepatitis C test.  Hepatitis B test. Screening  Lung cancer screening. You may have this screening every year starting at age 70 if you have a 30-pack-year history of smoking and currently smoke or have quit within the past 15 years.  Colorectal cancer screening. All adults should have this screening starting at age 52 and continuing until age 4. Your health care provider may recommend screening at age 55 if you are at increased risk. You will have tests every 1-10 years, depending on your results and the type of screening test.  Prostate cancer screening. Recommendations will vary depending on your family history and other risks.  Diabetes screening. This is done by checking your blood sugar (glucose) after you have not eaten for a while (fasting). You may have  this done every 1-3 years.  Abdominal aortic aneurysm (AAA) screening. You may need this if you are a current or former smoker.  Sexually transmitted disease (STD) testing. Follow these instructions at home: Eating and drinking  Eat a diet that includes fresh fruits and vegetables, whole grains, lean protein, and low-fat dairy products. Limit  your intake of foods with high amounts of sugar, saturated fats, and salt.  Take vitamin and mineral supplements as recommended by your health care provider.  Do not drink alcohol if your health care provider tells you not to drink.  If you drink alcohol: ? Limit how much you have to 0-2 drinks a day. ? Be aware of how much alcohol is in your drink. In the [place] one drink equals one 12 oz bottle of beer (355 mL), one 5 oz glass of wine (148 mL), or one 1 oz glass of hard liquor (44 mL). Lifestyle  Take daily care of your teeth and gums.  Stay active. Exercise for at least 30 minutes on 5 or more days each week.  Do not use any products that contain nicotine or tobacco, such as cigarettes, e-cigarettes, and chewing tobacco. If you need help quitting, ask your health care provider.  If you are sexually active, practice safe sex. Use a condom or other form of protection to prevent STIs (sexually transmitted infections).  Talk with your health care provider about taking a low-dose aspirin or statin. What's next?  Visit your health care provider once a year for a well check visit.  Ask your health care provider how often you should have your eyes and teeth checked.  Stay up to date on all vaccines. This information is not intended to replace advice given to you by your health care provider. Make sure you discuss any questions you have with your health care provider. Document Released: 01/29/2015 Document Revised: 12/27/2017 Document Reviewed: 12/27/2017 Elsevier Patient Education  2020 [employer].

## 2019-01-03 LAB — URINE CULTURE
MICRO NUMBER:: 1208713
Result:: NO GROWTH
SPECIMEN QUALITY:: ADEQUATE

## 2019-01-13 ENCOUNTER — Ambulatory Visit (INDEPENDENT_AMBULATORY_CARE_PROVIDER_SITE_OTHER): Payer: Medicare Other | Admitting: Podiatry

## 2019-01-13 ENCOUNTER — Other Ambulatory Visit: Payer: Self-pay

## 2019-01-13 DIAGNOSIS — M79674 Pain in right toe(s): Secondary | ICD-10-CM | POA: Diagnosis not present

## 2019-01-13 DIAGNOSIS — B351 Tinea unguium: Secondary | ICD-10-CM | POA: Diagnosis not present

## 2019-01-13 DIAGNOSIS — M79675 Pain in left toe(s): Secondary | ICD-10-CM

## 2019-01-16 NOTE — Progress Notes (Signed)
Subjective: 85 y.o. returns the office today for painful, elongated, thickened toenails which he cannot trim himself. Denies any redness or drainage around the nails. Denies any acute changes since last appointment and no new complaints today. Denies any systemic complaints such as fevers, chills, nausea, vomiting.   PCP: Isaac Bliss, Rayford Halsted, MD  Objective: NAD DP/PT pulses palpable, CRT less than 3 seconds Nails hypertrophic, dystrophic, elongated, brittle, discolored 10. There is tenderness overlying the nails 1-5 bilaterally. There is no surrounding erythema or drainage along the nail sites. No open lesions or pre-ulcerative lesions are identified. No other areas of tenderness bilateral lower extremities. No overlying edema, erythema, increased warmth. No pain with calf compression, swelling, warmth, erythema.  Assessment: Patient presents with symptomatic onychomycosis  Plan: -Treatment options including alternatives, risks, complications were discussed -Nails sharply debrided 10 without complication/bleeding. -Discussed daily foot inspection. If there are any changes, to call the office immediately.  -Follow-up in 3 months or sooner if any problems are to arise. In the meantime, encouraged to call the office with any questions, concerns, changes symptoms.  Celesta Gentile, DPM

## 2019-02-16 ENCOUNTER — Ambulatory Visit: Payer: Medicare Other

## 2019-02-20 DIAGNOSIS — H348112 Central retinal vein occlusion, right eye, stable: Secondary | ICD-10-CM | POA: Diagnosis not present

## 2019-02-20 DIAGNOSIS — H44511 Absolute glaucoma, right eye: Secondary | ICD-10-CM | POA: Diagnosis not present

## 2019-02-20 DIAGNOSIS — H353221 Exudative age-related macular degeneration, left eye, with active choroidal neovascularization: Secondary | ICD-10-CM | POA: Diagnosis not present

## 2019-02-20 DIAGNOSIS — Z961 Presence of intraocular lens: Secondary | ICD-10-CM | POA: Diagnosis not present

## 2019-02-20 DIAGNOSIS — H401123 Primary open-angle glaucoma, left eye, severe stage: Secondary | ICD-10-CM | POA: Diagnosis not present

## 2019-02-22 ENCOUNTER — Ambulatory Visit: Payer: Medicare Other | Attending: Internal Medicine

## 2019-02-22 DIAGNOSIS — Z23 Encounter for immunization: Secondary | ICD-10-CM | POA: Insufficient documentation

## 2019-02-22 NOTE — Progress Notes (Signed)
   Covid-19 Vaccination Clinic  Name:  Alexander Mcdonald    MRN: [id number] DOB: 26-Jan-1926  02/22/2019  Alexander Mcdonald was observed post Covid-19 immunization for 15 minutes without incidence. He was provided with Vaccine Information Sheet and instruction to access the V-Safe system.   Alexander Mcdonald was instructed to call 911 with any severe reactions post vaccine: Marland Kitchen Difficulty breathing  . Swelling of your face and throat  . A fast heartbeat  . A bad rash all over your body  . Dizziness and weakness    Immunizations Administered    Name Date Dose VIS Date Route   Pfizer COVID-19 Vaccine 02/22/2019  2:46 PM 0.3 mL 12/27/2018 Intramuscular   Manufacturer: [place]   Lot: [id number]   [place]: [id number]

## 2019-02-28 ENCOUNTER — Other Ambulatory Visit: Payer: Self-pay | Admitting: Internal Medicine

## 2019-03-04 ENCOUNTER — Other Ambulatory Visit: Payer: Self-pay | Admitting: Internal Medicine

## 2019-03-13 DIAGNOSIS — H44511 Absolute glaucoma, right eye: Secondary | ICD-10-CM | POA: Diagnosis not present

## 2019-03-13 DIAGNOSIS — H401123 Primary open-angle glaucoma, left eye, severe stage: Secondary | ICD-10-CM | POA: Diagnosis not present

## 2019-03-19 ENCOUNTER — Ambulatory Visit: Payer: Medicare Other | Attending: Internal Medicine

## 2019-03-19 DIAGNOSIS — Z23 Encounter for immunization: Secondary | ICD-10-CM | POA: Insufficient documentation

## 2019-03-19 NOTE — Progress Notes (Signed)
   Covid-19 Vaccination Clinic  Name:  Alexander Mcdonald    MRN: [id number] DOB: 02/04/1926  03/19/2019  [name] was observed post Covid-19 immunization for 15 minutes without incident. He was provided with Vaccine Information Sheet and instruction to access the V-Safe system.   [name] was instructed to call 911 with any severe reactions post vaccine: Marland Kitchen Difficulty breathing  . Swelling of face and throat  . A fast heartbeat  . A bad rash all over body  . Dizziness and weakness   Immunizations Administered    Name Date Dose VIS Date Route   Pfizer COVID-19 Vaccine 03/19/2019  1:03 PM 0.3 mL 12/27/2018 Intramuscular   Manufacturer: [place]   Lot: [place]   [place]: [id number]

## 2019-04-14 ENCOUNTER — Other Ambulatory Visit: Payer: Self-pay

## 2019-04-14 ENCOUNTER — Ambulatory Visit (INDEPENDENT_AMBULATORY_CARE_PROVIDER_SITE_OTHER): Payer: Medicare Other | Admitting: Podiatry

## 2019-04-14 ENCOUNTER — Encounter: Payer: Self-pay | Admitting: Podiatry

## 2019-04-14 DIAGNOSIS — B351 Tinea unguium: Secondary | ICD-10-CM

## 2019-04-14 DIAGNOSIS — Q828 Other specified congenital malformations of skin: Secondary | ICD-10-CM

## 2019-04-14 DIAGNOSIS — M79674 Pain in right toe(s): Secondary | ICD-10-CM

## 2019-04-14 DIAGNOSIS — M79675 Pain in left toe(s): Secondary | ICD-10-CM

## 2019-04-14 NOTE — Progress Notes (Signed)
Subjective: 86 y.o. returns the office today for painful, elongated, thickened toenails which he cannot trim himself. Denies any redness or drainage around the nails.Denies any systemic complaints such as fevers, chills, nausea, vomiting.   PCP: Isaac Bliss, Rayford Halsted, MD  Objective: NAD DP/PT pulses palpable, CRT less than 3 seconds Nails hypertrophic, dystrophic, elongated, brittle, discolored 10. There is tenderness overlying the nails 1-5 bilaterally. There is no surrounding erythema or drainage along the nail sites Hyperkeratotic lesion right posterior heel. No underlying ulceration, drainage, signs of infection. Dry skin.  No open lesions or pre-ulcerative lesions are identified. No other areas of tenderness bilateral lower extremities. No overlying edema, erythema, increased warmth. No pain with calf compression, swelling, warmth, erythema.  Assessment: Patient presents with symptomatic onychomycosis; hyperkeratotic lesion   Plan: -Treatment options including alternatives, risks, complications were discussed -Nails sharply debrided 10 without complication/bleeding. -Hyperkeratotic lesion sharply debrided x 1 without complications or bleeding.  -Discussed daily foot inspection. If there are any changes, to call the office immediately.  -Follow-up in 3 months or sooner if any problems are to arise. In the meantime, encouraged to call the office with any questions, concerns, changes symptoms.  Celesta Gentile, DPM

## 2019-04-17 DIAGNOSIS — H353221 Exudative age-related macular degeneration, left eye, with active choroidal neovascularization: Secondary | ICD-10-CM | POA: Diagnosis not present

## 2019-04-17 DIAGNOSIS — Z961 Presence of intraocular lens: Secondary | ICD-10-CM | POA: Diagnosis not present

## 2019-04-17 DIAGNOSIS — H348112 Central retinal vein occlusion, right eye, stable: Secondary | ICD-10-CM | POA: Diagnosis not present

## 2019-04-23 ENCOUNTER — Telehealth: Payer: Self-pay | Admitting: Internal Medicine

## 2019-04-23 ENCOUNTER — Other Ambulatory Visit: Payer: Self-pay

## 2019-04-23 NOTE — Telephone Encounter (Signed)
Appointment scheduled.

## 2019-04-23 NOTE — Telephone Encounter (Signed)
Needs visit for BP management

## 2019-04-23 NOTE — Telephone Encounter (Signed)
Pts daughter is calling in concerned about the pts Bp being in the 190's and want to know if she should be doing something different for him with his medication.  Pts daughter was also transferred to the triage nurse.

## 2019-04-24 ENCOUNTER — Encounter: Payer: Self-pay | Admitting: Internal Medicine

## 2019-04-24 ENCOUNTER — Telehealth: Payer: Self-pay | Admitting: *Deleted

## 2019-04-24 ENCOUNTER — Ambulatory Visit (INDEPENDENT_AMBULATORY_CARE_PROVIDER_SITE_OTHER): Payer: Medicare Other | Admitting: Internal Medicine

## 2019-04-24 VITALS — BP 190/60 | HR 45 | Temp 98.0°F

## 2019-04-24 DIAGNOSIS — I10 Essential (primary) hypertension: Secondary | ICD-10-CM

## 2019-04-24 MED ORDER — HYDROCHLOROTHIAZIDE 25 MG PO TABS: 25.0000 mg | ORAL_TABLET | Freq: Every day | ORAL | 3 refills | Status: DC

## 2019-04-24 NOTE — Telephone Encounter (Signed)
Daughter is calling because she states that she has already been giving the patient Amlodipine 5 mg.  Medication list updated.  Please advise.

## 2019-04-24 NOTE — Progress Notes (Signed)
Established Patient Office Visit     This visit occurred during the SARS-CoV-2 public health emergency.  Safety protocols were in place, including screening questions prior to the visit, additional usage of staff PPE, and extensive cleaning of exam room while observing appropriate contact time as indicated for disinfecting solutions.    CC/Reason for Visit: Follow-up for elevated blood pressure and weakness  HPI: LEDARRIUS BEAUCHAINE is a 86 y.o. male who is coming in today for the above mentioned reasons. Past Medical History is significant for: Hypertension that has been well controlled in the past, stage III chronic kidney disease with baseline creatinine around 2, hypothyroidism, glaucoma and macular degeneration.  He is brought in by his daughter today with concerns for elevated blood pressure.  Prior visit in December his blood pressure had been normal.  Daughter states that at home systolic blood pressure has been in the 160-190 range.  He has also been more sleepy.  She is concerned that at times his heart rate has been low.   Past Medical/Surgical History: Past Medical History:  Diagnosis Date  . ACUTE RENAL FAILURE W/LESION OF TUBULAR NECROSIS 01/04/2007  . Bladder spasm 06/04/2014  . BPH (benign prostatic hyperplasia) 06/04/2014  . CAROTID ARTERY DISEASE 06/01/2009  . DEPRESSION 07/25/2006  . DIVERTICULOSIS OF COLON 02/01/2007  . Glaucoma   . GOUT 07/25/2006  . HEMORRHOID, THROMBOSED 02/15/2009  . HYPERCHOLESTEROLEMIA 07/25/2006  . HYPERTENSION 07/25/2006  . PROSTATE CANCER, HX OF 07/25/2006  . Rosacea 09/28/2008  . WEIGHT LOSS 09/01/2009    Past Surgical History:  Procedure Laterality Date  . BIOPSY  07/11/2017   Procedure: BIOPSY;  Surgeon: Gatha Mayer, MD;  Location: [facility];  Service: Endoscopy;;  . BRONCHIAL BRUSHINGS  07/11/2017   Procedure: ESOPHAGEAL BRUSHINGS;  Surgeon: Gatha Mayer, MD;  Location: [facility];  Service: Endoscopy;;  . CAROTID ENDARTERECTOMY      . CATARACT EXTRACTION    . ESOPHAGOGASTRODUODENOSCOPY (EGD) WITH PROPOFOL N/A 07/11/2017   Procedure: ESOPHAGOGASTRODUODENOSCOPY (EGD) WITH PROPOFOL;  Surgeon: Gatha Mayer, MD;  Location: [facility];  Service: Endoscopy;  Laterality: N/A;    Social History:  reports that he quit smoking about 31 years ago. He has never used smokeless tobacco. He reports current alcohol use of about 7.0 standard drinks of alcohol per week. He reports that he does not use drugs.  Allergies: Allergies  Allergen Reactions  . Shellfish Allergy Anaphylaxis    Pt is allergic to mussels   . Brimonidine Itching    Itching and swelling red  . Dorzolamide Hcl-Timolol Mal Other (See Comments)    Eye itch and redness  . Timolol Itching    Itching rash under eyes.    Family History:  No family history of heart disease, cancer, stroke that he is aware of.  Current Outpatient Medications:  .  acetaminophen (TYLENOL) 325 MG tablet, Take 2 tablets (650 mg total) by mouth every 6 (six) hours as needed for mild pain (or Fever >/= 101)., Disp: , Rfl:  .  amLODipine (NORVASC) 2.5 MG tablet, TAKE 1 TABLET BY MOUTH EVERY DAY, Disp: 90 tablet, Rfl: 1 .  apraclonidine (IOPIDINE) 0.5 % ophthalmic solution, Place 1 drop into the left eye 2 times daily., Disp: , Rfl:  .  aspirin EC 81 MG tablet, Take 1 tablet (81 mg total) by mouth daily., Disp: , Rfl:  .  atorvastatin (LIPITOR) 10 MG tablet, TAKE 1 TABLET(10 MG) BY MOUTH DAILY, Disp: 90 tablet, Rfl:  1 .  fish oil-omega-3 fatty acids 1000 MG capsule, Take 1 g by mouth 2 (two) times daily. , Disp: , Rfl:  .  fluticasone (FLONASE) 50 MCG/ACT nasal spray, SHAKE LIQUID AND USE 2 SPRAYS IN EACH NOSTRIL DAILY, Disp: 16 g, Rfl: 3 .  fosinopril (MONOPRIL) 20 MG tablet, TAKE 1 TABLET BY MOUTH EVERY DAY, Disp: 90 tablet, Rfl: 1 .  levocetirizine (XYZAL) 5 MG tablet, Take 1 tablet (5 mg total) by mouth every evening., Disp: 90 tablet, Rfl: 0 .  levothyroxine (SYNTHROID) 50 MCG  tablet, TAKE 1 TABLET BY MOUTH DAILY, Disp: 90 tablet, Rfl: 1 .  Meth-Hyo-M Bl-Na Phos-Ph Sal (URIBEL) 118 MG CAPS, Take 1 capsule by mouth 3 (three) times daily as needed (urinary pain). , Disp: , Rfl:  .  Multiple Vitamin (MULTIVITAMIN WITH MINERALS) TABS tablet, Take 1 tablet by mouth daily., Disp: 30 tablet, Rfl: 0 .  Travoprost, BAK Free, (TRAVATAN Z) 0.004 % SOLN ophthalmic solution, Place 1 drop into the left eye every evening., Disp: , Rfl:  .  hydrochlorothiazide (HYDRODIURIL) 25 MG tablet, Take 1 tablet (25 mg total) by mouth daily., Disp: 90 tablet, Rfl: 3  Review of Systems:  Constitutional: Denies fever, chills, diaphoresis, appetite change. HEENT: Denies photophobia, eye pain, redness, hearing loss, ear pain, congestion, sore throat, rhinorrhea, sneezing, mouth sores, trouble swallowing, neck pain, neck stiffness and tinnitus.   Respiratory: Denies SOB, DOE, cough, chest tightness,  and wheezing.   Cardiovascular: Denies chest pain, palpitations and leg swelling.  Gastrointestinal: Denies nausea, vomiting, abdominal pain, diarrhea, constipation, blood in stool and abdominal distention.  Genitourinary: Denies dysuria, urgency, frequency, hematuria, flank pain and difficulty urinating.  Endocrine: Denies: hot or cold intolerance, sweats, changes in hair or nails, polyuria, polydipsia. Musculoskeletal: Denies myalgias, back pain, joint swelling, arthralgias and gait problem.  Skin: Denies pallor, rash and wound.  Neurological: Denies dizziness, seizures, syncope, weakness, light-headedness, numbness and headaches.  Hematological: Denies adenopathy. Easy bruising, personal or family bleeding history  Psychiatric/Behavioral: Denies suicidal ideation, mood changes, confusion, nervousness, sleep disturbance and agitation    Physical Exam: Vitals:   04/24/19 0934  BP: (!) 190/60  Pulse: (!) 45  Temp: 98 F (36.7 C)  TempSrc: Temporal  SpO2: 94%    There is no height or weight  on file to calculate BMI.   Constitutional: NAD, calm, comfortable Eyes: PERRL, lids and conjunctivae normal ENMT: Mucous membranes are moist.  Respiratory: clear to auscultation bilaterally, no wheezing, no crackles. Normal respiratory effort. No accessory muscle use.  Cardiovascular: Regular rate and rhythm, no murmurs / rubs / gallops.  2+ pitting edema bilaterally Neurologic: Grossly intact and nonfocal although he is generally weak Psychiatric: Normal judgment and insight. Alert and oriented x 3. Normal mood.    Impression and Plan:  Essential hypertension  -Blood pressure is indeed elevated in office today. -EKG was done in office today with concerns for potential heart block given bradycardia and weakness.  EKG shows ventricular bigeminy.  Do not believe requires any further follow-up at this time.  Rate 72. -He is on Norvasc 2.5 and lisinopril 20 mg. -As he has significant lower extremity edema we will go ahead and add hydrochlorothiazide 25 mg daily. -He will return in 6 weeks for blood pressure check and for basic metabolic profile to see how kidney function and electrolytes are faring on this diuretic therapy especially given his history of kidney disease.    Patient Instructions  -Nice seeing you today!!  -Start HCTZ  25 mg daily in addition to other medications.  -Schedule a visit in 6 weeks for blood pressure check and lab work to check kidney function.  -Low salt diet.     Lelon Frohlich, MD [facility]

## 2019-04-24 NOTE — Telephone Encounter (Signed)
Ok, just add HCTZ 25 mg daily as discussed.

## 2019-04-24 NOTE — Telephone Encounter (Signed)
Left message on machine for daughter to return our call 

## 2019-04-24 NOTE — Patient Instructions (Signed)
-  Nice seeing you today!!  -Start HCTZ 25 mg daily in addition to other medications.  -Schedule a visit in 6 weeks for blood pressure check and lab work to check kidney function.  -Low salt diet.

## 2019-04-24 NOTE — Telephone Encounter (Signed)
Daughter is aware 

## 2019-04-30 DIAGNOSIS — C61 Malignant neoplasm of prostate: Secondary | ICD-10-CM | POA: Diagnosis not present

## 2019-05-02 ENCOUNTER — Telehealth: Payer: Self-pay | Admitting: Internal Medicine

## 2019-05-02 NOTE — Telephone Encounter (Signed)
[name], pt's daughter ok per DPR, stated that two days after starting the water pill his Bp went down but now it has become higher daily. Yesterday at 5pm his bp was 229/115. She is wondering what they should do being that his is gradually getting higher day by day?   [name] can be reached at [phone]

## 2019-05-02 NOTE — Telephone Encounter (Signed)
Spoke to daughter and she agrees with Dr Ledell Noss recommendations.  She will take the patient to the ED if needed.

## 2019-05-02 NOTE — Telephone Encounter (Signed)
New BP meds can take 4-6 weeks to exert effect, ok to still have high numbers until that timeframe has passed. If still elevated in 4 weeks can increase his meds. If CP, SOB, vision disturbance or focal neurologic deficits with high BP, ED visit over weekend.

## 2019-05-27 ENCOUNTER — Telehealth (INDEPENDENT_AMBULATORY_CARE_PROVIDER_SITE_OTHER): Payer: Medicare Other | Admitting: Internal Medicine

## 2019-05-27 DIAGNOSIS — I10 Essential (primary) hypertension: Secondary | ICD-10-CM | POA: Diagnosis not present

## 2019-05-27 MED ORDER — AMLODIPINE BESYLATE 10 MG PO TABS: 10.0000 mg | ORAL_TABLET | Freq: Every day | ORAL | 1 refills | Status: DC

## 2019-05-27 NOTE — Progress Notes (Signed)
Virtual Visit via Video Note  I connected with Alexander Mcdonald on 05/27/19 at  3:45 PM EDT by a video enabled telemedicine application and verified that I am speaking with the correct person using two identifiers.  Location patient: home Location provider: work office Persons participating in the virtual visit: patient, provider, daughter  I discussed the limitations of evaluation and management by telemedicine and the availability of in person appointments. The patient expressed understanding and agreed to proceed.   HPI: This is a scheduled visit for BP follow up. At last visit on 4/8 we added HCTZ 25 mg to his regimen. We thought he was on fosinopril 20 and norvasc 2.5. Turns out he was on 5 mg of norvasc and not an ACE-I. BP has been 140-180/50-60s. No CP, SOB, HA, focal neurologic deficits.   ROS: Constitutional: Denies fever, chills, diaphoresis, appetite change and fatigue.  HEENT: Denies photophobia, eye pain, redness, hearing loss, ear pain, congestion, sore throat, rhinorrhea, sneezing, mouth sores, trouble swallowing, neck pain, neck stiffness and tinnitus.   Respiratory: Denies SOB, DOE, cough, chest tightness,  and wheezing.   Cardiovascular: Denies chest pain, palpitations and leg swelling.  Gastrointestinal: Denies nausea, vomiting, abdominal pain, diarrhea, constipation, blood in stool and abdominal distention.  Genitourinary: Denies dysuria, urgency, frequency, hematuria, flank pain and difficulty urinating.  Endocrine: Denies: hot or cold intolerance, sweats, changes in hair or nails, polyuria, polydipsia. Musculoskeletal: Denies myalgias, back pain, joint swelling, arthralgias and gait problem.  Skin: Denies pallor, rash and wound.  Neurological: Denies dizziness, seizures, syncope, weakness, light-headedness, numbness and headaches.  Hematological: Denies adenopathy. Easy bruising, personal or family bleeding history  Psychiatric/Behavioral: Denies suicidal  ideation, mood changes, confusion, nervousness, sleep disturbance and agitation   Past Medical History:  Diagnosis Date  . ACUTE RENAL FAILURE W/LESION OF TUBULAR NECROSIS 01/04/2007  . Bladder spasm 06/04/2014  . BPH (benign prostatic hyperplasia) 06/04/2014  . CAROTID ARTERY DISEASE 06/01/2009  . DEPRESSION 07/25/2006  . DIVERTICULOSIS OF COLON 02/01/2007  . Glaucoma   . GOUT 07/25/2006  . HEMORRHOID, THROMBOSED 02/15/2009  . HYPERCHOLESTEROLEMIA 07/25/2006  . HYPERTENSION 07/25/2006  . PROSTATE CANCER, HX OF 07/25/2006  . Rosacea 09/28/2008  . WEIGHT LOSS 09/01/2009    Past Surgical History:  Procedure Laterality Date  . BIOPSY  07/11/2017   Procedure: BIOPSY;  Surgeon: Gatha Mayer, MD;  Location: [facility];  Service: Endoscopy;;  . BRONCHIAL BRUSHINGS  07/11/2017   Procedure: ESOPHAGEAL BRUSHINGS;  Surgeon: Gatha Mayer, MD;  Location: [facility];  Service: Endoscopy;;  . CAROTID ENDARTERECTOMY    . CATARACT EXTRACTION    . ESOPHAGOGASTRODUODENOSCOPY (EGD) WITH PROPOFOL N/A 07/11/2017   Procedure: ESOPHAGOGASTRODUODENOSCOPY (EGD) WITH PROPOFOL;  Surgeon: Gatha Mayer, MD;  Location: [facility];  Service: Endoscopy;  Laterality: N/A;    No family history on file.  SOCIAL HX:   reports that he quit smoking about 31 years ago. He has never used smokeless tobacco. He reports current alcohol use of about 7.0 standard drinks of alcohol per week. He reports that he does not use drugs.   Current Outpatient Medications:  .  acetaminophen (TYLENOL) 325 MG tablet, Take 2 tablets (650 mg total) by mouth every 6 (six) hours as needed for mild pain (or Fever >/= 101)., Disp: , Rfl:  .  amLODipine (NORVASC) 10 MG tablet, Take 1 tablet (10 mg total) by mouth daily., Disp: 90 tablet, Rfl: 1 .  apraclonidine (IOPIDINE) 0.5 % ophthalmic solution, Place 1 drop  into the left eye 2 times daily., Disp: , Rfl:  .  aspirin EC 81 MG tablet, Take 1 tablet (81 mg total) by mouth daily., Disp: , Rfl:    .  atorvastatin (LIPITOR) 10 MG tablet, TAKE 1 TABLET(10 MG) BY MOUTH DAILY, Disp: 90 tablet, Rfl: 1 .  fish oil-omega-3 fatty acids 1000 MG capsule, Take 1 g by mouth 2 (two) times daily. , Disp: , Rfl:  .  fluticasone (FLONASE) 50 MCG/ACT nasal spray, SHAKE LIQUID AND USE 2 SPRAYS IN EACH NOSTRIL DAILY, Disp: 16 g, Rfl: 3 .  hydrochlorothiazide (HYDRODIURIL) 25 MG tablet, Take 1 tablet (25 mg total) by mouth daily., Disp: 90 tablet, Rfl: 3 .  levocetirizine (XYZAL) 5 MG tablet, Take 1 tablet (5 mg total) by mouth every evening., Disp: 90 tablet, Rfl: 0 .  levothyroxine (SYNTHROID) 50 MCG tablet, TAKE 1 TABLET BY MOUTH DAILY, Disp: 90 tablet, Rfl: 1 .  Meth-Hyo-M Bl-Na Phos-Ph Sal (URIBEL) 118 MG CAPS, Take 1 capsule by mouth 3 (three) times daily as needed (urinary pain). , Disp: , Rfl:  .  Multiple Vitamin (MULTIVITAMIN WITH MINERALS) TABS tablet, Take 1 tablet by mouth daily., Disp: 30 tablet, Rfl: 0 .  Travoprost, BAK Free, (TRAVATAN Z) 0.004 % SOLN ophthalmic solution, Place 1 drop into the left eye every evening., Disp: , Rfl:   EXAM:   VITALS per patient if applicable: none reported  GENERAL: alert, oriented, appears well and in no acute distress  HEENT: atraumatic, conjunttiva clear, no obvious abnormalities on inspection of external nose and ears  NECK: normal movements of the head and neck  LUNGS: on inspection no signs of respiratory distress, breathing rate appears normal, no obvious gross increased work of breathing, gasping or wheezing  CV: no obvious cyanosis  MS: moves all visible extremities without noticeable abnormality  PSYCH/NEURO: pleasant and cooperative, no obvious depression or anxiety, speech and thought processing grossly intact  ASSESSMENT AND PLAN:   Essential hypertension  -Increase norvasc from 5 to 10 mg. -F/u in office in 6 weeks. -Will need BMET at that time.     I discussed the assessment and treatment plan with the patient. The patient was  provided an opportunity to ask questions and all were answered. The patient agreed with the plan and demonstrated an understanding of the instructions.   The patient was advised to call back or seek an in-person evaluation if the symptoms worsen or if the condition fails to improve as anticipated.    Lelon Frohlich, MD  [facility]

## 2019-05-29 DIAGNOSIS — C61 Malignant neoplasm of prostate: Secondary | ICD-10-CM | POA: Diagnosis not present

## 2019-05-29 DIAGNOSIS — R351 Nocturia: Secondary | ICD-10-CM | POA: Diagnosis not present

## 2019-05-29 DIAGNOSIS — R8271 Bacteriuria: Secondary | ICD-10-CM | POA: Diagnosis not present

## 2019-06-02 ENCOUNTER — Encounter (HOSPITAL_COMMUNITY): Payer: Self-pay

## 2019-06-02 ENCOUNTER — Emergency Department (HOSPITAL_COMMUNITY): Payer: Medicare Other

## 2019-06-02 ENCOUNTER — Emergency Department (HOSPITAL_COMMUNITY)
Admission: EM | Admit: 2019-06-02 | Discharge: 2019-06-02 | Disposition: A | Discharge status: Home / Self Care | Payer: Medicare Other | Attending: Emergency Medicine | Admitting: Emergency Medicine

## 2019-06-02 ENCOUNTER — Other Ambulatory Visit: Payer: Self-pay

## 2019-06-02 ENCOUNTER — Telehealth: Payer: Self-pay | Admitting: Internal Medicine

## 2019-06-02 DIAGNOSIS — Y999 Unspecified external cause status: Secondary | ICD-10-CM | POA: Insufficient documentation

## 2019-06-02 DIAGNOSIS — Z8546 Personal history of malignant neoplasm of prostate: Secondary | ICD-10-CM | POA: Diagnosis not present

## 2019-06-02 DIAGNOSIS — I959 Hypotension, unspecified: Secondary | ICD-10-CM | POA: Diagnosis not present

## 2019-06-02 DIAGNOSIS — I129 Hypertensive chronic kidney disease with stage 1 through stage 4 chronic kidney disease, or unspecified chronic kidney disease: Secondary | ICD-10-CM | POA: Insufficient documentation

## 2019-06-02 DIAGNOSIS — N189 Chronic kidney disease, unspecified: Secondary | ICD-10-CM | POA: Insufficient documentation

## 2019-06-02 DIAGNOSIS — R519 Headache, unspecified: Secondary | ICD-10-CM | POA: Diagnosis not present

## 2019-06-02 DIAGNOSIS — R410 Disorientation, unspecified: Secondary | ICD-10-CM | POA: Insufficient documentation

## 2019-06-02 DIAGNOSIS — R0602 Shortness of breath: Secondary | ICD-10-CM | POA: Diagnosis not present

## 2019-06-02 DIAGNOSIS — Z7982 Long term (current) use of aspirin: Secondary | ICD-10-CM | POA: Insufficient documentation

## 2019-06-02 DIAGNOSIS — Z0489 Encounter for examination and observation for other specified reasons: Secondary | ICD-10-CM | POA: Diagnosis not present

## 2019-06-02 DIAGNOSIS — Y939 Activity, unspecified: Secondary | ICD-10-CM | POA: Insufficient documentation

## 2019-06-02 DIAGNOSIS — Z79899 Other long term (current) drug therapy: Secondary | ICD-10-CM | POA: Insufficient documentation

## 2019-06-02 DIAGNOSIS — R001 Bradycardia, unspecified: Secondary | ICD-10-CM | POA: Diagnosis not present

## 2019-06-02 DIAGNOSIS — R29898 Other symptoms and signs involving the musculoskeletal system: Secondary | ICD-10-CM

## 2019-06-02 DIAGNOSIS — Y92009 Unspecified place in unspecified non-institutional (private) residence as the place of occurrence of the external cause: Secondary | ICD-10-CM | POA: Insufficient documentation

## 2019-06-02 DIAGNOSIS — W1830XA Fall on same level, unspecified, initial encounter: Secondary | ICD-10-CM | POA: Diagnosis not present

## 2019-06-02 DIAGNOSIS — Z9181 History of falling: Secondary | ICD-10-CM

## 2019-06-02 DIAGNOSIS — Z87891 Personal history of nicotine dependence: Secondary | ICD-10-CM | POA: Insufficient documentation

## 2019-06-02 DIAGNOSIS — I10 Essential (primary) hypertension: Secondary | ICD-10-CM | POA: Diagnosis not present

## 2019-06-02 DIAGNOSIS — W19XXXA Unspecified fall, initial encounter: Secondary | ICD-10-CM | POA: Diagnosis not present

## 2019-06-02 LAB — COMPREHENSIVE METABOLIC PANEL
ALT: 43 U/L (ref 0–44)
AST: 28 U/L (ref 15–41)
Albumin: 3.7 g/dL (ref 3.5–5.0)
Alkaline Phosphatase: 114 U/L (ref 38–126)
Anion gap: 12 (ref 5–15)
BUN: 46 mg/dL — ABNORMAL HIGH (ref 8–23)
CO2: 25 mmol/L (ref 22–32)
Calcium: 9.6 mg/dL (ref 8.9–10.3)
Chloride: 103 mmol/L (ref 98–111)
Creatinine, Ser: 2.36 mg/dL — ABNORMAL HIGH (ref 0.61–1.24)
GFR calc Af Amer: 27 mL/min — ABNORMAL LOW (ref >60)
GFR calc non Af Amer: 23 mL/min — ABNORMAL LOW (ref >60)
Glucose, Bld: 120 mg/dL — ABNORMAL HIGH (ref 70–99)
Potassium: 3.7 mmol/L (ref 3.5–5.1)
Sodium: 140 mmol/L (ref 135–145)
Total Bilirubin: 1 mg/dL (ref 0.3–1.2)
Total Protein: 6.9 g/dL (ref 6.5–8.1)

## 2019-06-02 LAB — CBC
HCT: 44.4 % (ref 39.0–52.0)
Hemoglobin: 15.3 g/dL (ref 13.0–17.0)
MCH: 30.7 pg (ref 26.0–34.0)
MCHC: 34.5 g/dL (ref 30.0–36.0)
MCV: 89.2 fL (ref 80.0–100.0)
Platelets: 207 10*3/uL (ref 150–400)
RBC: 4.98 MIL/uL (ref 4.22–5.81)
RDW: 13.7 % (ref 11.5–15.5)
WBC: 10.4 10*3/uL (ref 4.0–10.5)
nRBC: 0 % (ref 0.0–0.2)

## 2019-06-02 NOTE — Discharge Instructions (Addendum)
Recommend taking the antibiotic as prescribed by your urologist.  If he develops fever, recurrent falls, inability to walk, vomiting or other new concerning symptom, recommend return to ER for reassessment.  Additionally recommend close follow-up with both your primary doctor and urologist.

## 2019-06-02 NOTE — ED Triage Notes (Signed)
Pt provided water to drink to promote urine collection.

## 2019-06-02 NOTE — Progress Notes (Signed)
CSW received a call from EDP stating pt's daughter prior to D/C states she interested in [facility] for her father and that she chooses [facility].  EDP agreeable to placing a consult for CM with an order for face-to-face and an order for Home Health and RN/Aide/PT/OT and Social Work.  CSW will leave a handoff for the 1st shift [facility] ED [facility] RN CM.  CSW will continue to follow for D/C needs.  Alphonse Guild. Adiya Selmer  MSW, LCSW, LCAS, CSI Transitions of Care Clinical Social Worker Care Coordination Department Ph: [phone]

## 2019-06-02 NOTE — ED Triage Notes (Signed)
Per EMS, Pt is coming from home. Pt had a witnessed fall today. Denies hitting head, back or neck pain. Pt was walking and his knees buckled from beneath him. Pts daughter stated that he also has a UTI that has only been treated for one week but has increased confusion that began prior to the fall. Daughter believes that it could be related to the UTI.

## 2019-06-02 NOTE — Telephone Encounter (Signed)
The patients daughter is wanting to speak with Dr. Jerilee Hoh about getting home health PT to work on his strength.  Also his feet and ankles are swelling.   Please advise

## 2019-06-03 ENCOUNTER — Telehealth: Payer: Self-pay | Admitting: Internal Medicine

## 2019-06-03 NOTE — Telephone Encounter (Signed)
Ok to order PT?

## 2019-06-03 NOTE — Telephone Encounter (Signed)
Patient's daughter called wanting to go ahead to get PT started.  Patient fell and had to go to the ED.  Daughter wants to use [facility] for PT.  She is wanting a return call about this issue.

## 2019-06-03 NOTE — Addendum Note (Signed)
Addended by: Westley Hummer B on: 06/03/2019 10:46 AM   Modules accepted: Orders

## 2019-06-03 NOTE — Progress Notes (Addendum)
06/03/2019 3:59 pm TOC CM contacted Amy, [facility] rep with new referral. Waiting call back with confirmation. Jonnie Finner RN CCM, [place] ED TOC CM [phone]  06/03/2019 715 pm [facility] accepted referral for [facility].[place], [place] ED TOC CM [phone]

## 2019-06-03 NOTE — Telephone Encounter (Signed)
Referral placed.

## 2019-06-03 NOTE — ED Provider Notes (Signed)
[place] [facility] DEPT Provider Note   CSN: [id number] Arrival date & time: 06/02/19  1921     History Chief Complaint  Patient presents with  . Fall   Alexander Mcdonald is a 86 y.o. male.  Presents to ER with concern for fall.  Patient states that while he was at home felt like his knees buckled from beneath him and he went down to the ground slowly.  Denies hitting his head, denies any neck, back pain.  No ongoing pain at this time.  Daughter reports that recently diagnosed with a UTI by his urologist and had tried taking an old Rx for antibiotics and did not have any improvement, today started taking the newly prescribed fosfomycin.  Has had 1 dose.  Concerned that patient has had some mild intermittent confusion over the last couple weeks, thought to be related to UTI.  Patient denies any confusion at this time.  Lives with daughter who is primary caregiver.  History of BPH, prostate cancer followed by urology.  Also has history of hypertension, CKD.  HPI     Past Medical History:  Diagnosis Date  . ACUTE RENAL FAILURE W/LESION OF TUBULAR NECROSIS 01/04/2007  . Bladder spasm 06/04/2014  . BPH (benign prostatic hyperplasia) 06/04/2014  . CAROTID ARTERY DISEASE 06/01/2009  . DEPRESSION 07/25/2006  . DIVERTICULOSIS OF COLON 02/01/2007  . Glaucoma   . GOUT 07/25/2006  . HEMORRHOID, THROMBOSED 02/15/2009  . HYPERCHOLESTEROLEMIA 07/25/2006  . HYPERTENSION 07/25/2006  . PROSTATE CANCER, HX OF 07/25/2006  . Rosacea 09/28/2008  . WEIGHT LOSS 09/01/2009    Patient Active Problem List   Diagnosis Date Noted  . Duodenal ulcer with hemorrhage and obstruction   . Erosive esophagitis   . GI bleed 07/11/2017  . Acute blood loss anemia 07/11/2017  . Nausea and vomiting 07/07/2017  . Hyponatremia 07/05/2017  . Hypothyroidism 07/05/2017  . Mechanical complication due to implant and internal device 12/18/2016  . Pseudophakia of both eyes 04/22/2015  . Exudative age-related  macular degeneration of left eye with active choroidal neovascularization ([place]) 01/12/2015  . Primary open angle glaucoma of left eye, severe stage 05/19/2013  . Recurrent UTI 04/10/2013  . Renal failure (ARF), acute on chronic (HCC) 12/25/2011  . Chronic kidney disease 09/12/2011  . Retinal or subretinal neovascularization 09/01/2011  . Central retinal vein occlusion 03/03/2011  . Macular degeneration 12/27/2010  . Cystoid macular edema of left eye 11/22/2010  . Absolute glaucoma of right eye 11/21/2010  . CAROTID ARTERY DISEASE 06/01/2009  . HEMORRHOID, THROMBOSED 02/15/2009  . ROSACEA 09/28/2008  . DIVERTICULOSIS OF COLON 02/01/2007  . HYPERCHOLESTEROLEMIA 07/25/2006  . GOUT 07/25/2006  . DEPRESSION 07/25/2006  . Essential hypertension 07/25/2006  . PROSTATE CANCER, HX OF 07/25/2006    Past Surgical History:  Procedure Laterality Date  . BIOPSY  07/11/2017   Procedure: BIOPSY;  Surgeon: Gatha Mayer, MD;  Location: [facility];  Service: Endoscopy;;  . BRONCHIAL BRUSHINGS  07/11/2017   Procedure: ESOPHAGEAL BRUSHINGS;  Surgeon: Gatha Mayer, MD;  Location: [facility];  Service: Endoscopy;;  . CAROTID ENDARTERECTOMY    . CATARACT EXTRACTION    . ESOPHAGOGASTRODUODENOSCOPY (EGD) WITH PROPOFOL N/A 07/11/2017   Procedure: ESOPHAGOGASTRODUODENOSCOPY (EGD) WITH PROPOFOL;  Surgeon: Gatha Mayer, MD;  Location: [facility];  Service: Endoscopy;  Laterality: N/A;       No family history on file.  Social History   Tobacco Use  . Smoking status: Former Smoker    Quit date: 01/17/1988  Years since quitting: 31.3  . Smokeless tobacco: Never Used  Substance Use Topics  . Alcohol use: Yes    Alcohol/week: 7.0 standard drinks    Types: 7 drink(s) per week    Comment: beer  . Drug use: No    Home Medications Prior to Admission medications   Medication Sig Start Date End Date Taking? Authorizing Provider  acetaminophen (TYLENOL) 325 MG tablet Take 2 tablets (650 mg  total) by mouth every 6 (six) hours as needed for mild pain (or Fever >/= 101). 07/13/17  Yes Debbe Odea, MD  amLODipine (NORVASC) 10 MG tablet Take 1 tablet (10 mg total) by mouth daily. 05/27/19  Yes Isaac Bliss, Rayford Halsted, MD  apraclonidine (IOPIDINE) 0.5 % ophthalmic solution Place 1 drop into the left eye in the morning and at bedtime.  12/03/17  Yes [provider]  Ascorbic Acid (VITAMIN C) 1000 MG tablet Take 1,000 mg by mouth daily.   Yes [provider]  aspirin EC 81 MG tablet Take 1 tablet (81 mg total) by mouth daily. 01/22/18  Yes Isaac Bliss, Rayford Halsted, MD  atorvastatin (LIPITOR) 10 MG tablet TAKE 1 TABLET(10 MG) BY MOUTH DAILY Patient taking differently: Take 10 mg by mouth daily.  03/04/19  Yes Isaac Bliss, Rayford Halsted, MD  brinzolamide (AZOPT) 1 % ophthalmic suspension Place 1 drop into the left eye in the morning and at bedtime.   Yes [provider]  cholecalciferol (VITAMIN D3) 25 MCG (1000 UNIT) tablet Take 1,000 Units by mouth daily.   Yes [provider]  fish oil-omega-3 fatty acids 1000 MG capsule Take 1 g by mouth 2 (two) times daily.    Yes [provider]  hydrochlorothiazide (HYDRODIURIL) 25 MG tablet Take 1 tablet (25 mg total) by mouth daily. 04/24/19  Yes Isaac Bliss, Rayford Halsted, MD  latanoprost (XALATAN) 0.005 % ophthalmic solution Place 1 drop into the left eye at bedtime. 04/28/19  Yes [provider]  levocetirizine (XYZAL) 5 MG tablet Take 1 tablet (5 mg total) by mouth every evening. 05/11/16  Yes Marletta Lor, MD  levothyroxine (SYNTHROID) 50 MCG tablet TAKE 1 TABLET BY MOUTH DAILY Patient taking differently: Take 50 mcg by mouth daily.  02/28/19  Yes Isaac Bliss, Rayford Halsted, MD  sodium chloride (MURO 128) 2 % ophthalmic solution Place 1 drop into the right eye daily.   Yes [provider]  zinc gluconate 50 MG tablet Take 50 mg by mouth daily.   Yes [provider]    fluticasone (FLONASE) 50 MCG/ACT nasal spray SHAKE LIQUID AND USE 2 SPRAYS IN [facility] NOSTRIL DAILY Patient not taking: Reported on 06/02/2019 10/17/17   Dorena Cookey, MD  Multiple Vitamin (MULTIVITAMIN WITH MINERALS) TABS tablet Take 1 tablet by mouth daily. Patient not taking: Reported on 06/02/2019 07/13/17   Debbe Odea, MD    Allergies    Shellfish allergy, Brimonidine, Brinzolamide-brimonidine, Dorzolamide hcl-timolol mal, and Timolol  Review of Systems   Review of Systems  Constitutional: Negative for chills and fever.  HENT: Negative for ear pain and sore throat.   Eyes: Negative for pain and visual disturbance.  Respiratory: Negative for cough and shortness of breath.   Cardiovascular: Negative for chest pain and palpitations.  Gastrointestinal: Negative for abdominal pain and vomiting.  Genitourinary: Negative for dysuria and hematuria.  Musculoskeletal: Negative for arthralgias and back pain.  Skin: Negative for color change and rash.  Neurological: Negative for seizures and syncope.  All other systems  reviewed and are negative.   Physical Exam Updated Vital Signs BP (!) 151/57   Pulse (!) 38   Temp 98.4 F (36.9 C) (Oral)   Resp 19   SpO2 94%   Physical Exam Vitals and nursing note reviewed.  Constitutional:      Appearance: He is well-developed.  HENT:     Head: Normocephalic and atraumatic.  Eyes:     Conjunctiva/sclera: Conjunctivae normal.  Cardiovascular:     Rate and Rhythm: Normal rate and regular rhythm.     Pulses: Normal pulses.     Heart sounds: No murmur.  Pulmonary:     Effort: Pulmonary effort is normal. No respiratory distress.     Breath sounds: Normal breath sounds.  Abdominal:     Palpations: Abdomen is soft.     Tenderness: There is no abdominal tenderness.  Musculoskeletal:     Cervical back: Neck supple.     Comments: No deformity or injury noted to bilateral lower extremities, bilateral upper extremities, there is no tenderness to  palpation throughout, distal sensation, cap refill intact throughout extremities  Back: no C T or L spine TTP  Skin:    General: Skin is warm and dry.     Capillary Refill: Capillary refill takes less than 2 seconds.  Neurological:     General: No focal deficit present.     Mental Status: He is alert and oriented to person, place, and time.  Psychiatric:        Mood and Affect: Mood normal.        Behavior: Behavior normal.     ED Results / Procedures / Treatments   Labs (all labs ordered are listed, but only abnormal results are displayed) Labs Reviewed  COMPREHENSIVE METABOLIC PANEL - Abnormal; Notable for the following components:      Result Value   Glucose, Bld 120 (*)    BUN 46 (*)    Creatinine, Ser 2.36 (*)    GFR calc non Af Amer 23 (*)    GFR calc Af Amer 27 (*)    All other components within normal limits  CBC  URINALYSIS, ROUTINE W REFLEX MICROSCOPIC    EKG EKG Interpretation  Date/Time:  Monday Jun 02 2019 20:03:03 EDT Ventricular Rate:  89 PR Interval:    QRS Duration: 79 QT Interval:  385 QTC Calculation: 372 R Axis:   -20 Text Interpretation: Sinus rhythm Ventricular bigeminy Probable left atrial enlargement Borderline left axis deviation Anterior infarct, old Confirmed by Madalyn Rob [phone]) on 06/02/2019 8:26:24 PM   Radiology CT Head Wo Contrast  Result Date: 06/02/2019 CLINICAL DATA:  Fall, headache EXAM: CT HEAD WITHOUT CONTRAST TECHNIQUE: Contiguous axial images were obtained from the base of the skull through the vertex without intravenous contrast. COMPARISON:  None. FINDINGS: Brain: Diffuse cerebral atrophy. Extensive chronic small vessel disease throughout the deep white matter. Old posterior left frontal infarct. No acute intracranial abnormality. Specifically, no hemorrhage, hydrocephalus, mass lesion, acute infarction, or significant intracranial injury. Vascular: No hyperdense vessel or unexpected calcification. Skull: No acute  calvarial abnormality. Sinuses/Orbits: Opacified left frontal sinus. Mucosal thickening within the ethmoid air cells and left maxillary sinus. Postoperative changes in the left orbit. Other: None IMPRESSION: Old left posterior frontal infarct. Atrophy, chronic microvascular disease. No acute intracranial abnormality. Electronically Signed   By: Rolm Baptise M.D.   On: 06/02/2019 21:04   DG Chest Portable 1 View  Result Date: 06/02/2019 CLINICAL DATA:  Shortness of breath. Fall today. EXAM: PORTABLE  CHEST 1 VIEW COMPARISON:  Radiograph 07/08/2017 FINDINGS: The cardiomediastinal contours are unchanged, stable upper normal heart size. Pulmonary vasculature is normal. No consolidation, pleural effusion, or pneumothorax. No acute osseous abnormalities are seen. IMPRESSION: No acute abnormality. Electronically Signed   By: Keith Rake M.D.   On: 06/02/2019 20:50    Procedures Procedures (including critical care time)  Medications Ordered in ED Medications - No data to display  ED Course  I have reviewed the triage vital signs and the nursing notes.  Pertinent labs & imaging results that were available during my care of the patient were reviewed by me and considered in my medical decision making (see chart for details).    MDM Rules/Calculators/A&P                      86 year old male presented to ER after fall.  On my assessment, there is no appreciable trauma, patient has no acute medical complaints at present.  Daughter was concerned about some intermittent confusion however he is alert, no ongoing confusion in ER.  Reports recent diagnosis of UTI and just started course of fosfomycin today.  Basic labs today were grossly within normal limits except noted creatinine, near baseline CKD.  [place] ordered UA to assess here however patient did not provide urine sample and did not want to wait in ER any longer.  Regardless of what UA would show, would recommend continuing the regimen that was  prescribed by his doctor. CT head without acute findings. Did have old stroke.  No prior for comparison.  EKG with ventricular bigeminy, EKG today is identical to last EKG on file in April.  Patient has no acute cardiac complaints. HR doc as low as 38; however while patient was on the monitor and on EKG, his ventricular rate was consistently in the 80s to 90s.  Stable BP, well-appearing, ambulatory.  Believe patient is appropriate for discharge and outpatient management. Recommended that patient have follow-up both with primary doctor and neurology.  Asked CM/SW to follow up with patient about home health resources.     After the discussed management above, the patient was determined to be safe for discharge.  The patient was in agreement with this plan and all questions regarding their care were answered.  ED return precautions were discussed and the patient will return to the ED with any significant worsening of condition.    Final Clinical Impression(s) / ED Diagnoses Final diagnoses:  Fall, initial encounter    Rx / DC Orders ED Discharge Orders    None       Lucrezia Starch, MD 06/03/19 [phone]

## 2019-06-04 ENCOUNTER — Other Ambulatory Visit: Payer: Self-pay

## 2019-06-04 ENCOUNTER — Encounter: Payer: Self-pay | Admitting: Infectious Disease

## 2019-06-04 ENCOUNTER — Ambulatory Visit (INDEPENDENT_AMBULATORY_CARE_PROVIDER_SITE_OTHER): Payer: Medicare Other | Admitting: Infectious Disease

## 2019-06-04 VITALS — BP 147/67 | HR 80 | Temp 97.9°F | Wt 165.0 lb

## 2019-06-04 DIAGNOSIS — N1831 Chronic kidney disease, stage 3a: Secondary | ICD-10-CM | POA: Diagnosis not present

## 2019-06-04 DIAGNOSIS — Z8546 Personal history of malignant neoplasm of prostate: Secondary | ICD-10-CM | POA: Diagnosis not present

## 2019-06-04 DIAGNOSIS — N390 Urinary tract infection, site not specified: Secondary | ICD-10-CM

## 2019-06-04 DIAGNOSIS — R2681 Unsteadiness on feet: Secondary | ICD-10-CM | POA: Diagnosis not present

## 2019-06-04 DIAGNOSIS — R001 Bradycardia, unspecified: Secondary | ICD-10-CM | POA: Insufficient documentation

## 2019-06-04 HISTORY — DX: Bradycardia, unspecified: R00.1

## 2019-06-04 HISTORY — DX: Unsteadiness on feet: R26.81

## 2019-06-04 MED ORDER — CIPROFLOXACIN HCL 500 MG PO TABS: 500.0000 mg | ORAL_TABLET | Freq: Every day | ORAL | 0 refills | Status: AC

## 2019-06-04 NOTE — Progress Notes (Signed)
Chief complaint: Dysuria progressive weakness and unsteadiness confusion Subjective:    Patient ID: Alexander Mcdonald, male    DOB: November 16, 1926, 86 y.o.   MRN: [id number]  HPI  86 year old man with hx significant for CKD, gout, prostate cancer who has had recurrent symptoms of dysuria, polyuria x 3 years when I last saw him which was in 2016.  He has been [facility] by Steele Berg and Kathie Rhodes. He previously would have resolution of his symptoms with use of antibiotic and antispasmodic but now continues to have recurrence of ssx shortly after stopping both antibiotic and antispasmodic.   He had repeatedly grown Pseudomonas Aeruginosa from his urine,  on 03/30/13 when he grew 75K R to cipro/levo, I to [place] but S to zosyn, IMI, Ceftaz and Cefepime.   He had continued to receive Cefdinir for this despite the fact that this cephalosporin has ZERO ANTI-PSEUDOMONAL activity.   IN talking to me it was not clear then  that he actually was suffering from genuine UTI's though he does have ssx of dysuria, polyruria and nausea occasionally with vomiting and even excessive sleeping.  He had no fevers, chills, weight loss or syncope or light headedness at that time.     I saw him clinic for first time in March 2015 and did UA which was with 0-2 WBC. Urine culture only yielded 55k Pseudomonas Aeruginosa and he had in fact improved in the interim.  He has had one epidsode of dysuria this am early but this has subsequently cleared.  I saw him a week later and he was doing fine and I encouraged use of antispasmodics and avoidance of antibiotics. SInce then he has been seen by PCP who has given him rx for [facility] and he has had DRAMATIC improvement in urinary spasm ssx. He had repeat UA and urine culture that was completely sterile. He had not resumed any abx. He has no systemic ssx.  At one point he had  stopped the [facility] and shortly after stopping it began having ssx of dysuria,  increasing frequency but apparently also less appetite though no fevers. He was seen by Urology where UA did not show WBC per daughter but Urine culture grew an organism. He took the fosfomycin and then had more symptoms a few weeks later and was again treated with fosfomycin. He had  minimal ssx with MYRBETRIQ when last seen.  In mid April he had NEW worsening of symptoms beyond what he typically has with frequency urgency, dysuria beyond what he typically experiences.   His daughter gave him a dose of fosfomycin with improvement of his symptoms. He also began taking "URIBEL" and symptoms resolved after taking 2 doses of fosfomycin and with the uribel. Then roughly week after stopping these he had recurrence of dysuria and a urine sample with analysis and culture was dropped off in our clinicUrinalysis did show copious white blood cells and culture eventually grew pseudomonas aeruginosa resistant to levofloxacin and ciprofloxacin but sensitive to other antipseudomonal agents. He had started back on Uribel and I also in the interim (not knowing he had taken fosfomycin already and (recommended him start fosfomycin again.   In the interim I did continue to be able to avoid giving him antibiotics and according to daughter who accompanied him he had not been on antibiotics until this year in December 2020.  He was at that time seeing Dr. Jerilee Hoh who took a urine analysis and culture.  UA showed 2+ leukocytes but  culture was actually sterile at the time.  In the interim he has had worsening fatigue.  Daughter reports that he had had his coronavirus vaccines and that some of the fatigue came on afterwards.  In the interim he was found to have an elevated PSA and is been evaluated by [facility].  He was also having more problems with nocturia and polyuria.  Of note he had also been prescribed thiazide diuretic.  Recently he is also had pain with urination and was seen by [facility] who  performed cystoscopic which was sent for culture and yielded Pseudomonas aeruginosa that was sensitive to most antibiotics available to treat Pseudomonas including ceftazidime cefepime imipenem meropenem ciprofloxacin and gentamicin.  He was prescribed Monurol by the urologist and referred back to [place].  His daughter actually had Monurol at home from several years ago and actually initiated this.  Unfortunately he did not have improvement even after 2 doses of the older Monurol.  She then began the newer prescription.  Unfortunately interim he had progressive confusion on Sunday and pain to the emergency department.  He had also one point in time had his knees buckled beneath him and gone down to the ground.  Lab work and CT scans were done in the ER including of the head.  His urine was not analyzed or sent for culture nor were blood cultures done.  He did have an EKG which showed some bigeminy.   Continue to have confusion despite further antimicrobial therapy.  Is not clear to me that this is again a urinary tract infection or not but certainly the infection and progress beyond the level of the bladder fosfomycin would not be sufficient to treat it      Review of Systems  Unable to perform ROS: Mental status change  Constitutional: Negative for activity change, appetite change, diaphoresis, fever and unexpected weight change.  HENT: Negative for congestion.   Eyes: Negative for photophobia.  Respiratory: Negative for cough, chest tightness, wheezing and stridor.   Cardiovascular: Negative for chest pain.  Gastrointestinal: Negative for abdominal distention, abdominal pain, constipation and diarrhea.  Genitourinary: Negative for dysuria.  Musculoskeletal: Negative for arthralgias and back pain.  Skin: Negative for color change and pallor.  Neurological: Negative for tremors.  Psychiatric/Behavioral: Negative for dysphoric mood and sleep disturbance.       Objective:   Physical  Exam  Constitutional: No distress.  HENT:  Head: Normocephalic and atraumatic.  Mouth/Throat: Oropharynx is clear and moist. No oropharyngeal exudate.  Eyes: Conjunctivae and EOM are normal. No scleral icterus.  Neck: No JVD present.  Cardiovascular: Normal rate, regular rhythm and normal heart sounds.  Pulmonary/Chest: Effort normal. No respiratory distress. He has no wheezes.  Abdominal: He exhibits no distension. There is no abdominal tenderness. There is no rebound.  Musculoskeletal:        General: Edema present. No tenderness.     Cervical back: Normal range of motion and neck supple.  Lymphadenopathy:    He has no cervical adenopathy.  Neurological: He is alert. He exhibits normal muscle tone. Coordination normal.  Skin: Skin is warm and dry. He is not diaphoretic. No erythema. No pallor.  Psychiatric: He is slowed. Cognition and memory are impaired. He exhibits abnormal recent memory and abnormal remote memory.  Nursing note and vitals reviewed.         Assessment & Plan:   Confusion, dizziness: If he actually does have an urinary tract infection I would be concerned for  a sending infection.  We will recheck his urine culture today and check blood cultures.  I have given a prescription for ciprofloxacin that he can take for 3 days.  Hopefully he will not have adverse effects of confusion from the fluoroquinolone but I do not have another oral drug to offer him.  I also wonder if his heart rate slowing into the 40s at times could be potentially causing some of his confusion  Also appears dehydrated and I have counseled them to stop the thiazide diuretic while he is still symptomatically confused and not taking in much in the way of fluids or food  If he fails to improve I have asked the daughter to bring him into the hospital for admission for further work-up and treatment.   Chronic cystitis: at times urinary tract infections --continue his antispasmodics --continue to  AVOID unnecessary antibiotics and avoid over-treating him with abx, the longer without abx the BETTER --At present I am going to check a repeat culture and give him ciprofloxacin.  Plan on seeing him back in 3 weeks time provided he improves    Prostate Cancer: followed by Urology PSA is up my understand they are not planning any interventions.

## 2019-06-05 ENCOUNTER — Telehealth: Payer: Self-pay | Admitting: Internal Medicine

## 2019-06-05 ENCOUNTER — Other Ambulatory Visit: Payer: Self-pay | Admitting: Internal Medicine

## 2019-06-05 DIAGNOSIS — R29898 Other symptoms and signs involving the musculoskeletal system: Secondary | ICD-10-CM

## 2019-06-05 DIAGNOSIS — R2681 Unsteadiness on feet: Secondary | ICD-10-CM | POA: Diagnosis not present

## 2019-06-05 DIAGNOSIS — R296 Repeated falls: Secondary | ICD-10-CM | POA: Diagnosis not present

## 2019-06-05 DIAGNOSIS — R627 Adult failure to thrive: Secondary | ICD-10-CM | POA: Diagnosis not present

## 2019-06-05 DIAGNOSIS — H409 Unspecified glaucoma: Secondary | ICD-10-CM | POA: Diagnosis not present

## 2019-06-05 DIAGNOSIS — N189 Chronic kidney disease, unspecified: Secondary | ICD-10-CM | POA: Diagnosis not present

## 2019-06-05 DIAGNOSIS — Z8546 Personal history of malignant neoplasm of prostate: Secondary | ICD-10-CM | POA: Diagnosis not present

## 2019-06-05 DIAGNOSIS — Z9181 History of falling: Secondary | ICD-10-CM

## 2019-06-05 DIAGNOSIS — N400 Enlarged prostate without lower urinary tract symptoms: Secondary | ICD-10-CM | POA: Diagnosis not present

## 2019-06-05 DIAGNOSIS — N390 Urinary tract infection, site not specified: Secondary | ICD-10-CM | POA: Diagnosis not present

## 2019-06-05 DIAGNOSIS — I129 Hypertensive chronic kidney disease with stage 1 through stage 4 chronic kidney disease, or unspecified chronic kidney disease: Secondary | ICD-10-CM | POA: Diagnosis not present

## 2019-06-05 DIAGNOSIS — M6281 Muscle weakness (generalized): Secondary | ICD-10-CM | POA: Diagnosis not present

## 2019-06-05 DIAGNOSIS — M109 Gout, unspecified: Secondary | ICD-10-CM | POA: Diagnosis not present

## 2019-06-05 NOTE — Telephone Encounter (Signed)
Fine with me

## 2019-06-05 NOTE — Telephone Encounter (Signed)
Left message on machine for Sabra. Orders for in home PT also faxed.

## 2019-06-05 NOTE — Telephone Encounter (Signed)
Sabra with [facility] [phone] would like orders placed to continue care and also would like an order for UA lab. Pt was seen in ED on 5/17.

## 2019-06-07 DIAGNOSIS — R296 Repeated falls: Secondary | ICD-10-CM | POA: Diagnosis not present

## 2019-06-07 DIAGNOSIS — N390 Urinary tract infection, site not specified: Secondary | ICD-10-CM | POA: Diagnosis not present

## 2019-06-07 DIAGNOSIS — N189 Chronic kidney disease, unspecified: Secondary | ICD-10-CM | POA: Diagnosis not present

## 2019-06-07 DIAGNOSIS — M109 Gout, unspecified: Secondary | ICD-10-CM | POA: Diagnosis not present

## 2019-06-07 DIAGNOSIS — M6281 Muscle weakness (generalized): Secondary | ICD-10-CM | POA: Diagnosis not present

## 2019-06-07 DIAGNOSIS — I129 Hypertensive chronic kidney disease with stage 1 through stage 4 chronic kidney disease, or unspecified chronic kidney disease: Secondary | ICD-10-CM | POA: Diagnosis not present

## 2019-06-09 ENCOUNTER — Telehealth: Payer: Self-pay | Admitting: Internal Medicine

## 2019-06-09 DIAGNOSIS — M109 Gout, unspecified: Secondary | ICD-10-CM | POA: Diagnosis not present

## 2019-06-09 DIAGNOSIS — I129 Hypertensive chronic kidney disease with stage 1 through stage 4 chronic kidney disease, or unspecified chronic kidney disease: Secondary | ICD-10-CM | POA: Diagnosis not present

## 2019-06-09 DIAGNOSIS — N189 Chronic kidney disease, unspecified: Secondary | ICD-10-CM | POA: Diagnosis not present

## 2019-06-09 DIAGNOSIS — R296 Repeated falls: Secondary | ICD-10-CM | POA: Diagnosis not present

## 2019-06-09 DIAGNOSIS — M6281 Muscle weakness (generalized): Secondary | ICD-10-CM | POA: Diagnosis not present

## 2019-06-09 DIAGNOSIS — N390 Urinary tract infection, site not specified: Secondary | ICD-10-CM | POA: Diagnosis not present

## 2019-06-09 NOTE — Telephone Encounter (Signed)
Will with [facility] is calling to get verbal orders for OT for 1 week 2, 2 week 2, and 1 week 1.

## 2019-06-10 DIAGNOSIS — R351 Nocturia: Secondary | ICD-10-CM | POA: Diagnosis not present

## 2019-06-10 DIAGNOSIS — I129 Hypertensive chronic kidney disease with stage 1 through stage 4 chronic kidney disease, or unspecified chronic kidney disease: Secondary | ICD-10-CM | POA: Diagnosis not present

## 2019-06-10 DIAGNOSIS — D649 Anemia, unspecified: Secondary | ICD-10-CM | POA: Diagnosis not present

## 2019-06-10 DIAGNOSIS — N184 Chronic kidney disease, stage 4 (severe): Secondary | ICD-10-CM | POA: Diagnosis not present

## 2019-06-10 LAB — CULTURE, BLOOD (SINGLE)
MICRO NUMBER:: 10498060
MICRO NUMBER:: 10498061
Result:: NO GROWTH
Result:: NO GROWTH
SPECIMEN QUALITY:: ADEQUATE
SPECIMEN QUALITY:: ADEQUATE

## 2019-06-10 NOTE — Telephone Encounter (Signed)
Verbal orders also given to Will

## 2019-06-10 NOTE — Telephone Encounter (Signed)
Verbal orders given to Will.  

## 2019-06-10 NOTE — Telephone Encounter (Signed)
Ok to order 

## 2019-06-11 ENCOUNTER — Ambulatory Visit: Payer: Medicare Other | Admitting: Infectious Disease

## 2019-06-11 DIAGNOSIS — I129 Hypertensive chronic kidney disease with stage 1 through stage 4 chronic kidney disease, or unspecified chronic kidney disease: Secondary | ICD-10-CM | POA: Diagnosis not present

## 2019-06-11 DIAGNOSIS — N189 Chronic kidney disease, unspecified: Secondary | ICD-10-CM | POA: Diagnosis not present

## 2019-06-11 DIAGNOSIS — N390 Urinary tract infection, site not specified: Secondary | ICD-10-CM | POA: Diagnosis not present

## 2019-06-11 DIAGNOSIS — M109 Gout, unspecified: Secondary | ICD-10-CM | POA: Diagnosis not present

## 2019-06-11 DIAGNOSIS — R296 Repeated falls: Secondary | ICD-10-CM | POA: Diagnosis not present

## 2019-06-11 DIAGNOSIS — M6281 Muscle weakness (generalized): Secondary | ICD-10-CM | POA: Diagnosis not present

## 2019-06-13 DIAGNOSIS — N189 Chronic kidney disease, unspecified: Secondary | ICD-10-CM | POA: Diagnosis not present

## 2019-06-13 DIAGNOSIS — N390 Urinary tract infection, site not specified: Secondary | ICD-10-CM | POA: Diagnosis not present

## 2019-06-13 DIAGNOSIS — I129 Hypertensive chronic kidney disease with stage 1 through stage 4 chronic kidney disease, or unspecified chronic kidney disease: Secondary | ICD-10-CM | POA: Diagnosis not present

## 2019-06-13 DIAGNOSIS — R296 Repeated falls: Secondary | ICD-10-CM | POA: Diagnosis not present

## 2019-06-13 DIAGNOSIS — M109 Gout, unspecified: Secondary | ICD-10-CM | POA: Diagnosis not present

## 2019-06-13 DIAGNOSIS — M6281 Muscle weakness (generalized): Secondary | ICD-10-CM | POA: Diagnosis not present

## 2019-06-17 DIAGNOSIS — N390 Urinary tract infection, site not specified: Secondary | ICD-10-CM | POA: Diagnosis not present

## 2019-06-17 DIAGNOSIS — M109 Gout, unspecified: Secondary | ICD-10-CM | POA: Diagnosis not present

## 2019-06-17 DIAGNOSIS — N189 Chronic kidney disease, unspecified: Secondary | ICD-10-CM | POA: Diagnosis not present

## 2019-06-17 DIAGNOSIS — R296 Repeated falls: Secondary | ICD-10-CM | POA: Diagnosis not present

## 2019-06-17 DIAGNOSIS — I129 Hypertensive chronic kidney disease with stage 1 through stage 4 chronic kidney disease, or unspecified chronic kidney disease: Secondary | ICD-10-CM | POA: Diagnosis not present

## 2019-06-17 DIAGNOSIS — M6281 Muscle weakness (generalized): Secondary | ICD-10-CM | POA: Diagnosis not present

## 2019-06-19 DIAGNOSIS — I129 Hypertensive chronic kidney disease with stage 1 through stage 4 chronic kidney disease, or unspecified chronic kidney disease: Secondary | ICD-10-CM | POA: Diagnosis not present

## 2019-06-19 DIAGNOSIS — N189 Chronic kidney disease, unspecified: Secondary | ICD-10-CM | POA: Diagnosis not present

## 2019-06-19 DIAGNOSIS — M6281 Muscle weakness (generalized): Secondary | ICD-10-CM | POA: Diagnosis not present

## 2019-06-19 DIAGNOSIS — R296 Repeated falls: Secondary | ICD-10-CM | POA: Diagnosis not present

## 2019-06-19 DIAGNOSIS — H353221 Exudative age-related macular degeneration, left eye, with active choroidal neovascularization: Secondary | ICD-10-CM | POA: Diagnosis not present

## 2019-06-19 DIAGNOSIS — N390 Urinary tract infection, site not specified: Secondary | ICD-10-CM | POA: Diagnosis not present

## 2019-06-19 DIAGNOSIS — H348112 Central retinal vein occlusion, right eye, stable: Secondary | ICD-10-CM | POA: Diagnosis not present

## 2019-06-19 DIAGNOSIS — H44511 Absolute glaucoma, right eye: Secondary | ICD-10-CM | POA: Diagnosis not present

## 2019-06-19 DIAGNOSIS — M109 Gout, unspecified: Secondary | ICD-10-CM | POA: Diagnosis not present

## 2019-06-23 DIAGNOSIS — M109 Gout, unspecified: Secondary | ICD-10-CM | POA: Diagnosis not present

## 2019-06-23 DIAGNOSIS — R296 Repeated falls: Secondary | ICD-10-CM | POA: Diagnosis not present

## 2019-06-23 DIAGNOSIS — N390 Urinary tract infection, site not specified: Secondary | ICD-10-CM | POA: Diagnosis not present

## 2019-06-23 DIAGNOSIS — N189 Chronic kidney disease, unspecified: Secondary | ICD-10-CM | POA: Diagnosis not present

## 2019-06-23 DIAGNOSIS — M6281 Muscle weakness (generalized): Secondary | ICD-10-CM | POA: Diagnosis not present

## 2019-06-23 DIAGNOSIS — I129 Hypertensive chronic kidney disease with stage 1 through stage 4 chronic kidney disease, or unspecified chronic kidney disease: Secondary | ICD-10-CM | POA: Diagnosis not present

## 2019-06-24 DIAGNOSIS — N390 Urinary tract infection, site not specified: Secondary | ICD-10-CM | POA: Diagnosis not present

## 2019-06-24 DIAGNOSIS — M109 Gout, unspecified: Secondary | ICD-10-CM | POA: Diagnosis not present

## 2019-06-24 DIAGNOSIS — I129 Hypertensive chronic kidney disease with stage 1 through stage 4 chronic kidney disease, or unspecified chronic kidney disease: Secondary | ICD-10-CM | POA: Diagnosis not present

## 2019-06-24 DIAGNOSIS — N189 Chronic kidney disease, unspecified: Secondary | ICD-10-CM | POA: Diagnosis not present

## 2019-06-24 DIAGNOSIS — M6281 Muscle weakness (generalized): Secondary | ICD-10-CM | POA: Diagnosis not present

## 2019-06-24 DIAGNOSIS — R296 Repeated falls: Secondary | ICD-10-CM | POA: Diagnosis not present

## 2019-06-25 ENCOUNTER — Encounter: Payer: Self-pay | Admitting: Infectious Disease

## 2019-06-25 ENCOUNTER — Telehealth: Payer: Self-pay

## 2019-06-25 ENCOUNTER — Ambulatory Visit (INDEPENDENT_AMBULATORY_CARE_PROVIDER_SITE_OTHER): Payer: Medicare Other | Admitting: Infectious Disease

## 2019-06-25 ENCOUNTER — Other Ambulatory Visit: Payer: Self-pay

## 2019-06-25 VITALS — BP 145/63 | HR 52 | Wt 164.0 lb

## 2019-06-25 DIAGNOSIS — R2681 Unsteadiness on feet: Secondary | ICD-10-CM | POA: Diagnosis not present

## 2019-06-25 DIAGNOSIS — R001 Bradycardia, unspecified: Secondary | ICD-10-CM | POA: Diagnosis not present

## 2019-06-25 DIAGNOSIS — Z8546 Personal history of malignant neoplasm of prostate: Secondary | ICD-10-CM

## 2019-06-25 DIAGNOSIS — N390 Urinary tract infection, site not specified: Secondary | ICD-10-CM

## 2019-06-25 NOTE — Telephone Encounter (Signed)
Called pt due to referral message from PT being unable to reach pt to schedule. I called 2x but no answer as well.

## 2019-06-25 NOTE — Progress Notes (Signed)
Chief complaint: Continued problems with confusion in particular when he is bradycardic Subjective:    Patient ID: Alexander Mcdonald, male    DOB: December 26, 1926, 86 y.o.   MRN: [id number]  HPI  87 year old man with hx significant for CKD, gout, prostate cancer who has had recurrent symptoms of dysuria, polyuria x 3 years when I last saw him which was in 2016.  He has been [facility] by Steele Berg and Kathie Rhodes. He previously would have resolution of his symptoms with use of antibiotic and antispasmodic but now continues to have recurrence of ssx shortly after stopping both antibiotic and antispasmodic.   He had repeatedly grown Pseudomonas Aeruginosa from his urine,  on 03/30/13 when he grew 75K R to cipro/levo, I to [place] but S to zosyn, IMI, Ceftaz and Cefepime.   He had continued to receive Cefdinir for this despite the fact that this cephalosporin has ZERO ANTI-PSEUDOMONAL activity.   IN talking to me it was not clear then  that he actually was suffering from genuine UTI's though he does have ssx of dysuria, polyruria and nausea occasionally with vomiting and even excessive sleeping.  He had no fevers, chills, weight loss or syncope or light headedness at that time.     I saw him clinic for first time in March 2015 and did UA which was with 0-2 WBC. Urine culture only yielded 55k Pseudomonas Aeruginosa and he had in fact improved in the interim.  He has had one epidsode of dysuria this am early but this has subsequently cleared.  I saw him a week later and he was doing fine and I encouraged use of antispasmodics and avoidance of antibiotics. SInce then he has been seen by PCP who has given him rx for [facility] and he has had DRAMATIC improvement in urinary spasm ssx. He had repeat UA and urine culture that was completely sterile. He had not resumed any abx. He has no systemic ssx.  At one point he had  stopped the [facility] and shortly after stopping it began having ssx of  dysuria, increasing frequency but apparently also less appetite though no fevers. He was seen by Urology where UA did not show WBC per daughter but Urine culture grew an organism. He took the fosfomycin and then had more symptoms a few weeks later and was again treated with fosfomycin. He had  minimal ssx with MYRBETRIQ when last seen.  In mid April he had NEW worsening of symptoms beyond what he typically has with frequency urgency, dysuria beyond what he typically experiences.   His daughter gave him a dose of fosfomycin with improvement of his symptoms. He also began taking "URIBEL" and symptoms resolved after taking 2 doses of fosfomycin and with the uribel. Then roughly week after stopping these he had recurrence of dysuria and a urine sample with analysis and culture was dropped off in our clinicUrinalysis did show copious white blood cells and culture eventually grew pseudomonas aeruginosa resistant to levofloxacin and ciprofloxacin but sensitive to other antipseudomonal agents. He had started back on Uribel and I also in the interim (not knowing he had taken fosfomycin already and (recommended him start fosfomycin again.   In the interim I did continue to be able to avoid giving him antibiotics and according to daughter who accompanied him he had not been on antibiotics until this year in December 2020.  He was at that time seeing Dr. Jerilee Hoh who took a urine analysis and culture.  UA  showed 2+ leukocytes but culture was actually sterile at the time.  In the interim he has had worsening fatigue.  Daughter reports that he had had his coronavirus vaccines and that some of the fatigue came on afterwards.  In the interim he was found to have an elevated PSA and is been evaluated by [facility].  He was also having more problems with nocturia and polyuria.  Of note he had also been prescribed thiazide diuretic.  Recently he is also had pain with urination and was [facility] who performed cystoscopic which was sent for culture and yielded Pseudomonas aeruginosa that was sensitive to most antibiotics available to treat Pseudomonas including ceftazidime cefepime imipenem meropenem ciprofloxacin and gentamicin.  He was prescribed Monurol by the urologist and referred back to [place].  His daughter actually had Monurol at home from several years ago and actually initiated this.  Unfortunately he did not have improvement even after 2 doses of the older Monurol.  She then began the newer prescription.  Unfortunately interim he had progressive confusion on Sunday and pain to the emergency department.  He had also one point in time had his knees buckled beneath him and gone down to the ground.  Lab work and CT scans were done in the ER including of the head.  His urine was not analyzed or sent for culture nor were blood cultures done.  He did have an EKG which showed some bigeminy.   Continue to have confusion despite further antimicrobial therapy.  It was not clear to me that this is again a urinary tract infection or not but certainly the infection and progress beyond the level of the bladder fosfomycin would not be sufficient to treat it  I saw him in clinic a few weeks ago and blood cultures and try to obtain urine cultures.  Could not produce urine for culture here but did so and then the culture was apparently sent to [facility] urology where showed absolutely no growth.  In the interim the patient's daughter had given him the remainder of his fosfomycin and given him the ciprofloxacin that I prescribed.  She thinks that he perhaps did improve a little bit and he was able to walk to the bathroom.  However he continues to have troubles with confusion in particular when his heart rate drops into the 40s.  Apparently when he is doing physical therapy and stimulate his heart rate will be more in the 70s and will be more alert.  I really think he needs  electrophysiologic evaluation as I think his bradycardia is causing his confusion not urinary tract infections     Review of Systems  Unable to perform ROS: Mental status change  Constitutional: Negative for activity change, appetite change, diaphoresis, fever and unexpected weight change.  HENT: Negative for congestion.   Eyes: Negative for photophobia.  Respiratory: Negative for cough, chest tightness, wheezing and stridor.   Cardiovascular: Negative for chest pain.  Gastrointestinal: Negative for abdominal distention, abdominal pain, constipation and diarrhea.  Genitourinary: Negative for dysuria.  Musculoskeletal: Negative for arthralgias and back pain.  Skin: Negative for color change and pallor.  Neurological: Negative for tremors.  Psychiatric/Behavioral: Negative for dysphoric mood and sleep disturbance.       Objective:   Physical Exam  Constitutional: No distress.  HENT:  Head: Normocephalic and atraumatic.  Mouth/Throat: Oropharynx is clear and moist. No oropharyngeal exudate.  Eyes: Conjunctivae and EOM are normal. No scleral icterus.  Neck: No  JVD present.  Cardiovascular: Normal rate, regular rhythm and normal heart sounds.  Pulmonary/Chest: Effort normal. No respiratory distress. He has no wheezes.  Abdominal: He exhibits no distension. There is no abdominal tenderness. There is no rebound.  Musculoskeletal:        General: Edema present. No tenderness.     Cervical back: Normal range of motion and neck supple.  Lymphadenopathy:    He has no cervical adenopathy.  Neurological: He is alert. He exhibits normal muscle tone. Coordination normal.  Skin: Skin is warm and dry. He is not diaphoretic. No erythema. No pallor.  Psychiatric: He is slowed. Cognition and memory are impaired. He exhibits abnormal recent memory and abnormal remote memory.  Nursing note and vitals reviewed.         Assessment & Plan:   Confusion, dizziness:   This seems to correlate  with him being bradycardic and I think he needs evaluation by electrophysiology I have made referral for him    Chronic cystitis: at times urinary tract infections --continue his antispasmodics --continue to AVOID unnecessary antibiotics and avoid over-treating him with abx, the longer without abx the BETTER -  Turn to clinic in 2 months time.   Prostate Cancer: followed by Urology PSA is up my understand they are not planning any interventions.

## 2019-06-26 DIAGNOSIS — I129 Hypertensive chronic kidney disease with stage 1 through stage 4 chronic kidney disease, or unspecified chronic kidney disease: Secondary | ICD-10-CM | POA: Diagnosis not present

## 2019-06-26 DIAGNOSIS — R296 Repeated falls: Secondary | ICD-10-CM | POA: Diagnosis not present

## 2019-06-26 DIAGNOSIS — N189 Chronic kidney disease, unspecified: Secondary | ICD-10-CM | POA: Diagnosis not present

## 2019-06-26 DIAGNOSIS — M109 Gout, unspecified: Secondary | ICD-10-CM | POA: Diagnosis not present

## 2019-06-26 DIAGNOSIS — M6281 Muscle weakness (generalized): Secondary | ICD-10-CM | POA: Diagnosis not present

## 2019-06-26 DIAGNOSIS — N390 Urinary tract infection, site not specified: Secondary | ICD-10-CM | POA: Diagnosis not present

## 2019-06-27 DIAGNOSIS — M109 Gout, unspecified: Secondary | ICD-10-CM | POA: Diagnosis not present

## 2019-06-27 DIAGNOSIS — M6281 Muscle weakness (generalized): Secondary | ICD-10-CM | POA: Diagnosis not present

## 2019-06-27 DIAGNOSIS — I129 Hypertensive chronic kidney disease with stage 1 through stage 4 chronic kidney disease, or unspecified chronic kidney disease: Secondary | ICD-10-CM | POA: Diagnosis not present

## 2019-06-27 DIAGNOSIS — N390 Urinary tract infection, site not specified: Secondary | ICD-10-CM | POA: Diagnosis not present

## 2019-06-27 DIAGNOSIS — R296 Repeated falls: Secondary | ICD-10-CM | POA: Diagnosis not present

## 2019-06-27 DIAGNOSIS — N189 Chronic kidney disease, unspecified: Secondary | ICD-10-CM | POA: Diagnosis not present

## 2019-06-30 DIAGNOSIS — R296 Repeated falls: Secondary | ICD-10-CM | POA: Diagnosis not present

## 2019-06-30 DIAGNOSIS — N189 Chronic kidney disease, unspecified: Secondary | ICD-10-CM | POA: Diagnosis not present

## 2019-06-30 DIAGNOSIS — I129 Hypertensive chronic kidney disease with stage 1 through stage 4 chronic kidney disease, or unspecified chronic kidney disease: Secondary | ICD-10-CM | POA: Diagnosis not present

## 2019-06-30 DIAGNOSIS — M109 Gout, unspecified: Secondary | ICD-10-CM | POA: Diagnosis not present

## 2019-06-30 DIAGNOSIS — N390 Urinary tract infection, site not specified: Secondary | ICD-10-CM | POA: Diagnosis not present

## 2019-06-30 DIAGNOSIS — M6281 Muscle weakness (generalized): Secondary | ICD-10-CM | POA: Diagnosis not present

## 2019-07-01 DIAGNOSIS — M109 Gout, unspecified: Secondary | ICD-10-CM | POA: Diagnosis not present

## 2019-07-01 DIAGNOSIS — N189 Chronic kidney disease, unspecified: Secondary | ICD-10-CM | POA: Diagnosis not present

## 2019-07-01 DIAGNOSIS — R296 Repeated falls: Secondary | ICD-10-CM | POA: Diagnosis not present

## 2019-07-01 DIAGNOSIS — I129 Hypertensive chronic kidney disease with stage 1 through stage 4 chronic kidney disease, or unspecified chronic kidney disease: Secondary | ICD-10-CM | POA: Diagnosis not present

## 2019-07-01 DIAGNOSIS — N390 Urinary tract infection, site not specified: Secondary | ICD-10-CM | POA: Diagnosis not present

## 2019-07-01 DIAGNOSIS — M6281 Muscle weakness (generalized): Secondary | ICD-10-CM | POA: Diagnosis not present

## 2019-07-04 DIAGNOSIS — I129 Hypertensive chronic kidney disease with stage 1 through stage 4 chronic kidney disease, or unspecified chronic kidney disease: Secondary | ICD-10-CM | POA: Diagnosis not present

## 2019-07-04 DIAGNOSIS — M109 Gout, unspecified: Secondary | ICD-10-CM | POA: Diagnosis not present

## 2019-07-04 DIAGNOSIS — N390 Urinary tract infection, site not specified: Secondary | ICD-10-CM | POA: Diagnosis not present

## 2019-07-04 DIAGNOSIS — M6281 Muscle weakness (generalized): Secondary | ICD-10-CM | POA: Diagnosis not present

## 2019-07-04 DIAGNOSIS — R296 Repeated falls: Secondary | ICD-10-CM | POA: Diagnosis not present

## 2019-07-04 DIAGNOSIS — N189 Chronic kidney disease, unspecified: Secondary | ICD-10-CM | POA: Diagnosis not present

## 2019-07-05 DIAGNOSIS — I129 Hypertensive chronic kidney disease with stage 1 through stage 4 chronic kidney disease, or unspecified chronic kidney disease: Secondary | ICD-10-CM | POA: Diagnosis not present

## 2019-07-05 DIAGNOSIS — M109 Gout, unspecified: Secondary | ICD-10-CM | POA: Diagnosis not present

## 2019-07-05 DIAGNOSIS — H409 Unspecified glaucoma: Secondary | ICD-10-CM | POA: Diagnosis not present

## 2019-07-05 DIAGNOSIS — R296 Repeated falls: Secondary | ICD-10-CM | POA: Diagnosis not present

## 2019-07-05 DIAGNOSIS — R627 Adult failure to thrive: Secondary | ICD-10-CM | POA: Diagnosis not present

## 2019-07-05 DIAGNOSIS — M6281 Muscle weakness (generalized): Secondary | ICD-10-CM | POA: Diagnosis not present

## 2019-07-05 DIAGNOSIS — Z8546 Personal history of malignant neoplasm of prostate: Secondary | ICD-10-CM | POA: Diagnosis not present

## 2019-07-05 DIAGNOSIS — N400 Enlarged prostate without lower urinary tract symptoms: Secondary | ICD-10-CM | POA: Diagnosis not present

## 2019-07-05 DIAGNOSIS — N390 Urinary tract infection, site not specified: Secondary | ICD-10-CM | POA: Diagnosis not present

## 2019-07-05 DIAGNOSIS — N189 Chronic kidney disease, unspecified: Secondary | ICD-10-CM | POA: Diagnosis not present

## 2019-07-05 DIAGNOSIS — R2681 Unsteadiness on feet: Secondary | ICD-10-CM | POA: Diagnosis not present

## 2019-07-08 DIAGNOSIS — M6281 Muscle weakness (generalized): Secondary | ICD-10-CM | POA: Diagnosis not present

## 2019-07-08 DIAGNOSIS — R296 Repeated falls: Secondary | ICD-10-CM | POA: Diagnosis not present

## 2019-07-08 DIAGNOSIS — M109 Gout, unspecified: Secondary | ICD-10-CM | POA: Diagnosis not present

## 2019-07-08 DIAGNOSIS — I129 Hypertensive chronic kidney disease with stage 1 through stage 4 chronic kidney disease, or unspecified chronic kidney disease: Secondary | ICD-10-CM | POA: Diagnosis not present

## 2019-07-08 DIAGNOSIS — N189 Chronic kidney disease, unspecified: Secondary | ICD-10-CM | POA: Diagnosis not present

## 2019-07-08 DIAGNOSIS — N390 Urinary tract infection, site not specified: Secondary | ICD-10-CM | POA: Diagnosis not present

## 2019-07-09 DIAGNOSIS — R296 Repeated falls: Secondary | ICD-10-CM | POA: Diagnosis not present

## 2019-07-09 DIAGNOSIS — N390 Urinary tract infection, site not specified: Secondary | ICD-10-CM | POA: Diagnosis not present

## 2019-07-09 DIAGNOSIS — I129 Hypertensive chronic kidney disease with stage 1 through stage 4 chronic kidney disease, or unspecified chronic kidney disease: Secondary | ICD-10-CM | POA: Diagnosis not present

## 2019-07-09 DIAGNOSIS — M6281 Muscle weakness (generalized): Secondary | ICD-10-CM | POA: Diagnosis not present

## 2019-07-09 DIAGNOSIS — N189 Chronic kidney disease, unspecified: Secondary | ICD-10-CM | POA: Diagnosis not present

## 2019-07-09 DIAGNOSIS — M109 Gout, unspecified: Secondary | ICD-10-CM | POA: Diagnosis not present

## 2019-07-10 DIAGNOSIS — N390 Urinary tract infection, site not specified: Secondary | ICD-10-CM | POA: Diagnosis not present

## 2019-07-10 DIAGNOSIS — R296 Repeated falls: Secondary | ICD-10-CM | POA: Diagnosis not present

## 2019-07-10 DIAGNOSIS — N189 Chronic kidney disease, unspecified: Secondary | ICD-10-CM | POA: Diagnosis not present

## 2019-07-10 DIAGNOSIS — I129 Hypertensive chronic kidney disease with stage 1 through stage 4 chronic kidney disease, or unspecified chronic kidney disease: Secondary | ICD-10-CM | POA: Diagnosis not present

## 2019-07-10 DIAGNOSIS — M109 Gout, unspecified: Secondary | ICD-10-CM | POA: Diagnosis not present

## 2019-07-10 DIAGNOSIS — M6281 Muscle weakness (generalized): Secondary | ICD-10-CM | POA: Diagnosis not present

## 2019-07-11 ENCOUNTER — Ambulatory Visit: Payer: Self-pay | Admitting: Internal Medicine

## 2019-07-14 DIAGNOSIS — N390 Urinary tract infection, site not specified: Secondary | ICD-10-CM | POA: Diagnosis not present

## 2019-07-14 DIAGNOSIS — R296 Repeated falls: Secondary | ICD-10-CM | POA: Diagnosis not present

## 2019-07-14 DIAGNOSIS — M109 Gout, unspecified: Secondary | ICD-10-CM | POA: Diagnosis not present

## 2019-07-14 DIAGNOSIS — M6281 Muscle weakness (generalized): Secondary | ICD-10-CM | POA: Diagnosis not present

## 2019-07-14 DIAGNOSIS — N189 Chronic kidney disease, unspecified: Secondary | ICD-10-CM | POA: Diagnosis not present

## 2019-07-14 DIAGNOSIS — I129 Hypertensive chronic kidney disease with stage 1 through stage 4 chronic kidney disease, or unspecified chronic kidney disease: Secondary | ICD-10-CM | POA: Diagnosis not present

## 2019-07-15 ENCOUNTER — Other Ambulatory Visit: Payer: Self-pay

## 2019-07-15 ENCOUNTER — Ambulatory Visit (INDEPENDENT_AMBULATORY_CARE_PROVIDER_SITE_OTHER): Payer: Medicare Other | Admitting: Podiatry

## 2019-07-15 DIAGNOSIS — B351 Tinea unguium: Secondary | ICD-10-CM

## 2019-07-15 DIAGNOSIS — M79675 Pain in left toe(s): Secondary | ICD-10-CM | POA: Diagnosis not present

## 2019-07-15 DIAGNOSIS — M79674 Pain in right toe(s): Secondary | ICD-10-CM

## 2019-07-15 NOTE — Progress Notes (Signed)
Subjective: 86 y.o. returns the office today for painful, elongated, thickened toenails which he cannot trim himself. Denies any redness or drainage around the nails.Denies any systemic complaints such as fevers, chills, nausea, vomiting.   PCP: Isaac Bliss, Rayford Halsted, MD  Objective: NAD DP/PT pulses palpable, CRT less than 3 seconds Nails hypertrophic, dystrophic, elongated, brittle, discolored 10. There is tenderness overlying the nails 1-5 bilaterally. There is no surrounding erythema or drainage along the nail sites No open lesions or pre-ulcerative lesions are identified. No other areas of tenderness bilateral lower extremities. No overlying edema, erythema, increased warmth. No pain with calf compression, swelling, warmth, erythema.  Assessment: Patient presents with symptomatic onychomycosis  Plan: -Treatment options including alternatives, risks, complications were discussed -Nails sharply debrided 10 without complication/bleeding. -Discussed daily foot inspection. If there are any changes, to call the office immediately.  -Follow-up in 3 months or sooner if any problems are to arise. In the meantime, encouraged to call the office with any questions, concerns, changes symptoms.  Celesta Gentile, DPM

## 2019-07-16 DIAGNOSIS — R296 Repeated falls: Secondary | ICD-10-CM | POA: Diagnosis not present

## 2019-07-16 DIAGNOSIS — M6281 Muscle weakness (generalized): Secondary | ICD-10-CM | POA: Diagnosis not present

## 2019-07-16 DIAGNOSIS — N189 Chronic kidney disease, unspecified: Secondary | ICD-10-CM | POA: Diagnosis not present

## 2019-07-16 DIAGNOSIS — M109 Gout, unspecified: Secondary | ICD-10-CM | POA: Diagnosis not present

## 2019-07-16 DIAGNOSIS — N390 Urinary tract infection, site not specified: Secondary | ICD-10-CM | POA: Diagnosis not present

## 2019-07-16 DIAGNOSIS — I129 Hypertensive chronic kidney disease with stage 1 through stage 4 chronic kidney disease, or unspecified chronic kidney disease: Secondary | ICD-10-CM | POA: Diagnosis not present

## 2019-07-17 DIAGNOSIS — I129 Hypertensive chronic kidney disease with stage 1 through stage 4 chronic kidney disease, or unspecified chronic kidney disease: Secondary | ICD-10-CM | POA: Diagnosis not present

## 2019-07-17 DIAGNOSIS — N189 Chronic kidney disease, unspecified: Secondary | ICD-10-CM | POA: Diagnosis not present

## 2019-07-17 DIAGNOSIS — R296 Repeated falls: Secondary | ICD-10-CM | POA: Diagnosis not present

## 2019-07-17 DIAGNOSIS — M109 Gout, unspecified: Secondary | ICD-10-CM | POA: Diagnosis not present

## 2019-07-17 DIAGNOSIS — M6281 Muscle weakness (generalized): Secondary | ICD-10-CM | POA: Diagnosis not present

## 2019-07-17 DIAGNOSIS — N390 Urinary tract infection, site not specified: Secondary | ICD-10-CM | POA: Diagnosis not present

## 2019-07-25 DIAGNOSIS — M109 Gout, unspecified: Secondary | ICD-10-CM | POA: Diagnosis not present

## 2019-07-25 DIAGNOSIS — I129 Hypertensive chronic kidney disease with stage 1 through stage 4 chronic kidney disease, or unspecified chronic kidney disease: Secondary | ICD-10-CM | POA: Diagnosis not present

## 2019-07-25 DIAGNOSIS — N390 Urinary tract infection, site not specified: Secondary | ICD-10-CM | POA: Diagnosis not present

## 2019-07-25 DIAGNOSIS — R296 Repeated falls: Secondary | ICD-10-CM | POA: Diagnosis not present

## 2019-07-25 DIAGNOSIS — N189 Chronic kidney disease, unspecified: Secondary | ICD-10-CM | POA: Diagnosis not present

## 2019-07-25 DIAGNOSIS — M6281 Muscle weakness (generalized): Secondary | ICD-10-CM | POA: Diagnosis not present

## 2019-07-31 DIAGNOSIS — M109 Gout, unspecified: Secondary | ICD-10-CM | POA: Diagnosis not present

## 2019-07-31 DIAGNOSIS — N390 Urinary tract infection, site not specified: Secondary | ICD-10-CM | POA: Diagnosis not present

## 2019-07-31 DIAGNOSIS — I129 Hypertensive chronic kidney disease with stage 1 through stage 4 chronic kidney disease, or unspecified chronic kidney disease: Secondary | ICD-10-CM | POA: Diagnosis not present

## 2019-07-31 DIAGNOSIS — M6281 Muscle weakness (generalized): Secondary | ICD-10-CM | POA: Diagnosis not present

## 2019-07-31 DIAGNOSIS — R296 Repeated falls: Secondary | ICD-10-CM | POA: Diagnosis not present

## 2019-07-31 DIAGNOSIS — N189 Chronic kidney disease, unspecified: Secondary | ICD-10-CM | POA: Diagnosis not present

## 2019-08-01 ENCOUNTER — Other Ambulatory Visit: Payer: Self-pay | Admitting: Internal Medicine

## 2019-08-01 ENCOUNTER — Ambulatory Visit (INDEPENDENT_AMBULATORY_CARE_PROVIDER_SITE_OTHER): Payer: Medicare Other | Admitting: Internal Medicine

## 2019-08-01 ENCOUNTER — Other Ambulatory Visit: Payer: Self-pay

## 2019-08-01 ENCOUNTER — Encounter: Payer: Self-pay | Admitting: Internal Medicine

## 2019-08-01 VITALS — BP 168/60 | HR 71 | Ht 64.0 in | Wt 168.0 lb

## 2019-08-01 DIAGNOSIS — R001 Bradycardia, unspecified: Secondary | ICD-10-CM | POA: Diagnosis not present

## 2019-08-01 DIAGNOSIS — E039 Hypothyroidism, unspecified: Secondary | ICD-10-CM | POA: Diagnosis not present

## 2019-08-01 DIAGNOSIS — I350 Nonrheumatic aortic (valve) stenosis: Secondary | ICD-10-CM

## 2019-08-01 MED ORDER — FUROSEMIDE 40 MG PO TABS
ORAL_TABLET | ORAL | 0 refills | Status: DC
Start: 2019-08-01 — End: 2019-09-01

## 2019-08-01 MED ORDER — LABETALOL HCL 200 MG PO TABS: 200.0000 mg | ORAL_TABLET | Freq: Two times a day (BID) | ORAL | 3 refills | Status: DC

## 2019-08-01 NOTE — Patient Instructions (Addendum)
Medication Instructions:  Your physician has recommended you make the following change in your medication:   ** Stop Amlodipine  ** Start Labetalol 200mg  - 1 tablet by mouth twice a day  **Begin Furosemide 40mg  - 1 tablet by mouth every other day x 5 doses.   *If you need a refill on your cardiac medications before your next appointment, please call your pharmacy*    Lab Work: TSH to be scheduled with echo  If you have labs (blood work) drawn today and your tests are completely normal, you will receive your results only by: Marland Kitchen MyChart Message (if you have MyChart) OR . A paper copy in the mail If you have any lab test that is abnormal or we need to change your treatment, we will call you to review the results.   Testing/Procedures: Your physician has requested that you have an echocardiogram. Echocardiography is a painless test that uses sound waves to create images of your heart. It provides your doctor with information about the size and shape of your heart and how well your heart's chambers and valves are working. This procedure takes approximately one hour. There are no restrictions for this procedure.     Follow-Up: At [facility], you and your health needs are our priority.  As part of our continuing mission to provide you with exceptional heart care, we have created designated Provider Care Teams.  These Care Teams include your primary Cardiologist (physician) and Advanced Practice Providers (APPs -  Physician Assistants and Nurse Practitioners) who all work together to provide you with the care you need, when you need it.  We recommend signing up for the patient portal called "MyChart".  Sign up information is provided on this After Visit Summary.  MyChart is used to connect with patients for Virtual Visits (Telemedicine).  Patients are able to view lab/test results, encounter notes, upcoming appointments, etc.  Non-urgent messages can be sent to your provider as well.   To  learn more about what you can do with MyChart, go to [email or web address].    Your next appointment:  09/01/2019 at 145pm with Tommye Standard PA-C

## 2019-08-01 NOTE — Progress Notes (Signed)
ELECTROPHYSIOLOGY CONSULT NOTE  Patient ID: Alexander Mcdonald, MRN: [id number], DOB/AGE: 1926/04/03 86 y.o. Admit date: (Not on file) Date of Consult: 08/01/2019  Primary Physician: Isaac Bliss, Rayford Halsted, MD Primary Cardiologist: new     Alexander Mcdonald is a 86 y.o. male who is being seen today for the evaluation of bradycardia at the request of Dr Drucilla Schmidt.    HPI Alexander Mcdonald is a 86 y.o. male referred because of bradycardia.  This was detected by infectious disease while being treated for UTI which was also associated temporally with a fall.  He does not note variability on a day-to-day basis in exercise tolerance.  He and his daughter with whom he lives both of noted a significant decline in functional status over the last 1-2 years.  He does not have orthostatic intolerance.  Does not have nocturnal dyspnea or orthopnea.  He does have significant peripheral edema which is worsened with up titration of his amlodipine.  He has been tried on low-dose diuretics with hydrochlorothiazide which was stopped in the context of his UTI.  Denies lightheadedness or syncope.  DATE TEST EF   12/13 Echo   55-60 % Mod LVH        Date Cr K TSH Hgb  5/21 2.36 3.7 pending 15.3                Past Medical History:  Diagnosis Date  . ACUTE RENAL FAILURE W/LESION OF TUBULAR NECROSIS 01/04/2007  . Bladder spasm 06/04/2014  . BPH (benign prostatic hyperplasia) 06/04/2014  . Bradycardia 06/04/2019  . CAROTID ARTERY DISEASE 06/01/2009  . DEPRESSION 07/25/2006  . DIVERTICULOSIS OF COLON 02/01/2007  . Glaucoma   . GOUT 07/25/2006  . HEMORRHOID, THROMBOSED 02/15/2009  . HYPERCHOLESTEROLEMIA 07/25/2006  . HYPERTENSION 07/25/2006  . PROSTATE CANCER, HX OF 07/25/2006  . Rosacea 09/28/2008  . Unsteadiness 06/04/2019  . WEIGHT LOSS 09/01/2009      Surgical History:  Past Surgical History:  Procedure Laterality Date  . BIOPSY  07/11/2017   Procedure: BIOPSY;  Surgeon: Gatha Mayer, MD;  Location:  [facility];  Service: Endoscopy;;  . BRONCHIAL BRUSHINGS  07/11/2017   Procedure: ESOPHAGEAL BRUSHINGS;  Surgeon: Gatha Mayer, MD;  Location: [facility];  Service: Endoscopy;;  . CAROTID ENDARTERECTOMY    . CATARACT EXTRACTION    . ESOPHAGOGASTRODUODENOSCOPY (EGD) WITH PROPOFOL N/A 07/11/2017   Procedure: ESOPHAGOGASTRODUODENOSCOPY (EGD) WITH PROPOFOL;  Surgeon: Gatha Mayer, MD;  Location: [facility];  Service: Endoscopy;  Laterality: N/A;     Home Meds: Current Meds  Medication Sig  . acetaminophen (TYLENOL) 325 MG tablet Take 2 tablets (650 mg total) by mouth every 6 (six) hours as needed for mild pain (or Fever >/= 101).  Marland Kitchen amLODipine (NORVASC) 10 MG tablet Take 1 tablet (10 mg total) by mouth daily.  Marland Kitchen apraclonidine (IOPIDINE) 0.5 % ophthalmic solution Place 1 drop into the left eye in the morning and at bedtime.   . Ascorbic Acid (VITAMIN C) 1000 MG tablet Take 1,000 mg by mouth daily.  Marland Kitchen aspirin EC 81 MG tablet Take 1 tablet (81 mg total) by mouth daily.  Marland Kitchen atorvastatin (LIPITOR) 10 MG tablet TAKE 1 TABLET(10 MG) BY MOUTH DAILY (Patient taking differently: Take 10 mg by mouth daily. )  . brinzolamide (AZOPT) 1 % ophthalmic suspension Place 1 drop into the left eye in the morning and at bedtime.  . cholecalciferol (VITAMIN D3) 25 MCG (1000 UNIT) tablet Take 1,000  Units by mouth daily.  . fish oil-omega-3 fatty acids 1000 MG capsule Take 1 g by mouth 2 (two) times daily.   Marland Kitchen latanoprost (XALATAN) 0.005 % ophthalmic solution Place 1 drop into the left eye at bedtime.  Marland Kitchen levocetirizine (XYZAL) 5 MG tablet Take 1 tablet (5 mg total) by mouth every evening.  Marland Kitchen levothyroxine (SYNTHROID) 50 MCG tablet TAKE 1 TABLET BY MOUTH DAILY (Patient taking differently: Take 50 mcg by mouth daily. )  . Multiple Vitamin (MULTIVITAMIN WITH MINERALS) TABS tablet Take 1 tablet by mouth daily.  . sodium chloride (MURO 128) 2 % ophthalmic solution Place 1 drop into the right eye daily.  Marland Kitchen zinc  gluconate 50 MG tablet Take 50 mg by mouth daily.    Allergies:  Allergies  Allergen Reactions  . Shellfish Allergy Anaphylaxis    Pt is allergic to mussels   . Brimonidine Itching    Itching and swelling red  . Brinzolamide-Brimonidine Itching  . Dorzolamide Hcl-Timolol Mal Other (See Comments)    Eye itch and redness  . Timolol Itching    Itching rash under eyes.    Social History   Socioeconomic History  . Marital status: Married    Spouse name: Not on file  . Number of children: 3  . Years of education: Not on file  . Highest education level: Not on file  Occupational History  . Not on file  Tobacco Use  . Smoking status: Former Smoker    Quit date: 01/17/1988    Years since quitting: 31.5  . Smokeless tobacco: Never Used  Vaping Use  . Vaping Use: Never used  Substance and Sexual Activity  . Alcohol use: Yes    Alcohol/week: 7.0 standard drinks    Types: 7 drink(s) per week    Comment: beer  . Drug use: No  . Sexual activity: Never  Other Topics Concern  . Not on file  Social History Narrative  . Not on file   Social Determinants of Health   Financial Resource Strain:   . Difficulty of Paying Living Expenses:   Food Insecurity:   . Worried About Charity fundraiser in the Last Year:   . Arboriculturist in the Last Year:   Transportation Needs:   . Film/video editor (Medical):   Marland Kitchen Lack of Transportation (Non-Medical):   Physical Activity:   . Days of Exercise per Week:   . Minutes of Exercise per Session:   Stress:   . Feeling of Stress :   Social Connections:   . Frequency of Communication with Friends and Family:   . Frequency of Social Gatherings with Friends and Family:   . Attends Religious Services:   . Active Member of Clubs or Organizations:   . Attends Archivist Meetings:   Marland Kitchen Marital Status:   Intimate Partner Violence:   . Fear of Current or Ex-Partner:   . Emotionally Abused:   Marland Kitchen Physically Abused:   . Sexually  Abused:      History reviewed. No pertinent family history.   ROS:  Please see the history of present illness.     All other systems reviewed and negative.    Physical Exam:  Blood pressure (!) 168/60, pulse 71, height 5\' 4"  (1.626 m), weight 168 lb (76.2 kg), SpO2 96 %. General: Well developed, well nourished male in no acute distress. Head: Normocephalic, atraumatic, sclera non-icteric, no xanthomas, nares are without discharge. EENT: normal  Lymph Nodes:  none  Neck: Negative for carotid bruits carotid somewhat delayed. JVD not elevated. Back:without scoliosis kyphosis Lungs: Clear bilaterally to auscultation without wheezes, rales, or rhonchi. Breathing is unlabored. Heart: Irregular rate and rhythm in a pattern of intermittent PVCs with a 3/6 systolic murmur . No rubs, or gallops appreciated. Abdomen: Soft, non-tender, non-distended with normoactive bowel sounds. No hepatomegaly. No rebound/guarding. No obvious abdominal masses. Msk:  Strength and tone appear normal for age. Extremities: No clubbing or cyanosis. tr edema.  Distal pedal pulses are 2+ and equal bilaterally. Skin: Warm and Dry Neuro: Alert and oriented X 3. CN III-XII intact Grossly normal sensory and motor function . Psych:  Responds to questions appropriately with a normal affect.      Labs: Cardiac Enzymes No results for input(s): CKTOTAL, CKMB, TROPONINI in the last 72 hours. CBC Lab Results  Component Value Date   WBC 10.4 06/02/2019   HGB 15.3 06/02/2019   HCT 44.4 06/02/2019   MCV 89.2 06/02/2019   PLT 207 06/02/2019   PROTIME: No results for input(s): LABPROT, INR in the last 72 hours. Chemistry No results for input(s): NA, K, CL, CO2, BUN, CREATININE, CALCIUM, PROT, BILITOT, ALKPHOS, ALT, AST, GLUCOSE in the last 168 hours.  Invalid input(s): LABALBU Lipids Lab Results  Component Value Date   CHOL 193 01/02/2019   HDL 51.70 01/02/2019   LDLCALC 100 (H) 11/30/2016   TRIG 231.0 (H) 01/02/2019    BNP No results found for: PROBNP Thyroid Function Tests: No results for input(s): TSH, T4TOTAL, T3FREE, THYROIDAB in the last 72 hours.  Invalid input(s): FREET3 Miscellaneous No results found for: DDIMER  Radiology/Studies:  No results found.  EKG: Sinus at 71 Intervals 19/08/40 PVCs with a left bundle inferior axis.  ECG yes5/17/21 \\interval  18/08/38 with PVCs in a pattern of bigeminy with a left bundle inferior axis morphology and transition V3-V4   Assessment and Plan:  PVCs/functional bradycardia  Hypertension  Peripheral edema  Renal insufficiency grade 4  Hypothyroidism-treated  Aortic stenosis  The patient has poorly controlled hypertension in the context of renal insufficiency.  Amlodipine has been used as the primary therapy of late and this is been concurrent with his worsening peripheral edema.  He has no evidence of JVD; his lungs were clear.  I wonder to what degree the amlodipine is responsible for the edema.  I will discontinue it.  In its place we will try labetalol as his renal insufficiency limits drug choices.  Hydralazine can also be associated with fluid retention otherwise it would be a good choice.  We will try also to diurese him gently just for a few doses.  We have elected to do it at night while he wears his depends does have to avoid frequent urination.  His PVCs are contributing to a functional bradycardia.  The impact can be real.  Treatment options are not great.  Hopefully the beta-blocker will be of some benefit.  In the event that it does not, would probably monitor him prior to deciding about the use of an antiarrhythmic drug.  We will undertake an echo to assess aortic stenosis.  This may also be contributing to his functional decompensation.  He was noted to be hypothyroid 12/20.  Repeat TSH is not available.  We will check it.      Virl Axe

## 2019-08-04 NOTE — Telephone Encounter (Signed)
Pt's pharmacy is requesting a refill on furosemide. Would Dr. Caryl Comes like to refill this medication? Please address

## 2019-08-13 DIAGNOSIS — H401123 Primary open-angle glaucoma, left eye, severe stage: Secondary | ICD-10-CM | POA: Diagnosis not present

## 2019-08-13 DIAGNOSIS — H44511 Absolute glaucoma, right eye: Secondary | ICD-10-CM | POA: Diagnosis not present

## 2019-08-21 DIAGNOSIS — H348112 Central retinal vein occlusion, right eye, stable: Secondary | ICD-10-CM | POA: Diagnosis not present

## 2019-08-21 DIAGNOSIS — H353221 Exudative age-related macular degeneration, left eye, with active choroidal neovascularization: Secondary | ICD-10-CM | POA: Diagnosis not present

## 2019-08-26 ENCOUNTER — Other Ambulatory Visit: Payer: Self-pay | Admitting: Internal Medicine

## 2019-08-27 ENCOUNTER — Ambulatory Visit (INDEPENDENT_AMBULATORY_CARE_PROVIDER_SITE_OTHER): Payer: Medicare Other | Admitting: Infectious Disease

## 2019-08-27 ENCOUNTER — Encounter: Payer: Self-pay | Admitting: Infectious Disease

## 2019-08-27 ENCOUNTER — Other Ambulatory Visit: Payer: Self-pay

## 2019-08-27 VITALS — BP 136/64 | HR 50 | Temp 97.6°F | Wt 158.0 lb

## 2019-08-27 DIAGNOSIS — K2210 Ulcer of esophagus without bleeding: Secondary | ICD-10-CM | POA: Diagnosis not present

## 2019-08-27 DIAGNOSIS — I493 Ventricular premature depolarization: Secondary | ICD-10-CM

## 2019-08-27 DIAGNOSIS — R001 Bradycardia, unspecified: Secondary | ICD-10-CM | POA: Diagnosis not present

## 2019-08-27 DIAGNOSIS — R112 Nausea with vomiting, unspecified: Secondary | ICD-10-CM | POA: Diagnosis not present

## 2019-08-27 DIAGNOSIS — I350 Nonrheumatic aortic (valve) stenosis: Secondary | ICD-10-CM | POA: Diagnosis not present

## 2019-08-27 HISTORY — DX: Nonrheumatic aortic (valve) stenosis: I35.0

## 2019-08-27 HISTORY — DX: Ventricular premature depolarization: I49.3

## 2019-08-27 NOTE — Progress Notes (Signed)
Chief complaint: Having some trouble with nausea and diarrhea Subjective:    Patient ID: Alexander Mcdonald, male    DOB: March 13, 1926, 86 y.o.   MRN: [id number]  HPI  86 year old man with hx significant for CKD, gout, prostate cancer who has had recurrent symptoms of dysuria, polyuria x 3 years when I last saw him which was in 2016.  He has been [facility] by Steele Berg and Kathie Rhodes. He previously would have resolution of his symptoms with use of antibiotic and antispasmodic but now continues to have recurrence of ssx shortly after stopping both antibiotic and antispasmodic.   He had repeatedly grown Pseudomonas Aeruginosa from his urine,  on 03/30/13 when he grew 75K R to cipro/levo, I to [place] but S to zosyn, IMI, Ceftaz and Cefepime.   He had continued to receive Cefdinir for this despite the fact that this cephalosporin has ZERO ANTI-PSEUDOMONAL activity.   IN talking to me it was not clear then  that he actually was suffering from genuine UTI's though he did have ssx of dysuria, polyruria and nausea occasionally with vomiting and even excessive sleeping.  He had no fevers, chills, weight loss or syncope or light headedness at that time.     I saw him clinic for first time in March 2015 and did UA which was with 0-2 WBC. Urine culture only yielded 55k Pseudomonas Aeruginosa and he had in fact improved in the interim.  He has had one epidsode of dysuria this am early but this has subsequently cleared.  I saw him a week later and he was doing fine and I encouraged use of antispasmodics and avoidance of antibiotics. SInce then he has been seen by PCP who has given him rx for [facility] and he has had DRAMATIC improvement in urinary spasm ssx. He had repeat UA and urine culture that was completely sterile. He had not resumed any abx. He has no systemic ssx.  At one point he had  stopped the [facility] and shortly after stopping it began having ssx of dysuria, increasing frequency  but apparently also less appetite though no fevers. He was seen by Urology where UA did not show WBC per daughter but Urine culture grew an organism. He took the fosfomycin and then had more symptoms a few weeks later and was again treated with fosfomycin. He had  minimal ssx with MYRBETRIQ when last seen.  In mid April 2021 he had NEW worsening of symptoms beyond what he typically has with frequency urgency, dysuria beyond what he typically experiences.   His daughter gave him a dose of fosfomycin with improvement of his symptoms. He also began taking "URIBEL" and symptoms resolved after taking 2 doses of fosfomycin and with the uribel. Then roughly week after stopping these he had recurrence of dysuria and a urine sample with analysis and culture was dropped off in our clinicUrinalysis did show copious white blood cells and culture eventually grew pseudomonas aeruginosa resistant to levofloxacin and ciprofloxacin but sensitive to other antipseudomonal agents. He had started back on Uribel and I also in the interim (not knowing he had taken fosfomycin already and (recommended him start fosfomycin again.   In the interim I did continue to be able to avoid giving him antibiotics and according to daughter who accompanied him he had not been on antibiotics until this year in December 2020.  He was at that time seeing Dr. Jerilee Hoh who took a urine analysis and culture.  UA showed 2+  leukocytes but culture was actually sterile at the time.  In the interim he has had worsening fatigue.  Daughter reports that he had had his coronavirus vaccines and that some of the fatigue came on afterwards.  In the interim he was found to have an elevated PSA and is been evaluated by [facility] again.  He was also having more problems with nocturia and polyuria.  Of note he had also been prescribed thiazide diuretic.  Recently he is also had pain with urination and was seen by [facility] who performed  cystoscopic which was sent for culture and yielded Pseudomonas aeruginosa that was sensitive to most antibiotics available to treat Pseudomonas including ceftazidime cefepime imipenem meropenem ciprofloxacin and gentamicin.  He was prescribed Monurol by the urologist and referred back to [place].  His daughter actually had Monurol at home from several years ago and actually initiated this.  Unfortunately he did not have improvement even after 2 doses of the older Monurol.  She then began the newer prescription.  Unfortunately interim he had progressive confusion on Sunday and pain to the emergency department.  He had also one point in time had his knees buckled beneath him and gone down to the ground.  Lab work and CT scans were done in the ER including of the head.  His urine was not analyzed or sent for culture nor were blood cultures done.  He did have an EKG which showed some bigeminy.   He continued have confusion despite further antimicrobial therapy.  It was not clear to me that this is again a urinary tract infection or not but certainly the infection and progress beyond the level of the bladder fosfomycin would not be sufficient to treat it  I saw him in clinic a few weeks ago and blood cultures and try to obtain urine cultures.  Could not produce urine for culture here but did so and then the culture was apparently sent to [facility] urology where showed absolutely no growth.  In the interim the patient's daughter had given him the remainder of his fosfomycin and given him the ciprofloxacin that I prescribed.  She thinks that he perhaps did improve a little bit and he was able to walk to the bathroom.  However he continued to have troubles with confusion in particular when his heart rate drops into the 40s.  Apparently when he is doing physical therapy and stimulate his heart rate will be more in the 70s and will be more alert.  I referred him to Dr. Caryl Comes with electrophysiology.  Dr. Caryl Comes  found the patient does have significant aortic stenosis and PVCs.  He discovered the patient's edema was really due to amlodipine which is resolved now with amlodipine being discontinued he is started labetalol and has a transthoracic echocardiogram planned.  I think that his functional bradycardia is clearly what is causing his dizziness.  He was also gently diuresed recently.  Echocardiogram is coming up.  He did eat at a diner recently and afterwards had nausea vomiting and some loose stools for several days which resolved with Imodium.  He then had return of nausea when he ate [place] the other day and has returned to a bland diet.     Review of Systems  Unable to perform ROS: Mental status change  Constitutional: Negative for activity change, appetite change, diaphoresis, fever and unexpected weight change.  HENT: Negative for congestion.   Eyes: Negative for photophobia.  Respiratory: Negative for cough, chest tightness, wheezing and  stridor.   Cardiovascular: Negative for chest pain.  Gastrointestinal: Negative for abdominal distention, abdominal pain, constipation and diarrhea.  Genitourinary: Negative for dysuria.  Musculoskeletal: Negative for arthralgias and back pain.  Skin: Negative for color change and pallor.  Neurological: Negative for tremors.  Psychiatric/Behavioral: Negative for dysphoric mood and sleep disturbance.       Objective:   Physical Exam Vitals and nursing note reviewed.  Constitutional:      General: He is not in acute distress.    Appearance: He is not diaphoretic.  HENT:     Head: Normocephalic and atraumatic.     Mouth/Throat:     Pharynx: No oropharyngeal exudate.  Eyes:     General: No scleral icterus.    Conjunctiva/sclera: Conjunctivae normal.  Neck:     Vascular: No JVD.  Cardiovascular:     Rate and Rhythm: Normal rate and regular rhythm.     Heart sounds: Normal heart sounds. No murmur heard.  No friction rub. No gallop.     Pulmonary:     Effort: Pulmonary effort is normal. No respiratory distress.     Breath sounds: Normal breath sounds. No wheezing.  Abdominal:     General: Bowel sounds are normal. There is no distension.     Palpations: There is no mass.     Tenderness: There is no abdominal tenderness. There is no rebound.     Hernia: No hernia is present.  Musculoskeletal:        General: No tenderness.     Cervical back: Normal range of motion and neck supple.  Lymphadenopathy:     Cervical: No cervical adenopathy.  Skin:    General: Skin is warm and dry.     Coloration: Skin is not pale.     Findings: No erythema.  Neurological:     Mental Status: He is alert.     Motor: No abnormal muscle tone.     Coordination: Coordination normal.  Psychiatric:        Behavior: Behavior is slowed.        Cognition and Memory: Memory is impaired. He exhibits impaired recent memory and impaired remote memory.           Assessment & Plan:   Nausea and vomiting with loose stools: He has multiple GI diagnoses in the chart not sure if 1 of these is manifesting itself again.  I will check CMP to check his liver function test as well as a lipase and amylase he has a fairly benign abdominal exam today.  For further work-up to primary care  Confusion, dizziness:   Since May to be due to his cardiac problems    Chronic cystitis: at times urinary tract infections --continue his antispasmodics --continue to AVOID unnecessary antibiotics and avoid over-treating him with abx, the longer without abx the BETTER -   Prostate Cancer: followed by Urology PSA is up my understand they are not planning any interventions.

## 2019-08-28 LAB — COMPLETE METABOLIC PANEL WITH GFR
AG Ratio: 1.7 (calc) (ref 1.0–2.5)
ALT: 18 U/L (ref 9–46)
AST: 16 U/L (ref 10–35)
Albumin: 4.1 g/dL (ref 3.6–5.1)
Alkaline phosphatase (APISO): 97 U/L (ref 35–144)
BUN/Creatinine Ratio: 15 (calc) (ref 6–22)
BUN: 36 mg/dL — ABNORMAL HIGH (ref 7–25)
CO2: 22 mmol/L (ref 20–32)
Calcium: 9.4 mg/dL (ref 8.6–10.3)
Chloride: 108 mmol/L (ref 98–110)
Creat: 2.46 mg/dL — ABNORMAL HIGH (ref 0.70–1.11)
GFR, Est African American: 26 mL/min/{1.73_m2} — ABNORMAL LOW (ref >=60)
GFR, Est Non African American: 22 mL/min/{1.73_m2} — ABNORMAL LOW (ref >=60)
Globulin: 2.4 g/dL (calc) (ref 1.9–3.7)
Glucose, Bld: 95 mg/dL (ref 65–99)
Potassium: 4.2 mmol/L (ref 3.5–5.3)
Sodium: 140 mmol/L (ref 135–146)
Total Bilirubin: 0.9 mg/dL (ref 0.2–1.2)
Total Protein: 6.5 g/dL (ref 6.1–8.1)

## 2019-08-28 LAB — CBC WITH DIFFERENTIAL/PLATELET
Absolute Monocytes: 621 cells/uL (ref 200–950)
Basophils Absolute: 58 cells/uL (ref 0–200)
Basophils Relative: 0.8 %
Eosinophils Absolute: 299 cells/uL (ref 15–500)
Eosinophils Relative: 4.1 %
HCT: 41.4 % (ref 38.5–50.0)
Hemoglobin: 14 g/dL (ref 13.2–17.1)
Lymphs Abs: 1540 cells/uL (ref 850–3900)
MCH: 31 pg (ref 27.0–33.0)
MCHC: 33.8 g/dL (ref 32.0–36.0)
MCV: 91.8 fL (ref 80.0–100.0)
MPV: 10.1 fL (ref 7.5–12.5)
Monocytes Relative: 8.5 %
Neutro Abs: 4782 cells/uL (ref 1500–7800)
Neutrophils Relative %: 65.5 %
Platelets: 172 10*3/uL (ref 140–400)
RBC: 4.51 10*6/uL (ref 4.20–5.80)
RDW: 13.7 % (ref 11.0–15.0)
Total Lymphocyte: 21.1 %
WBC: 7.3 10*3/uL (ref 3.8–10.8)

## 2019-08-28 LAB — LIPASE: Lipase: 30 U/L (ref 7–60)

## 2019-08-28 LAB — AMYLASE: Amylase: 60 U/L (ref 21–101)

## 2019-08-31 NOTE — Progress Notes (Signed)
Cardiology Office Note Date:  09/01/2019  Patient ID:  Alexander Mcdonald, Wronski 1926-09-18, MRN [id number] PCP:  Isaac Bliss, Rayford Halsted, MD  Electrophysiologist:  Dr. Caryl Comes    Chief Complaint:  planned follow up  History of Present Illness: Alexander Mcdonald is a 86 y.o. male with history of BPH, HTN, HLD, CKD, chronic cystitis with recurrent UTIs, prostate cancer  He comes in today to be seen for Dr. Caryl Comes, last seen by him 08/01/19 at his initial consultation with him for bradycardia noted by the pt's ID MD who was seeing him for UTI. The pt lives with his daughter, both noting a functional decline over a couple years.  He was found to have PVCs creating functional bradycardia He was edematous without JVD or symptoms of volume OL, his amlodipine stopped > started on labetalol given limited meds 2/2 renal disease (noting hydralazine can also cause edema).  Mentioned if PVCs not improved on BB would plan monitoring prior to AAD initiation.  Planned for echo (to follow up on AS) and TSH and follow up. I do not see results for either.  He saw ID 08/27/19 with GI complaints, N/V, diarrhea   TODAY He is accompanied by his daughter who he lives with. His GI symptoms started after eating something unusual for him at a restaurant, improved with bland diet, but not quite back to normal.  His daughter states  "he had to have coffee and that started things back up a little" No CP, no palpitations He remains generally fatigued, tired. His edema completely resolved off the amlodipine    Past Medical History:  Diagnosis Date  . ACUTE RENAL FAILURE W/LESION OF TUBULAR NECROSIS 01/04/2007  . Aortic stenosis 08/27/2019  . Bladder spasm 06/04/2014  . BPH (benign prostatic hyperplasia) 06/04/2014  . Bradycardia 06/04/2019  . CAROTID ARTERY DISEASE 06/01/2009  . DEPRESSION 07/25/2006  . DIVERTICULOSIS OF COLON 02/01/2007  . Glaucoma   . GOUT 07/25/2006  . HEMORRHOID, THROMBOSED 02/15/2009  .  HYPERCHOLESTEROLEMIA 07/25/2006  . HYPERTENSION 07/25/2006  . PROSTATE CANCER, HX OF 07/25/2006  . PVC (premature ventricular contraction) 08/27/2019  . Rosacea 09/28/2008  . Unsteadiness 06/04/2019  . WEIGHT LOSS 09/01/2009    Past Surgical History:  Procedure Laterality Date  . BIOPSY  07/11/2017   Procedure: BIOPSY;  Surgeon: Gatha Mayer, MD;  Location: [facility];  Service: Endoscopy;;  . BRONCHIAL BRUSHINGS  07/11/2017   Procedure: ESOPHAGEAL BRUSHINGS;  Surgeon: Gatha Mayer, MD;  Location: [facility];  Service: Endoscopy;;  . CAROTID ENDARTERECTOMY    . CATARACT EXTRACTION    . ESOPHAGOGASTRODUODENOSCOPY (EGD) WITH PROPOFOL N/A 07/11/2017   Procedure: ESOPHAGOGASTRODUODENOSCOPY (EGD) WITH PROPOFOL;  Surgeon: Gatha Mayer, MD;  Location: [facility];  Service: Endoscopy;  Laterality: N/A;    Current Outpatient Medications  Medication Sig Dispense Refill  . acetaminophen (TYLENOL) 325 MG tablet Take 2 tablets (650 mg total) by mouth every 6 (six) hours as needed for mild pain (or Fever >/= 101).    Marland Kitchen apraclonidine (IOPIDINE) 0.5 % ophthalmic solution Place 1 drop into the left eye in the morning and at bedtime.     . Ascorbic Acid (VITAMIN C) 1000 MG tablet Take 1,000 mg by mouth daily.    Marland Kitchen aspirin EC 81 MG tablet Take 1 tablet (81 mg total) by mouth daily.    Marland Kitchen atorvastatin (LIPITOR) 10 MG tablet TAKE 1 TABLET(10 MG) BY MOUTH DAILY 90 tablet 1  . brinzolamide (AZOPT) 1 % ophthalmic  suspension Place 1 drop into the left eye in the morning and at bedtime.    . cholecalciferol (VITAMIN D3) 25 MCG (1000 UNIT) tablet Take 1,000 Units by mouth daily.    . fish oil-omega-3 fatty acids 1000 MG capsule Take 1 g by mouth 2 (two) times daily.     Marland Kitchen labetalol (NORMODYNE) 200 MG tablet Take 1 tablet (200 mg total) by mouth 2 (two) times daily. 180 tablet 3  . latanoprost (XALATAN) 0.005 % ophthalmic solution Place 1 drop into the left eye at bedtime.    Marland Kitchen levocetirizine (XYZAL) 5 MG  tablet Take 1 tablet (5 mg total) by mouth every evening. 90 tablet 0  . levothyroxine (SYNTHROID) 50 MCG tablet TAKE 1 TABLET BY MOUTH DAILY 30 tablet 0  . Multiple Vitamin (MULTIVITAMIN WITH MINERALS) TABS tablet Take 1 tablet by mouth daily. 30 tablet 0  . sodium chloride (MURO 128) 2 % ophthalmic solution Place 1 drop into the right eye daily.    Marland Kitchen zinc gluconate 50 MG tablet Take 50 mg by mouth daily.     No current facility-administered medications for this visit.    Allergies:   Shellfish allergy, Brimonidine, Brinzolamide-brimonidine, Dorzolamide hcl-timolol mal, and Timolol   Social History:  The patient  reports that he quit smoking about 31 years ago. He has never used smokeless tobacco. He reports current alcohol use of about 7.0 standard drinks of alcohol per week. He reports that he does not use drugs.   Family History:  The patient's family history includes Hypertension in his father; Leukemia in his sister.  ROS:  Please see the history of present illness. All other systems are reviewed and otherwise negative.   PHYSICAL EXAM:  VS:  BP (!) 142/58   Pulse 71   Ht 5\' 4"  (1.626 m)   Wt 158 lb (71.7 kg)   SpO2 95%   BMI 27.12 kg/m  BMI: Body mass index is 27.12 kg/m. Well nourished, well developed, in no acute distress  HEENT: normocephalic, atraumatic  Neck: no JVD, carotid bruits or masses Cardiac: RRR; bradycardic, 2/6 SM, no rubs, or gallops Lungs:  CTA b/l, no wheezing, rhonchi or rales  Abd: soft, nontender MS: no deformity, age appropriate atrophy Ext: no edema  Skin: warm and dry, no rash Neuro:  No gross deficits appreciated Psych: euthymic mood, full affect    EKG:  Done today and reviewed by myself shows  SB 45bpm, no PVCs   12/28/2011: TTE Study Conclusions  - Left ventricle: The cavity size was normal. Wall thickness  was increased in a pattern of moderate LVH. Systolic  function was normal. The estimated ejection fraction was  in the  range of 55% to 60%. Wall motion was normal; there  were no regional wall motion abnormalities.  - Aortic valve: Mild to moderate regurgitation. Valve area:  1.74cm^2(VTI). Valve area: 1.55cm^2 (Vmax).  - Atrial septum: No defect or patent foramen ovale was  identified.  - Pulmonic valve: Peak gradient: 13mm Hg (S).    Recent Labs: 01/02/2019: TSH 5.02 08/27/2019: ALT 18; BUN 36; Creat 2.46; Hemoglobin 14.0; Platelets 172; Potassium 4.2; Sodium 140  01/02/2019: Cholesterol 193; Direct LDL 104.0; HDL 51.70; Total CHOL/HDL Ratio 4; Triglycerides 231.0; VLDL 46.2   Estimated Creatinine Clearance: 17.8 mL/min (A) (by C-G formula based on SCr of 2.46 mg/dL (H)).   Wt Readings from Last 3 Encounters:  09/01/19 158 lb (71.7 kg)  08/27/19 158 lb (71.7 kg)  08/01/19 168 lb (76.2  kg)     Other studies reviewed: Additional studies/records reviewed today include: summarized above  ASSESSMENT AND PLAN:  1. PVCs w/functional bradycardia     None today on EKG or noted with prolonged auscultation     Subsequent true bradycardia  2. HTN     Described as "perfect" at home  3. AS by old echo and exam     Echo is done, pending read   Will reduce his labetalol to 100mg  BID, none tonight, to start lowered dose tomorrow. I think we could consider hydralazine low dose if needed for BP His edema completely resolved off amlodipine   Disposition: F/u with TSH today, his echo was done today, pending read.  I will see him back in 2 weeks or so, sooner if needed.  Current medicines are reviewed at length with the patient today.  The patient did not have any concerns regarding medicines.  Venetia Night, PA-C 09/01/2019 2:22 PM     [address] [address] [phone] (office)  [phone] (fax)

## 2019-09-01 ENCOUNTER — Ambulatory Visit (HOSPITAL_COMMUNITY): Payer: Medicare Other | Attending: Internal Medicine

## 2019-09-01 ENCOUNTER — Other Ambulatory Visit: Payer: Self-pay

## 2019-09-01 ENCOUNTER — Ambulatory Visit (INDEPENDENT_AMBULATORY_CARE_PROVIDER_SITE_OTHER): Payer: Medicare Other | Admitting: Physician Assistant

## 2019-09-01 ENCOUNTER — Ambulatory Visit: Payer: Medicare Other | Admitting: Infectious Disease

## 2019-09-01 ENCOUNTER — Encounter: Payer: Self-pay | Admitting: Physician Assistant

## 2019-09-01 ENCOUNTER — Other Ambulatory Visit: Payer: Medicare Other | Admitting: *Deleted

## 2019-09-01 VITALS — BP 142/58 | HR 71 | Ht 64.0 in | Wt 158.0 lb

## 2019-09-01 DIAGNOSIS — I350 Nonrheumatic aortic (valve) stenosis: Secondary | ICD-10-CM

## 2019-09-01 DIAGNOSIS — Z79899 Other long term (current) drug therapy: Secondary | ICD-10-CM

## 2019-09-01 DIAGNOSIS — I493 Ventricular premature depolarization: Secondary | ICD-10-CM

## 2019-09-01 DIAGNOSIS — R001 Bradycardia, unspecified: Secondary | ICD-10-CM | POA: Diagnosis not present

## 2019-09-01 DIAGNOSIS — I10 Essential (primary) hypertension: Secondary | ICD-10-CM

## 2019-09-01 DIAGNOSIS — E039 Hypothyroidism, unspecified: Secondary | ICD-10-CM

## 2019-09-01 LAB — ECHOCARDIOGRAM COMPLETE
AR max vel: 0.8 cm2
AV Area VTI: 0.79 cm2
AV Area mean vel: 0.76 cm2
AV Mean grad: 29 mmHg
AV Peak grad: 45.2 mmHg
Ao pk vel: 3.36 m/s
Area-P 1/2: 3.06 cm2
P 1/2 time: 527 msec
S' Lateral: 2.9 cm

## 2019-09-01 MED ORDER — LABETALOL HCL 100 MG PO TABS: 100.0000 mg | ORAL_TABLET | Freq: Two times a day (BID) | ORAL | 1 refills | Status: DC

## 2019-09-01 MED ORDER — FUROSEMIDE 20 MG PO TABS: ORAL_TABLET | ORAL | 0 refills | Status: DC

## 2019-09-01 NOTE — Patient Instructions (Addendum)
  Medication Instructions:   START TAKING LABETALOL 100 MG TWICE A DAY  START TOMORROW   *If you need a refill on your cardiac medications before your next appointment, please call your pharmacy*   Lab Work:  NONE ORDERED  TODAY   If you have labs (blood work) drawn today and your tests are completely normal, you will receive your results only by: Marland Kitchen MyChart Message (if you have MyChart) OR . A paper copy in the mail If you have any lab test that is abnormal or we need to change your treatment, we will call you to review the results.   Testing/Procedures: NONE ORDERED  TODAY   Follow-Up: At [facility], you and your health needs are our priority.  As part of our continuing mission to provide you with exceptional heart care, we have created designated Provider Care Teams.  These Care Teams include your primary Cardiologist (physician) and Advanced Practice Providers (APPs -  Physician Assistants and Nurse Practitioners) who all work together to provide you with the care you need, when you need it.  We recommend signing up for the patient portal called "MyChart".  Sign up information is provided on this After Visit Summary.  MyChart is used to connect with patients for Virtual Visits (Telemedicine).  Patients are able to view lab/test results, encounter notes, upcoming appointments, etc.  Non-urgent messages can be sent to your provider as well.   To learn more about what you can do with MyChart, go to [email or web address].    Your next appointment:   2-3  week(s)  The format for your next appointment:   In Person  Provider:   You may see  Dr. Caryl Comes  or one of the following Advanced Practice Providers on your designated Care Team:    Chanetta Marshall, NP  Tommye Standard, PA-C  Legrand Como "Oda Kilts, [place]    Other Instructions

## 2019-09-02 LAB — TSH: TSH: 3.92 u[IU]/mL (ref 0.450–4.500)

## 2019-09-03 NOTE — Addendum Note (Signed)
Addended by: Claude Manges on: 09/03/2019 07:57 AM   Modules accepted: Orders

## 2019-09-04 DIAGNOSIS — E039 Hypothyroidism, unspecified: Secondary | ICD-10-CM | POA: Diagnosis not present

## 2019-09-04 DIAGNOSIS — Z7189 Other specified counseling: Secondary | ICD-10-CM | POA: Diagnosis not present

## 2019-09-26 ENCOUNTER — Ambulatory Visit: Payer: Medicare Other | Admitting: Physician Assistant

## 2019-09-30 ENCOUNTER — Other Ambulatory Visit: Payer: Self-pay | Admitting: Internal Medicine

## 2019-10-06 ENCOUNTER — Ambulatory Visit: Payer: Medicare Other | Admitting: Physician Assistant

## 2019-10-06 DIAGNOSIS — R21 Rash and other nonspecific skin eruption: Secondary | ICD-10-CM | POA: Diagnosis not present

## 2019-10-06 DIAGNOSIS — B029 Zoster without complications: Secondary | ICD-10-CM | POA: Diagnosis not present

## 2019-10-16 ENCOUNTER — Ambulatory Visit (INDEPENDENT_AMBULATORY_CARE_PROVIDER_SITE_OTHER): Payer: Medicare Other | Admitting: Podiatry

## 2019-10-16 ENCOUNTER — Other Ambulatory Visit: Payer: Self-pay

## 2019-10-16 DIAGNOSIS — B351 Tinea unguium: Secondary | ICD-10-CM | POA: Diagnosis not present

## 2019-10-16 DIAGNOSIS — M79675 Pain in left toe(s): Secondary | ICD-10-CM | POA: Diagnosis not present

## 2019-10-16 DIAGNOSIS — M79674 Pain in right toe(s): Secondary | ICD-10-CM | POA: Diagnosis not present

## 2019-10-16 NOTE — Progress Notes (Signed)
Subjective: 86 y.o. returns the office today for painful, elongated, thickened toenails which he cannot trim himself. Denies any redness or drainage around the nails.Denies any systemic complaints such as fevers, chills, nausea, vomiting.   PCP: Isaac Bliss, Rayford Halsted, MD  Objective: NAD DP/PT pulses palpable, CRT less than 3 seconds Nails hypertrophic, dystrophic, elongated, brittle, discolored 10. There is tenderness overlying the nails 1-5 bilaterally. There is no surrounding erythema or drainage along the nail sites No open lesions or pre-ulcerative lesions are identified. No other areas of tenderness bilateral lower extremities. No overlying edema, erythema, increased warmth. No pain with calf compression, swelling, warmth, erythema. No changes otherwise.   Assessment: Patient presents with symptomatic onychomycosis  Plan: -Treatment options including alternatives, risks, complications were discussed -Nails sharply debrided 10 without complication/bleeding. -Discussed daily foot inspection. If there are any changes, to call the office immediately.  -Follow-up in 3 months or sooner if any problems are to arise. In the meantime, encouraged to call the office with any questions, concerns, changes symptoms.  Celesta Gentile, DPM

## 2019-10-19 NOTE — Progress Notes (Signed)
Cardiology Office Note Date:  10/19/2019  Patient ID:  Alexander, Mcdonald 04/21/1926, MRN [id number] PCP:  Isaac Bliss, Rayford Halsted, MD  Electrophysiologist:  Dr. Caryl Comes    Chief Complaint:  planned follow up  History of Present Illness: Alexander Mcdonald is a 86 y.o. male with history of BPH, HTN, HLD, CKD, chronic cystitis with recurrent UTIs, prostate cancer  He comes in today to be seen for Dr. Caryl Comes, last seen by him 08/01/19 at his initial consultation with him for bradycardia noted by the pt's ID MD who was seeing him for UTI. The pt lives with his daughter, both noting a functional decline over a couple years.  He was found to have PVCs creating functional bradycardia He was edematous without JVD or symptoms of volume OL, his amlodipine stopped > started on labetalol given limited meds 2/2 renal disease (noting hydralazine can also cause edema).  Mentioned if PVCs not improved on BB would plan monitoring prior to AAD initiation.  Planned for echo (to follow up on AS) and TSH and follow up. I do not see results for either.  He saw ID 08/27/19 with GI complaints, N/V, diarrhea   09/01/2019 He is accompanied by his daughter who he lives with. His GI symptoms started after eating something unusual for him at a restaurant, improved with bland diet, but not quite back to normal.  His daughter states  "he had to have coffee and that started things back up a little" No CP, no palpitations He remains generally fatigued, tired. His edema completely resolved off the amlodipine His EKG noted SB 45bpm, without PVCs His echo was done, pending read His home BP's reported as "perfect" given true bradycardia in the 40's, his labetalol was reduced with plans to see him back in a few weeks.  Echo noted LVEF 60-65%, grade I DD, mod-severe AS, Aortic regurgitation PHT, measures 527 msec. Aortic valve area, by VTI measures 0.79 cm. Aortic valve mean gradient measures 29.0 mmHg.   TODAY He is  again accompanied by his daughter.  She noted his BP's started to get quite high 237-628 systolic and resumed the labetalol 200mg  BID.  And often noting that his BP in the mornings are the highest.  Taking his labetalol usually at about 10 AM and again about 6-7PM (waking abuot 10ish and going to bed about 7ish. He unfortunately has shingles, on his back, and she reports no new lesions now and everything dried up.  C/o pain when moving but not otherwise. He sits most of the day, ofen napping, will ambulate with walker to go to the bathroom, to the table to eat. He is tired all of the time, particularly since March this year. The patient denies any CP, SOB, no reports of near syncope or syncope.  They get home HR 47-50's    Past Medical History:  Diagnosis Date  . ACUTE RENAL FAILURE W/LESION OF TUBULAR NECROSIS 01/04/2007  . Aortic stenosis 08/27/2019  . Bladder spasm 06/04/2014  . BPH (benign prostatic hyperplasia) 06/04/2014  . Bradycardia 06/04/2019  . CAROTID ARTERY DISEASE 06/01/2009  . DEPRESSION 07/25/2006  . DIVERTICULOSIS OF COLON 02/01/2007  . Glaucoma   . GOUT 07/25/2006  . HEMORRHOID, THROMBOSED 02/15/2009  . HYPERCHOLESTEROLEMIA 07/25/2006  . HYPERTENSION 07/25/2006  . PROSTATE CANCER, HX OF 07/25/2006  . PVC (premature ventricular contraction) 08/27/2019  . Rosacea 09/28/2008  . Unsteadiness 06/04/2019  . WEIGHT LOSS 09/01/2009    Past Surgical History:  Procedure Laterality Date  .  BIOPSY  07/11/2017   Procedure: BIOPSY;  Surgeon: Gatha Mayer, MD;  Location: [facility];  Service: Endoscopy;;  . BRONCHIAL BRUSHINGS  07/11/2017   Procedure: ESOPHAGEAL BRUSHINGS;  Surgeon: Gatha Mayer, MD;  Location: [facility];  Service: Endoscopy;;  . CAROTID ENDARTERECTOMY    . CATARACT EXTRACTION    . ESOPHAGOGASTRODUODENOSCOPY (EGD) WITH PROPOFOL N/A 07/11/2017   Procedure: ESOPHAGOGASTRODUODENOSCOPY (EGD) WITH PROPOFOL;  Surgeon: Gatha Mayer, MD;  Location: [facility];  Service:  Endoscopy;  Laterality: N/A;    Current Outpatient Medications  Medication Sig Dispense Refill  . acetaminophen (TYLENOL) 325 MG tablet Take 2 tablets (650 mg total) by mouth every 6 (six) hours as needed for mild pain (or Fever >/= 101).    Marland Kitchen apraclonidine (IOPIDINE) 0.5 % ophthalmic solution Place 1 drop into the left eye in the morning and at bedtime.     . Ascorbic Acid (VITAMIN C) 1000 MG tablet Take 1,000 mg by mouth daily.    Marland Kitchen aspirin EC 81 MG tablet Take 1 tablet (81 mg total) by mouth daily.    Marland Kitchen atorvastatin (LIPITOR) 10 MG tablet TAKE 1 TABLET(10 MG) BY MOUTH DAILY 90 tablet 1  . brinzolamide (AZOPT) 1 % ophthalmic suspension Place 1 drop into the left eye in the morning and at bedtime.    . cholecalciferol (VITAMIN D3) 25 MCG (1000 UNIT) tablet Take 1,000 Units by mouth daily.    . fish oil-omega-3 fatty acids 1000 MG capsule Take 1 g by mouth 2 (two) times daily.     Marland Kitchen labetalol (NORMODYNE) 100 MG tablet Take 1 tablet (100 mg total) by mouth 2 (two) times daily. 180 tablet 1  . latanoprost (XALATAN) 0.005 % ophthalmic solution Place 1 drop into the left eye at bedtime.    Marland Kitchen levocetirizine (XYZAL) 5 MG tablet Take 1 tablet (5 mg total) by mouth every evening. 90 tablet 0  . levothyroxine (SYNTHROID) 50 MCG tablet TAKE 1 TABLET BY MOUTH DAILY 90 tablet 0  . Multiple Vitamin (MULTIVITAMIN WITH MINERALS) TABS tablet Take 1 tablet by mouth daily. 30 tablet 0  . sodium chloride (MURO 128) 2 % ophthalmic solution Place 1 drop into the right eye daily.    Marland Kitchen zinc gluconate 50 MG tablet Take 50 mg by mouth daily.     No current facility-administered medications for this visit.    Allergies:   Shellfish allergy, Brimonidine, Brinzolamide-brimonidine, Dorzolamide hcl-timolol mal, and Timolol   Social History:  The patient  reports that he quit smoking about 31 years ago. He has never used smokeless tobacco. He reports current alcohol use of about 7.0 standard drinks of alcohol per week.  He reports that he does not use drugs.   Family History:  The patient's family history includes Hypertension in his father; Leukemia in his sister.  ROS:  Please see the history of present illness. All other systems are reviewed and otherwise negative.   PHYSICAL EXAM:  VS:  There were no vitals taken for this visit. BMI: There is no height or weight on file to calculate BMI. Well nourished, well developed, in no acute distress  HEENT: normocephalic, atraumatic  Neck: no JVD, carotid bruits or masses Cardiac: RRR; bradycardic,  2/6 SM, no rubs, or gallops Lungs:  CTA b/l, no wheezing, rhonchi or rales  Abd: soft, nontender MS: no deformity, age appropriate atrophy Ext: no edema  Skin: warm and dry, no rash Neuro:  No gross deficits appreciated Psych: euthymic mood, full affect  EKG:  Done today and reviewed by myself shows  SB 50bpm, no PVCs, one PVC on a rhythm strip  09/01/2019: SB 45bpm, no PVCs   09/01/2019: TTE IMPRESSIONS  1. Left ventricular ejection fraction, by estimation, is 60 to 65%. The  left ventricle has normal function. The left ventricle has no regional  wall motion abnormalities. There is moderate left ventricular hypertrophy.  Left ventricular diastolic  parameters are consistent with Grade I diastolic dysfunction (impaired  relaxation). Elevated left ventricular end-diastolic pressure.  2. The mitral valve is abnormal. Trivial mitral valve regurgitation.  3. The aortic valve is tricuspid. Aortic valve regurgitation is trivial.  Moderate to severe aortic valve stenosis. Aortic regurgitation PHT  measures 527 msec. Aortic valve area, by VTI measures 0.79 cm. Aortic  valve mean gradient measures 29.0 mmHg.  Aortic valve Vmax measures 3.36 m/s. Peak gradient 45 mmHg. DI 0.21.  4. The inferior vena cava is normal in size with greater than 50%  respiratory variability, suggesting right atrial pressure of 3 mmHg.  5. Right ventricular systolic function is  normal. The right ventricular  size is normal.   Comparison(s): 12/28/11 EF 55-60%.    12/28/2011: TTE Study Conclusions  - Left ventricle: The cavity size was normal. Wall thickness  was increased in a pattern of moderate LVH. Systolic  function was normal. The estimated ejection fraction was  in the range of 55% to 60%. Wall motion was normal; there  were no regional wall motion abnormalities.  - Aortic valve: Mild to moderate regurgitation. Valve area:  1.74cm^2(VTI). Valve area: 1.55cm^2 (Vmax).  - Atrial septum: No defect or patent foramen ovale was  identified.  - Pulmonic valve: Peak gradient: 39mm Hg (S).    Recent Labs: 08/27/2019: ALT 18; BUN 36; Creat 2.46; Hemoglobin 14.0; Platelets 172; Potassium 4.2; Sodium 140 09/01/2019: TSH 3.920  01/02/2019: Cholesterol 193; Direct LDL 104.0; HDL 51.70; Total CHOL/HDL Ratio 4; Triglycerides 231.0; VLDL 46.2   CrCl cannot be calculated (Patient's most recent lab result is older than the maximum 21 days allowed.).   Wt Readings from Last 3 Encounters:  09/01/19 158 lb (71.7 kg)  08/27/19 158 lb (71.7 kg)  08/01/19 168 lb (76.2 kg)     Other studies reviewed: Additional studies/records reviewed today include: summarized above  ASSESSMENT AND PLAN:  1. PVCs w/functional bradycardia     None today again on EKG or only one on a rhythm strip      Subsequently noted to have EKG with true bradycardia 405 and today 50bpm fatigue is likely multifactorial, though HR 45-50's can surely be a factor Given his AS would not object to BP 140-150 for this elderly man, though on reduced labetalol was very high Cut back again on labetalol to 100mg  BID and start hydralazine 25mg  BID Has had edema with amlodipine Given CKD, no ACE/ARBs  Try to make meds towards Q 12 hours for better coverage   2. HTN     As above  3. AS mod-severe     No syncope or clear symptoms of AS, Dr. Caryl Comes felt moderate   Disposition: I have asked  his daughter to call if BP's routinely >160, otherwise will plan virtual visit in a month to check in  Current medicines are reviewed at length with the patient today.  The patient did not have any concerns regarding medicines.  Venetia Night, PA-C 10/19/2019 10:04 AM     [facility] [address] [place]  [place] [address] [phone] (office)  [phone] (fax)

## 2019-10-20 ENCOUNTER — Other Ambulatory Visit: Payer: Self-pay

## 2019-10-20 ENCOUNTER — Encounter: Payer: Self-pay | Admitting: Physician Assistant

## 2019-10-20 ENCOUNTER — Ambulatory Visit (INDEPENDENT_AMBULATORY_CARE_PROVIDER_SITE_OTHER): Payer: Medicare Other | Admitting: Physician Assistant

## 2019-10-20 VITALS — BP 146/52 | HR 56 | Ht 64.0 in | Wt 153.2 lb

## 2019-10-20 DIAGNOSIS — I350 Nonrheumatic aortic (valve) stenosis: Secondary | ICD-10-CM | POA: Diagnosis not present

## 2019-10-20 DIAGNOSIS — R001 Bradycardia, unspecified: Secondary | ICD-10-CM | POA: Diagnosis not present

## 2019-10-20 DIAGNOSIS — I10 Essential (primary) hypertension: Secondary | ICD-10-CM | POA: Diagnosis not present

## 2019-10-20 DIAGNOSIS — I493 Ventricular premature depolarization: Secondary | ICD-10-CM | POA: Diagnosis not present

## 2019-10-20 MED ORDER — HYDRALAZINE HCL 25 MG PO TABS
25.0000 mg | ORAL_TABLET | Freq: Two times a day (BID) | ORAL | 2 refills | Status: DC
Start: 2019-10-20 — End: 2020-02-13

## 2019-10-20 MED ORDER — LABETALOL HCL 100 MG PO TABS
100.0000 mg | ORAL_TABLET | Freq: Two times a day (BID) | ORAL | 1 refills | Status: DC
Start: 2019-10-20 — End: 2020-01-30

## 2019-10-20 NOTE — Patient Instructions (Addendum)
Medication Instructions:  Your physician has recommended you make the following change in your medication:  -- DECREASE Labetalol back to 100 mg - Take 1 tablet (100 mg) by mouth twice daily  -- START Hydralazine 25 mg - Take 1 tablet (25 mg) by mouth twice daily -- NEW RX SENT  *If you need a refill on your cardiac medications before your next appointment, please call your pharmacy*  Follow-Up: At [facility], you and your health needs are our priority.  As part of our continuing mission to provide you with exceptional heart care, we have created designated Provider Care Teams.  These Care Teams include your primary Cardiologist (physician) and Advanced Practice Providers (APPs -  Physician Assistants and Nurse Practitioners) who all work together to provide you with the care you need, when you need it.  We recommend signing up for the patient portal called "MyChart".  Sign up information is provided on this After Visit Summary.  MyChart is used to connect with patients for Virtual Visits (Telemedicine).  Patients are able to view lab/test results, encounter notes, upcoming appointments, etc.  Non-urgent messages can be sent to your provider as well.   To learn more about what you can do with MyChart, go to [email or web address].    Your next appointment:   Your physician recommends that you schedule a follow-up appointment in: 1 MONTH with Tommye Standard, PA-C (Virtually) Friday 11/21/19 at 1:15 pm  The format for your next appointment:   Virtual Visit  with Tommye Standard, PA-C  -- If you are noticing that your blood pressure's systolic (top number) is continually 160 or higher please give our office a call --

## 2019-10-23 DIAGNOSIS — H348112 Central retinal vein occlusion, right eye, stable: Secondary | ICD-10-CM | POA: Diagnosis not present

## 2019-10-23 DIAGNOSIS — H353221 Exudative age-related macular degeneration, left eye, with active choroidal neovascularization: Secondary | ICD-10-CM | POA: Diagnosis not present

## 2019-10-23 DIAGNOSIS — H35362 Drusen (degenerative) of macula, left eye: Secondary | ICD-10-CM | POA: Diagnosis not present

## 2019-10-30 ENCOUNTER — Encounter (HOSPITAL_COMMUNITY): Payer: Self-pay | Admitting: Emergency Medicine

## 2019-10-30 ENCOUNTER — Other Ambulatory Visit: Payer: Self-pay

## 2019-10-30 ENCOUNTER — Emergency Department (HOSPITAL_COMMUNITY): Payer: Medicare Other

## 2019-10-30 ENCOUNTER — Emergency Department (HOSPITAL_COMMUNITY)
Admission: EM | Admit: 2019-10-30 | Discharge: 2019-10-30 | Disposition: A | Discharge status: Home / Self Care | Payer: Medicare Other | Attending: Emergency Medicine | Admitting: Emergency Medicine

## 2019-10-30 DIAGNOSIS — N189 Chronic kidney disease, unspecified: Secondary | ICD-10-CM | POA: Insufficient documentation

## 2019-10-30 DIAGNOSIS — Z87891 Personal history of nicotine dependence: Secondary | ICD-10-CM | POA: Diagnosis not present

## 2019-10-30 DIAGNOSIS — R4182 Altered mental status, unspecified: Secondary | ICD-10-CM | POA: Diagnosis not present

## 2019-10-30 DIAGNOSIS — Z8546 Personal history of malignant neoplasm of prostate: Secondary | ICD-10-CM | POA: Diagnosis not present

## 2019-10-30 DIAGNOSIS — N3000 Acute cystitis without hematuria: Secondary | ICD-10-CM

## 2019-10-30 DIAGNOSIS — J9811 Atelectasis: Secondary | ICD-10-CM | POA: Diagnosis not present

## 2019-10-30 DIAGNOSIS — Z20822 Contact with and (suspected) exposure to covid-19: Secondary | ICD-10-CM | POA: Diagnosis not present

## 2019-10-30 DIAGNOSIS — N3090 Cystitis, unspecified without hematuria: Secondary | ICD-10-CM | POA: Insufficient documentation

## 2019-10-30 DIAGNOSIS — E039 Hypothyroidism, unspecified: Secondary | ICD-10-CM | POA: Insufficient documentation

## 2019-10-30 DIAGNOSIS — H3320 Serous retinal detachment, unspecified eye: Secondary | ICD-10-CM | POA: Diagnosis not present

## 2019-10-30 DIAGNOSIS — I2510 Atherosclerotic heart disease of native coronary artery without angina pectoris: Secondary | ICD-10-CM | POA: Diagnosis not present

## 2019-10-30 DIAGNOSIS — Z7982 Long term (current) use of aspirin: Secondary | ICD-10-CM | POA: Insufficient documentation

## 2019-10-30 DIAGNOSIS — I129 Hypertensive chronic kidney disease with stage 1 through stage 4 chronic kidney disease, or unspecified chronic kidney disease: Secondary | ICD-10-CM | POA: Insufficient documentation

## 2019-10-30 DIAGNOSIS — Z79899 Other long term (current) drug therapy: Secondary | ICD-10-CM | POA: Diagnosis not present

## 2019-10-30 DIAGNOSIS — R404 Transient alteration of awareness: Secondary | ICD-10-CM | POA: Insufficient documentation

## 2019-10-30 DIAGNOSIS — G9389 Other specified disorders of brain: Secondary | ICD-10-CM | POA: Diagnosis not present

## 2019-10-30 DIAGNOSIS — I10 Essential (primary) hypertension: Secondary | ICD-10-CM | POA: Diagnosis not present

## 2019-10-30 DIAGNOSIS — S0990XA Unspecified injury of head, initial encounter: Secondary | ICD-10-CM | POA: Diagnosis not present

## 2019-10-30 DIAGNOSIS — J90 Pleural effusion, not elsewhere classified: Secondary | ICD-10-CM | POA: Diagnosis not present

## 2019-10-30 LAB — LACTIC ACID, PLASMA
Lactic Acid, Venous: 1.3 mmol/L (ref 0.5–1.9)
Lactic Acid, Venous: 0.9 mmol/L (ref 0.5–1.9)

## 2019-10-30 LAB — URINALYSIS, ROUTINE W REFLEX MICROSCOPIC
Bilirubin Urine: NEGATIVE
Glucose, UA: NEGATIVE mg/dL
Hgb urine dipstick: NEGATIVE
Ketones, ur: NEGATIVE mg/dL
Nitrite: NEGATIVE
Protein, ur: NEGATIVE mg/dL
Specific Gravity, Urine: 1.004 — ABNORMAL LOW (ref 1.005–1.030)
WBC, UA: >50 WBC/hpf — ABNORMAL HIGH (ref 0–5)
pH: 6 (ref 5.0–8.0)

## 2019-10-30 LAB — TROPONIN I (HIGH SENSITIVITY)
Troponin I (High Sensitivity): 27 ng/L — ABNORMAL HIGH (ref <18)
Troponin I (High Sensitivity): 24 ng/L — ABNORMAL HIGH (ref <18)

## 2019-10-30 LAB — CBC WITH DIFFERENTIAL/PLATELET
Abs Immature Granulocytes: 0.04 10*3/uL (ref 0.00–0.07)
Basophils Absolute: 0 10*3/uL (ref 0.0–0.1)
Basophils Relative: 1 %
Eosinophils Absolute: 0.2 10*3/uL (ref 0.0–0.5)
Eosinophils Relative: 2 %
HCT: 43 % (ref 39.0–52.0)
Hemoglobin: 13.7 g/dL (ref 13.0–17.0)
Immature Granulocytes: 1 %
Lymphocytes Relative: 21 %
Lymphs Abs: 1.6 10*3/uL (ref 0.7–4.0)
MCH: 29.8 pg (ref 26.0–34.0)
MCHC: 31.9 g/dL (ref 30.0–36.0)
MCV: 93.7 fL (ref 80.0–100.0)
Monocytes Absolute: 0.7 10*3/uL (ref 0.1–1.0)
Monocytes Relative: 10 %
Neutro Abs: 4.7 10*3/uL (ref 1.7–7.7)
Neutrophils Relative %: 65 %
Platelets: 178 10*3/uL (ref 150–400)
RBC: 4.59 MIL/uL (ref 4.22–5.81)
RDW: 14.1 % (ref 11.5–15.5)
WBC: 7.2 10*3/uL (ref 4.0–10.5)
nRBC: 0 % (ref 0.0–0.2)

## 2019-10-30 LAB — CBG MONITORING, ED: Glucose-Capillary: 95 mg/dL (ref 70–99)

## 2019-10-30 LAB — COMPREHENSIVE METABOLIC PANEL
ALT: 21 U/L (ref 0–44)
AST: 23 U/L (ref 15–41)
Albumin: 3.9 g/dL (ref 3.5–5.0)
Alkaline Phosphatase: 71 U/L (ref 38–126)
Anion gap: 13 (ref 5–15)
BUN: 33 mg/dL — ABNORMAL HIGH (ref 8–23)
CO2: 20 mmol/L — ABNORMAL LOW (ref 22–32)
Calcium: 9.8 mg/dL (ref 8.9–10.3)
Chloride: 104 mmol/L (ref 98–111)
Creatinine, Ser: 2.41 mg/dL — ABNORMAL HIGH (ref 0.61–1.24)
GFR, Estimated: 23 mL/min — ABNORMAL LOW (ref >60)
Glucose, Bld: 104 mg/dL — ABNORMAL HIGH (ref 70–99)
Potassium: 4.3 mmol/L (ref 3.5–5.1)
Sodium: 137 mmol/L (ref 135–145)
Total Bilirubin: 1.1 mg/dL (ref 0.3–1.2)
Total Protein: 6.6 g/dL (ref 6.5–8.1)

## 2019-10-30 LAB — RESPIRATORY PANEL BY RT PCR (FLU A&B, COVID)
Influenza A by PCR: NEGATIVE
Influenza B by PCR: NEGATIVE
SARS Coronavirus 2 by RT PCR: NEGATIVE

## 2019-10-30 MED ORDER — CEPHALEXIN 250 MG PO CAPS: 250.0000 mg | ORAL_CAPSULE | Freq: Three times a day (TID) | ORAL | 0 refills | Status: DC

## 2019-10-30 MED ORDER — SODIUM CHLORIDE 0.9 % IV BOLUS
1000.0000 mL | Freq: Once | INTRAVENOUS | Status: AC
  Administered 2019-10-30: 1000 mL via INTRAVENOUS

## 2019-10-30 NOTE — ED Provider Notes (Signed)
[facility] EMERGENCY DEPARTMENT Provider Note   CSN: [id number] Arrival date & time: 10/30/19  1634     History Chief Complaint  Patient presents with  . Altered Mental Status    Alexander Mcdonald is a 86 y.o. male.  HPI     86 year old male with history of hypertension, hyperlipidemia, carotid artery disease, aortic stenosis, PVCs with functional bradycardia, prostate cancer, duodenal ulcer, who presents with concern for episode of episode of altered responsiveness.  Patient normally walks with a walker, and is alert and oriented x4, however today his daughter came home around 73 and was unable to wake him up.  Reports she saw him on the camera walking with his walker to his room to lay down at about 145, however when she came home she was unable to get him to wake up.  The fire department was only able to get him to respond to painful stimuli.  Glucose was then within normal limits as were his other vital signs.  When he was in route with EMS, he began to follow commands.    The patient does not remember what happened.  Daughter notes that over the last 2 days he is also had falls, that he had gone to sit down and missed the chair and fallen down.  No known head trauma, headaches, nausea, vomiting, diarrhea, black or bloody stools, fevers, cough or urinary symptoms.  Daughter reports that she when she went to wake him up, he was laying, and eyes open like he was responding, both not seeing anything and not following commands.  She did not notice any focal neurologic changes, no weakness or numbness on one side or the other.  Reports that she tried to sit him up but he was limp and weak all over.  Past Medical History:  Diagnosis Date  . ACUTE RENAL FAILURE W/LESION OF TUBULAR NECROSIS 01/04/2007  . Aortic stenosis 08/27/2019  . Bladder spasm 06/04/2014  . BPH (benign prostatic hyperplasia) 06/04/2014  . Bradycardia 06/04/2019  . CAROTID ARTERY DISEASE 06/01/2009  .  DEPRESSION 07/25/2006  . DIVERTICULOSIS OF COLON 02/01/2007  . Glaucoma   . GOUT 07/25/2006  . HEMORRHOID, THROMBOSED 02/15/2009  . HYPERCHOLESTEROLEMIA 07/25/2006  . HYPERTENSION 07/25/2006  . PROSTATE CANCER, HX OF 07/25/2006  . PVC (premature ventricular contraction) 08/27/2019  . Rosacea 09/28/2008  . Unsteadiness 06/04/2019  . WEIGHT LOSS 09/01/2009    Patient Active Problem List   Diagnosis Date Noted  . Aortic stenosis 08/27/2019  . PVC (premature ventricular contraction) 08/27/2019  . Bradycardia 06/04/2019  . Unsteadiness 06/04/2019  . Duodenal ulcer with hemorrhage and obstruction   . Erosive esophagitis   . GI bleed 07/11/2017  . Acute blood loss anemia 07/11/2017  . Nausea and vomiting 07/07/2017  . Hyponatremia 07/05/2017  . Hypothyroidism 07/05/2017  . Mechanical complication due to implant and internal device 12/18/2016  . Pseudophakia of both eyes 04/22/2015  . Exudative age-related macular degeneration of left eye with active choroidal neovascularization ([place]) 01/12/2015  . Chronic cystitis 02/04/2014  . Primary open angle glaucoma of left eye, severe stage 05/19/2013  . Recurrent UTI 04/10/2013  . Renal failure (ARF), acute on chronic (HCC) 12/25/2011  . Chronic kidney disease 09/12/2011  . Retinal or subretinal neovascularization 09/01/2011  . Central retinal vein occlusion 03/03/2011  . Macular degeneration 12/27/2010  . Cystoid macular edema of left eye 11/22/2010  . Absolute glaucoma of right eye 11/21/2010  . CAROTID ARTERY DISEASE 06/01/2009  . HEMORRHOID,  THROMBOSED 02/15/2009  . ROSACEA 09/28/2008  . DIVERTICULOSIS OF COLON 02/01/2007  . HYPERCHOLESTEROLEMIA 07/25/2006  . GOUT 07/25/2006  . DEPRESSION 07/25/2006  . Essential hypertension 07/25/2006  . PROSTATE CANCER, HX OF 07/25/2006    Past Surgical History:  Procedure Laterality Date  . BIOPSY  07/11/2017   Procedure: BIOPSY;  Surgeon: Gatha Mayer, MD;  Location: [facility];  Service:  Endoscopy;;  . BRONCHIAL BRUSHINGS  07/11/2017   Procedure: ESOPHAGEAL BRUSHINGS;  Surgeon: Gatha Mayer, MD;  Location: [facility];  Service: Endoscopy;;  . CAROTID ENDARTERECTOMY    . CATARACT EXTRACTION    . ESOPHAGOGASTRODUODENOSCOPY (EGD) WITH PROPOFOL N/A 07/11/2017   Procedure: ESOPHAGOGASTRODUODENOSCOPY (EGD) WITH PROPOFOL;  Surgeon: Gatha Mayer, MD;  Location: [facility];  Service: Endoscopy;  Laterality: N/A;       Family History  Problem Relation Age of Onset  . Hypertension Father   . Leukemia Sister     Social History   Tobacco Use  . Smoking status: Former Smoker    Quit date: 01/17/1988    Years since quitting: 31.8  . Smokeless tobacco: Never Used  Vaping Use  . Vaping Use: Never used  Substance Use Topics  . Alcohol use: Yes    Alcohol/week: 7.0 standard drinks    Types: 7 drink(s) per week    Comment: beer  . Drug use: No    Home Medications Prior to Admission medications   Medication Sig Start Date End Date Taking? Authorizing Provider  acetaminophen (TYLENOL) 325 MG tablet Take 2 tablets (650 mg total) by mouth every 6 (six) hours as needed for mild pain (or Fever >/= 101). Patient taking differently: Take 650 mg by mouth every 6 (six) hours as needed for mild pain.  07/13/17  Yes Debbe Odea, MD  apraclonidine (IOPIDINE) 0.5 % ophthalmic solution Place 1 drop into the left eye in the morning and at bedtime.  12/03/17  Yes [provider]  Ascorbic Acid (VITAMIN C) 1000 MG tablet Take 1,000 mg by mouth daily.   Yes [provider]  aspirin EC 81 MG tablet Take 1 tablet (81 mg total) by mouth daily. 01/22/18  Yes Isaac Bliss, Rayford Halsted, MD  atorvastatin (LIPITOR) 10 MG tablet TAKE 1 TABLET(10 MG) BY MOUTH DAILY Patient taking differently: Take 10 mg by mouth daily.  08/26/19  Yes Isaac Bliss, Rayford Halsted, MD  brinzolamide (AZOPT) 1 % ophthalmic suspension Place 1 drop into the left eye in the morning and at bedtime.   Yes  [provider]  cholecalciferol (VITAMIN D3) 25 MCG (1000 UNIT) tablet Take 1,000 Units by mouth daily.   Yes [provider]  fish oil-omega-3 fatty acids 1000 MG capsule Take 1 g by mouth 2 (two) times daily.    Yes [provider]  hydrALAZINE (APRESOLINE) 25 MG tablet Take 1 tablet (25 mg total) by mouth in the morning and at bedtime. 10/20/19 01/18/20 Yes Baldwin Jamaica, PA-C  labetalol (NORMODYNE) 100 MG tablet Take 1 tablet (100 mg total) by mouth 2 (two) times daily. 10/20/19  Yes Baldwin Jamaica, PA-C  latanoprost (XALATAN) 0.005 % ophthalmic solution Place 1 drop into the left eye at bedtime. 04/28/19  Yes [provider]  levocetirizine (XYZAL) 5 MG tablet Take 1 tablet (5 mg total) by mouth every evening. 05/11/16  Yes Marletta Lor, MD  levothyroxine (SYNTHROID) 50 MCG tablet TAKE 1 TABLET BY MOUTH DAILY Patient taking differently: Take 50 mcg by mouth daily before  breakfast.  09/30/19  Yes Isaac Bliss, Rayford Halsted, MD  Multiple Vitamin (MULTIVITAMIN WITH MINERALS) TABS tablet Take 1 tablet by mouth daily. 07/13/17  Yes Debbe Odea, MD  Rhodiola 300 MG CAPS Take 300 mg by mouth daily.   Yes [provider]  sodium chloride (MURO 128) 2 % ophthalmic solution Place 1 drop into the right eye daily.   Yes [provider]  zinc gluconate 50 MG tablet Take 50 mg by mouth daily.   Yes [provider]  cephALEXin (KEFLEX) 250 MG capsule Take 1 capsule (250 mg total) by mouth 3 (three) times daily for 10 days. 10/30/19 11/09/19  Gareth Morgan, MD    Allergies    Shellfish allergy, Brimonidine, Brinzolamide-brimonidine, Dorzolamide hcl-timolol mal, and Timolol  Review of Systems   Review of Systems  Constitutional: Positive for fatigue. Negative for fever.  HENT: Negative for sore throat.   Eyes: Negative for visual disturbance.  Respiratory: Negative for shortness of breath.   Cardiovascular: Negative for chest  pain.  Gastrointestinal: Negative for abdominal pain, diarrhea, nausea and vomiting.  Genitourinary: Negative for difficulty urinating.  Musculoskeletal: Negative for back pain and neck stiffness.  Skin: Negative for rash.  Neurological: Negative for syncope and headaches.    Physical Exam Updated Vital Signs BP (!) 180/47   Pulse (!) 38   Temp 97.7 F (36.5 C) (Oral)   Resp 10   SpO2 96%   Physical Exam Vitals and nursing note reviewed.  Constitutional:      General: He is not in acute distress.    Appearance: He is well-developed. He is not diaphoretic.  HENT:     Head: Normocephalic and atraumatic.  Eyes:     Conjunctiva/sclera: Conjunctivae normal.  Cardiovascular:     Rate and Rhythm: Normal rate and regular rhythm.     Heart sounds: Normal heart sounds. No murmur heard.  No friction rub. No gallop.   Pulmonary:     Effort: Pulmonary effort is normal. No respiratory distress.     Breath sounds: Normal breath sounds. No wheezing or rales.  Abdominal:     General: There is no distension.     Palpations: Abdomen is soft.     Tenderness: There is no abdominal tenderness. There is no guarding.  Musculoskeletal:     Cervical back: Normal range of motion.  Skin:    General: Skin is warm and dry.  Neurological:     Mental Status: He is alert and oriented to person, place, and time.     GCS: GCS eye subscore is 4. GCS verbal subscore is 5. GCS motor subscore is 6.     Cranial Nerves: Cranial nerves are intact. No cranial nerve deficit, dysarthria or facial asymmetry.     Sensory: Sensation is intact. No sensory deficit.     Motor: Motor function is intact. No weakness.     Coordination: Coordination is intact.     ED Results / Procedures / Treatments   Labs (all labs ordered are listed, but only abnormal results are displayed) Labs Reviewed  COMPREHENSIVE METABOLIC PANEL - Abnormal; Notable for the following components:      Result Value   CO2 20 (*)    Glucose,  Bld 104 (*)    BUN 33 (*)    Creatinine, Ser 2.41 (*)    GFR, Estimated 23 (*)    All other components within normal limits  URINALYSIS, ROUTINE W REFLEX MICROSCOPIC - Abnormal; Notable for the following components:  APPearance CLOUDY (*)    Specific Gravity, Urine 1.004 (*)    Leukocytes,Ua LARGE (*)    WBC, UA >50 (*)    Bacteria, UA FEW (*)    All other components within normal limits  TROPONIN I (HIGH SENSITIVITY) - Abnormal; Notable for the following components:   Troponin I (High Sensitivity) 27 (*)    All other components within normal limits  TROPONIN I (HIGH SENSITIVITY) - Abnormal; Notable for the following components:   Troponin I (High Sensitivity) 24 (*)    All other components within normal limits  RESPIRATORY PANEL BY RT PCR (FLU A&B, COVID)  URINE CULTURE  CBC WITH DIFFERENTIAL/PLATELET  LACTIC ACID, PLASMA  LACTIC ACID, PLASMA  CBG MONITORING, ED    EKG EKG Interpretation  Date/Time:  Thursday October 30 2019 16:39:01 EDT Ventricular Rate:  94 PR Interval:    QRS Duration: 88 QT Interval:  400 QTC Calculation: 393 R Axis:   -19 Text Interpretation: Sinus rhythm Ventricular bigeminy Probable left atrial enlargement Borderline left axis deviation Anteroseptal infarct, old Minimal ST depression, anterolateral leads No significant change since last tracing Confirmed by Gareth Morgan [phone]) on 10/31/2019 2:05:08 AM   Radiology CT Head Wo Contrast  Result Date: 10/30/2019 CLINICAL DATA:  Altered mental status, head trauma EXAM: CT HEAD WITHOUT CONTRAST TECHNIQUE: Contiguous axial images were obtained from the base of the skull through the vertex without intravenous contrast. COMPARISON:  CT 06/02/2019 FINDINGS: Brain: Stable regions encephalomalacia in the posterior frontal lobes bilaterally. Additional hypoattenuating foci in the bilateral basal ganglia may reflect sequela of prior lacunar type infarcts. No evidence of acute infarction, hemorrhage,  hydrocephalus, extra-axial collection, visible mass lesion or mass effect. Symmetric prominence of the ventricles, cisterns and sulci compatible with parenchymal volume loss. Patchy areas of white matter hypoattenuation are most compatible with chronic microvascular angiopathy. Vascular: Atherosclerotic calcification of the carotid siphons and intradural vertebral arteries. No hyperdense vessel. Skull: No significant focal scalp swelling or hematoma. No calvarial fracture or worrisome osseous lesion. The osseous structures appear diffusely demineralized which may limit detection of small or nondisplaced fractures. Sinuses/Orbits: Layering air-fluid levels present in the left maxillary sinus and ethmoids. Atelectatic changes are present in the right maxillary sinus. Diffuse hyperostotic changes of the sinus walls and parameters, similar to comparison. Prior left glaucoma drain placement. Prior bilateral lens extractions. Likely prior right orbital tamponade for retinal detachment. No acute abnormality of the included orbits. Other: None. IMPRESSION: 1. No acute intracranial abnormality. 2. Stable regions of encephalomalacia in the posterior frontal lobes bilaterally. 3. Additional hypoattenuating foci in the bilateral basal ganglia may reflect sequela of prior lacunar type infarcts. 4. Stable parenchymal volume loss and chronic microvascular angiopathy. 5. Layering air-fluid levels in the left maxillary sinus and ethmoids. Correlate for signs and symptoms of acute on chronic sinusitis. 6. Likely prior left leg: Drain placement, bilateral lens extractions and right tamponade for retinal detachment. Electronically Signed   By: Lovena Le M.D.   On: 10/30/2019 18:31   DG Chest Portable 1 View  Result Date: 10/30/2019 CLINICAL DATA:  Altered mental status EXAM: PORTABLE CHEST 1 VIEW COMPARISON:  Jun 02, 2019 FINDINGS: The heart size is unchanged from prior study. Aortic calcifications are noted. There is bibasilar  atelectasis with possible trace left-sided pleural effusion. There is no pneumothorax. Mild vascular congestion is noted. There is no acute osseous abnormality. IMPRESSION: 1. Bibasilar atelectasis with possible trace left-sided pleural effusion. 2. Mild vascular congestion. Electronically Signed   By: Harrell Gave  Green M.D.   On: 10/30/2019 17:57    Procedures Procedures (including critical care time)  Medications Ordered in ED Medications  sodium chloride 0.9 % bolus 1,000 mL (0 mLs Intravenous Stopped 10/30/19 2326)    ED Course  I have reviewed the triage vital signs and the nursing notes.  Pertinent labs & imaging results that were available during my care of the patient were reviewed by me and considered in my medical decision making (see chart for details).    MDM Rules/Calculators/A&P                          86 year old male with history of hypertension, hyperlipidemia, carotid artery disease, aortic stenosis, PVCs with functional bradycardia, prostate cancer, duodenal ulcer, who presents with concern for episode of episode of altered responsiveness.  Mental status improved on arrival to the ED and with daughter coming into room. DDx for syncope includes cardiac arrhythmia, aortic stenosis related syncope, MI, PE, electrolyte abnormality, hypovolemia including dehydration and anemia/GI bleed, infection, TIA.CVA.  CT head without acute findings.  No focal findings on arrival to the emergency department or indication to call code stroke.  While TIA on ddx, overall history and initial exam more consistent with encephalopathy or post syncopal.  No sign of significant anemia, electrolyte abnormality.  No chest pain or dysuria reported embolus or ACS.  Is noted to have mild troponin elevation, but feel this is likely in the setting of strain, history of heart and renal disease.  Urinalysis is significant for many white blood cells in clumps, concerning for infection.  Given  generalized weakness described over the last few days, it is possible his symptoms may be secondary to infection with syncopal event in ED.   EKG shows ventricular bigeminy which is unchanged from prior. While syncope related to arrhythmia or AS is on differential, discussed that his generalized weakness may be secondary to UTI with episode of delirium/encephalopathy.  Discussed possibility of admitting to monitor on telemetry obtain ultrasounds, however at his age do feel is reasonable to take a more conservative approach.  After discussion with patient and daughter, patient reports he would rather go home.  Daughter feels he is back at his baseline.  He was able to walk independently with a walker in the emergency department.  Recommend continued follow-up with cardiology and primary care physician. Patient discharged in stable condition with understanding of reasons to return.   Final Clinical Impression(s) / ED Diagnoses Final diagnoses:  Transient alteration of awareness  Acute cystitis without hematuria    Rx / DC Orders ED Discharge Orders         Ordered    cephALEXin (KEFLEX) 250 MG capsule  3 times daily        10/30/19 2318           Gareth Morgan, MD 10/31/19 [phone]

## 2019-10-30 NOTE — ED Triage Notes (Signed)
Pt arrives via EMS- at 1330, pt went to his room to lay down. Pt's dtr came home around 1530 and was unable to wake him up. Fire only able to get him to respond to painful stimuli. CBG 022, BP 179 systolic, 72 HR, 14 resp. 91% on room air. Applied 2L [place] and improved to 95%. Pt would not follow commands, not opening eyes. EMS reports once they were en route, he was able to start following commands around 1634.

## 2019-10-30 NOTE — ED Notes (Signed)
Pt ambulated in room with walker, steady gait noted.

## 2019-10-31 ENCOUNTER — Encounter: Payer: Self-pay | Admitting: Internal Medicine

## 2019-10-31 ENCOUNTER — Telehealth: Payer: Self-pay

## 2019-10-31 MED ORDER — CEPHALEXIN 500 MG PO CAPS: 500.0000 mg | ORAL_CAPSULE | Freq: Two times a day (BID) | ORAL | 0 refills | Status: AC

## 2019-10-31 NOTE — Telephone Encounter (Signed)
I called and left message for case manager at [facility] at [phone]. I asked for the patients cephalexin to be called in verbally due to electronic prescribing being down currently and the transmission did not go through to [employer].

## 2019-10-31 NOTE — Telephone Encounter (Signed)
Patient's daughter is calling asking if Dr. Tommy Medal could resend patient's Keflex to the pharmacy. Patient's was seen in the ER for a UTI yesterday and keflex was sent, but transmission failed. Patient's daughter states she has tried to call the ER to get the Rx resent but has not been able to get anyone to resend the prescription. Please advise. Caterine Mcmeans T Brooks Sailors

## 2019-10-31 NOTE — Addendum Note (Signed)
Addended by: Daisy Floro T on: 10/31/2019 04:17 PM   Modules accepted: Orders

## 2019-10-31 NOTE — Telephone Encounter (Addendum)
Spoke with Dr. Tommy Medal regarding Keflex refill for the patient. Per Dr. Tommy Medal ok to send in cephalexin 500 mg q12h for 5 days Instructions read back to Dr. Tommy Medal and verified. Patient's daughter informed rx has been sent and dosing changed. Patient daughter verbalized understanding. Ellana Kawa T Brooks Sailors

## 2019-11-01 LAB — URINE CULTURE: Culture: >=100000 — AB

## 2019-11-03 ENCOUNTER — Telehealth: Payer: Self-pay | Admitting: *Deleted

## 2019-11-03 NOTE — Telephone Encounter (Signed)
Thanks DIminique. His urine cultures were not helpful as multiple organisms recovered

## 2019-11-03 NOTE — Telephone Encounter (Signed)
TOC CM received a message stating pt's RX did not reach pharmacy. No rx noted. Attempted call to pt and left message for return call. [place], [place] ED TOC CM [phone]

## 2019-11-17 DIAGNOSIS — H44511 Absolute glaucoma, right eye: Secondary | ICD-10-CM | POA: Diagnosis not present

## 2019-11-17 DIAGNOSIS — H401123 Primary open-angle glaucoma, left eye, severe stage: Secondary | ICD-10-CM | POA: Diagnosis not present

## 2019-11-17 DIAGNOSIS — H01116 Allergic dermatitis of left eye, unspecified eyelid: Secondary | ICD-10-CM | POA: Diagnosis not present

## 2019-11-20 NOTE — Progress Notes (Signed)
Virtual Visit via Telephone Note   This visit type was conducted due to national recommendations for restrictions regarding the COVID-19 Pandemic (e.g. social distancing) in an effort to limit this patient's exposure and mitigate transmission in our community.  Due to his co-morbid illnesses, this patient is at least at moderate risk for complications without adequate follow up.  This format is felt to be most appropriate for this patient at this time.  The patient did not have access to video technology/had technical difficulties with video requiring transitioning to audio format only (telephone).  All issues noted in this document were discussed and addressed.  No physical exam could be performed with this format.  Please refer to the patient's chart for his  consent to telehealth for [facility].    Date:  11/21/2019   ID:  Alexander Mcdonald, DOB [birth year], MRN [id number] The patient was identified using 2 identifiers.  Patient Location: Home Provider Location: Office/Clinic  PCP:  Isaac Bliss, Rayford Halsted, MD  Electrophysiologist:  Dr. Caryl Comes  Evaluation Performed:  Follow-Up Visit  Chief Complaint:   planned f/u  History of Present Illness:    Alexander Mcdonald is a 86 y.o. male with BPH, HTN, HLD, CKD, chronic cystitis with recurrent UTIs, prostate cancer  He comes in today to be seen for Dr. Caryl Comes, last seen by him 08/01/19 at his initial consultation with him for bradycardia noted by the pt's ID MD who was seeing him for UTI. The pt lives with his daughter, both noting a functional decline over a couple years.  He was found to have PVCs creating functional bradycardia He was edematous without JVD or symptoms of volume OL, his amlodipine stopped > started on labetalol given limited meds 2/2 renal disease (noting hydralazine can also cause edema).  Mentioned if PVCs not improved on BB would plan monitoring prior to AAD initiation.  Planned for echo (to follow up on AS) and TSH  and follow up. I do not see results for either.  He saw ID 08/27/19 with GI complaints, N/V, diarrhea   09/01/2019 He is accompanied by his daughter who he lives with. His GI symptoms started after eating something unusual for him at a restaurant, improved with bland diet, but not quite back to normal.  His daughter states  "he had to have coffee and that started things back up a little" No CP, no palpitations He remains generally fatigued, tired. His edema completely resolved off the amlodipine His EKG noted SB 45bpm, without PVCs His echo was done, pending read His home BP's reported as "perfect" given true bradycardia in the 40's, his labetalol was reduced with plans to see him back in a few weeks.  Echo noted LVEF 60-65%, grade I DD, mod-severe AS, Aortic regurgitation PHT, measures 527 msec. Aortic valve area, by VTI measures 0.79 cm. Aortic valve mean gradient measures 29.0 mmHg.   I saw him 10/20/2019 He is again accompanied by his daughter.  She noted his BP's started to get quite high 710-626 systolic and resumed the labetalol 200mg  BID.  And often noting that his BP in the mornings are the highest.  Taking his labetalol usually at about 10 AM and again about 6-7PM (waking abuot 10ish and going to bed about 7ish. He unfortunately has shingles, on his back, and she reports no new lesions now and everything dried up.  C/o pain when moving but not otherwise. He sits most of the day, often napping, will ambulate with walker to  go to the bathroom, to the table to eat. He is tired all of the time, particularly since March this year. The patient denies any CP, SOB, no reports of near syncope or syncope. They get home HR 47-50's My thoughts/plans were: fatigue is likely multifactorial, though HR 45-50's can surely be a factor Given his AS would not object to BP 140-150 for this elderly man, though on reduced labetalol was very high Cut back again on labetalol to 100mg  BID and start  hydralazine 25mg  BID Has had edema with amlodipine Given CKD, no ACE/ARBs  10/30/19: ER for a couple days of generalized fatigue, malasie, AMS, falls, difficult to arouse, seemed to improve upon arrival/in the ER CT head was without acute findings There was discussion of possible syncope, rhythm rate issue, ,mild abn HS Trop not felt to be ACS He did have abnormal urine and suspect this the most likely culprit. In d/w the pt/adughter, preferred not to admit, treat more conservatively and d/c on Keflex.   TODAY He is home, his daughter with him, they are on speaker phone The patient identifies himself by name and birth date. They both report that he is feeling much, much better sine our visit and especially since his ER visit.  His daughter suspects his fatigue and generalized malaise may have been early UTI symptoms. He is "back to himself" and feeling well. He denies any CP, palpitations, no SOB His BPs at home run 110's-140's usually, HR 50's No dizzy spells, near syncope or syncope  He has a visit with Dr. Moshe Cipro soon   Past Medical History:  Diagnosis Date  . ACUTE RENAL FAILURE W/LESION OF TUBULAR NECROSIS 01/04/2007  . Aortic stenosis 08/27/2019  . Bladder spasm 06/04/2014  . BPH (benign prostatic hyperplasia) 06/04/2014  . Bradycardia 06/04/2019  . CAROTID ARTERY DISEASE 06/01/2009  . DEPRESSION 07/25/2006  . DIVERTICULOSIS OF COLON 02/01/2007  . Glaucoma   . GOUT 07/25/2006  . HEMORRHOID, THROMBOSED 02/15/2009  . HYPERCHOLESTEROLEMIA 07/25/2006  . HYPERTENSION 07/25/2006  . PROSTATE CANCER, HX OF 07/25/2006  . PVC (premature ventricular contraction) 08/27/2019  . Rosacea 09/28/2008  . Unsteadiness 06/04/2019  . WEIGHT LOSS 09/01/2009   Past Surgical History:  Procedure Laterality Date  . BIOPSY  07/11/2017   Procedure: BIOPSY;  Surgeon: Gatha Mayer, MD;  Location: [facility];  Service: Endoscopy;;  . BRONCHIAL BRUSHINGS  07/11/2017   Procedure: ESOPHAGEAL BRUSHINGS;   Surgeon: Gatha Mayer, MD;  Location: [facility];  Service: Endoscopy;;  . CAROTID ENDARTERECTOMY    . CATARACT EXTRACTION    . ESOPHAGOGASTRODUODENOSCOPY (EGD) WITH PROPOFOL N/A 07/11/2017   Procedure: ESOPHAGOGASTRODUODENOSCOPY (EGD) WITH PROPOFOL;  Surgeon: Gatha Mayer, MD;  Location: [facility];  Service: Endoscopy;  Laterality: N/A;     Current Meds  Medication Sig  . acetaminophen (TYLENOL) 325 MG tablet Take 2 tablets (650 mg total) by mouth every 6 (six) hours as needed for mild pain (or Fever >/= 101). (Patient taking differently: Take 650 mg by mouth every 6 (six) hours as needed for mild pain. )  . apraclonidine (IOPIDINE) 0.5 % ophthalmic solution Place 1 drop into the left eye in the morning and at bedtime.   . Ascorbic Acid (VITAMIN C) 1000 MG tablet Take 1,000 mg by mouth daily.  Marland Kitchen aspirin EC 81 MG tablet Take 1 tablet (81 mg total) by mouth daily.  Marland Kitchen atorvastatin (LIPITOR) 10 MG tablet TAKE 1 TABLET(10 MG) BY MOUTH DAILY (Patient taking differently: Take 10 mg by  mouth daily. )  . cholecalciferol (VITAMIN D3) 25 MCG (1000 UNIT) tablet Take 1,000 Units by mouth daily.  . fish oil-omega-3 fatty acids 1000 MG capsule Take 1 g by mouth 2 (two) times daily.   . hydrALAZINE (APRESOLINE) 25 MG tablet Take 1 tablet (25 mg total) by mouth in the morning and at bedtime.  Marland Kitchen labetalol (NORMODYNE) 100 MG tablet Take 1 tablet (100 mg total) by mouth 2 (two) times daily.  Marland Kitchen latanoprost (XALATAN) 0.005 % ophthalmic solution Place 1 drop into the left eye at bedtime.  Marland Kitchen levocetirizine (XYZAL) 5 MG tablet Take 1 tablet (5 mg total) by mouth every evening.  Marland Kitchen levothyroxine (SYNTHROID) 50 MCG tablet TAKE 1 TABLET BY MOUTH DAILY (Patient taking differently: Take 50 mcg by mouth daily before breakfast. )  . Multiple Vitamin (MULTIVITAMIN WITH MINERALS) TABS tablet Take 1 tablet by mouth daily.  . Rhodiola 300 MG CAPS Take 300 mg by mouth daily.  Marland Kitchen zinc gluconate 50 MG tablet Take 50 mg by  mouth daily.  . [DISCONTINUED] brinzolamide (AZOPT) 1 % ophthalmic suspension Place 1 drop into the left eye in the morning and at bedtime.  . [DISCONTINUED] sodium chloride (MURO 128) 2 % ophthalmic solution Place 1 drop into the right eye daily.     Allergies:   Shellfish allergy, Brimonidine, Brinzolamide-brimonidine, Dorzolamide hcl-timolol mal, and Timolol   Social History   Tobacco Use  . Smoking status: Former Smoker    Quit date: 01/17/1988    Years since quitting: 31.8  . Smokeless tobacco: Never Used  Vaping Use  . Vaping Use: Never used  Substance Use Topics  . Alcohol use: Yes    Alcohol/week: 7.0 standard drinks    Types: 7 drink(s) per week    Comment: beer  . Drug use: No     Family Hx: The patient's family history includes Hypertension in his father; Leukemia in his sister.  ROS:   Please see the history of present illness.    All other systems reviewed and are negative.   Prior CV studies:   The following studies were reviewed today:  09/01/2019: TTE IMPRESSIONS  1. Left ventricular ejection fraction, by estimation, is 60 to 65%. The  left ventricle has normal function. The left ventricle has no regional  wall motion abnormalities. There is moderate left ventricular hypertrophy.  Left ventricular diastolic  parameters are consistent with Grade I diastolic dysfunction (impaired  relaxation). Elevated left ventricular end-diastolic pressure.  2. The mitral valve is abnormal. Trivial mitral valve regurgitation.  3. The aortic valve is tricuspid. Aortic valve regurgitation is trivial.  Moderate to severe aortic valve stenosis. Aortic regurgitation PHT  measures 527 msec. Aortic valve area, by VTI measures 0.79 cm. Aortic  valve mean gradient measures 29.0 mmHg.  Aortic valve Vmax measures 3.36 m/s. Peak gradient 45 mmHg. DI 0.21.  4. The inferior vena cava is normal in size with greater than 50%  respiratory variability, suggesting right atrial pressure  of 3 mmHg.  5. Right ventricular systolic function is normal. The right ventricular  size is normal.   Comparison(s): 12/28/11 EF 55-60%.    12/28/2011: TTE Study Conclusions  - Left ventricle: The cavity size was normal. Wall thickness  was increased in a pattern of moderate LVH. Systolic  function was normal. The estimated ejection fraction was  in the range of 55% to 60%. Wall motion was normal; there  were no regional wall motion abnormalities.  - Aortic valve: Mild to moderate  regurgitation. Valve area:  1.74cm^2(VTI). Valve area: 1.55cm^2 (Vmax).  - Atrial septum: No defect or patent foramen ovale was  identified.  - Pulmonic valve: Peak gradient: 23mm Hg (S).   Labs/Other Tests and Data Reviewed:    EKG:   Done 10/20/2019 and reviewed by myself shows  SB 50bpm, no PVCs, one PVC on a rhythm strip  09/01/2019: SB 45bpm, no PVCs   Recent Labs: 09/01/2019: TSH 3.920 10/30/2019: ALT 21; BUN 33; Creatinine, Ser 2.41; Hemoglobin 13.7; Platelets 178; Potassium 4.3; Sodium 137   Recent Lipid Panel Lab Results  Component Value Date/Time   CHOL 193 01/02/2019 02:49 PM   TRIG 231.0 (H) 01/02/2019 02:49 PM   HDL 51.70 01/02/2019 02:49 PM   CHOLHDL 4 01/02/2019 02:49 PM   LDLCALC 100 (H) 11/30/2016 11:55 AM   LDLDIRECT 104.0 01/02/2019 02:49 PM    Wt Readings from Last 3 Encounters:  11/21/19 153 lb (69.4 kg)  10/20/19 153 lb 3.2 oz (69.5 kg)  09/01/19 158 lb (71.7 kg)     Risk Assessment/Calculations:      Objective:    Vital Signs:  BP (!) 151/70   Pulse (!) 50   Ht 5\' 4"  (1.626 m)   Wt 153 lb (69.4 kg)   BMI 26.26 kg/m    The patient sounds good, speaks in full sentences at a normal pace and animated.  Does not sound SOB or in any distress.  His speech is clear.  ASSESSMENT & PLAN:    1. PVCs w/functional bradycardia     None on his EKG at his last visit, or only one on a rhythm strip     Had some PVCs and PACs on his ER EKG     No  palpitations or awareness 2. Subsequently found to have true bradycardia 40's-50's     No symptoms of bardyardia   3. HTN    no changes  4. AS mod-severe     No syncope or clear symptoms of AS, Dr. Caryl Comes felt moderate  Time:   Today, I have spent 10 minutes with the patient with telehealth technology discussing the above problems.     Medication Adjustments/Labs and Tests Ordered: Current medicines are reviewed at length with the patient today.  Concerns regarding medicines are outlined above.   Tests Ordered: No orders of the defined types were placed in this encounter.   Medication Changes: No orders of the defined types were placed in this encounter.   Follow Up:  In Person in 4 month(s)  Signed, Baldwin [place], Hershal Coria  11/21/2019 4:22 PM    [place]

## 2019-11-21 ENCOUNTER — Other Ambulatory Visit: Payer: Self-pay

## 2019-11-21 ENCOUNTER — Telehealth (INDEPENDENT_AMBULATORY_CARE_PROVIDER_SITE_OTHER): Payer: Medicare Other | Admitting: Physician Assistant

## 2019-11-21 VITALS — BP 151/70 | HR 50 | Ht 64.0 in | Wt 153.0 lb

## 2019-11-21 DIAGNOSIS — I10 Essential (primary) hypertension: Secondary | ICD-10-CM

## 2019-11-21 DIAGNOSIS — I350 Nonrheumatic aortic (valve) stenosis: Secondary | ICD-10-CM

## 2019-11-21 DIAGNOSIS — I493 Ventricular premature depolarization: Secondary | ICD-10-CM

## 2019-12-04 DIAGNOSIS — N184 Chronic kidney disease, stage 4 (severe): Secondary | ICD-10-CM | POA: Diagnosis not present

## 2019-12-04 DIAGNOSIS — D649 Anemia, unspecified: Secondary | ICD-10-CM | POA: Diagnosis not present

## 2019-12-04 DIAGNOSIS — R351 Nocturia: Secondary | ICD-10-CM | POA: Diagnosis not present

## 2019-12-04 DIAGNOSIS — B029 Zoster without complications: Secondary | ICD-10-CM | POA: Diagnosis not present

## 2019-12-04 DIAGNOSIS — I129 Hypertensive chronic kidney disease with stage 1 through stage 4 chronic kidney disease, or unspecified chronic kidney disease: Secondary | ICD-10-CM | POA: Diagnosis not present

## 2019-12-13 ENCOUNTER — Other Ambulatory Visit: Payer: Self-pay | Admitting: Internal Medicine

## 2019-12-22 DIAGNOSIS — H401123 Primary open-angle glaucoma, left eye, severe stage: Secondary | ICD-10-CM | POA: Diagnosis not present

## 2019-12-22 DIAGNOSIS — H01116 Allergic dermatitis of left eye, unspecified eyelid: Secondary | ICD-10-CM | POA: Diagnosis not present

## 2019-12-22 DIAGNOSIS — H44511 Absolute glaucoma, right eye: Secondary | ICD-10-CM | POA: Diagnosis not present

## 2019-12-25 DIAGNOSIS — H353221 Exudative age-related macular degeneration, left eye, with active choroidal neovascularization: Secondary | ICD-10-CM | POA: Diagnosis not present

## 2019-12-25 DIAGNOSIS — H348112 Central retinal vein occlusion, right eye, stable: Secondary | ICD-10-CM | POA: Diagnosis not present

## 2020-01-05 ENCOUNTER — Ambulatory Visit: Payer: Medicare Other | Admitting: Infectious Disease

## 2020-01-15 ENCOUNTER — Other Ambulatory Visit: Payer: Self-pay

## 2020-01-15 ENCOUNTER — Ambulatory Visit (INDEPENDENT_AMBULATORY_CARE_PROVIDER_SITE_OTHER): Payer: Medicare Other | Admitting: Podiatry

## 2020-01-15 DIAGNOSIS — B351 Tinea unguium: Secondary | ICD-10-CM | POA: Diagnosis not present

## 2020-01-15 DIAGNOSIS — M79675 Pain in left toe(s): Secondary | ICD-10-CM | POA: Diagnosis not present

## 2020-01-15 DIAGNOSIS — M79674 Pain in right toe(s): Secondary | ICD-10-CM

## 2020-01-15 NOTE — Progress Notes (Signed)
Subjective: 86 y.o. returns the office today for painful, elongated, thickened toenails which he cannot trim himself. Denies any redness or drainage around the nails. Denies any systemic complaints such as fevers, chills, nausea, vomiting.   PCP: Isaac Bliss, Rayford Halsted, MD  Objective: NAD DP/PT pulses palpable, CRT less than 3 seconds Nails hypertrophic, dystrophic, elongated, brittle, discolored 10. There is tenderness overlying the nails 1-5 bilaterally. There is no surrounding erythema or drainage along the nail sites No open lesions or pre-ulcerative lesions are identified. No pain with calf compression, swelling, warmth, erythema. No changes otherwise.   Assessment: Patient presents with symptomatic onychomycosis  Plan: -Treatment options including alternatives, risks, complications were discussed -Nails sharply debrided 10 without complication/bleeding. -Discussed daily foot inspection. If there are any changes, to call the office immediately.  -Follow-up in 3 months or sooner if any problems are to arise. In the meantime, encouraged to call the office with any questions, concerns, changes symptoms.  Celesta Gentile, DPM

## 2020-01-30 ENCOUNTER — Other Ambulatory Visit: Payer: Self-pay | Admitting: Physician Assistant

## 2020-01-30 ENCOUNTER — Other Ambulatory Visit: Payer: Self-pay | Admitting: Internal Medicine

## 2020-02-10 ENCOUNTER — Inpatient Hospital Stay (HOSPITAL_COMMUNITY): Payer: Medicare Other

## 2020-02-10 ENCOUNTER — Inpatient Hospital Stay (HOSPITAL_COMMUNITY)
Admission: EM | Admit: 2020-02-10 | Discharge: 2020-02-13 | DRG: 286 | Disposition: A | Discharge status: Home Health | Payer: Medicare Other | Attending: Family Medicine | Admitting: Family Medicine

## 2020-02-10 ENCOUNTER — Emergency Department (HOSPITAL_COMMUNITY): Payer: Medicare Other

## 2020-02-10 ENCOUNTER — Other Ambulatory Visit: Payer: Self-pay

## 2020-02-10 ENCOUNTER — Encounter (HOSPITAL_COMMUNITY): Payer: Self-pay

## 2020-02-10 DIAGNOSIS — Z20822 Contact with and (suspected) exposure to covid-19: Secondary | ICD-10-CM | POA: Diagnosis not present

## 2020-02-10 DIAGNOSIS — N184 Chronic kidney disease, stage 4 (severe): Secondary | ICD-10-CM | POA: Diagnosis not present

## 2020-02-10 DIAGNOSIS — J810 Acute pulmonary edema: Secondary | ICD-10-CM | POA: Diagnosis not present

## 2020-02-10 DIAGNOSIS — N3020 Other chronic cystitis without hematuria: Secondary | ICD-10-CM | POA: Diagnosis present

## 2020-02-10 DIAGNOSIS — J9621 Acute and chronic respiratory failure with hypoxia: Secondary | ICD-10-CM | POA: Diagnosis not present

## 2020-02-10 DIAGNOSIS — E7800 Pure hypercholesterolemia, unspecified: Secondary | ICD-10-CM | POA: Diagnosis present

## 2020-02-10 DIAGNOSIS — J80 Acute respiratory distress syndrome: Secondary | ICD-10-CM | POA: Diagnosis not present

## 2020-02-10 DIAGNOSIS — R0902 Hypoxemia: Secondary | ICD-10-CM | POA: Diagnosis not present

## 2020-02-10 DIAGNOSIS — I340 Nonrheumatic mitral (valve) insufficiency: Secondary | ICD-10-CM | POA: Diagnosis not present

## 2020-02-10 DIAGNOSIS — T502X5A Adverse effect of carbonic-anhydrase inhibitors, benzothiadiazides and other diuretics, initial encounter: Secondary | ICD-10-CM | POA: Diagnosis present

## 2020-02-10 DIAGNOSIS — I2510 Atherosclerotic heart disease of native coronary artery without angina pectoris: Secondary | ICD-10-CM | POA: Diagnosis not present

## 2020-02-10 DIAGNOSIS — K219 Gastro-esophageal reflux disease without esophagitis: Secondary | ICD-10-CM | POA: Diagnosis present

## 2020-02-10 DIAGNOSIS — I493 Ventricular premature depolarization: Secondary | ICD-10-CM | POA: Diagnosis present

## 2020-02-10 DIAGNOSIS — Z87891 Personal history of nicotine dependence: Secondary | ICD-10-CM

## 2020-02-10 DIAGNOSIS — Z888 Allergy status to other drugs, medicaments and biological substances status: Secondary | ICD-10-CM

## 2020-02-10 DIAGNOSIS — Z91013 Allergy to seafood: Secondary | ICD-10-CM | POA: Diagnosis not present

## 2020-02-10 DIAGNOSIS — I350 Nonrheumatic aortic (valve) stenosis: Secondary | ICD-10-CM | POA: Diagnosis not present

## 2020-02-10 DIAGNOSIS — Z79890 Hormone replacement therapy (postmenopausal): Secondary | ICD-10-CM

## 2020-02-10 DIAGNOSIS — C61 Malignant neoplasm of prostate: Secondary | ICD-10-CM | POA: Diagnosis present

## 2020-02-10 DIAGNOSIS — I5033 Acute on chronic diastolic (congestive) heart failure: Secondary | ICD-10-CM | POA: Diagnosis not present

## 2020-02-10 DIAGNOSIS — Z8546 Personal history of malignant neoplasm of prostate: Secondary | ICD-10-CM

## 2020-02-10 DIAGNOSIS — Z7982 Long term (current) use of aspirin: Secondary | ICD-10-CM

## 2020-02-10 DIAGNOSIS — I5031 Acute diastolic (congestive) heart failure: Secondary | ICD-10-CM | POA: Diagnosis present

## 2020-02-10 DIAGNOSIS — H3530 Unspecified macular degeneration: Secondary | ICD-10-CM | POA: Diagnosis present

## 2020-02-10 DIAGNOSIS — H409 Unspecified glaucoma: Secondary | ICD-10-CM | POA: Diagnosis present

## 2020-02-10 DIAGNOSIS — R0602 Shortness of breath: Secondary | ICD-10-CM

## 2020-02-10 DIAGNOSIS — I10 Essential (primary) hypertension: Secondary | ICD-10-CM | POA: Diagnosis present

## 2020-02-10 DIAGNOSIS — I491 Atrial premature depolarization: Secondary | ICD-10-CM | POA: Diagnosis not present

## 2020-02-10 DIAGNOSIS — N400 Enlarged prostate without lower urinary tract symptoms: Secondary | ICD-10-CM | POA: Diagnosis present

## 2020-02-10 DIAGNOSIS — I509 Heart failure, unspecified: Secondary | ICD-10-CM

## 2020-02-10 DIAGNOSIS — J90 Pleural effusion, not elsewhere classified: Secondary | ICD-10-CM | POA: Diagnosis not present

## 2020-02-10 DIAGNOSIS — Z01810 Encounter for preprocedural cardiovascular examination: Secondary | ICD-10-CM | POA: Diagnosis not present

## 2020-02-10 DIAGNOSIS — Z79899 Other long term (current) drug therapy: Secondary | ICD-10-CM

## 2020-02-10 DIAGNOSIS — I130 Hypertensive heart and chronic kidney disease with heart failure and stage 1 through stage 4 chronic kidney disease, or unspecified chronic kidney disease: Secondary | ICD-10-CM | POA: Diagnosis not present

## 2020-02-10 DIAGNOSIS — Z8249 Family history of ischemic heart disease and other diseases of the circulatory system: Secondary | ICD-10-CM

## 2020-02-10 DIAGNOSIS — E039 Hypothyroidism, unspecified: Secondary | ICD-10-CM | POA: Diagnosis present

## 2020-02-10 DIAGNOSIS — R001 Bradycardia, unspecified: Secondary | ICD-10-CM | POA: Diagnosis not present

## 2020-02-10 DIAGNOSIS — N179 Acute kidney failure, unspecified: Secondary | ICD-10-CM | POA: Diagnosis present

## 2020-02-10 DIAGNOSIS — I5030 Unspecified diastolic (congestive) heart failure: Secondary | ICD-10-CM | POA: Diagnosis present

## 2020-02-10 LAB — ECHOCARDIOGRAM COMPLETE
AR max vel: 0.66 cm2
AV Area VTI: 0.68 cm2
AV Area mean vel: 0.63 cm2
AV Mean grad: 30 mmHg
AV Peak grad: 45.5 mmHg
Ao pk vel: 3.37 m/s
Height: 65 in
P 1/2 time: 658 msec
Radius: 0.25 cm
S' Lateral: 2.2 cm
Weight: 2640 oz

## 2020-02-10 LAB — CBC WITH DIFFERENTIAL/PLATELET
Abs Immature Granulocytes: 0.07 10*3/uL (ref 0.00–0.07)
Basophils Absolute: 0.1 10*3/uL (ref 0.0–0.1)
Basophils Relative: 1 %
Eosinophils Absolute: 0.4 10*3/uL (ref 0.0–0.5)
Eosinophils Relative: 4 %
HCT: 37.1 % — ABNORMAL LOW (ref 39.0–52.0)
Hemoglobin: 12 g/dL — ABNORMAL LOW (ref 13.0–17.0)
Immature Granulocytes: 1 %
Lymphocytes Relative: 10 %
Lymphs Abs: 1 10*3/uL (ref 0.7–4.0)
MCH: 31 pg (ref 26.0–34.0)
MCHC: 32.3 g/dL (ref 30.0–36.0)
MCV: 95.9 fL (ref 80.0–100.0)
Monocytes Absolute: 0.7 10*3/uL (ref 0.1–1.0)
Monocytes Relative: 7 %
Neutro Abs: 7.8 10*3/uL — ABNORMAL HIGH (ref 1.7–7.7)
Neutrophils Relative %: 77 %
Platelets: 239 10*3/uL (ref 150–400)
RBC: 3.87 MIL/uL — ABNORMAL LOW (ref 4.22–5.81)
RDW: 14.5 % (ref 11.5–15.5)
WBC: 10 10*3/uL (ref 4.0–10.5)
nRBC: 0 % (ref 0.0–0.2)

## 2020-02-10 LAB — BASIC METABOLIC PANEL
Anion gap: 14 (ref 5–15)
BUN: 48 mg/dL — ABNORMAL HIGH (ref 8–23)
CO2: 19 mmol/L — ABNORMAL LOW (ref 22–32)
Calcium: 9.4 mg/dL (ref 8.9–10.3)
Chloride: 107 mmol/L (ref 98–111)
Creatinine, Ser: 2.13 mg/dL — ABNORMAL HIGH (ref 0.61–1.24)
GFR, Estimated: 29 mL/min — ABNORMAL LOW (ref >60)
Glucose, Bld: 124 mg/dL — ABNORMAL HIGH (ref 70–99)
Potassium: 4.5 mmol/L (ref 3.5–5.1)
Sodium: 140 mmol/L (ref 135–145)

## 2020-02-10 LAB — TROPONIN I (HIGH SENSITIVITY)
Troponin I (High Sensitivity): 17 ng/L (ref <18)
Troponin I (High Sensitivity): 21 ng/L — ABNORMAL HIGH (ref <18)

## 2020-02-10 LAB — PROCALCITONIN: Procalcitonin: 0.17 ng/mL

## 2020-02-10 LAB — SARS CORONAVIRUS 2 BY RT PCR (HOSPITAL ORDER, PERFORMED IN ~~LOC~~ HOSPITAL LAB): SARS Coronavirus 2: NEGATIVE

## 2020-02-10 LAB — BRAIN NATRIURETIC PEPTIDE: B Natriuretic Peptide: 1699.8 pg/mL — ABNORMAL HIGH (ref 0.0–100.0)

## 2020-02-10 LAB — TSH: TSH: 4.281 u[IU]/mL (ref 0.350–4.500)

## 2020-02-10 LAB — MAGNESIUM: Magnesium: 2.3 mg/dL (ref 1.7–2.4)

## 2020-02-10 MED ORDER — HYDRALAZINE HCL 50 MG PO TABS
50.0000 mg | ORAL_TABLET | Freq: Two times a day (BID) | ORAL | Status: DC
  Administered 2020-02-11 – 2020-02-12 (×5): 50 mg via ORAL
  Filled 2020-02-10 (×5): qty 1
  Filled 2020-02-10 (×2): qty 2

## 2020-02-10 MED ORDER — POLYETHYLENE GLYCOL 3350 17 G PO PACK: 17.0000 g | PACK | Freq: Every day | ORAL | Status: DC | PRN

## 2020-02-10 MED ORDER — ASPIRIN EC 81 MG PO TBEC: 81.0000 mg | DELAYED_RELEASE_TABLET | Freq: Every day | ORAL | Status: DC

## 2020-02-10 MED ORDER — ACETAMINOPHEN 325 MG PO TABS: 650.0000 mg | ORAL_TABLET | Freq: Four times a day (QID) | ORAL | Status: DC | PRN

## 2020-02-10 MED ORDER — LABETALOL HCL 100 MG PO TABS
100.0000 mg | ORAL_TABLET | Freq: Two times a day (BID) | ORAL | Status: DC
  Administered 2020-02-10 – 2020-02-12 (×6): 100 mg via ORAL
  Filled 2020-02-10 (×8): qty 1

## 2020-02-10 MED ORDER — ASPIRIN EC 81 MG PO TBEC
81.0000 mg | DELAYED_RELEASE_TABLET | Freq: Every day | ORAL | Status: DC
  Administered 2020-02-10 – 2020-02-11 (×2): 81 mg via ORAL
  Filled 2020-02-10 (×2): qty 1

## 2020-02-10 MED ORDER — LATANOPROST 0.005 % OP SOLN
1.0000 [drp] | Freq: Every day | OPHTHALMIC | Status: DC
  Administered 2020-02-11 – 2020-02-12 (×3): 1 [drp] via OPHTHALMIC
  Filled 2020-02-10: qty 2.5

## 2020-02-10 MED ORDER — LEVOTHYROXINE SODIUM 50 MCG PO TABS
50.0000 ug | ORAL_TABLET | Freq: Every day | ORAL | Status: DC
  Administered 2020-02-10 – 2020-02-13 (×4): 50 ug via ORAL
  Filled 2020-02-10 (×4): qty 1

## 2020-02-10 MED ORDER — LORATADINE 10 MG PO TABS
10.0000 mg | ORAL_TABLET | Freq: Every evening | ORAL | Status: DC
Start: 2020-02-10 — End: 2020-02-14
  Administered 2020-02-10 – 2020-02-12 (×3): 10 mg via ORAL
  Filled 2020-02-10 (×4): qty 1

## 2020-02-10 MED ORDER — FUROSEMIDE 10 MG/ML IJ SOLN
40.0000 mg | Freq: Once | INTRAMUSCULAR | Status: AC
  Administered 2020-02-10: 40 mg via INTRAVENOUS
  Filled 2020-02-10: qty 4

## 2020-02-10 MED ORDER — ADULT MULTIVITAMIN W/MINERALS CH
1.0000 | ORAL_TABLET | Freq: Every day | ORAL | Status: DC
  Administered 2020-02-10 – 2020-02-12 (×3): 1 via ORAL
  Filled 2020-02-10 (×4): qty 1

## 2020-02-10 MED ORDER — ONDANSETRON HCL 4 MG PO TABS: 4.0000 mg | ORAL_TABLET | Freq: Four times a day (QID) | ORAL | Status: DC | PRN

## 2020-02-10 MED ORDER — FUROSEMIDE 10 MG/ML IJ SOLN
40.0000 mg | Freq: Two times a day (BID) | INTRAMUSCULAR | Status: DC
  Administered 2020-02-10: 40 mg via INTRAVENOUS
  Filled 2020-02-10: qty 4

## 2020-02-10 MED ORDER — HYDRALAZINE HCL 25 MG PO TABS
25.0000 mg | ORAL_TABLET | Freq: Two times a day (BID) | ORAL | Status: DC
  Administered 2020-02-10: 25 mg via ORAL
  Filled 2020-02-10: qty 1

## 2020-02-10 MED ORDER — APRACLONIDINE HCL 0.5 % OP SOLN
1.0000 [drp] | Freq: Three times a day (TID) | OPHTHALMIC | Status: DC
  Administered 2020-02-10 – 2020-02-13 (×9): 1 [drp] via OPHTHALMIC
  Filled 2020-02-10: qty 5

## 2020-02-10 MED ORDER — ATORVASTATIN CALCIUM 10 MG PO TABS
10.0000 mg | ORAL_TABLET | Freq: Every day | ORAL | Status: DC
  Administered 2020-02-10 – 2020-02-12 (×3): 10 mg via ORAL
  Filled 2020-02-10 (×4): qty 1

## 2020-02-10 MED ORDER — FUROSEMIDE 40 MG PO TABS
40.0000 mg | ORAL_TABLET | ORAL | Status: DC
  Administered 2020-02-10 – 2020-02-11 (×2): 40 mg via ORAL
  Filled 2020-02-10 (×3): qty 1

## 2020-02-10 MED ORDER — ONDANSETRON HCL 4 MG/2ML IJ SOLN: 4.0000 mg | Freq: Four times a day (QID) | INTRAMUSCULAR | Status: DC | PRN

## 2020-02-10 MED ORDER — ACETAMINOPHEN 650 MG RE SUPP: 650.0000 mg | Freq: Four times a day (QID) | RECTAL | Status: DC | PRN

## 2020-02-10 MED ORDER — HEPARIN SODIUM (PORCINE) 5000 UNIT/ML IJ SOLN
5000.0000 [IU] | Freq: Three times a day (TID) | INTRAMUSCULAR | Status: DC
  Administered 2020-02-10 – 2020-02-13 (×9): 5000 [IU] via SUBCUTANEOUS
  Filled 2020-02-10 (×9): qty 1

## 2020-02-10 MED ORDER — LEVOCETIRIZINE DIHYDROCHLORIDE 5 MG PO TABS: 5.0000 mg | ORAL_TABLET | Freq: Every evening | ORAL | Status: DC

## 2020-02-10 MED ORDER — BRINZOLAMIDE 1 % OP SUSP: 1.0000 [drp] | Freq: Three times a day (TID) | OPHTHALMIC | Status: DC

## 2020-02-10 NOTE — ED Notes (Signed)
Placed on 2L [place] per MD.

## 2020-02-10 NOTE — ED Triage Notes (Signed)
Patient BIB EMS from home with shortness of breath around 1am. EMS arrived around 2:30pm. Family advised he is having cough/congestion worsening today. Patient coughing up clear/white sputum. 84% Room Air. Improved to 98% Nonrebreather 15L. Rhonic and rales noted in notes. History of HTN.   EMS vitals 220/108 HR 80 RR 22  97% 10L Nonrebreather (currrently, EMS bumped it down)  EMS gave nitroglycerin Pressure dropped 186/90 Not as gargly sounding after the Nitro  20G Left AC

## 2020-02-10 NOTE — ED Notes (Signed)
Breakfast tray was given. Nurse ware.

## 2020-02-10 NOTE — Consult Note (Addendum)
Cardiology Consultation:   Patient ID: JOLLY BLEICHER MRN: [id number]; DOB: 09-15-1926  Admit date: 02/10/2020 Date of Consult: 02/10/2020  Primary Care Provider: Isaac Mcdonald, Alexander Halsted, MD [facility] Cardiologist: [facility] Electrophysiologist:  Virl Axe, MD    Patient Profile:   Alexander Mcdonald is a 87 y.o. male with a PMH of moderate-severe aortic stenosis, HTN, HLD, BPH, chronic cystitis with recurrent UTI's, prostate cancer, and CKD stage 3b-4, who is being seen today for the evaluation of CHF and aortic stenosis at the request of Dr. Vallery Mcdonald.  History of Present Illness:   Alexander Mcdonald was in his usual state of health until around 1am this morning when he felt acutely short of breath. He has had a mild cough over the past few days with clear/white sputum. EMS was activated and he was found to have O2 sat of 84% on RA, improved to 98% on NRB. He was noted to have rhonchi/rales on exam with markedly elevated BP of 220/108 which improved to 186/90 after SL nitro. No complaints of chest pain. Patient was brought to [facility] ED for further evaluation.   He follows with Dr. Caryl Mcdonald for bradycardia which was felt to be 2/2 PVC's creating functional bradycardia. He was last evaluated by cardiology via a telemedicine visit with Alexander Standard, PA-C 11/21/19 at which time he was doing fairly well with improvement in fatigue following management of a UTI. His BP was improved and no complaints of dizziness, lightheadedness, or syncope. No medication changes occurred at that visit and he was recommended to follow-up in person in 4 months. He underwent an echocardiogram 08/2019 which showed EF 60-65%, G1DD, moderate LVH, no RWMA, and moderate to severe AS with mean gradient 29 mmHg and peak gradient 45 mmHg (felt to be moderate per Dr. Olin Mcdonald review.   At the time of this evaluation he reports improvement in his breathing. He has been limited in ADLs due to difficulty with vision  (right eye blindness and limited sight in left eye) but able to ambulate with a walker without significant DOE. He denies recent DOE, orthopnea, PND, LE edema, dizziness, lightheadedness, syncope, or chest pain. SOB occurred acutely early this morning. He was already awake at the time of onset. His only other complaint recently has been a mild cough with clear/white sputum. No fevers, nausea, vomiting, diarrhea, or loss of smell/taste.   ED course: afebrile, hypertensive, intermittently bradycardic to the 50s, satting in the 90s on 2L via [place]. Labs notable for electrolytes wnl, Cr 2.13 (baseline), Hgb 12.0, PLT 239, procal 0.17, HsTrop 17, BNP 1699. COVID-19 negative. EKG with sinus rhythm with ventricular bigeminy, rate 96 bpm, no STE/D. CXR with diffuse bilateral pulmonary infiltrates/edema. He was given IV lasix 40mg  this morning and admitted to medicine. Cardiology asked to evaluate.   Past Medical History:  Diagnosis Date  . ACUTE RENAL FAILURE W/LESION OF TUBULAR NECROSIS 01/04/2007  . Aortic stenosis 08/27/2019  . Bladder spasm 06/04/2014  . BPH (benign prostatic hyperplasia) 06/04/2014  . Bradycardia 06/04/2019  . CAROTID ARTERY DISEASE 06/01/2009  . DEPRESSION 07/25/2006  . DIVERTICULOSIS OF COLON 02/01/2007  . Glaucoma   . GOUT 07/25/2006  . HEMORRHOID, THROMBOSED 02/15/2009  . HYPERCHOLESTEROLEMIA 07/25/2006  . HYPERTENSION 07/25/2006  . PROSTATE CANCER, HX OF 07/25/2006  . PVC (premature ventricular contraction) 08/27/2019  . Rosacea 09/28/2008  . Unsteadiness 06/04/2019  . WEIGHT LOSS 09/01/2009    Past Surgical History:  Procedure Laterality Date  . BIOPSY  07/11/2017   Procedure: BIOPSY;  Surgeon: Gatha Mayer, MD;  Location: [facility];  Service: Endoscopy;;  . BRONCHIAL BRUSHINGS  07/11/2017   Procedure: ESOPHAGEAL BRUSHINGS;  Surgeon: Gatha Mayer, MD;  Location: [facility];  Service: Endoscopy;;  . CAROTID ENDARTERECTOMY    . CATARACT EXTRACTION    . ESOPHAGOGASTRODUODENOSCOPY  (EGD) WITH PROPOFOL N/A 07/11/2017   Procedure: ESOPHAGOGASTRODUODENOSCOPY (EGD) WITH PROPOFOL;  Surgeon: Gatha Mayer, MD;  Location: [facility];  Service: Endoscopy;  Laterality: N/A;     Home Medications:  Prior to Admission medications   Medication Sig Start Date End Date Taking? Authorizing Provider  acetaminophen (TYLENOL) 325 MG tablet Take 2 tablets (650 mg total) by mouth every 6 (six) hours as needed for mild pain (or Fever >/= 101). Patient taking differently: Take 650 mg by mouth every 6 (six) hours as needed for mild pain. 07/13/17  Yes Debbe Odea, MD  apraclonidine (IOPIDINE) 0.5 % ophthalmic solution Place 1 drop into the left eye in the morning and at bedtime.  12/03/17  Yes [provider]  Ascorbic Acid (VITAMIN C) 1000 MG tablet Take 1,000 mg by mouth daily.   Yes [provider]  aspirin EC 81 MG tablet Take 1 tablet (81 mg total) by mouth daily. 01/22/18  Yes Alexander Mcdonald, Alexander Halsted, MD  atorvastatin (LIPITOR) 10 MG tablet TAKE 1 TABLET(10 MG) BY MOUTH DAILY Patient taking differently: Take 10 mg by mouth daily. TAKE 1 TABLET(10 MG) BY MOUTH DAILY 01/30/20  Yes Alexander Mcdonald, Alexander Halsted, MD  cholecalciferol (VITAMIN D3) 25 MCG (1000 UNIT) tablet Take 1,000 Units by mouth daily.   Yes [provider]  fish oil-omega-3 fatty acids 1000 MG capsule Take 1 g by mouth daily.   Yes [provider]  hydrALAZINE (APRESOLINE) 25 MG tablet Take 1 tablet (25 mg total) by mouth in the morning and at bedtime. 10/20/19 01/18/20 Yes Baldwin Jamaica, PA-C  labetalol (NORMODYNE) 100 MG tablet TAKE 1 TABLET(100 MG) BY MOUTH TWICE DAILY Patient taking differently: Take 100 mg by mouth 2 (two) times daily. 01/30/20  Yes Burnell Blanks, MD  latanoprost (XALATAN) 0.005 % ophthalmic solution Place 1 drop into the left eye at bedtime. 04/28/19  Yes [provider]  levocetirizine (XYZAL) 5 MG tablet Take 1 tablet (5 mg total) by mouth every  evening. 05/11/16  Yes Marletta Lor, MD  levothyroxine (SYNTHROID) 50 MCG tablet TAKE 1 TABLET BY MOUTH DAILY Patient taking differently: Take 50 mcg by mouth daily before breakfast. TAKE 1 TABLET BY MOUTH DAILY 12/17/19  Yes Alexander Mcdonald, Alexander Halsted, MD  Magnesium 250 MG TABS Take 250 mg by mouth daily.   Yes [provider]  Melatonin 10 MG CAPS Take 10 mg by mouth at bedtime.   Yes [provider]  Multiple Vitamins-Minerals (CENTRUM SILVER) tablet Take 1 tablet by mouth daily.   Yes [provider]  Rhodiola 300 MG CAPS Take 300 mg by mouth daily.   Yes [provider]  zinc gluconate 50 MG tablet Take 50 mg by mouth daily.   Yes [provider]  brinzolamide (AZOPT) 1 % ophthalmic suspension 1 drop into affected eye Patient not taking: No sig reported    [provider]  Multiple Vitamin (MULTIVITAMIN WITH MINERALS) TABS tablet Take 1 tablet by mouth daily. Patient not taking: Reported on 02/10/2020 07/13/17   Debbe Odea, MD    Inpatient Medications: Scheduled Meds:  Continuous Infusions:  PRN Meds:  Allergies:    Allergies  Allergen Reactions  . Shellfish Allergy Anaphylaxis    Pt is allergic to mussels   . Brimonidine Itching    Itching and swelling red  . Brinzolamide-Brimonidine Itching  . Dorzolamide Hcl-Timolol Mal Other (See Comments)    Eye itch and redness  . Timolol Itching    Itching rash under eyes.    Social History:   Social History   Socioeconomic History  . Marital status: Married    Spouse name: Not on file  . Number of children: 3  . Years of education: Not on file  . Highest education level: Not on file  Occupational History  . Not on file  Tobacco Use  . Smoking status: Former Smoker    Quit date: 01/17/1988    Years since quitting: 32.0  . Smokeless tobacco: Never Used  Vaping Use  . Vaping Use: Never used  Substance and Sexual Activity  . Alcohol use: Yes    Alcohol/week:  7.0 Mcdonald drinks    Types: 7 drink(s) per week    Comment: beer  . Drug use: No  . Sexual activity: Never  Other Topics Concern  . Not on file  Social History Narrative  . Not on file   Social Determinants of Health   Financial Resource Strain: Not on file  Food Insecurity: Not on file  Transportation Needs: Not on file  Physical Activity: Not on file  Stress: Not on file  Social Connections: Not on file  Intimate Partner Violence: Not on file    Family History:    Family History  Problem Relation Age of Onset  . Hypertension Father   . Leukemia Sister      ROS:  Please see the history of present illness.   All other ROS reviewed and negative.     Physical Exam/Data:   Vitals:   02/10/20 0700 02/10/20 0730 02/10/20 0745 02/10/20 0845  BP: (!) 178/66 (!) 150/102 (!) 167/66 (!) 168/71  Pulse: (!) 51 68 62 (!) 58  Resp: 15 19 18 15   Temp:      TempSrc:      SpO2: 99% 98% 97% 96%  Weight:      Height:       No intake or output data in the 24 hours ending 02/10/20 0923 Last 3 Weights 02/10/2020 11/21/2019 10/20/2019  Weight (lbs) 165 lb 153 lb 153 lb 3.2 oz  Weight (kg) 74.844 kg 69.4 kg 69.491 kg     Body mass index is 27.46 kg/m.  General:  Elderly gentleman sitting upright in bed in NAD HEENT: sclera anicteric Neck: no JVD Vascular: No carotid bruits; distal pulses 2+ bilaterally  Cardiac:  normal S1, S2; RRR; +murmur, no rubs or gallops Lungs: scattered expiratory wheeze, decreased breath sounds at lung bases, scattered rhonchi Abd: soft, nontender, no hepatomegaly  Ext: trace-1+ LE edema Musculoskeletal:  No deformities, BUE and BLE strength normal and equal Skin: warm and dry  Neuro:  CNs 2-12 intact, no focal abnormalities noted Psych:  Normal affect   EKG:  The EKG was personally reviewed and demonstrates:  sinus rhythm with ventricular bigeminy, rate 96 bpm, no STE/D Telemetry:  Telemetry was personally reviewed and demonstrates:  Sinus rhythm  with ventricular bigeminy and occasional coupled PVCs.   Relevant CV Studies: Echocardiogram 08/2019: 1. Left ventricular ejection fraction, by estimation, is 60 to 65%. The  left ventricle has normal function. The left ventricle has no regional  wall motion abnormalities. There  is moderate left ventricular hypertrophy.  Left ventricular diastolic  parameters are consistent with Grade I diastolic dysfunction (impaired  relaxation). Elevated left ventricular end-diastolic pressure.   2. The mitral valve is abnormal. Trivial mitral valve regurgitation.   3. The aortic valve is tricuspid. Aortic valve regurgitation is trivial.  Moderate to severe aortic valve stenosis. Aortic regurgitation PHT  measures 527 msec. Aortic valve area, by VTI measures 0.79 cm. Aortic  valve mean gradient measures 29.0 mmHg.  Aortic valve Vmax measures 3.36 m/s. Peak gradient 45 mmHg. DI 0.21.   4. The inferior vena cava is normal in size with greater than 50%  respiratory variability, suggesting right atrial pressure of 3 mmHg.   5. Right ventricular systolic function is normal. The right ventricular  size is normal.   Laboratory Data:  High Sensitivity Troponin:   Recent Labs  Lab 02/10/20 0346  TROPONINIHS 17     Chemistry Recent Labs  Lab 02/10/20 0346  NA 140  K 4.5  CL 107  CO2 19*  GLUCOSE 124*  BUN 48*  CREATININE 2.13*  CALCIUM 9.4  GFRNONAA 29*  ANIONGAP 14    No results for input(s): PROT, ALBUMIN, AST, ALT, ALKPHOS, BILITOT in the last 168 hours. Hematology Recent Labs  Lab 02/10/20 0346  WBC 10.0  RBC 3.87*  HGB 12.0*  HCT 37.1*  MCV 95.9  MCH 31.0  MCHC 32.3  RDW 14.5  PLT 239   BNP Recent Labs  Lab 02/10/20 0346  BNP 1,699.8*    DDimer No results for input(s): DDIMER in the last 168 hours.   Radiology/Studies:  DG Chest 2 View  Result Date: 02/10/2020 CLINICAL DATA:  Shortness of breath. EXAM: CHEST - 2 VIEW COMPARISON:  10/30/2019. FINDINGS: Mediastinum  and hilar structures normal. Heart size normal. Diffuse bilateral pulmonary infiltrates/edema. Small bilateral pleural effusions. No pneumothorax. Ankylosis thoracic spine. IMPRESSION: Diffuse bilateral pulmonary infiltrates/edema. Small bilateral pleural effusions. Electronically Signed   By: Marcello Moores  Register   On: 02/10/2020 05:33     Assessment and Plan:   1. Acute diastolic CHF: patient presented with acute SOB starting around 1am this morning. No associated chest pain. O2 sats in the mid 80s on EMS arrival, improved to 90s on NRB. BP markedly elevated, improved with SL nitro, though remains high. BNP elevated to 1700. CXR with bilateral pulmonary infiltrates/edema. He was given IV lasix 40mg  in the ED with plans to continue BID. No I&Os documented at this time. Weight noted to be 74.8kg which is up from 69.4 kg at his last visit with cardiology 11/2019. Cr 2.15 on admission which appears to be baseline. HsTrop is negative x1 making ACS unlikely etiology of his acute SOB. No prior ischemic evaluation available to review.  - Await 2nd troponin - Continue IV lasix 40mg  BID - Will update an echocardiogram to re-evaluate LV function and assess for progression of his aortic stenosis  - Continue to monitor strict I&Os and daily weights.  - Continue to monitor electrolytes closely and replete as needed to maintain K >4, Mg >2 - Continue BBlocker  2. Aortic stenosis: moderate to severe on last echo 08/2019 with mean gradient 29.41mmHg and peak gradient 51mmHg. Possible progression of his AS has contributed to #1 though tolerance of SL nitro is reassuring.  - Will update an echocardiogram to re-evaluate aortic valve function  3. HTN: BP markedly elevated on EMS arrival, improved with SL nitro though remains high. Management has been limited by CKD and trouble with LE  edema on amlodipine. Current regimen includes labetalol 100mg  BID and hydralazine 25mg  BID with reported good control at telemedicine visit  11/2019.  - Continue labetalol and hydralazine for now  4. HLD: no recent lipids - Continue statin   Risk Assessment/Risk Scores:        Alexander [place] Heart Association (NYHA) Functional Class NYHA Class II        For questions or updates, please contact [facility] Please consult [email or web address] for contact info under    Signed, Abigail Butts, PA-C  02/10/2020 9:23 AM  I have seen and examined the patient along with Abigail Butts, PA-C , PA NP.  I have reviewed the chart, notes and Alexander data.  I agree with PA/NP's note.  Key Alexander complaints: breathing has improved greatly Key examination changes: very few basal rales, otherwise clear lungs, RRR, very muffled but still audible S2, late peaking AS murmur radiating to apex and neck, no edema Key Alexander findings / data: sinus rhythm w ventricular bigeminy (outflow tract morphology PVCs).  QS in V1-V2. Normal troponin. CXR pulmonary edema.  PLAN: Continue gentle diuresis, but watch renal function carefully. Suspect he is only a couple of pounds above optimal volume status and extreme BP elevation contributed to decompensation. He probably has critical AS. He is not a candidate for SAVR, but may be a TAVR candidate (rather frail, but reasonable functional status - limited by blindness more than breathing). Discussed workup for TAVR including team eval by interventional Cardiologist and surgeon, CT Angio chest/abd/pelvis, cardiac cath. He did OK w NTG yesterday, but I would avoid nitrates going forward.  Sanda Klein, MD, [place] [phone] 02/10/2020, 12:35 PM

## 2020-02-10 NOTE — ED Notes (Signed)
Cardiology at bedside.

## 2020-02-10 NOTE — Progress Notes (Signed)
°  Echocardiogram 2D Echocardiogram has been performed.  Fidel Levy 02/10/2020, 2:41 PM

## 2020-02-10 NOTE — ED Provider Notes (Signed)
[facility] DEPT Provider Note: Alexander Spurling, MD, FACEP  CSN: [id number] MRN: [id number] ARRIVAL: 02/10/20 at [place]: [place]  02/10/20 3:53 AM Alexander Mcdonald is a 87 y.o. male with shortness of breath since about 1 AM.  Symptoms are moderate.  He does not know if symptoms were worse when lying flat or with exertion.  He has associated cough that has been worsening over the past several days although the patient downplays his cough.  The cough has been productive of clear and white sputum.  EMS found his oxygen saturation to be 84% on room air which improved to 98% on a nonrebreather.  He was noted of the report rhonchi and rales.  His initial pressure was 220/108 which improved 186/90 with sublingual nitroglycerin.  Repeat blood pressure is 171/74.  He denies chest pain.  He is blind in the right eye.   Past Medical History:  Diagnosis Date  . ACUTE RENAL FAILURE W/LESION OF TUBULAR NECROSIS 01/04/2007  . Aortic stenosis 08/27/2019  . Bladder spasm 06/04/2014  . BPH (benign prostatic hyperplasia) 06/04/2014  . Bradycardia 06/04/2019  . CAROTID ARTERY DISEASE 06/01/2009  . DEPRESSION 07/25/2006  . DIVERTICULOSIS OF COLON 02/01/2007  . Glaucoma   . GOUT 07/25/2006  . HEMORRHOID, THROMBOSED 02/15/2009  . HYPERCHOLESTEROLEMIA 07/25/2006  . HYPERTENSION 07/25/2006  . PROSTATE CANCER, HX OF 07/25/2006  . PVC (premature ventricular contraction) 08/27/2019  . Rosacea 09/28/2008  . Unsteadiness 06/04/2019  . WEIGHT LOSS 09/01/2009    Past Surgical History:  Procedure Laterality Date  . BIOPSY  07/11/2017   Procedure: BIOPSY;  Surgeon: Gatha Mayer, MD;  Location: [facility];  Service: Endoscopy;;  . BRONCHIAL BRUSHINGS  07/11/2017   Procedure: ESOPHAGEAL BRUSHINGS;  Surgeon: Gatha Mayer, MD;  Location: [facility];  Service: Endoscopy;;  . CAROTID ENDARTERECTOMY    . CATARACT EXTRACTION    .  ESOPHAGOGASTRODUODENOSCOPY (EGD) WITH PROPOFOL N/A 07/11/2017   Procedure: ESOPHAGOGASTRODUODENOSCOPY (EGD) WITH PROPOFOL;  Surgeon: Gatha Mayer, MD;  Location: [facility];  Service: Endoscopy;  Laterality: N/A;    Family History  Problem Relation Age of Onset  . Hypertension Father   . Leukemia Sister     Social History   Tobacco Use  . Smoking status: Former Smoker    Quit date: 01/17/1988    Years since quitting: 32.0  . Smokeless tobacco: Never Used  Vaping Use  . Vaping Use: Never used  Substance Use Topics  . Alcohol use: Yes    Alcohol/week: 7.0 standard drinks    Types: 7 drink(s) per week    Comment: beer  . Drug use: No    Prior to Admission medications   Medication Sig Start Date End Date Taking? Authorizing Provider  acetaminophen (TYLENOL) 325 MG tablet Take 2 tablets (650 mg total) by mouth every 6 (six) hours as needed for mild pain (or Fever >/= 101). Patient taking differently: Take 650 mg by mouth every 6 (six) hours as needed for mild pain.  07/13/17   Debbe Odea, MD  acyclovir (ZOVIRAX) 800 MG tablet 1 tablet 10/06/19   [provider]  apraclonidine (IOPIDINE) 0.5 % ophthalmic solution Place 1 drop into the left eye in the morning and at bedtime.  12/03/17   [provider]  Ascorbic Acid (VITAMIN C) 1000 MG tablet Take 1,000 mg by mouth daily.    [provider]  aspirin EC 81  MG tablet Take 1 tablet (81 mg total) by mouth daily. 01/22/18   Isaac Bliss, Rayford Halsted, MD  atorvastatin (LIPITOR) 10 MG tablet TAKE 1 TABLET(10 MG) BY MOUTH DAILY 01/30/20   Isaac Bliss, Rayford Halsted, MD  brinzolamide (AZOPT) 1 % ophthalmic suspension 1 drop into affected eye    [provider]  cholecalciferol (VITAMIN D3) 25 MCG (1000 UNIT) tablet Take 1,000 Units by mouth daily.    [provider]  fish oil-omega-3 fatty acids 1000 MG capsule Take 1 g by mouth 2 (two) times daily.     [provider]  hydrALAZINE  (APRESOLINE) 25 MG tablet Take 1 tablet (25 mg total) by mouth in the morning and at bedtime. 10/20/19 01/18/20  Baldwin Jamaica, PA-C  labetalol (NORMODYNE) 100 MG tablet TAKE 1 TABLET(100 MG) BY MOUTH TWICE DAILY 01/30/20   Burnell Blanks, MD  latanoprost (XALATAN) 0.005 % ophthalmic solution Place 1 drop into the left eye at bedtime. 04/28/19   [provider]  levocetirizine (XYZAL) 5 MG tablet Take 1 tablet (5 mg total) by mouth every evening. 05/11/16   Marletta Lor, MD  levothyroxine (SYNTHROID) 50 MCG tablet TAKE 1 TABLET BY MOUTH DAILY 12/17/19   Isaac Bliss, Rayford Halsted, MD  Magnesium 250 MG TABS 1 tablet with a meal    [provider]  Melatonin 10 MG CAPS See admin instructions.    [provider]  Multiple Vitamin (MULTIVITAMIN WITH MINERALS) TABS tablet Take 1 tablet by mouth daily. 07/13/17   Debbe Odea, MD  Multiple Vitamins-Minerals (CENTRUM SILVER) tablet See admin instructions.    [provider]  Rhodiola 300 MG CAPS Take 300 mg by mouth daily.    [provider]  Zinc 50 MG TABS 1 tablet    [provider]  zinc gluconate 50 MG tablet Take 50 mg by mouth daily.    [provider]    Allergies Shellfish allergy, Brimonidine, Brinzolamide-brimonidine, Dorzolamide hcl-timolol mal, and Timolol   REVIEW OF SYSTEMS  Negative except as noted here or in the History of Present Illness.   PHYSICAL EXAMINATION  Initial Vital Signs Blood pressure (!) 171/74, pulse 84, temperature 97.7 F (36.5 C), temperature source Oral, resp. rate 20, height 5\' 5"  (1.651 m), weight 74.8 kg, SpO2 92 %.  Examination General: Well-developed, well-nourished male in no acute distress; appearance consistent with age of record HENT: normocephalic; atraumatic Eyes: Left pupil round and sluggish; right corneal opacity Neck: supple Heart: Irregular rhythm (bigeminy on monitor) Lungs: Rales in bases; rattly cough Abdomen:  soft; nondistended; nontender; bowel sounds present Extremities: No deformity; full range of motion; pulses normal; trace edema of lower legs Neurologic: Awake, alert and oriented x 2; motor function intact in all extremities and symmetric; no facial droop; hard of hearing Skin: Warm and dry Psychiatric: Normal mood and affect   RESULTS  Summary of this visit's results, reviewed and interpreted by myself:  EKG Interpretation:  Date & Time: 02/10/2020 3:39 AM  Rate: 96  Rhythm: Ventricular bigeminy  QRS Axis: normal  Intervals: normal  ST/T Wave abnormalities: normal  Conduction Disutrbances:none  Narrative Interpretation:   Old EKG Reviewed: No significant changes  Laboratory Studies: Results for orders placed or performed during the hospital encounter of 02/10/20 (from the past 24 hour(s))  Brain natriuretic peptide     Status: Abnormal   Collection Time: 02/10/20  3:46 AM  Result Value Ref Range   B Natriuretic Peptide 1,699.8 (H) 0.0 -  100.0 pg/mL  Troponin I (High Sensitivity)     Status: None   Collection Time: 02/10/20  3:46 AM  Result Value Ref Range   Troponin I (High Sensitivity) 17 <18 ng/L  CBC with Differential/Platelet     Status: Abnormal   Collection Time: 02/10/20  3:46 AM  Result Value Ref Range   WBC 10.0 4.0 - 10.5 K/uL   RBC 3.87 (L) 4.22 - 5.81 MIL/uL   Hemoglobin 12.0 (L) 13.0 - 17.0 g/dL   HCT 37.1 (L) 39.0 - 52.0 %   MCV 95.9 80.0 - 100.0 fL   MCH 31.0 26.0 - 34.0 pg   MCHC 32.3 30.0 - 36.0 g/dL   RDW 14.5 11.5 - 15.5 %   Platelets 239 150 - 400 K/uL   nRBC 0.0 0.0 - 0.2 %   Neutrophils Relative % 77 %   Neutro Abs 7.8 (H) 1.7 - 7.7 K/uL   Lymphocytes Relative 10 %   Lymphs Abs 1.0 0.7 - 4.0 K/uL   Monocytes Relative 7 %   Monocytes Absolute 0.7 0.1 - 1.0 K/uL   Eosinophils Relative 4 %   Eosinophils Absolute 0.4 0.0 - 0.5 K/uL   Basophils Relative 1 %   Basophils Absolute 0.1 0.0 - 0.1 K/uL   Immature Granulocytes 1 %   Abs Immature  Granulocytes 0.07 0.00 - 0.07 K/uL  Basic metabolic panel     Status: Abnormal   Collection Time: 02/10/20  3:46 AM  Result Value Ref Range   Sodium 140 135 - 145 mmol/L   Potassium 4.5 3.5 - 5.1 mmol/L   Chloride 107 98 - 111 mmol/L   CO2 19 (L) 22 - 32 mmol/L   Glucose, Bld 124 (H) 70 - 99 mg/dL   BUN 48 (H) 8 - 23 mg/dL   Creatinine, Ser 2.13 (H) 0.61 - 1.24 mg/dL   Calcium 9.4 8.9 - 10.3 mg/dL   GFR, Estimated 29 (L) >60 mL/min   Anion gap 14 5 - 15  SARS Coronavirus 2 by RT PCR (hospital order, performed in [facility] hospital lab) Nasopharyngeal Nasopharyngeal Swab     Status: None   Collection Time: 02/10/20  3:47 AM   Specimen: Nasopharyngeal Swab  Result Value Ref Range   SARS Coronavirus 2 NEGATIVE NEGATIVE   Imaging Studies: DG Chest 2 View  Result Date: 02/10/2020 CLINICAL DATA:  Shortness of breath. EXAM: CHEST - 2 VIEW COMPARISON:  10/30/2019. FINDINGS: Mediastinum and hilar structures normal. Heart size normal. Diffuse bilateral pulmonary infiltrates/edema. Small bilateral pleural effusions. No pneumothorax. Ankylosis thoracic spine. IMPRESSION: Diffuse bilateral pulmonary infiltrates/edema. Small bilateral pleural effusions. Electronically Signed   By: Marcello Moores  Register   On: 02/10/2020 05:33    ED COURSE and MDM  Nursing notes, initial and subsequent vitals signs, including pulse oximetry, reviewed and interpreted by myself.  Vitals:   02/10/20 0415 02/10/20 0430 02/10/20 0530 02/10/20 0600  BP: (!) 165/69 (!) 170/52 (!) 151/56 (!) 159/59  Pulse: (!) 42 (!) 57 (!) 58 (!) 54  Resp: (!) 29 16 18 14   Temp:      TempSrc:      SpO2: 96% 96% 96% 98%  Weight:      Height:       Medications  furosemide (LASIX) injection 40 mg (40 mg Intravenous Given 02/10/20 6295)    5:51 AM The patient is afebrile, has no leukocytosis and is negative for Covid.  I favor CHF over pneumonia.  We will go ahead and administer  Lasix 40 mg IV.  BNP pending.  6:16 AM Hospitalist to  admit.   PROCEDURES  Procedures   ED DIAGNOSES     ICD-10-CM   1. Acute pulmonary edema (HCC)  J81.0   2. Hypoxia  R09.02   3. Shortness of breath  R06.02        Shanon Rosser, MD 02/10/20 [phone]

## 2020-02-10 NOTE — ED Notes (Signed)
Patient was given his lunch tray.

## 2020-02-10 NOTE — H&P (Signed)
History and Physical    Alexander Mcdonald ZOX:[id number] DOB: [birth year] DOA: 02/10/2020  PCP: Isaac Bliss, Rayford Halsted, MD   Patient coming from: Home  I have personally briefly reviewed patient's old medical records in [facility]  Chief Complaint: Worsening dyspnea and cough.  HPI: Alexander Mcdonald is a 87 y.o. male of [place] with significant macular degeneration, HTN, HLD, CKD CRT 2.2, GERD/PUD/UGIB, BPH/prostate CA managed conservatively, carotid stenosis s/p left CEA, ventricular bigeminy, moderate to severe AS AVA 0.79, MPG 29, V-max 3.36 m/s was brought in by EMS for worsening dyspnea and cough.  Daughter reports that around 2 AM he called for help.  Patient shouted  "I am dying, cannot breathe."  She could hear significant wheezing and cough that was mostly dry.  She also reports he has a constant postnasal drip that contributes to cough. Otherwise she denies any fever chills or leg swelling.  EMS found patient to be hypoxic to 84% on room air and was placed on nonrebreather mask.  Concurrent blood pressure was 220/108 but improved to 186/90 with NTG SL.  She currently lives with daughter Alexander Mcdonald.  Ambulates with a walker.  Former smoker.  Occasionally drinks beer.  Used to be a [employer].   ED Course: T-max 97.7 Blood pressure 167/66, pulse 62, respiratory rate 18-29, SPO2 97% on 2 L  -BNP 1699, troponin IX 17 9-WBC 10, Hb 12, platelets 229 -Sodium 140, potassium 4.5, bicarb 19, BUN 48, creatinine 2.13 -Covid PCR negative, procalcitonin 0.179  -CXR with bilateral pulmonary infiltrates/edema bilateral pleural effusions.  #ED Meds given: Lasix 40 mg IV once  Review of Systems: As per HPI otherwise 10 point review of systems negative.     Past Medical History:  Diagnosis Date  . ACUTE RENAL FAILURE W/LESION OF TUBULAR NECROSIS 01/04/2007  . Aortic stenosis 08/27/2019  . Bladder spasm 06/04/2014  . BPH (benign prostatic hyperplasia) 06/04/2014  . Bradycardia  06/04/2019  . CAROTID ARTERY DISEASE 06/01/2009  . DEPRESSION 07/25/2006  . DIVERTICULOSIS OF COLON 02/01/2007  . Glaucoma   . GOUT 07/25/2006  . HEMORRHOID, THROMBOSED 02/15/2009  . HYPERCHOLESTEROLEMIA 07/25/2006  . HYPERTENSION 07/25/2006  . PROSTATE CANCER, HX OF 07/25/2006  . PVC (premature ventricular contraction) 08/27/2019  . Rosacea 09/28/2008  . Unsteadiness 06/04/2019  . WEIGHT LOSS 09/01/2009    Past Surgical History:  Procedure Laterality Date  . BIOPSY  07/11/2017   Procedure: BIOPSY;  Surgeon: Gatha Mayer, MD;  Location: [facility];  Service: Endoscopy;;  . BRONCHIAL BRUSHINGS  07/11/2017   Procedure: ESOPHAGEAL BRUSHINGS;  Surgeon: Gatha Mayer, MD;  Location: [facility];  Service: Endoscopy;;  . CAROTID ENDARTERECTOMY    . CATARACT EXTRACTION    . ESOPHAGOGASTRODUODENOSCOPY (EGD) WITH PROPOFOL N/A 07/11/2017   Procedure: ESOPHAGOGASTRODUODENOSCOPY (EGD) WITH PROPOFOL;  Surgeon: Gatha Mayer, MD;  Location: [facility];  Service: Endoscopy;  Laterality: N/A;     reports that he quit smoking about 32 years ago. He has never used smokeless tobacco. He reports current alcohol use of about 7.0 standard drinks of alcohol per week. He reports that he does not use drugs.  Allergies  Allergen Reactions  . Shellfish Allergy Anaphylaxis    Pt is allergic to mussels   . Brimonidine Itching    Itching and swelling red  . Brinzolamide-Brimonidine Itching  . Dorzolamide Hcl-Timolol Mal Other (See Comments)    Eye itch and redness  . Timolol Itching    Itching rash under eyes.  Family History  Problem Relation Age of Onset  . Hypertension Father   . Leukemia Sister      Prior to Admission medications   Medication Sig Start Date End Date Taking? Authorizing Provider  acetaminophen (TYLENOL) 325 MG tablet Take 2 tablets (650 mg total) by mouth every 6 (six) hours as needed for mild pain (or Fever >/= 101). Patient taking differently: Take 650 mg by mouth every 6 (six)  hours as needed for mild pain. 07/13/17  Yes Debbe Odea, MD  apraclonidine (IOPIDINE) 0.5 % ophthalmic solution Place 1 drop into the left eye in the morning and at bedtime.  12/03/17  Yes [provider]  Ascorbic Acid (VITAMIN C) 1000 MG tablet Take 1,000 mg by mouth daily.   Yes [provider]  aspirin EC 81 MG tablet Take 1 tablet (81 mg total) by mouth daily. 01/22/18  Yes Isaac Bliss, Rayford Halsted, MD  atorvastatin (LIPITOR) 10 MG tablet TAKE 1 TABLET(10 MG) BY MOUTH DAILY Patient taking differently: Take 10 mg by mouth daily. TAKE 1 TABLET(10 MG) BY MOUTH DAILY 01/30/20  Yes Isaac Bliss, Rayford Halsted, MD  cholecalciferol (VITAMIN D3) 25 MCG (1000 UNIT) tablet Take 1,000 Units by mouth daily.   Yes [provider]  fish oil-omega-3 fatty acids 1000 MG capsule Take 1 g by mouth daily.   Yes [provider]  hydrALAZINE (APRESOLINE) 25 MG tablet Take 1 tablet (25 mg total) by mouth in the morning and at bedtime. 10/20/19 01/18/20 Yes Baldwin Jamaica, PA-C  labetalol (NORMODYNE) 100 MG tablet TAKE 1 TABLET(100 MG) BY MOUTH TWICE DAILY Patient taking differently: Take 100 mg by mouth 2 (two) times daily. 01/30/20  Yes Burnell Blanks, MD  latanoprost (XALATAN) 0.005 % ophthalmic solution Place 1 drop into the left eye at bedtime. 04/28/19  Yes [provider]  levocetirizine (XYZAL) 5 MG tablet Take 1 tablet (5 mg total) by mouth every evening. 05/11/16  Yes Marletta Lor, MD  levothyroxine (SYNTHROID) 50 MCG tablet TAKE 1 TABLET BY MOUTH DAILY Patient taking differently: Take 50 mcg by mouth daily before breakfast. TAKE 1 TABLET BY MOUTH DAILY 12/17/19  Yes Isaac Bliss, Rayford Halsted, MD  Magnesium 250 MG TABS Take 250 mg by mouth daily.   Yes [provider]  Melatonin 10 MG CAPS Take 10 mg by mouth at bedtime.   Yes [provider]  Multiple Vitamins-Minerals (CENTRUM SILVER) tablet Take 1 tablet by mouth daily.   Yes  [provider]  Rhodiola 300 MG CAPS Take 300 mg by mouth daily.   Yes [provider]  zinc gluconate 50 MG tablet Take 50 mg by mouth daily.   Yes [provider]  brinzolamide (AZOPT) 1 % ophthalmic suspension 1 drop into affected eye Patient not taking: No sig reported    [provider]  Multiple Vitamin (MULTIVITAMIN WITH MINERALS) TABS tablet Take 1 tablet by mouth daily. Patient not taking: Reported on 02/10/2020 07/13/17   Debbe Odea, MD    Physical Exam: Vitals:   02/10/20 0645 02/10/20 0700 02/10/20 0730 02/10/20 0745  BP: (!) 178/75 (!) 178/66 (!) 150/102 (!) 167/66  Pulse: 62 (!) 51 68 62  Resp: 19 15 19 18   Temp:      TempSrc:      SpO2: 98% 99% 98% 97%  Weight:      Height:        Constitutional: NAD, calm, comfortable Legally blind secondary to advanced macular degeneration.  Vitals:   02/10/20 0645 02/10/20 0700 02/10/20 0730 02/10/20 0745  BP: (!) 178/75 (!) 178/66 (!) 150/102 (!) 167/66  Pulse: 62 (!) 51 68 62  Resp: 19 15 19 18   Temp:      TempSrc:      SpO2: 98% 99% 98% 97%  Weight:      Height:       Eyes: PERRL, lids and conjunctivae normal ENMT: Mucous membranes are moist. Posterior pharynx clear of any exudate or lesions.Normal dentition.   No JVD present.  Neck: normal, supple, no masses, no thyromegaly Respiratory: Prolonged expiratory effort.  Mild wheezing present.  Cardiovascular: Irregular with 3/6 systolic murmur over the aortic area and radiating to carotids.  Abdomen: no tenderness, no masses palpated. No hepatosplenomegaly. Bowel sounds positive.   Musculoskeletal: no clubbing / cyanosis. No joint deformity upper and lower extremities. Good ROM, no contractures. Normal muscle tone.   Skin: no rashes, lesions, ulcers. No induration Neurologic: CN 2-12 grossly intact. Sensation intact, DTR normal. Strength 5/5 in all 4.  Psychiatric: Normal judgment and insight. Alert and oriented x 3. Normal  mood.    Labs on Admission: I have personally reviewed following labs and imaging studies  CBC: Recent Labs  Lab 02/10/20 0346  WBC 10.0  NEUTROABS 7.8*  HGB 12.0*  HCT 37.1*  MCV 95.9  PLT 790   Basic Metabolic Panel: Recent Labs  Lab 02/10/20 0346  NA 140  K 4.5  CL 107  CO2 19*  GLUCOSE 124*  BUN 48*  CREATININE 2.13*  CALCIUM 9.4    Urine analysis:    Component Value Date/Time   COLORURINE YELLOW 10/30/2019 1845   APPEARANCEUR CLOUDY (A) 10/30/2019 1845   LABSPEC 1.004 (L) 10/30/2019 1845   PHURINE 6.0 10/30/2019 1845   GLUCOSEU NEGATIVE 10/30/2019 1845   HGBUR NEGATIVE 10/30/2019 1845   HGBUR large 12/17/2006 1056   [place] 10/30/2019 1845   BILIRUBINUR neg 01/02/2019 1529   KETONESUR NEGATIVE 10/30/2019 1845   PROTEINUR NEGATIVE 10/30/2019 1845   UROBILINOGEN 0.2 01/02/2019 1529   UROBILINOGEN 0.2 06/01/2014 1448   NITRITE NEGATIVE 10/30/2019 1845   LEUKOCYTESUR LARGE (A) 10/30/2019 1845    Radiological Exams on Admission: DG Chest 2 View  Result Date: 02/10/2020 CLINICAL DATA:  Shortness of breath. EXAM: CHEST - 2 VIEW COMPARISON:  10/30/2019. FINDINGS: Mediastinum and hilar structures normal. Heart size normal. Diffuse bilateral pulmonary infiltrates/edema. Small bilateral pleural effusions. No pneumothorax. Ankylosis thoracic spine. IMPRESSION: Diffuse bilateral pulmonary infiltrates/edema. Small bilateral pleural effusions. Electronically Signed   By: Marcello Moores  Register   On: 02/10/2020 05:33    EKG: Independently reviewed.  Ventricular bigeminy present.  Assessment/Plan Active Problems:   HYPERCHOLESTEROLEMIA   Essential hypertension   PROSTATE CANCER, HX OF   (HFpEF) heart failure with preserved ejection fraction (HCC)   Acute on chronic respiratory failure with hypoxia (HCC)   Hypoxia   CHF (congestive heart failure) (HCC)  #Acute hypoxic respiratory failure -Likely secondary to acute HFpEF in light of severe AS. -Other  contributing factors include uncontrolled hypertension as well as ventricular bigeminy which can possibly be from ischemia -Extremely careful diuresis to avoid hemodynamic decompensation from severe AS since relying on preload.    -He appears euvolemic after 1 dose of IV Lasix 40mg  in the ED.  Given his history of BPH/retention/chronic cystitis on reluctant to put on daily Lasix but will consider biweekly or thrice weekly regimen.  -We will uptitrate his hydralazine and attempt to achieve better blood pressure  control.  -He is currently on labetalol to suppress his ventricular bigeminy however given increased burden may need to be on AADs.  Possible need for Holter. -Cardiology has been consulted and repeat TTE pending. Please discuss ischemia eval for w/u of his PVCs.    #PUD/GERD  #HTN uncontrolled  #CKD stage III-IV baseline creatinine 2.2 -No intervention.    #BPH/active prostate CA/chronic cystitis     DVT prophylaxis: subQ heparin   Code Status: Full code Family Communication: Daughter Alexander Mcdonald.   Disposition Plan: SNF  Consults called:Cardiology.   Admission status:inpt on [place].     Rudie Sermons MD [facility]   If 7PM-7AM, please contact night-coverage [email or web address] Password [facility]  02/10/2020, 8:14 AM

## 2020-02-11 ENCOUNTER — Inpatient Hospital Stay (HOSPITAL_COMMUNITY): Payer: Medicare Other

## 2020-02-11 DIAGNOSIS — I350 Nonrheumatic aortic (valve) stenosis: Secondary | ICD-10-CM | POA: Diagnosis not present

## 2020-02-11 DIAGNOSIS — J9621 Acute and chronic respiratory failure with hypoxia: Secondary | ICD-10-CM | POA: Diagnosis not present

## 2020-02-11 DIAGNOSIS — J810 Acute pulmonary edema: Secondary | ICD-10-CM | POA: Insufficient documentation

## 2020-02-11 DIAGNOSIS — Z01810 Encounter for preprocedural cardiovascular examination: Secondary | ICD-10-CM

## 2020-02-11 DIAGNOSIS — I5033 Acute on chronic diastolic (congestive) heart failure: Secondary | ICD-10-CM | POA: Diagnosis not present

## 2020-02-11 LAB — CBC
HCT: 38.5 % — ABNORMAL LOW (ref 39.0–52.0)
Hemoglobin: 12.6 g/dL — ABNORMAL LOW (ref 13.0–17.0)
MCH: 30.5 pg (ref 26.0–34.0)
MCHC: 32.7 g/dL (ref 30.0–36.0)
MCV: 93.2 fL (ref 80.0–100.0)
Platelets: 248 10*3/uL (ref 150–400)
RBC: 4.13 MIL/uL — ABNORMAL LOW (ref 4.22–5.81)
RDW: 14.2 % (ref 11.5–15.5)
WBC: 10.1 10*3/uL (ref 4.0–10.5)
nRBC: 0 % (ref 0.0–0.2)

## 2020-02-11 LAB — COMPREHENSIVE METABOLIC PANEL
ALT: 60 U/L — ABNORMAL HIGH (ref 0–44)
AST: 29 U/L (ref 15–41)
Albumin: 3.6 g/dL (ref 3.5–5.0)
Alkaline Phosphatase: 139 U/L — ABNORMAL HIGH (ref 38–126)
Anion gap: 15 (ref 5–15)
BUN: 48 mg/dL — ABNORMAL HIGH (ref 8–23)
CO2: 25 mmol/L (ref 22–32)
Calcium: 9.4 mg/dL (ref 8.9–10.3)
Chloride: 101 mmol/L (ref 98–111)
Creatinine, Ser: 2.22 mg/dL — ABNORMAL HIGH (ref 0.61–1.24)
GFR, Estimated: 27 mL/min — ABNORMAL LOW (ref >60)
Glucose, Bld: 126 mg/dL — ABNORMAL HIGH (ref 70–99)
Potassium: 3.7 mmol/L (ref 3.5–5.1)
Sodium: 141 mmol/L (ref 135–145)
Total Bilirubin: 1.2 mg/dL (ref 0.3–1.2)
Total Protein: 6.6 g/dL (ref 6.5–8.1)

## 2020-02-11 LAB — PROTIME-INR
INR: 1.1 (ref 0.8–1.2)
Prothrombin Time: 13.5 seconds (ref 11.4–15.2)

## 2020-02-11 MED ORDER — ASPIRIN 81 MG PO CHEW
81.0000 mg | CHEWABLE_TABLET | ORAL | Status: AC
  Administered 2020-02-12: 81 mg via ORAL
  Filled 2020-02-11: qty 1

## 2020-02-11 MED ORDER — ASPIRIN EC 81 MG PO TBEC
81.0000 mg | DELAYED_RELEASE_TABLET | Freq: Every day | ORAL | Status: DC
Start: 2020-02-13 — End: 2020-02-14
  Filled 2020-02-11: qty 1

## 2020-02-11 MED ORDER — SODIUM CHLORIDE 0.9 % IV SOLN: INTRAVENOUS | Status: DC

## 2020-02-11 MED ORDER — SODIUM CHLORIDE 0.9% FLUSH
3.0000 mL | Freq: Two times a day (BID) | INTRAVENOUS | Status: DC
  Administered 2020-02-11 (×2): 3 mL via INTRAVENOUS

## 2020-02-11 NOTE — ED Notes (Signed)
Assisted patient with eating breakfast.

## 2020-02-11 NOTE — ED Notes (Signed)
Update provided to family.

## 2020-02-11 NOTE — Progress Notes (Signed)
PROGRESS NOTE   Alexander Mcdonald  VHQ:[id number] DOB: 11-18-1926 DOA: 02/10/2020 PCP: Alexander Mcdonald, Alexander Halsted, MD  Brief Narrative:  87 year old home dwelling white male-ambulates with walker HTN HLD hypothyroid BPH/prostate cancer conservatively managed Carotid stenosis status post left CEA macular degeneration CKD 3 baseline creatinine 2.2 Moderate to severe aortic stenosis Prior GI bleed 06/2017 EGD showed severe esophagitis duodenal ulcer at the time  Patient admitted hypoxic 84% on room air stating dyspnea blood pressure found to be elevated Work-up showed BNP 1699 troponin XVII BUN/creatinine 48/2.1 CXR pulmonary infiltrate  Found to have heart failure and cardiology consulted  Assessment & Plan:   Active Problems:   HYPERCHOLESTEROLEMIA   Essential hypertension   PROSTATE CANCER, HX OF   (HFpEF) heart failure with preserved ejection fraction (HCC)   Acute on chronic respiratory failure with hypoxia (HCC)   Hypoxia   CHF (congestive heart failure) ([place])   1. Acute superimposed on HFpEF-this admission EF 60-65% a. New onset likely exacerbated by AOS b. Diuresing as per cardiology does not seem grossly volume overloaded c. Continue labetalol 100 twice daily, hydralazine 50 twice daily 2. Aortic stenosis/PVC/functional bradycardia a. Aortic stenosis seems to vary based on echo although seems to have a poor functional baseline b. Defer to cardiology if candidate for TAVR 3. HTN  4. AKI superimposed on CKD 3 a. Creatinine about the same b. Not a candidate for ACE 5. Prostate cancer/BPH a. Outpatient monitoring and management as per urologist/oncologist 6. Carotid stenosis in the past a. Continue aspirin 81, atorvastatin 10 7. Hypothyroid a. Continue Synthroid 50 8. Prior GI bleed a. Monitor trends  DVT prophylaxis: Lovenox Code Status: Full Family Communication: Talked to Alexander Mcdonald [phone] --02/11/20 Disposition:  Status is: Inpatient  Remains inpatient  appropriate because:Hemodynamically unstable, Persistent severe electrolyte disturbances and Ongoing diagnostic testing needed not appropriate for outpatient work up   Dispo: The patient is from: Home              Anticipated d/c is to: Home              Anticipated d/c date is: 2 days              Patient currently is not medically stable to d/c.   Difficult to place patient No       Consultants:   Cardiology  Procedures: Echo as above  Antimicrobials: None   Subjective: Doing fair Sitting up in bed no chest pain no fever Cannot tell me if he feels swollen or not No lower extremity edema He is about to have breakfast but informs me that he is legally blind in his right eye  Objective: Vitals:   02/11/20 0330 02/11/20 0418 02/11/20 0538 02/11/20 0713  BP: 122/85 (!) 141/57 (!) 154/80 (!) 186/81  Pulse: (!) 58 61 (!) 58 79  Resp: 18 18 (!) 22 16  Temp:      TempSrc:      SpO2: 95% 95% 97% 99%  Weight:      Height:       No intake or output data in the 24 hours ending 02/11/20 0716 Filed Weights   02/10/20 0343  Weight: 74.8 kg    Examination:    Data Reviewed: I have personally reviewed following labs and imaging studies Baseline BUN/creatinine 33/2.4-on admission 48/2.1-->48/2.22 Potassium 3.7 ALT 60 White count 10.1, hemoglobin 12.6  Echocardiogram 1/25 IMPRESSIONS    1. Left ventricular ejection fraction, by estimation, is 60 to 65%. The  left ventricle  has normal function. The left ventricle has no regional  wall motion abnormalities. There is mild left ventricular hypertrophy.  Left ventricular diastolic parameters  are consistent with Grade I diastolic dysfunction (impaired relaxation).  2. Right ventricular systolic function is normal. The right ventricular  size is normal. Tricuspid regurgitation signal is inadequate for assessing  PA pressure.  3. Moderate pleural effusion in the left lateral region.  4. The mitral valve is normal in  structure. Mild mitral valve  regurgitation. No evidence of mitral stenosis.  5. The aortic valve is tricuspid. Aortic valve regurgitation is trivial.  Moderate to severe aortic valve stenosis.  6. The inferior vena cava is normal in size with greater than 50%  respiratory variability, suggesting right atrial pressure of 3 mmHg.   Radiology Studies: DG Chest 2 View  Result Date: 02/10/2020 CLINICAL DATA:  Shortness of breath. EXAM: CHEST - 2 VIEW COMPARISON:  10/30/2019. FINDINGS: Mediastinum and hilar structures normal. Heart size normal. Diffuse bilateral pulmonary infiltrates/edema. Small bilateral pleural effusions. No pneumothorax. Ankylosis thoracic spine. IMPRESSION: Diffuse bilateral pulmonary infiltrates/edema. Small bilateral pleural effusions. Electronically Signed   By: Marcello Moores  Register   On: 02/10/2020 05:33   ECHOCARDIOGRAM COMPLETE  Result Date: 02/10/2020    ECHOCARDIOGRAM REPORT   Patient Name:   Alexander Mcdonald Date of Exam: 02/10/2020 Medical Rec #:  [id number]       Height:       65.0 in Accession #:    [id number]      Weight:       165.0 lb Date of Birth:  September 08, 1926        BSA:          1.823 m Patient Age:    23 years        BP:           155/67 mmHg Patient Gender: M               HR:           67 bpm. Exam Location:  Inpatient Procedure: 2D Echo, Color Doppler and Cardiac Doppler Indications:    Aortic stenosis I35.0  History:        Patient has prior history of Echocardiogram examinations, most                 recent 09/01/2019. Risk Factors:Hypertension.  Sonographer:    Bernadene Person RDCS Referring Phys: [id number] [place]  1. Left ventricular ejection fraction, by estimation, is 60 to 65%. The left ventricle has normal function. The left ventricle has no regional wall motion abnormalities. There is mild left ventricular hypertrophy. Left ventricular diastolic parameters are consistent with Grade I diastolic dysfunction (impaired relaxation).  2. Right  ventricular systolic function is normal. The right ventricular size is normal. Tricuspid regurgitation signal is inadequate for assessing PA pressure.  3. Moderate pleural effusion in the left lateral region.  4. The mitral valve is normal in structure. Mild mitral valve regurgitation. No evidence of mitral stenosis.  5. The aortic valve is tricuspid. Aortic valve regurgitation is trivial. Moderate to severe aortic valve stenosis.  6. The inferior vena cava is normal in size with greater than 50% respiratory variability, suggesting right atrial pressure of 3 mmHg. FINDINGS  Left Ventricle: Left ventricular ejection fraction, by estimation, is 60 to 65%. The left ventricle has normal function. The left ventricle has no regional wall motion abnormalities. The left ventricular internal cavity size was normal in size. There is  mild left ventricular hypertrophy. Left ventricular diastolic parameters are consistent with Grade I diastolic dysfunction (impaired relaxation). Right Ventricle: The right ventricular size is normal.Right ventricular systolic function is normal. Tricuspid regurgitation signal is inadequate for assessing PA pressure. The tricuspid regurgitant velocity is 2.69 m/s, and with an assumed right atrial pressure of 3 mmHg, the estimated right ventricular systolic pressure is 70.0 mmHg. Left Atrium: Left atrial size was normal in size. Right Atrium: Right atrial size was normal in size. Pericardium: There is no evidence of pericardial effusion. Mitral Valve: The mitral valve is normal in structure. Mild mitral annular calcification. Mild mitral valve regurgitation. No evidence of mitral valve stenosis. Tricuspid Valve: The tricuspid valve is normal in structure. Tricuspid valve regurgitation is trivial. No evidence of tricuspid stenosis. Aortic Valve: The aortic valve is tricuspid. Aortic valve regurgitation is trivial. Aortic regurgitation PHT measures 658 msec. Moderate to severe aortic stenosis is  present. Aortic valve mean gradient measures 30.0 mmHg. Aortic valve peak gradient measures 45.5 mmHg. Aortic valve area, by VTI measures 0.68 cm. Pulmonic Valve: The pulmonic valve was normal in structure. Pulmonic valve regurgitation is mild. No evidence of pulmonic stenosis. Aorta: The aortic root is normal in size and structure. Venous: The inferior vena cava is normal in size with greater than 50% respiratory variability, suggesting right atrial pressure of 3 mmHg. IAS/Shunts: No atrial level shunt detected by color flow Doppler. Additional Comments: There is a moderate pleural effusion in the left lateral region.  LEFT VENTRICLE PLAX 2D LVIDd:         3.80 cm LVIDs:         2.20 cm LV PW:         1.20 cm LV IVS:        1.30 cm LVOT diam:     1.90 cm LV SV:         72 LV SV Index:   40 LVOT Area:     2.84 cm  RIGHT VENTRICLE TAPSE (M-mode): 2.0 cm LEFT ATRIUM             Index       RIGHT ATRIUM           Index LA diam:        4.40 cm 2.41 cm/m  RA Area:     15.30 cm LA Vol (A2C):   45.7 ml 25.07 ml/m RA Volume:   37.50 ml  20.57 ml/m LA Vol (A4C):   48.0 ml 26.33 ml/m LA Biplane Vol: 50.8 ml 27.87 ml/m  AORTIC VALVE AV Area (Vmax):    0.66 cm AV Area (Vmean):   0.63 cm AV Area (VTI):     0.68 cm AV Vmax:           337.33 cm/s AV Vmean:          260.667 cm/s AV VTI:            1.062 m AV Peak Grad:      45.5 mmHg AV Mean Grad:      30.0 mmHg LVOT Vmax:         79.03 cm/s LVOT Vmean:        58.267 cm/s LVOT VTI:          0.256 m LVOT/AV VTI ratio: 0.24 AI PHT:            658 msec  AORTA Ao Root diam: 2.80 cm Ao Asc diam:  2.80 cm MR PISA:  0.39 cm TRICUSPID VALVE MR PISA Radius: 0.25 cm  TR Peak grad:   28.9 mmHg                          TR Vmax:        269.00 cm/s                           SHUNTS                          Systemic VTI:  0.26 m                          Systemic Diam: 1.90 cm Kirk Ruths MD Electronically signed by Kirk Ruths MD Signature Date/Time: 02/10/2020/2:55:07 PM     Final      Scheduled Meds: . apraclonidine  1 drop Left Eye TID  . aspirin EC  81 mg Oral Daily  . atorvastatin  10 mg Oral Daily  . furosemide  40 mg Oral Q M,W,F  . heparin  5,000 Units Subcutaneous Q8H  . hydrALAZINE  50 mg Oral BID  . labetalol  100 mg Oral BID  . latanoprost  1 drop Left Eye QHS  . levothyroxine  50 mcg Oral Daily  . loratadine  10 mg Oral QPM  . multivitamin with minerals  1 tablet Oral Daily   Continuous Infusions:   LOS: 1 day    Time spent: 25  Nita Sells, MD [facility] To contact the attending provider between 7A-7P or the covering provider during after hours 7P-7A, please log into the web site [email or web address] and access using universal [place] password for that web site. If you do not have the password, please call the hospital operator.  02/11/2020, 7:16 AM

## 2020-02-11 NOTE — Hospital Course (Signed)
[place]

## 2020-02-11 NOTE — Progress Notes (Addendum)
Progress Note  Patient Name: Alexander Mcdonald Date of Encounter: 02/11/2020  [facility] Cardiologist: Sanda Klein, MD  EP: Dr. Caryl Comes  Subjective   No chest pain, SOB is still present but improved.  Was flat having carotid dopplers on my arrival and no acute SOB.  + productive cough  Inpatient Medications    Scheduled Meds: . apraclonidine  1 drop Left Eye TID  . aspirin EC  81 mg Oral Daily  . atorvastatin  10 mg Oral Daily  . furosemide  40 mg Oral Q M,W,F  . heparin  5,000 Units Subcutaneous Q8H  . hydrALAZINE  50 mg Oral BID  . labetalol  100 mg Oral BID  . latanoprost  1 drop Left Eye QHS  . levothyroxine  50 mcg Oral Daily  . loratadine  10 mg Oral QPM  . multivitamin with minerals  1 tablet Oral Daily   Continuous Infusions:  PRN Meds: acetaminophen **OR** acetaminophen, ondansetron **OR** ondansetron (ZOFRAN) IV, polyethylene glycol   Vital Signs    Vitals:   02/11/20 0330 02/11/20 0418 02/11/20 0538 02/11/20 0713  BP: 122/85 (!) 141/57 (!) 154/80 (!) 186/81  Pulse: (!) 58 61 (!) 58 79  Resp: 18 18 (!) 22 16  Temp:      TempSrc:      SpO2: 95% 95% 97% 99%  Weight:      Height:       No intake or output data in the 24 hours ending 02/11/20 1016 Last 3 Weights 02/10/2020 11/21/2019 10/20/2019  Weight (lbs) 165 lb 153 lb 153 lb 3.2 oz  Weight (kg) 74.844 kg 69.4 kg 69.491 kg      Telemetry    SR with freq PVCs.  - Personally Reviewed  ECG    No new - Personally Reviewed  Physical Exam   GEN: No acute distress.   Neck: No JVD Cardiac: RRR, with premature beats, 3/6 systolic murmur, no rubs, or gallops.  Respiratory: Clear to auscultation bilaterally except for occ wheeze. GI: Soft, nontender, non-distended  MS: No edema; No deformity. Neuro:  Nonfocal  Psych: Normal affect   Labs    High Sensitivity Troponin:   Recent Labs  Lab 02/10/20 0346 02/10/20 1207  TROPONINIHS 17 21*      Chemistry Recent Labs  Lab 02/10/20 0346  02/11/20 0416  NA 140 141  K 4.5 3.7  CL 107 101  CO2 19* 25  GLUCOSE 124* 126*  BUN 48* 48*  CREATININE 2.13* 2.22*  CALCIUM 9.4 9.4  PROT  --  6.6  ALBUMIN  --  3.6  AST  --  29  ALT  --  60*  ALKPHOS  --  139*  BILITOT  --  1.2  GFRNONAA 29* 27*  ANIONGAP 14 15     Hematology Recent Labs  Lab 02/10/20 0346 02/11/20 0416  WBC 10.0 10.1  RBC 3.87* 4.13*  HGB 12.0* 12.6*  HCT 37.1* 38.5*  MCV 95.9 93.2  MCH 31.0 30.5  MCHC 32.3 32.7  RDW 14.5 14.2  PLT 239 248    BNP Recent Labs  Lab 02/10/20 0346  BNP 1,699.8*     DDimer No results for input(s): DDIMER in the last 168 hours.   Radiology    DG Chest 2 View  Result Date: 02/10/2020 CLINICAL DATA:  Shortness of breath. EXAM: CHEST - 2 VIEW COMPARISON:  10/30/2019. FINDINGS: Mediastinum and hilar structures normal. Heart size normal. Diffuse bilateral pulmonary infiltrates/edema. Small bilateral pleural effusions.  No pneumothorax. Ankylosis thoracic spine. IMPRESSION: Diffuse bilateral pulmonary infiltrates/edema. Small bilateral pleural effusions. Electronically Signed   By: Marcello Moores  Register   On: 02/10/2020 05:33   ECHOCARDIOGRAM COMPLETE  Result Date: 02/10/2020    ECHOCARDIOGRAM REPORT   Patient Name:   Alexander Mcdonald Date of Exam: 02/10/2020 Medical Rec #:  [id number]       Height:       65.0 in Accession #:    [id number]      Weight:       165.0 lb Date of Birth:  [birth year]        BSA:          1.823 m Patient Age:    11 years        BP:           155/67 mmHg Patient Gender: M               HR:           67 bpm. Exam Location:  Inpatient Procedure: 2D Echo, Color Doppler and Cardiac Doppler Indications:    Aortic stenosis I35.0  History:        Patient has prior history of Echocardiogram examinations, most                 recent 09/01/2019. Risk Factors:Hypertension.  Sonographer:    Bernadene Person RDCS Referring Phys: [id number] [place]  1. Left ventricular ejection fraction, by  estimation, is 60 to 65%. The left ventricle has normal function. The left ventricle has no regional wall motion abnormalities. There is mild left ventricular hypertrophy. Left ventricular diastolic parameters are consistent with Grade I diastolic dysfunction (impaired relaxation).  2. Right ventricular systolic function is normal. The right ventricular size is normal. Tricuspid regurgitation signal is inadequate for assessing PA pressure.  3. Moderate pleural effusion in the left lateral region.  4. The mitral valve is normal in structure. Mild mitral valve regurgitation. No evidence of mitral stenosis.  5. The aortic valve is tricuspid. Aortic valve regurgitation is trivial. Moderate to severe aortic valve stenosis.  6. The inferior vena cava is normal in size with greater than 50% respiratory variability, suggesting right atrial pressure of 3 mmHg. FINDINGS  Left Ventricle: Left ventricular ejection fraction, by estimation, is 60 to 65%. The left ventricle has normal function. The left ventricle has no regional wall motion abnormalities. The left ventricular internal cavity size was normal in size. There is  mild left ventricular hypertrophy. Left ventricular diastolic parameters are consistent with Grade I diastolic dysfunction (impaired relaxation). Right Ventricle: The right ventricular size is normal.Right ventricular systolic function is normal. Tricuspid regurgitation signal is inadequate for assessing PA pressure. The tricuspid regurgitant velocity is 2.69 m/s, and with an assumed right atrial pressure of 3 mmHg, the estimated right ventricular systolic pressure is 97.3 mmHg. Left Atrium: Left atrial size was normal in size. Right Atrium: Right atrial size was normal in size. Pericardium: There is no evidence of pericardial effusion. Mitral Valve: The mitral valve is normal in structure. Mild mitral annular calcification. Mild mitral valve regurgitation. No evidence of mitral valve stenosis. Tricuspid  Valve: The tricuspid valve is normal in structure. Tricuspid valve regurgitation is trivial. No evidence of tricuspid stenosis. Aortic Valve: The aortic valve is tricuspid. Aortic valve regurgitation is trivial. Aortic regurgitation PHT measures 658 msec. Moderate to severe aortic stenosis is present. Aortic valve mean gradient measures 30.0 mmHg. Aortic valve peak gradient measures 45.5 mmHg. Aortic valve  area, by VTI measures 0.68 cm. Pulmonic Valve: The pulmonic valve was normal in structure. Pulmonic valve regurgitation is mild. No evidence of pulmonic stenosis. Aorta: The aortic root is normal in size and structure. Venous: The inferior vena cava is normal in size with greater than 50% respiratory variability, suggesting right atrial pressure of 3 mmHg. IAS/Shunts: No atrial level shunt detected by color flow Doppler. Additional Comments: There is a moderate pleural effusion in the left lateral region.  LEFT VENTRICLE PLAX 2D LVIDd:         3.80 cm LVIDs:         2.20 cm LV PW:         1.20 cm LV IVS:        1.30 cm LVOT diam:     1.90 cm LV SV:         72 LV SV Index:   40 LVOT Area:     2.84 cm  RIGHT VENTRICLE TAPSE (M-mode): 2.0 cm LEFT ATRIUM             Index       RIGHT ATRIUM           Index LA diam:        4.40 cm 2.41 cm/m  RA Area:     15.30 cm LA Vol (A2C):   45.7 ml 25.07 ml/m RA Volume:   37.50 ml  20.57 ml/m LA Vol (A4C):   48.0 ml 26.33 ml/m LA Biplane Vol: 50.8 ml 27.87 ml/m  AORTIC VALVE AV Area (Vmax):    0.66 cm AV Area (Vmean):   0.63 cm AV Area (VTI):     0.68 cm AV Vmax:           337.33 cm/s AV Vmean:          260.667 cm/s AV VTI:            1.062 m AV Peak Grad:      45.5 mmHg AV Mean Grad:      30.0 mmHg LVOT Vmax:         79.03 cm/s LVOT Vmean:        58.267 cm/s LVOT VTI:          0.256 m LVOT/AV VTI ratio: 0.24 AI PHT:            658 msec  AORTA Ao Root diam: 2.80 cm Ao Asc diam:  2.80 cm MR PISA:        0.39 cm TRICUSPID VALVE MR PISA Radius: 0.25 cm  TR Peak grad:    28.9 mmHg                          TR Vmax:        269.00 cm/s                           SHUNTS                          Systemic VTI:  0.26 m                          Systemic Diam: 1.90 cm Kirk Ruths MD Electronically signed by Kirk Ruths MD Signature Date/Time: 02/10/2020/2:55:07 PM    Final     Cardiac Studies   Echo 02/10/20 IMPRESSIONS    1. Left ventricular  ejection fraction, by estimation, is 60 to 65%. The  left ventricle has normal function. The left ventricle has no regional  wall motion abnormalities. There is mild left ventricular hypertrophy.  Left ventricular diastolic parameters  are consistent with Grade I diastolic dysfunction (impaired relaxation).  2. Right ventricular systolic function is normal. The right ventricular  size is normal. Tricuspid regurgitation signal is inadequate for assessing  PA pressure.  3. Moderate pleural effusion in the left lateral region.  4. The mitral valve is normal in structure. Mild mitral valve  regurgitation. No evidence of mitral stenosis.  5. The aortic valve is tricuspid. Aortic valve regurgitation is trivial.  Moderate to severe aortic valve stenosis.  6. The inferior vena cava is normal in size with greater than 50%  respiratory variability, suggesting right atrial pressure of 3 mmHg.   FINDINGS  Left Ventricle: Left ventricular ejection fraction, by estimation, is 60  to 65%. The left ventricle has normal function. The left ventricle has no  regional wall motion abnormalities. The left ventricular internal cavity  size was normal in size. There is  mild left ventricular hypertrophy. Left ventricular diastolic parameters  are consistent with Grade I diastolic dysfunction (impaired relaxation).   Right Ventricle: The right ventricular size is normal.Right ventricular  systolic function is normal. Tricuspid regurgitation signal is inadequate  for assessing PA pressure. The tricuspid regurgitant velocity is 2.69  m/s,  and with an assumed right atrial  pressure of 3 mmHg, the estimated right ventricular systolic pressure is  44.3 mmHg.   Left Atrium: Left atrial size was normal in size.   Right Atrium: Right atrial size was normal in size.   Pericardium: There is no evidence of pericardial effusion.   Mitral Valve: The mitral valve is normal in structure. Mild mitral annular  calcification. Mild mitral valve regurgitation. No evidence of mitral  valve stenosis.   Tricuspid Valve: The tricuspid valve is normal in structure. Tricuspid  valve regurgitation is trivial. No evidence of tricuspid stenosis.   Aortic Valve: The aortic valve is tricuspid. Aortic valve regurgitation is  trivial. Aortic regurgitation PHT measures 658 msec. Moderate to severe  aortic stenosis is present. Aortic valve mean gradient measures 30.0 mmHg.  Aortic valve peak gradient  measures 45.5 mmHg. Aortic valve area, by VTI measures 0.68 cm.   Pulmonic Valve: The pulmonic valve was normal in structure. Pulmonic valve  regurgitation is mild. No evidence of pulmonic stenosis.   Aorta: The aortic root is normal in size and structure.   Venous: The inferior vena cava is normal in size with greater than 50%  respiratory variability, suggesting right atrial pressure of 3 mmHg.   IAS/Shunts: No atrial level shunt detected by color flow Doppler.   Additional Comments: There is a moderate pleural effusion in the left  lateral region.     LEFT VENTRICLE  PLAX 2D  LVIDd:     3.80 cm  LVIDs:     2.20 cm  LV PW:     1.20 cm  LV IVS:    1.30 cm  LVOT diam:   1.90 cm  LV SV:     72  LV SV Index:  40  LVOT Area:   2.84 cm     RIGHT VENTRICLE  TAPSE (M-mode): 2.0 cm   LEFT ATRIUM       Index    RIGHT ATRIUM      Index  LA diam:    4.40 cm 2.41 cm/m RA Area:  15.30 cm  LA Vol (A2C):  45.7 ml 25.07 ml/m RA Volume:  37.50 ml 20.57 ml/m  LA Vol (A4C):  48.0 ml  26.33 ml/m  LA Biplane Vol: 50.8 ml 27.87 ml/m  AORTIC VALVE  AV Area (Vmax):  0.66 cm  AV Area (Vmean):  0.63 cm  AV Area (VTI):   0.68 cm  AV Vmax:      337.33 cm/s  AV Vmean:     260.667 cm/s  AV VTI:      1.062 m  AV Peak Grad:   45.5 mmHg  AV Mean Grad:   30.0 mmHg  LVOT Vmax:     79.03 cm/s  LVOT Vmean:    58.267 cm/s  LVOT VTI:     0.256 m  LVOT/AV VTI ratio: 0.24  AI PHT:      658 msec    AORTA  Ao Root diam: 2.80 cm  Ao Asc diam: 2.80 cm   MR PISA:    0.39 cm TRICUSPID VALVE  MR PISA Radius: 0.25 cm TR Peak grad:  28.9 mmHg              TR Vmax:    269.00 cm/s                SHUNTS              Systemic VTI: 0.26 m              Systemic Diam: 1.90 cm   Patient Profile     87 y.o. male with a PMH of moderate-severe aortic stenosis, HTN, HLD, BPH, chronic cystitis with recurrent UTI's, prostate cancer, and CKD stage 3b-4, now with CHF and critical AS.   Assessment & Plan    1. Acute diastolic CHF: patient presented with acute SOB and hypoxic.  - now on lasix 40 mg po daily  - echocardiogram with critical AS- plan for rt and Lt cardiac cath tomorrow.  Have contacted structural heart team. Continue to monitor strict I&Os and daily weights.  Pt still in ER and no I&O and no wts.  - Continue to monitor electrolytes closely and replete as needed to maintain K >4, Mg >2 - Continue BBlocker  2. Aortic stenosis: moderate to severe on last echo 08/2019 with mean gradient 30.34mmHg and peak gradient 45.5 mmHg. VTI 0.68 cm Possible progression of his AS has contributed to #1-  -=discussed cardiac cath Rt and lt. For tomorrow, pt could be flat, he wants [place] to discuss with daughter before he agrees.  3. HTN: BP markedly elevated on EMS arrival, improved with SL nitro though remains high.  186/81 Management has been limited by CKD and trouble with LE edema on  amlodipine. Current regimen includes labetalol 100mg  BID and hydralazine increased to 50 mg BID - Continue labetalol and hydralazine for now at new doses  4. HLD: no recent lipids - Continue statin      For questions or updates, please contact [place] Please consult [email or web address] for contact info under        Signed, Cecilie Kicks, NP  02/11/2020, 10:16 AM    I have seen and examined the patient along with Cecilie Kicks, NP .  I have reviewed the chart, notes and new data.  I agree with PA/NP's note.  Key new complaints: Reports still having some dyspnea, but he is able to lie completely flat in bed without appearing uncomfortable.  Denies chest pain. Key examination changes: Clear lungs, no JVD,  regular rate and rhythm with frequent ectopy, late peaking 3/6 aortic ejection murmur with a very muffled barely audible S2, no edema Key new findings / data: Echo reviewed and findings are consistent with severe aortic stenosis.  No evidence of significant carotid disease on ultrasound preliminary report.  On telemetry has frequent PVCs, rare couplets or 3 beat runs of nonsustained VT, nothing more complex.  PLAN: Discussed right and left heart catheterization in detail with the patient and his daughter.  I think it would be safer for him to have it done now, as an inpatient, since we will be able to more carefully monitor his renal function and provide appropriate hydration.  Plan on a diagnostic-only study, using the minimum necessary amount of contrast.  If PCI is necessary this should be done as a staged intervention to protect kidney function.  Nephrotoxicity would be the most concerning possible complication in this particular patient.  This procedure has been fully reviewed with the patient and written informed consent has been obtained.  He has also been tentatively scheduled for evaluation by Dr. Darlina Guys in the structural heart clinic on February 2, to review candidacy for  TAVR.  Blood pressure control is markedly improved, transiently high when he was discussing the cardiac catheterization and was a little distressed.   Sanda Klein, MD, [place] [phone] 02/11/2020, 2:32 PM

## 2020-02-11 NOTE — ED Notes (Signed)
Pt's daughter at bedside asking to speak with cardiology PA that came in and saw patient earlier while she wasn't here, as pt telling her that he was supposed to be having some serious surgery tomorrow and now pt asking her for last rights. Daughter was under impression from hospitalist this AM that pt would be fine and be staying here for 3-4 days.

## 2020-02-11 NOTE — Progress Notes (Signed)
Carotid artery duplex has been completed. Preliminary results can be found in CV Proc through chart review.   02/11/20 12:37 PM Alexander Mcdonald RVT

## 2020-02-12 DIAGNOSIS — J810 Acute pulmonary edema: Secondary | ICD-10-CM | POA: Diagnosis not present

## 2020-02-12 DIAGNOSIS — I350 Nonrheumatic aortic (valve) stenosis: Secondary | ICD-10-CM | POA: Diagnosis not present

## 2020-02-12 DIAGNOSIS — I5033 Acute on chronic diastolic (congestive) heart failure: Secondary | ICD-10-CM | POA: Diagnosis not present

## 2020-02-12 LAB — CBC
HCT: 39 % (ref 39.0–52.0)
Hemoglobin: 12.7 g/dL — ABNORMAL LOW (ref 13.0–17.0)
MCH: 30.2 pg (ref 26.0–34.0)
MCHC: 32.6 g/dL (ref 30.0–36.0)
MCV: 92.9 fL (ref 80.0–100.0)
Platelets: 256 10*3/uL (ref 150–400)
RBC: 4.2 MIL/uL — ABNORMAL LOW (ref 4.22–5.81)
RDW: 14.1 % (ref 11.5–15.5)
WBC: 9.5 10*3/uL (ref 4.0–10.5)
nRBC: 0 % (ref 0.0–0.2)

## 2020-02-12 LAB — PROTIME-INR
INR: 1.1 (ref 0.8–1.2)
Prothrombin Time: 13.9 seconds (ref 11.4–15.2)

## 2020-02-12 LAB — COMPREHENSIVE METABOLIC PANEL
ALT: 45 U/L — ABNORMAL HIGH (ref 0–44)
AST: 28 U/L (ref 15–41)
Albumin: 3.4 g/dL — ABNORMAL LOW (ref 3.5–5.0)
Alkaline Phosphatase: 112 U/L (ref 38–126)
Anion gap: 14 (ref 5–15)
BUN: 54 mg/dL — ABNORMAL HIGH (ref 8–23)
CO2: 24 mmol/L (ref 22–32)
Calcium: 9.4 mg/dL (ref 8.9–10.3)
Chloride: 101 mmol/L (ref 98–111)
Creatinine, Ser: 2.52 mg/dL — ABNORMAL HIGH (ref 0.61–1.24)
GFR, Estimated: 23 mL/min — ABNORMAL LOW (ref >60)
Glucose, Bld: 114 mg/dL — ABNORMAL HIGH (ref 70–99)
Potassium: 3.6 mmol/L (ref 3.5–5.1)
Sodium: 139 mmol/L (ref 135–145)
Total Bilirubin: 1.2 mg/dL (ref 0.3–1.2)
Total Protein: 6.3 g/dL — ABNORMAL LOW (ref 6.5–8.1)

## 2020-02-12 MED ORDER — SODIUM CHLORIDE 0.9% FLUSH: 3.0000 mL | INTRAVENOUS | Status: DC | PRN

## 2020-02-12 MED ORDER — SODIUM CHLORIDE 0.9 % IV SOLN: 250.0000 mL | INTRAVENOUS | Status: DC | PRN

## 2020-02-12 MED ORDER — ASPIRIN 81 MG PO CHEW
81.0000 mg | CHEWABLE_TABLET | ORAL | Status: AC
  Administered 2020-02-13: 81 mg via ORAL
  Filled 2020-02-12: qty 1

## 2020-02-12 MED ORDER — SODIUM CHLORIDE 0.9 % IV SOLN: INTRAVENOUS | Status: DC

## 2020-02-12 MED ORDER — SODIUM CHLORIDE 0.9% FLUSH
3.0000 mL | Freq: Two times a day (BID) | INTRAVENOUS | Status: DC
  Administered 2020-02-13: 3 mL via INTRAVENOUS

## 2020-02-12 NOTE — Progress Notes (Signed)
Holding cath for today, with elevated Cr.  Will give IV fluids today and allow diet.

## 2020-02-12 NOTE — Progress Notes (Signed)
PROGRESS NOTE   Alexander Mcdonald  MRN:[id number] DOB: 12/15/1926 DOA: 02/10/2020 PCP: Hernandez Acosta, Estela Y, MD  Brief Narrative:  87-year-old home dwelling white male-ambulates with walker HTN HLD hypothyroid BPH/prostate cancer conservatively managed Carotid stenosis status post left CEA macular degeneration CKD 3 baseline creatinine 2.2 Moderate to severe aortic stenosis Prior GI bleed 06/2017 EGD showed severe esophagitis duodenal ulcer at the time  Patient admitted hypoxic 84% on room air stating dyspnea blood pressure found to be elevated Work-up showed BNP 1699 troponin XVII BUN/creatinine 48/2.1 CXR pulmonary infiltrate  Found to have heart failure and cardiology consulted  Assessment & Plan:   Active Problems:   HYPERCHOLESTEROLEMIA   Essential hypertension   PROSTATE CANCER, HX OF   (HFpEF) heart failure with preserved ejection fraction (HCC)   Acute on chronic respiratory failure with hypoxia (HCC)   Hypoxia   CHF (congestive heart failure) (HCC)   1. Acute superimposed on HFpEF-this admission EF 60-65% a. New onset likely exacerbated by AOS b. Diuretics held because of rising creatinine giving fluids c. Continue labetalol 100 twice daily, hydralazine 50 twice daily 2. Aortic stenosis/PVC/functional bradycardia a. Aortic stenosis seems to vary based on echo although seems to have a poor functional baseline b. Likely will need cardiac cath to determine hemodynamics c. Defer to cardiology if candidate for TAVR 3. HTN  4. AKI superimposed on CKD 3 a. Creatinine has worsened over the past 24 hours so stopping diuretics 1/27 giving fluids and monitor b. Not a candidate for ACE 5. Prostate cancer/BPH a. Outpatient monitoring and management as per urologist/oncologist 6. Carotid stenosis in the past a. Continue aspirin 81, atorvastatin 10 7. Hypothyroid a. Continue Synthroid 50 8. Prior GI bleed a. Monitor trends  DVT prophylaxis: Lovenox Code Status:  Full Family Communication: Talked to [name] [phone] --and discussed with her in person Disposition:  Status is: Inpatient  Remains inpatient appropriate because:Hemodynamically unstable, Persistent severe electrolyte disturbances and Ongoing diagnostic testing needed not appropriate for outpatient work up   Dispo: The patient is from: Home              Anticipated d/c is to: Home              Anticipated d/c date is: 2 days              Patient currently is not medically stable to d/c.   Difficult to place patient No       Consultants:   Cardiology  Procedures: Echo as above  Antimicrobials: None   Subjective: Coherent patient seen walking in the halls looking quite comfortable no chest pain no fever Met with daughter at bedside Aware of planning and then discontinuation cardiac cath to determine if can get TAVR  Objective: Vitals:   02/12/20 0024 02/12/20 0425 02/12/20 0529 02/12/20 1046  BP: (!) 108/40 (!) 138/54    Pulse: 61 (!) 50  79  Resp: 18 14    Temp: 98.3 F (36.8 C) 98.2 F (36.8 C)    TempSrc: Oral Oral    SpO2: 90% 95%    Weight:   64.6 kg   Height:        Intake/Output Summary (Last 24 hours) at 02/12/2020 1050 Last data filed at 02/12/2020 0600 Gross per 24 hour  Intake 211.38 ml  Output 850 ml  Net -638.62 ml   Filed Weights   02/10/20 0343 02/11/20 1719 02/12/20 0529  Weight: 74.8 kg 74.8 kg 64.6 kg    Examination:    EOMI NCAT no focal deficit CTA B no rales rhonchi JVD flat Abdomen soft nontender No lower extremity edema ROM intact Psych euthymic no focal deficit  Data Reviewed: I have personally reviewed following labs and imaging studies Baseline BUN/creatinine 33/2.4-->54/2.5 Potassium 3.6 ALT 45 White count 9.5, hemoglobin 12.7  Echocardiogram 1/25 IMPRESSIONS    1. Left ventricular ejection fraction, by estimation, is 60 to 65%. The  left ventricle has normal function. The left ventricle has no regional  wall  motion abnormalities. There is mild left ventricular hypertrophy.  Left ventricular diastolic parameters  are consistent with Grade I diastolic dysfunction (impaired relaxation).  2. Right ventricular systolic function is normal. The right ventricular  size is normal. Tricuspid regurgitation signal is inadequate for assessing  PA pressure.  3. Moderate pleural effusion in the left lateral region.  4. The mitral valve is normal in structure. Mild mitral valve  regurgitation. No evidence of mitral stenosis.  5. The aortic valve is tricuspid. Aortic valve regurgitation is trivial.  Moderate to severe aortic valve stenosis.  6. The inferior vena cava is normal in size with greater than 50%  respiratory variability, suggesting right atrial pressure of 3 mmHg.   Radiology Studies: ECHOCARDIOGRAM COMPLETE  Result Date: 02/10/2020    ECHOCARDIOGRAM REPORT   Patient Name:   Alexander Mcdonald Date of Exam: 02/10/2020 Medical Rec #:  [id number]       Height:       65.0 in Accession #:    [id number]      Weight:       165.0 lb Date of Birth:  05/09/1926        BSA:          1.823 m Patient Age:    87 years        BP:           155/67 mmHg Patient Gender: M               HR:           67 bpm. Exam Location:  Inpatient Procedure: 2D Echo, Color Doppler and Cardiac Doppler Indications:    Aortic stenosis I35.0  History:        Patient has prior history of Echocardiogram examinations, most                 recent 09/01/2019. Risk Factors:Hypertension.  Sonographer:    Sarah Pirrotta RDCS Referring Phys: [id number] MOHAMMADTOKIR MUJTABA IMPRESSIONS  1. Left ventricular ejection fraction, by estimation, is 60 to 65%. The left ventricle has normal function. The left ventricle has no regional wall motion abnormalities. There is mild left ventricular hypertrophy. Left ventricular diastolic parameters are consistent with Grade I diastolic dysfunction (impaired relaxation).  2. Right ventricular systolic function is normal.  The right ventricular size is normal. Tricuspid regurgitation signal is inadequate for assessing PA pressure.  3. Moderate pleural effusion in the left lateral region.  4. The mitral valve is normal in structure. Mild mitral valve regurgitation. No evidence of mitral stenosis.  5. The aortic valve is tricuspid. Aortic valve regurgitation is trivial. Moderate to severe aortic valve stenosis.  6. The inferior vena cava is normal in size with greater than 50% respiratory variability, suggesting right atrial pressure of 3 mmHg. FINDINGS  Left Ventricle: Left ventricular ejection fraction, by estimation, is 60 to 65%. The left ventricle has normal function. The left ventricle has no regional wall motion abnormalities. The left ventricular internal cavity size was normal in   size. There is  mild left ventricular hypertrophy. Left ventricular diastolic parameters are consistent with Grade I diastolic dysfunction (impaired relaxation). Right Ventricle: The right ventricular size is normal.Right ventricular systolic function is normal. Tricuspid regurgitation signal is inadequate for assessing PA pressure. The tricuspid regurgitant velocity is 2.69 m/s, and with an assumed right atrial pressure of 3 mmHg, the estimated right ventricular systolic pressure is 31.9 mmHg. Left Atrium: Left atrial size was normal in size. Right Atrium: Right atrial size was normal in size. Pericardium: There is no evidence of pericardial effusion. Mitral Valve: The mitral valve is normal in structure. Mild mitral annular calcification. Mild mitral valve regurgitation. No evidence of mitral valve stenosis. Tricuspid Valve: The tricuspid valve is normal in structure. Tricuspid valve regurgitation is trivial. No evidence of tricuspid stenosis. Aortic Valve: The aortic valve is tricuspid. Aortic valve regurgitation is trivial. Aortic regurgitation PHT measures 658 msec. Moderate to severe aortic stenosis is present. Aortic valve mean gradient measures  30.0 mmHg. Aortic valve peak gradient measures 45.5 mmHg. Aortic valve area, by VTI measures 0.68 cm. Pulmonic Valve: The pulmonic valve was normal in structure. Pulmonic valve regurgitation is mild. No evidence of pulmonic stenosis. Aorta: The aortic root is normal in size and structure. Venous: The inferior vena cava is normal in size with greater than 50% respiratory variability, suggesting right atrial pressure of 3 mmHg. IAS/Shunts: No atrial level shunt detected by color flow Doppler. Additional Comments: There is a moderate pleural effusion in the left lateral region.  LEFT VENTRICLE PLAX 2D LVIDd:         3.80 cm LVIDs:         2.20 cm LV PW:         1.20 cm LV IVS:        1.30 cm LVOT diam:     1.90 cm LV SV:         72 LV SV Index:   40 LVOT Area:     2.84 cm  RIGHT VENTRICLE TAPSE (M-mode): 2.0 cm LEFT ATRIUM             Index       RIGHT ATRIUM           Index LA diam:        4.40 cm 2.41 cm/m  RA Area:     15.30 cm LA Vol (A2C):   45.7 ml 25.07 ml/m RA Volume:   37.50 ml  20.57 ml/m LA Vol (A4C):   48.0 ml 26.33 ml/m LA Biplane Vol: 50.8 ml 27.87 ml/m  AORTIC VALVE AV Area (Vmax):    0.66 cm AV Area (Vmean):   0.63 cm AV Area (VTI):     0.68 cm AV Vmax:           337.33 cm/s AV Vmean:          260.667 cm/s AV VTI:            1.062 m AV Peak Grad:      45.5 mmHg AV Mean Grad:      30.0 mmHg LVOT Vmax:         79.03 cm/s LVOT Vmean:        58.267 cm/s LVOT VTI:          0.256 m LVOT/AV VTI ratio: 0.24 AI PHT:            658 msec  AORTA Ao Root diam: 2.80 cm Ao Asc diam:  2.80 cm MR PISA:          0.39 cm TRICUSPID VALVE MR PISA Radius: 0.25 cm  TR Peak grad:   28.9 mmHg                          TR Vmax:        269.00 cm/s                           SHUNTS                          Systemic VTI:  0.26 m                          Systemic Diam: 1.90 cm Kirk Ruths MD Electronically signed by Kirk Ruths MD Signature Date/Time: 02/10/2020/2:55:07 PM    Final    VAS US CAROTID  Result Date:  02/11/2020 Carotid Arterial Duplex Study Indications:       Pre-TAVR. Risk Factors:      Hypertension. Comparison Study:  No prior studies. Performing Technologist: Oliver Hum RVT  Examination Guidelines: A complete evaluation includes B-mode imaging, spectral Doppler, color Doppler, and power Doppler as needed of all accessible portions of each vessel. Bilateral testing is considered an integral part of a complete examination. Limited examinations for reoccurring indications may be performed as noted.  Right Carotid Findings: +----------+--------+--------+--------+-----------------------+--------+           PSV cm/sEDV cm/sStenosisPlaque Description     Comments +----------+--------+--------+--------+-----------------------+--------+ CCA Prox  46      3               smooth and heterogenous         +----------+--------+--------+--------+-----------------------+--------+ CCA Distal55      7               smooth and heterogenous         +----------+--------+--------+--------+-----------------------+--------+ ICA Prox  85      8               calcific                        +----------+--------+--------+--------+-----------------------+--------+ ICA Distal118     12                                     tortuous +----------+--------+--------+--------+-----------------------+--------+ ECA       148     1                                               +----------+--------+--------+--------+-----------------------+--------+ +----------+--------+-------+--------+-------------------+           PSV cm/sEDV cmsDescribeArm Pressure (mmHG) +----------+--------+-------+--------+-------------------+ [id number]                                        +----------+--------+-------+--------+-------------------+ +---------+--------+--+--------+--+---------+ VertebralPSV cm/s42EDV cm/s12Antegrade +---------+--------+--+--------+--+---------+  Left Carotid Findings:  +----------+--------+--------+--------+--------------------------+--------+           PSV cm/sEDV cm/sStenosisPlaque Description        Comments +----------+--------+--------+--------+--------------------------+--------+ CCA Prox  86      7  smooth and heterogenous            +----------+--------+--------+--------+--------------------------+--------+ CCA Distal79      10              smooth and heterogenous            +----------+--------+--------+--------+--------------------------+--------+ ICA Prox  113     20              irregular and heterogenous         +----------+--------+--------+--------+--------------------------+--------+ ICA Distal96      20                                        tortuous +----------+--------+--------+--------+--------------------------+--------+ ECA       90                                                         +----------+--------+--------+--------+--------------------------+--------+ +----------+--------+--------+--------+-------------------+           PSV cm/sEDV cm/sDescribeArm Pressure (mmHG) +----------+--------+--------+--------+-------------------+ Subclavian102                                         +----------+--------+--------+--------+-------------------+ +---------+--------+--+--------+--+---------+ VertebralPSV cm/s64EDV cm/s14Antegrade +---------+--------+--+--------+--+---------+   Summary: Right Carotid: Velocities in the right ICA are consistent with a 1-39% stenosis. Left Carotid: Velocities in the left ICA are consistent with a 1-39% stenosis. Vertebrals: Bilateral vertebral arteries demonstrate antegrade flow. *See table(s) above for measurements and observations.  Electronically signed by Vance Brabham MD on 02/11/2020 at 9:40:11 PM.    Final      Scheduled Meds: . apraclonidine  1 drop Left Eye TID  . [START ON 02/13/2020] aspirin EC  81 mg Oral Daily  . atorvastatin  10 mg Oral  Daily  . furosemide  40 mg Oral Q M,W,F  . heparin  5,000 Units Subcutaneous Q8H  . hydrALAZINE  50 mg Oral BID  . labetalol  100 mg Oral BID  . latanoprost  1 drop Left Eye QHS  . levothyroxine  50 mcg Oral Daily  . loratadine  10 mg Oral QPM  . multivitamin with minerals  1 tablet Oral Daily  . sodium chloride flush  3 mL Intravenous Q12H   Continuous Infusions: . sodium chloride    . sodium chloride 50 mL/hr at 02/12/20 0410     LOS: 2 days    Time spent: 35  Jai-Gurmukh , MD [facility] To contact the attending provider between 7A-7P or the covering provider during after hours 7P-7A, please log into the web site [email or web address] and access using universal [place] password for that web site. If you do not have the password, please call the hospital operator.  02/12/2020, 10:50 AM    

## 2020-02-12 NOTE — Plan of Care (Signed)

## 2020-02-12 NOTE — Progress Notes (Addendum)
Progress Note  Patient Name: Alexander Mcdonald Date of Encounter: 02/12/2020  [facility] Cardiologist: Sanda Klein, MD   Subjective   No chest pain no SOB, off 02.  Sitting up in bed, feels well  Discussed cancelling cath today.  His daughter is in the room -Pt brighter today    Inpatient Medications    Scheduled Meds: . apraclonidine  1 drop Left Eye TID  . [START ON 02/13/2020] aspirin EC  81 mg Oral Daily  . atorvastatin  10 mg Oral Daily  . furosemide  40 mg Oral Q M,W,F  . heparin  5,000 Units Subcutaneous Q8H  . hydrALAZINE  50 mg Oral BID  . labetalol  100 mg Oral BID  . latanoprost  1 drop Left Eye QHS  . levothyroxine  50 mcg Oral Daily  . loratadine  10 mg Oral QPM  . multivitamin with minerals  1 tablet Oral Daily  . sodium chloride flush  3 mL Intravenous Q12H   Continuous Infusions: . sodium chloride    . sodium chloride 50 mL/hr at 02/12/20 0410   PRN Meds: sodium chloride, acetaminophen **OR** acetaminophen, ondansetron **OR** ondansetron (ZOFRAN) IV, polyethylene glycol, sodium chloride flush   Vital Signs    Vitals:   02/11/20 2227 02/12/20 0024 02/12/20 0425 02/12/20 0529  BP: (!) 160/61 (!) 108/40 (!) 138/54   Pulse: 77 61 (!) 50   Resp: 18 18 14    Temp:  98.3 F (36.8 C) 98.2 F (36.8 C)   TempSrc:  Oral Oral   SpO2:  90% 95%   Weight:    64.6 kg  Height:        Intake/Output Summary (Last 24 hours) at 02/12/2020 0926 Last data filed at 02/12/2020 0600 Gross per 24 hour  Intake 211.38 ml  Output 850 ml  Net -638.62 ml   Last 3 Weights 02/12/2020 02/11/2020 02/10/2020  Weight (lbs) 142 lb 6.7 oz 164 lb 14.5 oz 165 lb  Weight (kg) 64.6 kg 74.8 kg 74.844 kg      Telemetry    SR with PVCs - Personally Reviewed  ECG    SR with big. PVCs  RAD now acute changes - Personally Reviewed  Physical Exam   GEN: No acute distress.  --pt is blind Neck: No JVD sitting up in bed Cardiac: RRR, but sound slow due to PVCs, 3/6 systolic  murmur, no rubs, or gallops.  Respiratory: Clear to auscultation bilaterally. GI: Soft, nontender, non-distended  MS: No edema; No deformity. Neuro:  Nonfocal  Psych: Normal affect   Labs    High Sensitivity Troponin:   Recent Labs  Lab 02/10/20 0346 02/10/20 1207  TROPONINIHS 17 21*      Chemistry Recent Labs  Lab 02/10/20 0346 02/11/20 0416 02/12/20 0530  NA 140 141 139  K 4.5 3.7 3.6  CL 107 101 101  CO2 19* 25 24  GLUCOSE 124* 126* 114*  BUN 48* 48* 54*  CREATININE 2.13* 2.22* 2.52*  CALCIUM 9.4 9.4 9.4  PROT  --  6.6 6.3*  ALBUMIN  --  3.6 3.4*  AST  --  29 28  ALT  --  60* 45*  ALKPHOS  --  139* 112  BILITOT  --  1.2 1.2  GFRNONAA 29* 27* 23*  ANIONGAP 14 15 14      Hematology Recent Labs  Lab 02/10/20 0346 02/11/20 0416 02/12/20 0530  WBC 10.0 10.1 9.5  RBC 3.87* 4.13* 4.20*  HGB 12.0* 12.6* 12.7*  HCT 37.1* 38.5* 39.0  MCV 95.9 93.2 92.9  MCH 31.0 30.5 30.2  MCHC 32.3 32.7 32.6  RDW 14.5 14.2 14.1  PLT 239 248 256    BNP Recent Labs  Lab 02/10/20 0346  BNP 1,699.8*     DDimer No results for input(s): DDIMER in the last 168 hours.   Radiology    ECHOCARDIOGRAM COMPLETE  Result Date: 02/10/2020    ECHOCARDIOGRAM REPORT   Patient Name:   Alexander Mcdonald Date of Exam: 02/10/2020 Medical Rec #:  [id number]       Height:       65.0 in Accession #:    [id number]      Weight:       165.0 lb Date of Birth:  1926-04-27        BSA:          1.823 m Patient Age:    16 years        BP:           155/67 mmHg Patient Gender: M               HR:           67 bpm. Exam Location:  Inpatient Procedure: 2D Echo, Color Doppler and Cardiac Doppler Indications:    Aortic stenosis I35.0  History:        Patient has prior history of Echocardiogram examinations, most                 recent 09/01/2019. Risk Factors:Hypertension.  Sonographer:    Bernadene Person RDCS Referring Phys: [id number] [place]  1. Left ventricular ejection fraction, by  estimation, is 60 to 65%. The left ventricle has normal function. The left ventricle has no regional wall motion abnormalities. There is mild left ventricular hypertrophy. Left ventricular diastolic parameters are consistent with Grade I diastolic dysfunction (impaired relaxation).  2. Right ventricular systolic function is normal. The right ventricular size is normal. Tricuspid regurgitation signal is inadequate for assessing PA pressure.  3. Moderate pleural effusion in the left lateral region.  4. The mitral valve is normal in structure. Mild mitral valve regurgitation. No evidence of mitral stenosis.  5. The aortic valve is tricuspid. Aortic valve regurgitation is trivial. Moderate to severe aortic valve stenosis.  6. The inferior vena cava is normal in size with greater than 50% respiratory variability, suggesting right atrial pressure of 3 mmHg. FINDINGS  Left Ventricle: Left ventricular ejection fraction, by estimation, is 60 to 65%. The left ventricle has normal function. The left ventricle has no regional wall motion abnormalities. The left ventricular internal cavity size was normal in size. There is  mild left ventricular hypertrophy. Left ventricular diastolic parameters are consistent with Grade I diastolic dysfunction (impaired relaxation). Right Ventricle: The right ventricular size is normal.Right ventricular systolic function is normal. Tricuspid regurgitation signal is inadequate for assessing PA pressure. The tricuspid regurgitant velocity is 2.69 m/s, and with an assumed right atrial pressure of 3 mmHg, the estimated right ventricular systolic pressure is 76.1 mmHg. Left Atrium: Left atrial size was normal in size. Right Atrium: Right atrial size was normal in size. Pericardium: There is no evidence of pericardial effusion. Mitral Valve: The mitral valve is normal in structure. Mild mitral annular calcification. Mild mitral valve regurgitation. No evidence of mitral valve stenosis. Tricuspid  Valve: The tricuspid valve is normal in structure. Tricuspid valve regurgitation is trivial. No evidence of tricuspid stenosis. Aortic Valve: The aortic valve is tricuspid.  Aortic valve regurgitation is trivial. Aortic regurgitation PHT measures 658 msec. Moderate to severe aortic stenosis is present. Aortic valve mean gradient measures 30.0 mmHg. Aortic valve peak gradient measures 45.5 mmHg. Aortic valve area, by VTI measures 0.68 cm. Pulmonic Valve: The pulmonic valve was normal in structure. Pulmonic valve regurgitation is mild. No evidence of pulmonic stenosis. Aorta: The aortic root is normal in size and structure. Venous: The inferior vena cava is normal in size with greater than 50% respiratory variability, suggesting right atrial pressure of 3 mmHg. IAS/Shunts: No atrial level shunt detected by color flow Doppler. Additional Comments: There is a moderate pleural effusion in the left lateral region.  LEFT VENTRICLE PLAX 2D LVIDd:         3.80 cm LVIDs:         2.20 cm LV PW:         1.20 cm LV IVS:        1.30 cm LVOT diam:     1.90 cm LV SV:         72 LV SV Index:   40 LVOT Area:     2.84 cm  RIGHT VENTRICLE TAPSE (M-mode): 2.0 cm LEFT ATRIUM             Index       RIGHT ATRIUM           Index LA diam:        4.40 cm 2.41 cm/m  RA Area:     15.30 cm LA Vol (A2C):   45.7 ml 25.07 ml/m RA Volume:   37.50 ml  20.57 ml/m LA Vol (A4C):   48.0 ml 26.33 ml/m LA Biplane Vol: 50.8 ml 27.87 ml/m  AORTIC VALVE AV Area (Vmax):    0.66 cm AV Area (Vmean):   0.63 cm AV Area (VTI):     0.68 cm AV Vmax:           337.33 cm/s AV Vmean:          260.667 cm/s AV VTI:            1.062 m AV Peak Grad:      45.5 mmHg AV Mean Grad:      30.0 mmHg LVOT Vmax:         79.03 cm/s LVOT Vmean:        58.267 cm/s LVOT VTI:          0.256 m LVOT/AV VTI ratio: 0.24 AI PHT:            658 msec  AORTA Ao Root diam: 2.80 cm Ao Asc diam:  2.80 cm MR PISA:        0.39 cm TRICUSPID VALVE MR PISA Radius: 0.25 cm  TR Peak grad:    28.9 mmHg                          TR Vmax:        269.00 cm/s                           SHUNTS                          Systemic VTI:  0.26 m                          Systemic Diam: 1.90 cm  Kirk Ruths MD Electronically signed by Kirk Ruths MD Signature Date/Time: 02/10/2020/2:55:07 PM    Final    VAS US CAROTID  Result Date: 02/11/2020 Carotid Arterial Duplex Study Indications:       Pre-TAVR. Risk Factors:      Hypertension. Comparison Study:  No prior studies. Performing Technologist: Oliver Hum RVT  Examination Guidelines: A complete evaluation includes B-mode imaging, spectral Doppler, color Doppler, and power Doppler as needed of all accessible portions of each vessel. Bilateral testing is considered an integral part of a complete examination. Limited examinations for reoccurring indications may be performed as noted.  Right Carotid Findings: +----------+--------+--------+--------+-----------------------+--------+           PSV cm/sEDV cm/sStenosisPlaque Description     Comments +----------+--------+--------+--------+-----------------------+--------+ CCA Prox  46      3               smooth and heterogenous         +----------+--------+--------+--------+-----------------------+--------+ CCA Distal55      7               smooth and heterogenous         +----------+--------+--------+--------+-----------------------+--------+ ICA Prox  85      8               calcific                        +----------+--------+--------+--------+-----------------------+--------+ ICA Distal118     12                                     tortuous +----------+--------+--------+--------+-----------------------+--------+ ECA       148     1                                               +----------+--------+--------+--------+-----------------------+--------+ +----------+--------+-------+--------+-------------------+           PSV cm/sEDV cmsDescribeArm Pressure (mmHG)  +----------+--------+-------+--------+-------------------+ [id number]                                        +----------+--------+-------+--------+-------------------+ +---------+--------+--+--------+--+---------+ VertebralPSV cm/s42EDV cm/s12Antegrade +---------+--------+--+--------+--+---------+  Left Carotid Findings: +----------+--------+--------+--------+--------------------------+--------+           PSV cm/sEDV cm/sStenosisPlaque Description        Comments +----------+--------+--------+--------+--------------------------+--------+ CCA Prox  86      7               smooth and heterogenous            +----------+--------+--------+--------+--------------------------+--------+ CCA Distal79      10              smooth and heterogenous            +----------+--------+--------+--------+--------------------------+--------+ ICA Prox  113     20              irregular and heterogenous         +----------+--------+--------+--------+--------------------------+--------+ ICA Distal96      20  tortuous +----------+--------+--------+--------+--------------------------+--------+ ECA       90                                                         +----------+--------+--------+--------+--------------------------+--------+ +----------+--------+--------+--------+-------------------+           PSV cm/sEDV cm/sDescribeArm Pressure (mmHG) +----------+--------+--------+--------+-------------------+ Subclavian102                                         +----------+--------+--------+--------+-------------------+ +---------+--------+--+--------+--+---------+ VertebralPSV cm/s64EDV cm/s14Antegrade +---------+--------+--+--------+--+---------+   Summary: Right Carotid: Velocities in the right ICA are consistent with a 1-39% stenosis. Left Carotid: Velocities in the left ICA are consistent with a 1-39% stenosis. Vertebrals:  Bilateral vertebral arteries demonstrate antegrade flow. *See table(s) above for measurements and observations.  Electronically signed by Harold Barban MD on 02/11/2020 at 9:40:11 PM.    Final     Cardiac Studies   Echo 02/10/20 IMPRESSIONS    1. Left ventricular ejection fraction, by estimation, is 60 to 65%. The  left ventricle has normal function. The left ventricle has no regional  wall motion abnormalities. There is mild left ventricular hypertrophy.  Left ventricular diastolic parameters  are consistent with Grade I diastolic dysfunction (impaired relaxation).  2. Right ventricular systolic function is normal. The right ventricular  size is normal. Tricuspid regurgitation signal is inadequate for assessing  PA pressure.  3. Moderate pleural effusion in the left lateral region.  4. The mitral valve is normal in structure. Mild mitral valve  regurgitation. No evidence of mitral stenosis.  5. The aortic valve is tricuspid. Aortic valve regurgitation is trivial.  Moderate to severe aortic valve stenosis.  6. The inferior vena cava is normal in size with greater than 50%  respiratory variability, suggesting right atrial pressure of 3 mmHg.   FINDINGS  Left Ventricle: Left ventricular ejection fraction, by estimation, is 60  to 65%. The left ventricle has normal function. The left ventricle has no  regional wall motion abnormalities. The left ventricular internal cavity  size was normal in size. There is  mild left ventricular hypertrophy. Left ventricular diastolic parameters  are consistent with Grade I diastolic dysfunction (impaired relaxation).   Right Ventricle: The right ventricular size is normal.Right ventricular  systolic function is normal. Tricuspid regurgitation signal is inadequate  for assessing PA pressure. The tricuspid regurgitant velocity is 2.69 m/s,  and with an assumed right atrial  pressure of 3 mmHg, the estimated right ventricular systolic pressure  is  01.0 mmHg.   Left Atrium: Left atrial size was normal in size.   Right Atrium: Right atrial size was normal in size.   Pericardium: There is no evidence of pericardial effusion.   Mitral Valve: The mitral valve is normal in structure. Mild mitral annular  calcification. Mild mitral valve regurgitation. No evidence of mitral  valve stenosis.   Tricuspid Valve: The tricuspid valve is normal in structure. Tricuspid  valve regurgitation is trivial. No evidence of tricuspid stenosis.   Aortic Valve: The aortic valve is tricuspid. Aortic valve regurgitation is  trivial. Aortic regurgitation PHT measures 658 msec. Moderate to severe  aortic stenosis is present. Aortic valve mean gradient measures 30.0 mmHg.  Aortic valve peak gradient  measures 45.5 mmHg. Aortic valve  area, by VTI measures 0.68 cm.   Pulmonic Valve: The pulmonic valve was normal in structure. Pulmonic valve  regurgitation is mild. No evidence of pulmonic stenosis.   Aorta: The aortic root is normal in size and structure.   Venous: The inferior vena cava is normal in size with greater than 50%  respiratory variability, suggesting right atrial pressure of 3 mmHg.   IAS/Shunts: No atrial level shunt detected by color flow Doppler.   Additional Comments: There is a moderate pleural effusion in the left  lateral region.     LEFT VENTRICLE  PLAX 2D  LVIDd:     3.80 cm  LVIDs:     2.20 cm  LV PW:     1.20 cm  LV IVS:    1.30 cm  LVOT diam:   1.90 cm  LV SV:     72  LV SV Index:  40  LVOT Area:   2.84 cm     RIGHT VENTRICLE  TAPSE (M-mode): 2.0 cm   LEFT ATRIUM       Index    RIGHT ATRIUM      Index  LA diam:    4.40 cm 2.41 cm/m RA Area:   15.30 cm  LA Vol (A2C):  45.7 ml 25.07 ml/m RA Volume:  37.50 ml 20.57 ml/m  LA Vol (A4C):  48.0 ml 26.33 ml/m  LA Biplane Vol: 50.8 ml 27.87 ml/m  AORTIC VALVE  AV Area (Vmax):  0.66 cm  AV Area  (Vmean):  0.63 cm  AV Area (VTI):   0.68 cm  AV Vmax:      337.33 cm/s  AV Vmean:     260.667 cm/s  AV VTI:      1.062 m  AV Peak Grad:   45.5 mmHg  AV Mean Grad:   30.0 mmHg  LVOT Vmax:     79.03 cm/s  LVOT Vmean:    58.267 cm/s  LVOT VTI:     0.256 m  LVOT/AV VTI ratio: 0.24  AI PHT:      658 msec    AORTA  Ao Root diam: 2.80 cm  Ao Asc diam: 2.80 cm   MR PISA:    0.39 cm TRICUSPID VALVE  MR PISA Radius: 0.25 cm TR Peak grad:  28.9 mmHg              TR Vmax:    269.00 cm/s                SHUNTS              Systemic VTI: 0.26 m              Systemic Diam: 1.90 cm   Patient Profile     87 y.o. male with a PMH of moderate-severe aortic stenosis, HTN, HLD, BPH, chronic cystitis with recurrent UTI's, prostate cancer, and CKD stage 3b-4,now with CHF and critical AS.  Assessment & Plan    1. Acute diastolic ZOX:WRUEAVW presented with acute SOB and hypoxic.  - now on lasix 40 mg po MWF  - echocardiogram with critical AS- plan for rt and Lt cardiac cath tomorrow.  Have contacted structural heart team. Continue to monitor strict I&Os and daily weights.  Pt now to tele and I&O if accureate neg 638 and wt down from 74.9 kg to 64.6 Kg.  has outpt appt with structural heart team - Continue to monitor electrolytes closely and replete as needed to maintain K >4, Mg  at 2 - Environmental education officer - on ASA - lasix 40 mg PO MWF will hold tomorrow for cath  - now with NS at 50 an hour.   2. Aortic stenosis:moderate to severe on last echo 08/2019 with mean gradient 30.75mmHg and peak gradient 45.5 mmHg. VTI 0.68 cm Possible progression of his AS has contributed to #1-  --discussed cardiac cath Rt and lt. For tomorrow, pt could be flat,  With elevated Cr will hold today and plan for tomorrow  3. HTN:BP markedly elevated on EMS arrival, improved with SL nitro though remains high.   186/81 Management has been limited by CKD and trouble with LE edema on amlodipine. Current regimen includes labetalol 100mg  BID and hydralazine increased to 50 mg BID - Continue labetalol and hydralazine for now at new doses  4. HLD:no recent lipids - Continue statin     5.  CKD-4 with baseline Cr 2.3 to 2.4  2 years ago he was 1.66.   -- Cr 2.52 today up from 2.13 on admit   --IV fluids today monitor for CHF      For questions or updates, please contact [place] Please consult [email or web address] for contact info under        Signed, Cecilie Kicks, NP  02/12/2020, 9:26 AM    I have seen and examined the patient along with Cecilie Kicks, NP .  I have reviewed the chart, notes and new data.  I agree with PA/NP's note.  Key new complaints: no dyspnea. Tired sitting in chair, wants to get back in bed Key examination changes: Late peaking AS murmur, muffled S@, frequent ectopy Key new findings / data: creatinine up a little. PLAN:  Cardiac cath delayed until tomorrow to reduce risk of contrast nephrotoxicity.  Sanda Klein, MD, [place] [phone] 02/12/2020, 12:55 PM

## 2020-02-12 NOTE — Evaluation (Signed)
Physical Therapy Evaluation Patient Details Name: Alexander Mcdonald MRN: [id number] DOB: 1926-09-13 Today's Date: 02/12/2020   History of Present Illness  87 y.o. male of Alexander Mcdonald ([place] origin with significant macular degeneration, HTN, HLD, CKD CRT 2.2, GERD/PUD/UGIB, BPH/prostate CA managed conservatively, carotid stenosis s/p left CEA, ventricular bigeminy, moderate to severe AS AVA 0.79, MPG 29, V-max 3.36 m/s was brought in by EMS for worsening dyspnea and cough. Dx of Acute hypoxic respiratory failure  Clinical Impression  Pt admitted with above diagnosis. Pt ambulated 46' with RW, no loss of balance, SaO2 93% on room air while walking, HR 80s, 2/4 dyspnea. HHPT recommended.  Pt currently with functional limitations due to the deficits listed below (see PT Problem List). Pt will benefit from skilled PT to increase their independence and safety with mobility to allow discharge to the venue listed below.       Follow Up Recommendations Home health PT    Equipment Recommendations  None recommended by PT    Recommendations for Other Services       Precautions / Restrictions Precautions Precautions: Fall Precaution Comments: per daughter, several times in past year he's slid off of a seat with stand to sit; Blind R eye, some vision in central field L eye; HOH Restrictions Weight Bearing Restrictions: No      Mobility  Bed Mobility Overal bed mobility: Needs Assistance Bed Mobility: Supine to Sit     Supine to sit: Min assist          Transfers Overall transfer level: Needs assistance Equipment used: Rolling walker (2 wheeled) Transfers: Sit to/from Stand Sit to Stand: Min assist;From elevated surface         General transfer comment: assist to power up, VCs hand placement  Ambulation/Gait Ambulation/Gait assistance: Min guard Gait Distance (Feet): 80 Feet Assistive device: Rolling walker (2 wheeled) Gait Pattern/deviations: Step-through pattern;Decreased stride  length;Trunk flexed Gait velocity: decr   General Gait Details: steady with RW, no loss of balance, SaO2 93% on room air while walking, HR 80s, 2/4 dyspnea  Stairs            Wheelchair Mobility    Modified Rankin (Stroke Patients Only)       Balance Overall balance assessment: Needs assistance;History of Falls   Sitting balance-Leahy Scale: Good       Standing balance-Leahy Scale: Fair                               Pertinent Vitals/Pain Pain Assessment: No/denies pain    Home Living Family/patient expects to be discharged to:: Private residence Living Arrangements: Children Available Help at Discharge: Family;Available PRN/intermittently Type of Home: House Home Access: Stairs to enter   Entrance Stairs-Number of Steps: 1 Home Layout: Two level;Able to live on main level with bedroom/bathroom Home Equipment: Gilford Rile - 2 wheels;Bedside commode;Shower seat Additional Comments: lives with daughter Alexander Mcdonald who works    Prior Function Level of Independence: Needs assistance      ADL's / Land Needed: daughter helps with pants and socks, and with bathing (he stands in a shower, she helps him reach hard to reach parts)  Comments: walks with RW, assist with bathing and dressing     Hand Dominance        Extremity/Trunk Assessment   Upper Extremity Assessment Upper Extremity Assessment: Overall WFL for tasks assessed    Lower Extremity Assessment Lower Extremity Assessment: Overall WFL for tasks assessed (B  knee ext +4/5, B knee ext AROM -15* in sitting)    Cervical / Trunk Assessment Cervical / Trunk Assessment: Normal  Communication   Communication: HOH  Cognition Arousal/Alertness: Awake/alert Behavior During Therapy: WFL for tasks assessed/performed Overall Cognitive Status: Within Functional Limits for tasks assessed                                        General Comments General comments (skin  integrity, edema, etc.): SaO2 93% on room air walking    Exercises     Assessment/Plan    PT Assessment Patient needs continued PT services  PT Problem List Decreased activity tolerance;Decreased range of motion;Decreased mobility;Decreased balance       PT Treatment Interventions Gait training;Therapeutic activities;Therapeutic exercise;Functional mobility training;Patient/family education    PT Goals (Current goals can be found in the Care Plan section)  Acute Rehab PT Goals Patient Stated Goal: likes to watch football; to get stronger PT Goal Formulation: With patient/family Time For Goal Achievement: 02/26/20 Potential to Achieve Goals: Good    Frequency Min 3X/week   Barriers to discharge        Co-evaluation               AM-PAC PT "6 Clicks" Mobility  Outcome Measure Help needed turning from your back to your side while in a flat bed without using bedrails?: A Little Help needed moving from lying on your back to sitting on the side of a flat bed without using bedrails?: A Little Help needed moving to and from a bed to a chair (including a wheelchair)?: A Little Help needed standing up from a chair using your arms (e.g., wheelchair or bedside chair)?: A Little Help needed to walk in hospital room?: A Little Help needed climbing 3-5 steps with a railing? : A Little 6 Click Score: 18    End of Session Equipment Utilized During Treatment: Gait belt Activity Tolerance: Patient tolerated treatment well Patient left: in chair;with call bell/phone within reach;with chair alarm set;with family/visitor present Nurse Communication: Mobility status PT Visit Diagnosis: Difficulty in walking, not elsewhere classified (R26.2);History of falling (Z91.81)    Time: 5993-5701 PT Time Calculation (min) (ACUTE ONLY): 24 min   Charges:   PT Evaluation $PT Eval Low Complexity: 1 Low PT Treatments $Gait Training: 8-22 mins      Blondell Reveal Kistler PT 02/12/2020   Acute Rehabilitation Services Pager [phone] Office [phone]

## 2020-02-13 ENCOUNTER — Encounter (HOSPITAL_COMMUNITY)
Admission: EM | Disposition: A | Discharge status: Home Health | Payer: Self-pay | Source: Home / Self Care | Attending: Family Medicine

## 2020-02-13 DIAGNOSIS — I2510 Atherosclerotic heart disease of native coronary artery without angina pectoris: Secondary | ICD-10-CM | POA: Diagnosis not present

## 2020-02-13 DIAGNOSIS — I350 Nonrheumatic aortic (valve) stenosis: Secondary | ICD-10-CM | POA: Diagnosis not present

## 2020-02-13 DIAGNOSIS — J9621 Acute and chronic respiratory failure with hypoxia: Secondary | ICD-10-CM | POA: Diagnosis not present

## 2020-02-13 DIAGNOSIS — J810 Acute pulmonary edema: Secondary | ICD-10-CM | POA: Diagnosis not present

## 2020-02-13 DIAGNOSIS — I5033 Acute on chronic diastolic (congestive) heart failure: Secondary | ICD-10-CM | POA: Diagnosis not present

## 2020-02-13 HISTORY — PX: RIGHT/LEFT HEART CATH AND CORONARY ANGIOGRAPHY: CATH118266

## 2020-02-13 LAB — COMPREHENSIVE METABOLIC PANEL
ALT: 37 U/L (ref 0–44)
AST: 25 U/L (ref 15–41)
Albumin: 3.3 g/dL — ABNORMAL LOW (ref 3.5–5.0)
Alkaline Phosphatase: 101 U/L (ref 38–126)
Anion gap: 13 (ref 5–15)
BUN: 52 mg/dL — ABNORMAL HIGH (ref 8–23)
CO2: 23 mmol/L (ref 22–32)
Calcium: 8.9 mg/dL (ref 8.9–10.3)
Chloride: 106 mmol/L (ref 98–111)
Creatinine, Ser: 2.09 mg/dL — ABNORMAL HIGH (ref 0.61–1.24)
GFR, Estimated: 29 mL/min — ABNORMAL LOW (ref >60)
Glucose, Bld: 104 mg/dL — ABNORMAL HIGH (ref 70–99)
Potassium: 3.5 mmol/L (ref 3.5–5.1)
Sodium: 142 mmol/L (ref 135–145)
Total Bilirubin: 1.1 mg/dL (ref 0.3–1.2)
Total Protein: 5.9 g/dL — ABNORMAL LOW (ref 6.5–8.1)

## 2020-02-13 LAB — POCT I-STAT EG7
Acid-base deficit: 1 mmol/L (ref 0.0–2.0)
Bicarbonate: 23.7 mmol/L (ref 20.0–28.0)
Calcium, Ion: 1.19 mmol/L (ref 1.15–1.40)
HCT: 35 % — ABNORMAL LOW (ref 39.0–52.0)
Hemoglobin: 11.9 g/dL — ABNORMAL LOW (ref 13.0–17.0)
O2 Saturation: 66 %
Potassium: 3.7 mmol/L (ref 3.5–5.1)
Sodium: 142 mmol/L (ref 135–145)
TCO2: 25 mmol/L (ref 22–32)
pCO2, Ven: 39.5 mmHg — ABNORMAL LOW (ref 44.0–60.0)
pH, Ven: 7.386 (ref 7.250–7.430)
pO2, Ven: 34 mmHg (ref 32.0–45.0)

## 2020-02-13 LAB — CBC
HCT: 37.1 % — ABNORMAL LOW (ref 39.0–52.0)
Hemoglobin: 12.1 g/dL — ABNORMAL LOW (ref 13.0–17.0)
MCH: 30.6 pg (ref 26.0–34.0)
MCHC: 32.6 g/dL (ref 30.0–36.0)
MCV: 93.7 fL (ref 80.0–100.0)
Platelets: 230 10*3/uL (ref 150–400)
RBC: 3.96 MIL/uL — ABNORMAL LOW (ref 4.22–5.81)
RDW: 14.1 % (ref 11.5–15.5)
WBC: 9.4 10*3/uL (ref 4.0–10.5)
nRBC: 0 % (ref 0.0–0.2)

## 2020-02-13 LAB — POCT I-STAT 7, (LYTES, BLD GAS, ICA,H+H)
Acid-base deficit: 1 mmol/L (ref 0.0–2.0)
Bicarbonate: 23.6 mmol/L (ref 20.0–28.0)
Calcium, Ion: 1.22 mmol/L (ref 1.15–1.40)
HCT: 35 % — ABNORMAL LOW (ref 39.0–52.0)
Hemoglobin: 11.9 g/dL — ABNORMAL LOW (ref 13.0–17.0)
O2 Saturation: 96 %
Potassium: 3.7 mmol/L (ref 3.5–5.1)
Sodium: 143 mmol/L (ref 135–145)
TCO2: 25 mmol/L (ref 22–32)
pCO2 arterial: 37 mmHg (ref 32.0–48.0)
pH, Arterial: 7.413 (ref 7.350–7.450)
pO2, Arterial: 84 mmHg (ref 83.0–108.0)

## 2020-02-13 LAB — PROTIME-INR
INR: 1.1 (ref 0.8–1.2)
Prothrombin Time: 13.6 seconds (ref 11.4–15.2)

## 2020-02-13 SURGERY — RIGHT/LEFT HEART CATH AND CORONARY ANGIOGRAPHY
Anesthesia: LOCAL

## 2020-02-13 MED ORDER — HEPARIN SODIUM (PORCINE) 1000 UNIT/ML IJ SOLN
INTRAMUSCULAR | Status: DC | PRN
  Administered 2020-02-13: 3500 [IU] via INTRAVENOUS

## 2020-02-13 MED ORDER — SODIUM CHLORIDE 0.9% FLUSH: 3.0000 mL | INTRAVENOUS | Status: DC | PRN

## 2020-02-13 MED ORDER — FUROSEMIDE 40 MG PO TABS: 40.0000 mg | ORAL_TABLET | ORAL | 1 refills | Status: AC

## 2020-02-13 MED ORDER — HEPARIN (PORCINE) IN NACL 1000-0.9 UT/500ML-% IV SOLN
INTRAVENOUS | Status: AC
  Filled 2020-02-13: qty 500

## 2020-02-13 MED ORDER — LIDOCAINE HCL (PF) 1 % IJ SOLN
INTRAMUSCULAR | Status: DC | PRN
  Administered 2020-02-13 (×2): 2 mL

## 2020-02-13 MED ORDER — SODIUM CHLORIDE 0.9% FLUSH: 3.0000 mL | Freq: Two times a day (BID) | INTRAVENOUS | Status: DC

## 2020-02-13 MED ORDER — SODIUM CHLORIDE 0.9 % WEIGHT BASED INFUSION: 1.0000 mL/kg/h | INTRAVENOUS | Status: DC

## 2020-02-13 MED ORDER — IOHEXOL 350 MG/ML SOLN
INTRAVENOUS | Status: DC | PRN
  Administered 2020-02-13: 20 mL

## 2020-02-13 MED ORDER — SODIUM CHLORIDE 0.9 % IV SOLN: 250.0000 mL | INTRAVENOUS | Status: DC | PRN

## 2020-02-13 MED ORDER — HEPARIN (PORCINE) IN NACL 1000-0.9 UT/500ML-% IV SOLN
INTRAVENOUS | Status: AC
  Filled 2020-02-13: qty 1000

## 2020-02-13 MED ORDER — VERAPAMIL HCL 2.5 MG/ML IV SOLN
INTRAVENOUS | Status: AC
  Filled 2020-02-13: qty 2

## 2020-02-13 MED ORDER — LIDOCAINE HCL (PF) 1 % IJ SOLN
INTRAMUSCULAR | Status: AC
  Filled 2020-02-13: qty 30

## 2020-02-13 MED ORDER — HEPARIN SODIUM (PORCINE) 1000 UNIT/ML IJ SOLN
INTRAMUSCULAR | Status: AC
  Filled 2020-02-13: qty 1

## 2020-02-13 MED ORDER — MIDAZOLAM HCL 2 MG/2ML IJ SOLN
INTRAMUSCULAR | Status: AC
  Filled 2020-02-13: qty 2

## 2020-02-13 MED ORDER — MIDAZOLAM HCL 2 MG/2ML IJ SOLN
INTRAMUSCULAR | Status: DC | PRN
  Administered 2020-02-13 (×2): 0.5 mg via INTRAVENOUS

## 2020-02-13 MED ORDER — FENTANYL CITRATE (PF) 100 MCG/2ML IJ SOLN
INTRAMUSCULAR | Status: AC
  Filled 2020-02-13: qty 2

## 2020-02-13 MED ORDER — IOHEXOL 350 MG/ML SOLN
INTRAVENOUS | Status: AC
  Filled 2020-02-13: qty 1

## 2020-02-13 MED ORDER — FENTANYL CITRATE (PF) 100 MCG/2ML IJ SOLN
INTRAMUSCULAR | Status: DC | PRN
  Administered 2020-02-13 (×2): 12.5 ug via INTRAVENOUS

## 2020-02-13 MED ORDER — HEPARIN (PORCINE) IN NACL 1000-0.9 UT/500ML-% IV SOLN
INTRAVENOUS | Status: DC | PRN
  Administered 2020-02-13 (×2): 500 mL

## 2020-02-13 MED ORDER — SODIUM CHLORIDE 0.9 % IV SOLN: INTRAVENOUS | Status: DC

## 2020-02-13 MED ORDER — VERAPAMIL HCL 2.5 MG/ML IV SOLN
INTRAVENOUS | Status: DC | PRN
  Administered 2020-02-13: 10 mL via INTRA_ARTERIAL

## 2020-02-13 MED ORDER — HYDRALAZINE HCL 20 MG/ML IJ SOLN: 10.0000 mg | INTRAMUSCULAR | Status: DC | PRN

## 2020-02-13 MED ORDER — HYDRALAZINE HCL 50 MG PO TABS: 50.0000 mg | ORAL_TABLET | Freq: Two times a day (BID) | ORAL | 2 refills | Status: AC

## 2020-02-13 SURGICAL SUPPLY — 11 items
CATH 5FR JL3.5 JR4 ANG PIG MP (CATHETERS) ×2 IMPLANT
CATH BALLN WEDGE 5F 110CM (CATHETERS) ×2 IMPLANT
GLIDESHEATH SLEND SS 6F .021 (SHEATH) ×2 IMPLANT
GUIDEWIRE .025 260CM (WIRE) ×2 IMPLANT
GUIDEWIRE INQWIRE 1.5J.035X260 (WIRE) ×1 IMPLANT
INQWIRE 1.5J .035X260CM (WIRE) ×2
KIT HEART LEFT (KITS) ×2 IMPLANT
PACK CARDIAC CATHETERIZATION (CUSTOM PROCEDURE TRAY) ×2 IMPLANT
SHEATH GLIDE SLENDER 4/5FR (SHEATH) ×2 IMPLANT
TRANSDUCER W/STOPCOCK (MISCELLANEOUS) ×2 IMPLANT
TUBING CIL FLEX 10 FLL-RA (TUBING) ×2 IMPLANT

## 2020-02-13 NOTE — Discharge Instructions (Signed)
DRINK PLENTY OF FLUIDS OVER THE NEXT 2-3 DAYS.  Radial Site Care  This sheet gives you information about how to care for yourself after your procedure. Your health care provider may also give you more specific instructions. If you have problems or questions, contact your health care provider. What can I expect after the procedure? After the procedure, it is common to have:  Bruising and tenderness at the catheter insertion area. Follow these instructions at home: Medicines  Take over-the-counter and prescription medicines only as told by your health care provider. Insertion site care  Follow instructions from your health care provider about how to take care of your insertion site. Make sure you: ? Wash your hands with soap and water before you change your bandage (dressing). If soap and water are not available, use hand sanitizer. ? Change your dressing as told by your health care provider. ? Leave stitches (sutures), skin glue, or adhesive strips in place. These skin closures may need to stay in place for 2 weeks or longer. If adhesive strip edges start to loosen and curl up, you may trim the loose edges. Do not remove adhesive strips completely unless your health care provider tells you to do that.  Check your insertion site every day for signs of infection. Check for: ? Redness, swelling, or pain. ? Fluid or blood. ? Pus or a bad smell. ? Warmth.  Do not take baths, swim, or use a hot tub until your health care provider approves.  You may shower 24-48 hours after the procedure, or as directed by your health care provider. ? Remove the dressing and gently wash the site with plain soap and water. ? Pat the area dry with a clean towel. ? Do not rub the site. That could cause bleeding.  Do not apply powder or lotion to the site. Activity  For 24 hours after the procedure, or as directed by your health care provider: ? Do not flex or bend the affected arm. ? Do not push or pull  heavy objects with the affected arm. ? Do not drive yourself home from the hospital or clinic. You may drive 24 hours after the procedure unless your health care provider tells you not to. ? Do not operate machinery or power tools.  Do not lift anything that is heavier than 10 lb (4.5 kg), or the limit that you are told, until your health care provider says that it is safe.  Ask your health care provider when it is okay to: ? Return to work or school. ? Resume usual physical activities or sports. ? Resume sexual activity.   General instructions  If the catheter site starts to bleed, raise your arm and put firm pressure on the site. If the bleeding does not stop, get help right away. This is a medical emergency.  If you went home on the same day as your procedure, a responsible adult should be with you for the first 24 hours after you arrive home.  Keep all follow-up visits as told by your health care provider. This is important. Contact a health care provider if:  You have a fever.  You have redness, swelling, or yellow drainage around your insertion site. Get help right away if:  You have unusual pain at the radial site.  The catheter insertion area swells very fast.  The insertion area is bleeding, and the bleeding does not stop when you hold steady pressure on the area.  Your arm or hand becomes pale,   cool, tingly, or numb. These symptoms may represent a serious problem that is an emergency. Do not wait to see if the symptoms will go away. Get medical help right away. Call your local emergency services (911 in the [place] Do not drive yourself to the hospital. Summary  After the procedure, it is common to have bruising and tenderness at the site.  Follow instructions from your health care provider about how to take care of your radial site wound. Check the wound every day for signs of infection.  Do not lift anything that is heavier than 10 lb (4.5 kg), or the limit that you  are told, until your health care provider says that it is safe. This information is not intended to replace advice given to you by your health care provider. Make sure you discuss any questions you have with your health care provider. Document Revised: 02/07/2017 Document Reviewed: 02/07/2017 Elsevier Patient Education  2021 Elsevier Inc.  

## 2020-02-13 NOTE — Discharge Summary (Signed)
Physician Discharge Summary  Alexander Mcdonald QTM:[id number] DOB: 12/08/1926 DOA: 02/10/2020  PCP: Isaac Bliss, Rayford Halsted, MD  Admit date: 02/10/2020 Discharge date: 02/13/2020  Time spent: 37Minutes  Recommendations for Outpatient Follow-up:  1. Being set up for TAVR in the outpatient setting follow-up with cardiology 2. requires Chem-12 CBC 1 week in the outpatient setting as we started diuretics this admission 3. Would recommend outpatient follow-up with oncology  Discharge Diagnoses:  Active Problems:   HYPERCHOLESTEROLEMIA   Essential hypertension   PROSTATE CANCER, HX OF   (HFpEF) heart failure with preserved ejection fraction (HCC)   Acute on chronic respiratory failure with hypoxia (HCC)   Hypoxia   CHF (congestive heart failure) ([place])   Discharge Condition: Improved  Diet recommendation: Heart healthy  Filed Weights   02/12/20 0529 02/12/20 1623 02/13/20 0500  Weight: 64.6 kg 65.5 kg 66.1 kg    History of present illness:  87 year old home dwelling white male-ambulates with walker HTN HLD hypothyroid BPH/prostate cancer conservatively managed Carotid stenosis status post left CEA macular degeneration CKD 3 baseline creatinine 2.2 Moderate to severe aortic stenosis Prior GI bleed 06/2017 EGD showed severe esophagitis duodenal ulcer at the time  Patient admitted hypoxic 84% on room air stating dyspnea blood pressure found to be elevated Work-up showed BNP 1699 troponin XVII BUN/creatinine 48/2.1 CXR pulmonary infiltrate  Found to have heart failure and cardiology consulted  Hospital Course:  1. Acute superimposed on HFpEF-this admission EF 60-65% a. New onset likely exacerbated by AOS b. Diuretics held temporarily but restarted on discharge 3 times a week 40 mg c. Continue labetalol 100 twice daily, hydralazine was increased to 50 twice daily 2. Aortic stenosis/PVC/functional bradycardia a. Aortic stenosis seems to vary based on echo although seems to have  a poor functional baseline b. Cardiac cath being done at [facility] 1/28 and then can discharge c. Outpatient planning for eventual TAVR 3. HTN  4. AKI superimposed on CKD 3 a. Secondary to diuretics but improved b. Not a candidate for ACE 5. Prostate cancer/BPH a. Outpatient monitoring and management as per urologist/oncologist 6. Carotid stenosis in the past a. Continue aspirin 81, atorvastatin 10 7. Hypothyroid a. Continue Synthroid 50 8. Prior GI bleed a. Monitor trends   Procedures: Echo Cardiac cath planned Consultations:  Cardiology  Discharge Exam: Vitals:   02/12/20 2039 02/13/20 0442  BP: (!) 152/64 (!) 162/56  Pulse: 61 76  Resp: 18 18  Temp: 98.2 F (36.8 C) 98 F (36.7 C)  SpO2: 93% 94%    General: EOMI NCAT no focal deficit although is blind in right eye Cardiovascular: S1-S2 no murmur no rub no gallop on monitors PVCs rate 60 Respiratory: Clinically clear no added sound Abdomen soft no rebound no guarding  Discharge Instructions   Discharge Instructions    Diet - low sodium heart healthy   Complete by: As directed    Discharge instructions   Complete by: As directed    Please look at your medications carefully as several have changed  you will need to be on Lasix 3 times a week to help keep fluid off of the You will go to get a cardiac catheterization at [facility] later toda and they will talk to you about the next steps and follow-up He will need labs in about 1 week and we will have home health come out to help you with therapy at home to rehabilitate   Increase activity slowly   Complete by: As directed  Allergies as of 02/13/2020      Reactions   Shellfish Allergy Anaphylaxis   Pt is allergic to mussels    Brimonidine Itching   Itching and swelling red   Brinzolamide-brimonidine Itching   Dorzolamide Hcl-timolol Mal Other (See Comments)   Eye itch and redness   Timolol Itching   Itching rash under eyes.       Medication List    STOP taking these medications   Centrum Silver tablet     TAKE these medications   acetaminophen 325 MG tablet Commonly known as: TYLENOL Take 2 tablets (650 mg total) by mouth every 6 (six) hours as needed for mild pain (or Fever >/= 101). What changed: reasons to take this   apraclonidine 0.5 % ophthalmic solution Commonly known as: IOPIDINE Place 1 drop into the left eye in the morning and at bedtime.   aspirin EC 81 MG tablet Take 1 tablet (81 mg total) by mouth daily.   atorvastatin 10 MG tablet Commonly known as: LIPITOR TAKE 1 TABLET(10 MG) BY MOUTH DAILY What changed: See the new instructions.   brinzolamide 1 % ophthalmic suspension Commonly known as: AZOPT 1 drop into affected eye   cholecalciferol 25 MCG (1000 UNIT) tablet Commonly known as: VITAMIN D3 Take 1,000 Units by mouth daily.   fish oil-omega-3 fatty acids 1000 MG capsule Take 1 g by mouth daily.   furosemide 40 MG tablet Commonly known as: LASIX Take 1 tablet (40 mg total) by mouth every Monday, Wednesday, and Friday.   hydrALAZINE 50 MG tablet Commonly known as: APRESOLINE Take 1 tablet (50 mg total) by mouth 2 (two) times daily. What changed:   medication strength  how much to take  when to take this   labetalol 100 MG tablet Commonly known as: NORMODYNE TAKE 1 TABLET(100 MG) BY MOUTH TWICE DAILY What changed: See the new instructions.   latanoprost 0.005 % ophthalmic solution Commonly known as: XALATAN Place 1 drop into the left eye at bedtime.   levocetirizine 5 MG tablet Commonly known as: Xyzal Take 1 tablet (5 mg total) by mouth every evening.   levothyroxine 50 MCG tablet Commonly known as: SYNTHROID TAKE 1 TABLET BY MOUTH DAILY What changed:   when to take this  additional instructions   Magnesium 250 MG Tabs Take 250 mg by mouth daily.   Melatonin 10 MG Caps Take 10 mg by mouth at bedtime.   multivitamin with minerals Tabs tablet Take 1  tablet by mouth daily.   Rhodiola 300 MG Caps Take 300 mg by mouth daily.   vitamin C 1000 MG tablet Take 1,000 mg by mouth daily.   zinc gluconate 50 MG tablet Take 50 mg by mouth daily.      Allergies  Allergen Reactions  . Shellfish Allergy Anaphylaxis    Pt is allergic to mussels   . Brimonidine Itching    Itching and swelling red  . Brinzolamide-Brimonidine Itching  . Dorzolamide Hcl-Timolol Mal Other (See Comments)    Eye itch and redness  . Timolol Itching    Itching rash under eyes.    Follow-up Information    Burnell Blanks, MD Follow up on 02/18/2020.   Specialty: Cardiology Why: at 9:30 AM about your heart valve Contact information: [place] [place] [address] [phone]                The results of significant diagnostics from this hospitalization (including imaging, microbiology, ancillary and laboratory)  are listed below for reference.    Significant Diagnostic Studies: DG Chest 2 View  Result Date: 02/10/2020 CLINICAL DATA:  Shortness of breath. EXAM: CHEST - 2 VIEW COMPARISON:  10/30/2019. FINDINGS: Mediastinum and hilar structures normal. Heart size normal. Diffuse bilateral pulmonary infiltrates/edema. Small bilateral pleural effusions. No pneumothorax. Ankylosis thoracic spine. IMPRESSION: Diffuse bilateral pulmonary infiltrates/edema. Small bilateral pleural effusions. Electronically Signed   By: Marcello Moores  Register   On: 02/10/2020 05:33   ECHOCARDIOGRAM COMPLETE  Result Date: 02/10/2020    ECHOCARDIOGRAM REPORT   Patient Name:   Alexander Mcdonald Date of Exam: 02/10/2020 Medical Rec #:  [id number]       Height:       65.0 in Accession #:    [id number]      Weight:       165.0 lb Date of Birth:  [birth year]        BSA:          1.823 m Patient Age:    9 years        BP:           155/67 mmHg Patient Gender: M               HR:           67 bpm. Exam Location:  Inpatient Procedure: 2D Echo, Color Doppler and Cardiac  Doppler Indications:    Aortic stenosis I35.0  History:        Patient has prior history of Echocardiogram examinations, most                 recent 09/01/2019. Risk Factors:Hypertension.  Sonographer:    Bernadene Person RDCS Referring Phys: [id number] [place]  1. Left ventricular ejection fraction, by estimation, is 60 to 65%. The left ventricle has normal function. The left ventricle has no regional wall motion abnormalities. There is mild left ventricular hypertrophy. Left ventricular diastolic parameters are consistent with Grade I diastolic dysfunction (impaired relaxation).  2. Right ventricular systolic function is normal. The right ventricular size is normal. Tricuspid regurgitation signal is inadequate for assessing PA pressure.  3. Moderate pleural effusion in the left lateral region.  4. The mitral valve is normal in structure. Mild mitral valve regurgitation. No evidence of mitral stenosis.  5. The aortic valve is tricuspid. Aortic valve regurgitation is trivial. Moderate to severe aortic valve stenosis.  6. The inferior vena cava is normal in size with greater than 50% respiratory variability, suggesting right atrial pressure of 3 mmHg. FINDINGS  Left Ventricle: Left ventricular ejection fraction, by estimation, is 60 to 65%. The left ventricle has normal function. The left ventricle has no regional wall motion abnormalities. The left ventricular internal cavity size was normal in size. There is  mild left ventricular hypertrophy. Left ventricular diastolic parameters are consistent with Grade I diastolic dysfunction (impaired relaxation). Right Ventricle: The right ventricular size is normal.Right ventricular systolic function is normal. Tricuspid regurgitation signal is inadequate for assessing PA pressure. The tricuspid regurgitant velocity is 2.69 m/s, and with an assumed right atrial pressure of 3 mmHg, the estimated right ventricular systolic pressure is 98.3 mmHg. Left  Atrium: Left atrial size was normal in size. Right Atrium: Right atrial size was normal in size. Pericardium: There is no evidence of pericardial effusion. Mitral Valve: The mitral valve is normal in structure. Mild mitral annular calcification. Mild mitral valve regurgitation. No evidence of mitral valve stenosis. Tricuspid Valve: The tricuspid valve is normal in structure. Tricuspid  valve regurgitation is trivial. No evidence of tricuspid stenosis. Aortic Valve: The aortic valve is tricuspid. Aortic valve regurgitation is trivial. Aortic regurgitation PHT measures 658 msec. Moderate to severe aortic stenosis is present. Aortic valve mean gradient measures 30.0 mmHg. Aortic valve peak gradient measures 45.5 mmHg. Aortic valve area, by VTI measures 0.68 cm. Pulmonic Valve: The pulmonic valve was normal in structure. Pulmonic valve regurgitation is mild. No evidence of pulmonic stenosis. Aorta: The aortic root is normal in size and structure. Venous: The inferior vena cava is normal in size with greater than 50% respiratory variability, suggesting right atrial pressure of 3 mmHg. IAS/Shunts: No atrial level shunt detected by color flow Doppler. Additional Comments: There is a moderate pleural effusion in the left lateral region.  LEFT VENTRICLE PLAX 2D LVIDd:         3.80 cm LVIDs:         2.20 cm LV PW:         1.20 cm LV IVS:        1.30 cm LVOT diam:     1.90 cm LV SV:         72 LV SV Index:   40 LVOT Area:     2.84 cm  RIGHT VENTRICLE TAPSE (M-mode): 2.0 cm LEFT ATRIUM             Index       RIGHT ATRIUM           Index LA diam:        4.40 cm 2.41 cm/m  RA Area:     15.30 cm LA Vol (A2C):   45.7 ml 25.07 ml/m RA Volume:   37.50 ml  20.57 ml/m LA Vol (A4C):   48.0 ml 26.33 ml/m LA Biplane Vol: 50.8 ml 27.87 ml/m  AORTIC VALVE AV Area (Vmax):    0.66 cm AV Area (Vmean):   0.63 cm AV Area (VTI):     0.68 cm AV Vmax:           337.33 cm/s AV Vmean:          260.667 cm/s AV VTI:            1.062 m AV  Peak Grad:      45.5 mmHg AV Mean Grad:      30.0 mmHg LVOT Vmax:         79.03 cm/s LVOT Vmean:        58.267 cm/s LVOT VTI:          0.256 m LVOT/AV VTI ratio: 0.24 AI PHT:            658 msec  AORTA Ao Root diam: 2.80 cm Ao Asc diam:  2.80 cm MR PISA:        0.39 cm TRICUSPID VALVE MR PISA Radius: 0.25 cm  TR Peak grad:   28.9 mmHg                          TR Vmax:        269.00 cm/s                           SHUNTS                          Systemic VTI:  0.26 m  Systemic Diam: 1.90 cm Kirk Ruths MD Electronically signed by Kirk Ruths MD Signature Date/Time: 02/10/2020/2:55:07 PM    Final    VAS US CAROTID  Result Date: 02/11/2020 Carotid Arterial Duplex Study Indications:       Pre-TAVR. Risk Factors:      Hypertension. Comparison Study:  No prior studies. Performing Technologist: Oliver Hum RVT  Examination Guidelines: A complete evaluation includes B-mode imaging, spectral Doppler, color Doppler, and power Doppler as needed of all accessible portions of each vessel. Bilateral testing is considered an integral part of a complete examination. Limited examinations for reoccurring indications may be performed as noted.  Right Carotid Findings: +----------+--------+--------+--------+-----------------------+--------+           PSV cm/sEDV cm/sStenosisPlaque Description     Comments +----------+--------+--------+--------+-----------------------+--------+ CCA Prox  46      3               smooth and heterogenous         +----------+--------+--------+--------+-----------------------+--------+ CCA Distal55      7               smooth and heterogenous         +----------+--------+--------+--------+-----------------------+--------+ ICA Prox  85      8               calcific                        +----------+--------+--------+--------+-----------------------+--------+ ICA Distal118     12                                     tortuous  +----------+--------+--------+--------+-----------------------+--------+ ECA       148     1                                               +----------+--------+--------+--------+-----------------------+--------+ +----------+--------+-------+--------+-------------------+           PSV cm/sEDV cmsDescribeArm Pressure (mmHG) +----------+--------+-------+--------+-------------------+ [id number]                                        +----------+--------+-------+--------+-------------------+ +---------+--------+--+--------+--+---------+ VertebralPSV cm/s42EDV cm/s12Antegrade +---------+--------+--+--------+--+---------+  Left Carotid Findings: +----------+--------+--------+--------+--------------------------+--------+           PSV cm/sEDV cm/sStenosisPlaque Description        Comments +----------+--------+--------+--------+--------------------------+--------+ CCA Prox  86      7               smooth and heterogenous            +----------+--------+--------+--------+--------------------------+--------+ CCA Distal79      10              smooth and heterogenous            +----------+--------+--------+--------+--------------------------+--------+ ICA Prox  113     20              irregular and heterogenous         +----------+--------+--------+--------+--------------------------+--------+ ICA Distal96      20  tortuous +----------+--------+--------+--------+--------------------------+--------+ ECA       90                                                         +----------+--------+--------+--------+--------------------------+--------+ +----------+--------+--------+--------+-------------------+           PSV cm/sEDV cm/sDescribeArm Pressure (mmHG) +----------+--------+--------+--------+-------------------+ Subclavian102                                          +----------+--------+--------+--------+-------------------+ +---------+--------+--+--------+--+---------+ VertebralPSV cm/s64EDV cm/s14Antegrade +---------+--------+--+--------+--+---------+   Summary: Right Carotid: Velocities in the right ICA are consistent with a 1-39% stenosis. Left Carotid: Velocities in the left ICA are consistent with a 1-39% stenosis. Vertebrals: Bilateral vertebral arteries demonstrate antegrade flow. *See table(s) above for measurements and observations.  Electronically signed by Harold Barban MD on 02/11/2020 at 9:40:11 PM.    Final     Microbiology: Recent Results (from the past 240 hour(s))  SARS Coronavirus 2 by RT PCR (hospital order, performed in [facility] hospital lab) Nasopharyngeal Nasopharyngeal Swab     Status: None   Collection Time: 02/10/20  3:47 AM   Specimen: Nasopharyngeal Swab  Result Value Ref Range Status   SARS Coronavirus 2 NEGATIVE NEGATIVE Final    Comment: (NOTE) SARS-CoV-2 target nucleic acids are NOT DETECTED.  The SARS-CoV-2 RNA is generally detectable in upper and lower respiratory specimens during the acute phase of infection. The lowest concentration of SARS-CoV-2 viral copies this assay can detect is 250 copies / mL. A negative result does not preclude SARS-CoV-2 infection and should not be used as the sole basis for treatment or other patient management decisions.  A negative result may occur with improper specimen collection / handling, submission of specimen other than nasopharyngeal swab, presence of viral mutation(s) within the areas targeted by this assay, and inadequate number of viral copies (<250 copies / mL). A negative result must be combined with clinical observations, patient history, and epidemiological information.  Fact Sheet for Patients:   StrictlyIdeas.no  Fact Sheet for Healthcare Providers: BankingDealers.co.za  This test is not yet approved or  cleared  by the [place] FDA and has been authorized for detection and/or diagnosis of SARS-CoV-2 by FDA under an Emergency Use Authorization (EUA).  This EUA will remain in effect (meaning this test can be used) for the duration of the COVID-19 declaration under Section 564(b)(1) of the Act, 21 U.S.C. section 360bbb-3(b)(1), unless the authorization is terminated or revoked sooner.  Performed at [facility], [place] [address], [place], [place] [address]      Labs: Basic Metabolic Panel: Recent Labs  Lab 02/10/20 0346 02/10/20 0400 02/11/20 0416 02/12/20 0530 02/13/20 0551  NA 140  --  141 139 142  K 4.5  --  3.7 3.6 3.5  CL 107  --  101 101 106  CO2 19*  --  25 24 23   GLUCOSE 124*  --  126* 114* 104*  BUN 48*  --  48* 54* 52*  CREATININE 2.13*  --  2.22* 2.52* 2.09*  CALCIUM 9.4  --  9.4 9.4 8.9  MG  --  2.3  --   --   --    Liver Function Tests: Recent Labs  Lab 02/11/20  2423 02/12/20 0530 02/13/20 0551  AST 29 28 25   ALT 60* 45* 37  ALKPHOS 139* 112 101  BILITOT 1.2 1.2 1.1  PROT 6.6 6.3* 5.9*  ALBUMIN 3.6 3.4* 3.3*   No results for input(s): LIPASE, AMYLASE in the last 168 hours. No results for input(s): AMMONIA in the last 168 hours. CBC: Recent Labs  Lab 02/10/20 0346 02/11/20 0416 02/12/20 0530 02/13/20 0551  WBC 10.0 10.1 9.5 9.4  NEUTROABS 7.8*  --   --   --   HGB 12.0* 12.6* 12.7* 12.1*  HCT 37.1* 38.5* 39.0 37.1*  MCV 95.9 93.2 92.9 93.7  PLT 239 248 256 230   Cardiac Enzymes: No results for input(s): CKTOTAL, CKMB, CKMBINDEX, TROPONINI in the last 168 hours. BNP: BNP (last 3 results) Recent Labs    02/10/20 0346  BNP 1,699.8*    ProBNP (last 3 results) No results for input(s): PROBNP in the last 8760 hours.  CBG: No results for input(s): GLUCAP in the last 168 hours.     Signed:  Nita Sells MD   [facility] 02/13/2020, 9:40 AM

## 2020-02-13 NOTE — TOC Transition Note (Signed)
Transition of Care [facility]) - CM/SW Discharge Note   Patient Details  Name: Alexander Mcdonald MRN: [id number] Date of Birth: April 05, 1926  Transition of Care [facility]) CM/SW Contact:  Ross Ludwig, LCSW Phone Number: 02/13/2020, 10:09 AM   Clinical Narrative:    Patient will be going home with home health through [facility].  CSW signing off please reconsult with any other social work needs, home health agency has been notified of planned discharge.  Patient's daughter chose [facility], CSW spoke to Amy at [facility], they can accept patient.    Final next level of care: [place] Barriers to Discharge: Barriers Resolved   Patient Goals and CMS Choice Patient states their goals for this hospitalization and ongoing recovery are:: To return back home with home health services. CMS Medicare.gov Compare Post Acute Care list provided to:: Patient Represenative (must comment) Choice offered to / list presented to : Adult Children  Discharge Placement                       Discharge Plan and Services                          HH Arranged: PT HH Agency: [facility] Date HH Agency Contacted: 02/13/20 Time [place]: 1000 Representative spoke with at [place]: Amy  Social Determinants of Health ([place]) Interventions     Readmission Risk Interventions No flowsheet data found.

## 2020-02-13 NOTE — Progress Notes (Addendum)
Progress Note  Patient Name: Alexander Mcdonald Date of Encounter: 02/13/2020  [facility] Cardiologist: Sanda Klein, MD  Subjective   Feeling well. No chest pain, sob or palpitations. For R & L cath today.   Inpatient Medications    Scheduled Meds: . apraclonidine  1 drop Left Eye TID  . aspirin EC  81 mg Oral Daily  . atorvastatin  10 mg Oral Daily  . furosemide  40 mg Oral Q M,W,F  . heparin  5,000 Units Subcutaneous Q8H  . hydrALAZINE  50 mg Oral BID  . labetalol  100 mg Oral BID  . latanoprost  1 drop Left Eye QHS  . levothyroxine  50 mcg Oral Daily  . loratadine  10 mg Oral QPM  . multivitamin with minerals  1 tablet Oral Daily  . sodium chloride flush  3 mL Intravenous Q12H  . sodium chloride flush  3 mL Intravenous Q12H   Continuous Infusions: . sodium chloride    . sodium chloride 50 mL/hr at 02/13/20 0009  . sodium chloride    . sodium chloride     PRN Meds: sodium chloride, sodium chloride, acetaminophen **OR** acetaminophen, ondansetron **OR** ondansetron (ZOFRAN) IV, polyethylene glycol, sodium chloride flush, sodium chloride flush   Vital Signs    Vitals:   02/12/20 1623 02/12/20 2039 02/13/20 0442 02/13/20 0500  BP:  (!) 152/64 (!) 162/56   Pulse:  61 76   Resp:  18 18   Temp:  98.2 F (36.8 C) 98 F (36.7 C)   TempSrc:  Oral Oral   SpO2:  93% 94%   Weight: 65.5 kg   66.1 kg  Height:        Intake/Output Summary (Last 24 hours) at 02/13/2020 1034 Last data filed at 02/13/2020 0500 Gross per 24 hour  Intake 860.63 ml  Output 600 ml  Net 260.63 ml   Last 3 Weights 02/13/2020 02/12/2020 02/12/2020  Weight (lbs) 145 lb 11.6 oz 144 lb 6.4 oz 142 lb 6.7 oz  Weight (kg) 66.1 kg 65.5 kg 64.6 kg      Telemetry    SR, PVCs, Venti Bigemny - Personally Reviewed  ECG    N/A  Physical Exam   GEN: No acute distress. Pt is blind.   Neck: No JVD Cardiac: Irregular, 3/6 systolic murmurs, rubs, or gallops.  Respiratory: Clear to auscultation  bilaterally. GI: Soft, nontender, non-distended  MS: No edema; No deformity. Neuro:  Nonfocal  Psych: Normal affect   Labs    High Sensitivity Troponin:   Recent Labs  Lab 02/10/20 0346 02/10/20 1207  TROPONINIHS 17 21*      Chemistry Recent Labs  Lab 02/11/20 0416 02/12/20 0530 02/13/20 0551  NA 141 139 142  K 3.7 3.6 3.5  CL 101 101 106  CO2 25 24 23   GLUCOSE 126* 114* 104*  BUN 48* 54* 52*  CREATININE 2.22* 2.52* 2.09*  CALCIUM 9.4 9.4 8.9  PROT 6.6 6.3* 5.9*  ALBUMIN 3.6 3.4* 3.3*  AST 29 28 25   ALT 60* 45* 37  ALKPHOS 139* 112 101  BILITOT 1.2 1.2 1.1  GFRNONAA 27* 23* 29*  ANIONGAP 15 14 13      Hematology Recent Labs  Lab 02/11/20 0416 02/12/20 0530 02/13/20 0551  WBC 10.1 9.5 9.4  RBC 4.13* 4.20* 3.96*  HGB 12.6* 12.7* 12.1*  HCT 38.5* 39.0 37.1*  MCV 93.2 92.9 93.7  MCH 30.5 30.2 30.6  MCHC 32.7 32.6 32.6  RDW 14.2 14.1 14.1  PLT 248 256 230    BNP Recent Labs  Lab 02/10/20 0346  BNP 1,699.8*     Radiology    VAS US CAROTID  Result Date: 02/11/2020 Carotid Arterial Duplex Study Indications:       Pre-TAVR. Risk Factors:      Hypertension. Comparison Study:  No prior studies. Performing Technologist: Oliver Hum RVT  Examination Guidelines: A complete evaluation includes B-mode imaging, spectral Doppler, color Doppler, and power Doppler as needed of all accessible portions of each vessel. Bilateral testing is considered an integral part of a complete examination. Limited examinations for reoccurring indications may be performed as noted.  Right Carotid Findings: +----------+--------+--------+--------+-----------------------+--------+           PSV cm/sEDV cm/sStenosisPlaque Description     Comments +----------+--------+--------+--------+-----------------------+--------+ CCA Prox  46      3               smooth and heterogenous         +----------+--------+--------+--------+-----------------------+--------+ CCA Distal55       7               smooth and heterogenous         +----------+--------+--------+--------+-----------------------+--------+ ICA Prox  85      8               calcific                        +----------+--------+--------+--------+-----------------------+--------+ ICA Distal118     12                                     tortuous +----------+--------+--------+--------+-----------------------+--------+ ECA       148     1                                               +----------+--------+--------+--------+-----------------------+--------+ +----------+--------+-------+--------+-------------------+           PSV cm/sEDV cmsDescribeArm Pressure (mmHG) +----------+--------+-------+--------+-------------------+ [id number]                                        +----------+--------+-------+--------+-------------------+ +---------+--------+--+--------+--+---------+ VertebralPSV cm/s42EDV cm/s12Antegrade +---------+--------+--+--------+--+---------+  Left Carotid Findings: +----------+--------+--------+--------+--------------------------+--------+           PSV cm/sEDV cm/sStenosisPlaque Description        Comments +----------+--------+--------+--------+--------------------------+--------+ CCA Prox  86      7               smooth and heterogenous            +----------+--------+--------+--------+--------------------------+--------+ CCA Distal79      10              smooth and heterogenous            +----------+--------+--------+--------+--------------------------+--------+ ICA Prox  113     20              irregular and heterogenous         +----------+--------+--------+--------+--------------------------+--------+ ICA Distal96      20  tortuous +----------+--------+--------+--------+--------------------------+--------+ ECA       90                                                          +----------+--------+--------+--------+--------------------------+--------+ +----------+--------+--------+--------+-------------------+           PSV cm/sEDV cm/sDescribeArm Pressure (mmHG) +----------+--------+--------+--------+-------------------+ Subclavian102                                         +----------+--------+--------+--------+-------------------+ +---------+--------+--+--------+--+---------+ VertebralPSV cm/s64EDV cm/s14Antegrade +---------+--------+--+--------+--+---------+   Summary: Right Carotid: Velocities in the right ICA are consistent with a 1-39% stenosis. Left Carotid: Velocities in the left ICA are consistent with a 1-39% stenosis. Vertebrals: Bilateral vertebral arteries demonstrate antegrade flow. *See table(s) above for measurements and observations.  Electronically signed by Harold Barban MD on 02/11/2020 at 9:40:11 PM.    Final     Cardiac Studies   Echo 02/10/20 IMPRESSIONS    1. Left ventricular ejection fraction, by estimation, is 60 to 65%. The  left ventricle has normal function. The left ventricle has no regional  wall motion abnormalities. There is mild left ventricular hypertrophy.  Left ventricular diastolic parameters  are consistent with Grade I diastolic dysfunction (impaired relaxation).  2. Right ventricular systolic function is normal. The right ventricular  size is normal. Tricuspid regurgitation signal is inadequate for assessing  PA pressure.  3. Moderate pleural effusion in the left lateral region.  4. The mitral valve is normal in structure. Mild mitral valve  regurgitation. No evidence of mitral stenosis.  5. The aortic valve is tricuspid. Aortic valve regurgitation is trivial.  Moderate to severe aortic valve stenosis.  6. The inferior vena cava is normal in size with greater than 50%  respiratory variability, suggesting right atrial pressure of 3 mmHg.   FINDINGS  Left Ventricle: Left ventricular ejection  fraction, by estimation, is 60  to 65%. The left ventricle has normal function. The left ventricle has no  regional wall motion abnormalities. The left ventricular internal cavity  size was normal in size. There is  mild left ventricular hypertrophy. Left ventricular diastolic parameters  are consistent with Grade I diastolic dysfunction (impaired relaxation).   Right Ventricle: The right ventricular size is normal.Right ventricular  systolic function is normal. Tricuspid regurgitation signal is inadequate  for assessing PA pressure. The tricuspid regurgitant velocity is 2.69 m/s,  and with an assumed right atrial  pressure of 3 mmHg, the estimated right ventricular systolic pressure is  96.2 mmHg.   Left Atrium: Left atrial size was normal in size.   Right Atrium: Right atrial size was normal in size.   Pericardium: There is no evidence of pericardial effusion.   Mitral Valve: The mitral valve is normal in structure. Mild mitral annular  calcification. Mild mitral valve regurgitation. No evidence of mitral  valve stenosis.   Tricuspid Valve: The tricuspid valve is normal in structure. Tricuspid  valve regurgitation is trivial. No evidence of tricuspid stenosis.   Aortic Valve: The aortic valve is tricuspid. Aortic valve regurgitation is  trivial. Aortic regurgitation PHT measures 658 msec. Moderate to severe  aortic stenosis is present. Aortic valve mean gradient measures 30.0 mmHg.  Aortic valve peak gradient  measures 45.5 mmHg. Aortic valve  area, by VTI measures 0.68 cm.   Pulmonic Valve: The pulmonic valve was normal in structure. Pulmonic valve  regurgitation is mild. No evidence of pulmonic stenosis.   Aorta: The aortic root is normal in size and structure.   Venous: The inferior vena cava is normal in size with greater than 50%  respiratory variability, suggesting right atrial pressure of 3 mmHg.   IAS/Shunts: No atrial level shunt detected by color flow Doppler.    Additional Comments: There is a moderate pleural effusion in the left  lateral region.     LEFT VENTRICLE  PLAX 2D  LVIDd:     3.80 cm  LVIDs:     2.20 cm  LV PW:     1.20 cm  LV IVS:    1.30 cm  LVOT diam:   1.90 cm  LV SV:     72  LV SV Index:  40  LVOT Area:   2.84 cm     RIGHT VENTRICLE  TAPSE (M-mode): 2.0 cm   LEFT ATRIUM       Index    RIGHT ATRIUM      Index  LA diam:    4.40 cm 2.41 cm/m RA Area:   15.30 cm  LA Vol (A2C):  45.7 ml 25.07 ml/m RA Volume:  37.50 ml 20.57 ml/m  LA Vol (A4C):  48.0 ml 26.33 ml/m  LA Biplane Vol: 50.8 ml 27.87 ml/m  AORTIC VALVE  AV Area (Vmax):  0.66 cm  AV Area (Vmean):  0.63 cm  AV Area (VTI):   0.68 cm  AV Vmax:      337.33 cm/s  AV Vmean:     260.667 cm/s  AV VTI:      1.062 m  AV Peak Grad:   45.5 mmHg  AV Mean Grad:   30.0 mmHg  LVOT Vmax:     79.03 cm/s  LVOT Vmean:    58.267 cm/s  LVOT VTI:     0.256 m  LVOT/AV VTI ratio: 0.24  AI PHT:      658 msec    AORTA  Ao Root diam: 2.80 cm  Ao Asc diam: 2.80 cm   MR PISA:    0.39 cm TRICUSPID VALVE  MR PISA Radius: 0.25 cm TR Peak grad:  28.9 mmHg              TR Vmax:    269.00 cm/s                SHUNTS              Systemic VTI: 0.26 m              Systemic Diam: 1.90 cm  Patient Profile     87 y.o. male with a PMH of moderate-severe aortic stenosis, HTN, HLD, BPH, chronic cystitis with recurrent UTI's, prostate cancer, and CKD stage 3b-4,now with CHF and critical AS.  Assessment & Plan    1. Aortic stenosis -Echo this admission showed Moderate to severe aortic stenosis with Aortic valve mean gradient measures 30.0 mmHg. Aortic valve peak gradient  measures 45.5 mmHg. Aortic valve area, by VTI measures 0.68 cm.  - Plan for R & L cath today>> watch renal function closely . Has follow up with  Dr. Angelena Form 02/18/20  2. Acute diastolic CHF - Likely due to #1. Presented with acute SOB and noted hypoxic.  - Treated with IV lasix 40mg  x 2 >> Po lasix 40mg  MWF>> held for  cath today   3. CKD IIIb-IV - Scr 2.09 today, improved from 2.52 yesterday  - Seem baseline 2.3-2.4  4. HTN - Elevated on arrival  - Remain elevated but improved - Management has been limited by CKD and trouble with LE edema on amlodipine - On hydralazine 50mg  BID and labetalol 100mg  BID>> adjust as outpatient  For questions or updates, please contact [place] Please consult [email or web address] for contact info under    Signed, Leanor Kail, PA  02/13/2020, 10:34 AM    I have seen and examined the patient along with Leanor Kail, PA .  I have reviewed the chart, notes and new data.  I agree with PA/NP's note.  Key new complaints: breathing comfortably Key examination changes: RRR w freq ectopy (PVCs), barely audible S2, late peaking aortic ejection murmur. Key new findings / data: creatinine improved back to baseline.  PLAN: For R and L heart cath today. This procedure has been fully reviewed with the patient and written informed consent has been obtained.. Unless filling pressures are still markedly elevated, we may elect to DC home directly form the [facility] campus after appropriate bedrest/hemostasis. Will arrange a follow up BMET next Monday and appropriate Structural Heart clinic follow up.  Sanda Klein, MD, [place] [phone] 02/13/2020, 11:52 AM

## 2020-02-13 NOTE — Care Management Important Message (Signed)
Important Message  Patient Details IM Letter placed in Patient's room Name: Alexander Mcdonald MRN: [id number] Date of Birth: [birth year]   Medicare Important Message Given:  Yes     Kerin Salen 02/13/2020, 1:58 PM

## 2020-02-13 NOTE — H&P (View-Only) (Signed)
Progress Note  Patient Name: Alexander Mcdonald Date of Encounter: 02/13/2020  [facility] Cardiologist: Sanda Klein, MD  Subjective   Feeling well. No chest pain, sob or palpitations. For R & L cath today.   Inpatient Medications    Scheduled Meds: . apraclonidine  1 drop Left Eye TID  . aspirin EC  81 mg Oral Daily  . atorvastatin  10 mg Oral Daily  . furosemide  40 mg Oral Q M,W,F  . heparin  5,000 Units Subcutaneous Q8H  . hydrALAZINE  50 mg Oral BID  . labetalol  100 mg Oral BID  . latanoprost  1 drop Left Eye QHS  . levothyroxine  50 mcg Oral Daily  . loratadine  10 mg Oral QPM  . multivitamin with minerals  1 tablet Oral Daily  . sodium chloride flush  3 mL Intravenous Q12H  . sodium chloride flush  3 mL Intravenous Q12H   Continuous Infusions: . sodium chloride    . sodium chloride 50 mL/hr at 02/13/20 0009  . sodium chloride    . sodium chloride     PRN Meds: sodium chloride, sodium chloride, acetaminophen **OR** acetaminophen, ondansetron **OR** ondansetron (ZOFRAN) IV, polyethylene glycol, sodium chloride flush, sodium chloride flush   Vital Signs    Vitals:   02/12/20 1623 02/12/20 2039 02/13/20 0442 02/13/20 0500  BP:  (!) 152/64 (!) 162/56   Pulse:  61 76   Resp:  18 18   Temp:  98.2 F (36.8 C) 98 F (36.7 C)   TempSrc:  Oral Oral   SpO2:  93% 94%   Weight: 65.5 kg   66.1 kg  Height:        Intake/Output Summary (Last 24 hours) at 02/13/2020 1034 Last data filed at 02/13/2020 0500 Gross per 24 hour  Intake 860.63 ml  Output 600 ml  Net 260.63 ml   Last 3 Weights 02/13/2020 02/12/2020 02/12/2020  Weight (lbs) 145 lb 11.6 oz 144 lb 6.4 oz 142 lb 6.7 oz  Weight (kg) 66.1 kg 65.5 kg 64.6 kg      Telemetry    SR, PVCs, Venti Bigemny - Personally Reviewed  ECG    N/A  Physical Exam   GEN: No acute distress. Pt is blind.   Neck: No JVD Cardiac: Irregular, 3/6 systolic murmurs, rubs, or gallops.  Respiratory: Clear to auscultation  bilaterally. GI: Soft, nontender, non-distended  MS: No edema; No deformity. Neuro:  Nonfocal  Psych: Normal affect   Labs    High Sensitivity Troponin:   Recent Labs  Lab 02/10/20 0346 02/10/20 1207  TROPONINIHS 17 21*      Chemistry Recent Labs  Lab 02/11/20 0416 02/12/20 0530 02/13/20 0551  NA 141 139 142  K 3.7 3.6 3.5  CL 101 101 106  CO2 25 24 23   GLUCOSE 126* 114* 104*  BUN 48* 54* 52*  CREATININE 2.22* 2.52* 2.09*  CALCIUM 9.4 9.4 8.9  PROT 6.6 6.3* 5.9*  ALBUMIN 3.6 3.4* 3.3*  AST 29 28 25   ALT 60* 45* 37  ALKPHOS 139* 112 101  BILITOT 1.2 1.2 1.1  GFRNONAA 27* 23* 29*  ANIONGAP 15 14 13      Hematology Recent Labs  Lab 02/11/20 0416 02/12/20 0530 02/13/20 0551  WBC 10.1 9.5 9.4  RBC 4.13* 4.20* 3.96*  HGB 12.6* 12.7* 12.1*  HCT 38.5* 39.0 37.1*  MCV 93.2 92.9 93.7  MCH 30.5 30.2 30.6  MCHC 32.7 32.6 32.6  RDW 14.2 14.1 14.1  PLT 248 256 230    BNP Recent Labs  Lab 02/10/20 0346  BNP 1,699.8*     Radiology    VAS US CAROTID  Result Date: 02/11/2020 Carotid Arterial Duplex Study Indications:       Pre-TAVR. Risk Factors:      Hypertension. Comparison Study:  No prior studies. Performing Technologist: Oliver Hum RVT  Examination Guidelines: A complete evaluation includes B-mode imaging, spectral Doppler, color Doppler, and power Doppler as needed of all accessible portions of each vessel. Bilateral testing is considered an integral part of a complete examination. Limited examinations for reoccurring indications may be performed as noted.  Right Carotid Findings: +----------+--------+--------+--------+-----------------------+--------+           PSV cm/sEDV cm/sStenosisPlaque Description     Comments +----------+--------+--------+--------+-----------------------+--------+ CCA Prox  46      3               smooth and heterogenous         +----------+--------+--------+--------+-----------------------+--------+ CCA Distal55       7               smooth and heterogenous         +----------+--------+--------+--------+-----------------------+--------+ ICA Prox  85      8               calcific                        +----------+--------+--------+--------+-----------------------+--------+ ICA Distal118     12                                     tortuous +----------+--------+--------+--------+-----------------------+--------+ ECA       148     1                                               +----------+--------+--------+--------+-----------------------+--------+ +----------+--------+-------+--------+-------------------+           PSV cm/sEDV cmsDescribeArm Pressure (mmHG) +----------+--------+-------+--------+-------------------+ [id number]                                        +----------+--------+-------+--------+-------------------+ +---------+--------+--+--------+--+---------+ VertebralPSV cm/s42EDV cm/s12Antegrade +---------+--------+--+--------+--+---------+  Left Carotid Findings: +----------+--------+--------+--------+--------------------------+--------+           PSV cm/sEDV cm/sStenosisPlaque Description        Comments +----------+--------+--------+--------+--------------------------+--------+ CCA Prox  86      7               smooth and heterogenous            +----------+--------+--------+--------+--------------------------+--------+ CCA Distal79      10              smooth and heterogenous            +----------+--------+--------+--------+--------------------------+--------+ ICA Prox  113     20              irregular and heterogenous         +----------+--------+--------+--------+--------------------------+--------+ ICA Distal96      20  tortuous +----------+--------+--------+--------+--------------------------+--------+ ECA       90                                                          +----------+--------+--------+--------+--------------------------+--------+ +----------+--------+--------+--------+-------------------+           PSV cm/sEDV cm/sDescribeArm Pressure (mmHG) +----------+--------+--------+--------+-------------------+ Subclavian102                                         +----------+--------+--------+--------+-------------------+ +---------+--------+--+--------+--+---------+ VertebralPSV cm/s64EDV cm/s14Antegrade +---------+--------+--+--------+--+---------+   Summary: Right Carotid: Velocities in the right ICA are consistent with a 1-39% stenosis. Left Carotid: Velocities in the left ICA are consistent with a 1-39% stenosis. Vertebrals: Bilateral vertebral arteries demonstrate antegrade flow. *See table(s) above for measurements and observations.  Electronically signed by Harold Barban MD on 02/11/2020 at 9:40:11 PM.    Final     Cardiac Studies   Echo 02/10/20 IMPRESSIONS    1. Left ventricular ejection fraction, by estimation, is 60 to 65%. The  left ventricle has normal function. The left ventricle has no regional  wall motion abnormalities. There is mild left ventricular hypertrophy.  Left ventricular diastolic parameters  are consistent with Grade I diastolic dysfunction (impaired relaxation).  2. Right ventricular systolic function is normal. The right ventricular  size is normal. Tricuspid regurgitation signal is inadequate for assessing  PA pressure.  3. Moderate pleural effusion in the left lateral region.  4. The mitral valve is normal in structure. Mild mitral valve  regurgitation. No evidence of mitral stenosis.  5. The aortic valve is tricuspid. Aortic valve regurgitation is trivial.  Moderate to severe aortic valve stenosis.  6. The inferior vena cava is normal in size with greater than 50%  respiratory variability, suggesting right atrial pressure of 3 mmHg.   FINDINGS  Left Ventricle: Left ventricular ejection  fraction, by estimation, is 60  to 65%. The left ventricle has normal function. The left ventricle has no  regional wall motion abnormalities. The left ventricular internal cavity  size was normal in size. There is  mild left ventricular hypertrophy. Left ventricular diastolic parameters  are consistent with Grade I diastolic dysfunction (impaired relaxation).   Right Ventricle: The right ventricular size is normal.Right ventricular  systolic function is normal. Tricuspid regurgitation signal is inadequate  for assessing PA pressure. The tricuspid regurgitant velocity is 2.69 m/s,  and with an assumed right atrial  pressure of 3 mmHg, the estimated right ventricular systolic pressure is  02.4 mmHg.   Left Atrium: Left atrial size was normal in size.   Right Atrium: Right atrial size was normal in size.   Pericardium: There is no evidence of pericardial effusion.   Mitral Valve: The mitral valve is normal in structure. Mild mitral annular  calcification. Mild mitral valve regurgitation. No evidence of mitral  valve stenosis.   Tricuspid Valve: The tricuspid valve is normal in structure. Tricuspid  valve regurgitation is trivial. No evidence of tricuspid stenosis.   Aortic Valve: The aortic valve is tricuspid. Aortic valve regurgitation is  trivial. Aortic regurgitation PHT measures 658 msec. Moderate to severe  aortic stenosis is present. Aortic valve mean gradient measures 30.0 mmHg.  Aortic valve peak gradient  measures 45.5 mmHg. Aortic valve  area, by VTI measures 0.68 cm.   Pulmonic Valve: The pulmonic valve was normal in structure. Pulmonic valve  regurgitation is mild. No evidence of pulmonic stenosis.   Aorta: The aortic root is normal in size and structure.   Venous: The inferior vena cava is normal in size with greater than 50%  respiratory variability, suggesting right atrial pressure of 3 mmHg.   IAS/Shunts: No atrial level shunt detected by color flow Doppler.    Additional Comments: There is a moderate pleural effusion in the left  lateral region.     LEFT VENTRICLE  PLAX 2D  LVIDd:     3.80 cm  LVIDs:     2.20 cm  LV PW:     1.20 cm  LV IVS:    1.30 cm  LVOT diam:   1.90 cm  LV SV:     72  LV SV Index:  40  LVOT Area:   2.84 cm     RIGHT VENTRICLE  TAPSE (M-mode): 2.0 cm   LEFT ATRIUM       Index    RIGHT ATRIUM      Index  LA diam:    4.40 cm 2.41 cm/m RA Area:   15.30 cm  LA Vol (A2C):  45.7 ml 25.07 ml/m RA Volume:  37.50 ml 20.57 ml/m  LA Vol (A4C):  48.0 ml 26.33 ml/m  LA Biplane Vol: 50.8 ml 27.87 ml/m  AORTIC VALVE  AV Area (Vmax):  0.66 cm  AV Area (Vmean):  0.63 cm  AV Area (VTI):   0.68 cm  AV Vmax:      337.33 cm/s  AV Vmean:     260.667 cm/s  AV VTI:      1.062 m  AV Peak Grad:   45.5 mmHg  AV Mean Grad:   30.0 mmHg  LVOT Vmax:     79.03 cm/s  LVOT Vmean:    58.267 cm/s  LVOT VTI:     0.256 m  LVOT/AV VTI ratio: 0.24  AI PHT:      658 msec    AORTA  Ao Root diam: 2.80 cm  Ao Asc diam: 2.80 cm   MR PISA:    0.39 cm TRICUSPID VALVE  MR PISA Radius: 0.25 cm TR Peak grad:  28.9 mmHg              TR Vmax:    269.00 cm/s                SHUNTS              Systemic VTI: 0.26 m              Systemic Diam: 1.90 cm  Patient Profile     87 y.o. male with a PMH of moderate-severe aortic stenosis, HTN, HLD, BPH, chronic cystitis with recurrent UTI's, prostate cancer, and CKD stage 3b-4,now with CHF and critical AS.  Assessment & Plan    1. Aortic stenosis -Echo this admission showed Moderate to severe aortic stenosis with Aortic valve mean gradient measures 30.0 mmHg. Aortic valve peak gradient  measures 45.5 mmHg. Aortic valve area, by VTI measures 0.68 cm.  - Plan for R & L cath today>> watch renal function closely . Has follow up with  Dr. Angelena Form 02/18/20  2. Acute diastolic CHF - Likely due to #1. Presented with acute SOB and noted hypoxic.  - Treated with IV lasix 40mg  x 2 >> Po lasix 40mg  MWF>> held for  cath today   3. CKD IIIb-IV - Scr 2.09 today, improved from 2.52 yesterday  - Seem baseline 2.3-2.4  4. HTN - Elevated on arrival  - Remain elevated but improved - Management has been limited by CKD and trouble with LE edema on amlodipine - On hydralazine 50mg  BID and labetalol 100mg  BID>> adjust as outpatient  For questions or updates, please contact [place] Please consult [email or web address] for contact info under    Signed, Leanor Kail, PA  02/13/2020, 10:34 AM    I have seen and examined the patient along with Leanor Kail, PA .  I have reviewed the chart, notes and new data.  I agree with PA/NP's note.  Key new complaints: breathing comfortably Key examination changes: RRR w freq ectopy (PVCs), barely audible S2, late peaking aortic ejection murmur. Key new findings / data: creatinine improved back to baseline.  PLAN: For R and L heart cath today. This procedure has been fully reviewed with the patient and written informed consent has been obtained.. Unless filling pressures are still markedly elevated, we may elect to DC home directly form the [facility] after appropriate bedrest/hemostasis. Will arrange a follow up BMET next Monday and appropriate Structural Heart clinic follow up.  Sanda Klein, MD, [place] [phone] 02/13/2020, 11:52 AM

## 2020-02-13 NOTE — Interval H&P Note (Signed)
History and Physical Interval Note:  02/13/2020 4:29 PM  Alexander Mcdonald  has presented today for surgery, with the diagnosis of aortic stenosis.  The various methods of treatment have been discussed with the patient and family. After consideration of risks, benefits and other options for treatment, the patient has consented to  Procedure(s): RIGHT/LEFT HEART CATH AND CORONARY ANGIOGRAPHY (N/A) as a surgical intervention.  The patient's history has been reviewed, patient examined, no change in status, stable for surgery.  I have reviewed the patient's chart and labs.  Questions were answered to the patient's satisfaction.     Sherren Mocha

## 2020-02-13 NOTE — Progress Notes (Signed)
Received patient from [facility] via [place] alert and answer  Questions appropriately.  Skin warm and dry . resp even and unlabored, with a wet cough noted.    Pt denies any pain or discomfort at this time.  IVF infusing, pt on monitor and consent signed.  Pt waiting for cath procedure.

## 2020-02-16 ENCOUNTER — Encounter (HOSPITAL_COMMUNITY): Payer: Self-pay | Admitting: Cardiovascular Disease

## 2020-02-16 NOTE — Progress Notes (Signed)
[facility] Consult Note  Chief Complaint  Patient presents with   New Patient (Initial Visit)    Severe aortic stenosis   History of Present Illness: 87 yo male with history of BPH, carotid artery disease s/p left CEA, gout, hyperlipidemia, HTN, legal blindness (macular degeneration/glaucoma), prostate cancer, PVCs, chronic cystitis with recurrent UTIs, CKD stage 3 (baseline creatinine 2.2) and severe aortic stenosis who is being seeing today as a new consult, referred by Dr. Sallyanne Kuster, for further discussion regarding his aortic stenosis and possible TAVR. He has been followed in our office by Dr. Caryl Comes for bradycardia and PVCs. He was admitted to [facility] 02/10/20 with dyspnea and cough. He was found to be hypoxic, hypertensive and volume overloaded and was seen in consultation by Dr. Sallyanne Kuster. BNP 1699. He is known to have moderate aortic stenosis. Echo 02/10/20 with LVEF=60-65%, mild LVH. Normal RV systolic function. Mild MR. The aortic valve leaflets are thickened with limited leaflet excursion. Mean gradient 30 mmHg, peak gradient 45.5 mmHg, AVA 0.63 cm2, dimensionless index 0.24. Cardiac cath 02/13/20 with mild non-obstructive calcific CAD. Normal right heart pressures by cath with mean gradient 51 mmHg, peak gradient 42mmHg, AVA 0.76 cm2.   He tells me today that he has been having progressive dyspnea for the past year. He has no lower extremity edema, dizziness, near syncope or syncope. No chest pain. He has partial dentures with a bridge. The several teeth that he has remaining have no active issues. He lives in [place] with his daughter. He is retired from [employer]. He no longer drives due to his vision issues. He uses a walker at home. He has mild memory issues per his daughter who is with him today.   Primary Care Physician: Isaac Bliss, Rayford Halsted, MD Primary Cardiologist: Caryl Comes Referring Cardiologist: Croitoru  Past Medical History:  Diagnosis Date    ACUTE RENAL FAILURE W/LESION OF TUBULAR NECROSIS 01/04/2007   Aortic stenosis 08/27/2019   Bladder spasm 06/04/2014   BPH (benign prostatic hyperplasia) 06/04/2014   Bradycardia 06/04/2019   CAROTID ARTERY DISEASE 06/01/2009   DEPRESSION 07/25/2006   DIVERTICULOSIS OF COLON 02/01/2007   Glaucoma    GOUT 07/25/2006   HEMORRHOID, THROMBOSED 02/15/2009   HYPERCHOLESTEROLEMIA 07/25/2006   HYPERTENSION 07/25/2006   PROSTATE CANCER, HX OF 07/25/2006   PVC (premature ventricular contraction) 08/27/2019   Rosacea 09/28/2008   Unsteadiness 06/04/2019   WEIGHT LOSS 09/01/2009    Past Surgical History:  Procedure Laterality Date   BIOPSY  07/11/2017   Procedure: BIOPSY;  Surgeon: Gatha Mayer, MD;  Location: [facility];  Service: Endoscopy;;   BRONCHIAL BRUSHINGS  07/11/2017   Procedure: ESOPHAGEAL BRUSHINGS;  Surgeon: Gatha Mayer, MD;  Location: [facility];  Service: Endoscopy;;   CAROTID ENDARTERECTOMY     CATARACT EXTRACTION     ESOPHAGOGASTRODUODENOSCOPY (EGD) WITH PROPOFOL N/A 07/11/2017   Procedure: ESOPHAGOGASTRODUODENOSCOPY (EGD) WITH PROPOFOL;  Surgeon: Gatha Mayer, MD;  Location: [facility];  Service: Endoscopy;  Laterality: N/A;   RIGHT/LEFT HEART CATH AND CORONARY ANGIOGRAPHY N/A 02/13/2020   Procedure: RIGHT/LEFT HEART CATH AND CORONARY ANGIOGRAPHY;  Surgeon: Sherren Mocha, MD;  Location: [facility];  Service: Cardiovascular;  Laterality: N/A;    Current Outpatient Medications  Medication Sig Dispense Refill   acetaminophen (TYLENOL) 325 MG tablet Take 2 tablets (650 mg total) by mouth every 6 (six) hours as needed for mild pain (or Fever >/= 101). (Patient taking differently: Take 650 mg by mouth every 6 (  six) hours as needed for mild pain.)     apraclonidine (IOPIDINE) 0.5 % ophthalmic solution Place 1 drop into the left eye in the morning and at bedtime.      Ascorbic Acid (VITAMIN C) 1000 MG tablet Take 1,000 mg by mouth daily.     aspirin EC  81 MG tablet Take 1 tablet (81 mg total) by mouth daily.     atorvastatin (LIPITOR) 10 MG tablet TAKE 1 TABLET(10 MG) BY MOUTH DAILY (Patient taking differently: Take 10 mg by mouth daily. TAKE 1 TABLET(10 MG) BY MOUTH DAILY) 90 tablet 0   brinzolamide (AZOPT) 1 % ophthalmic suspension 1 drop into affected eye     cholecalciferol (VITAMIN D3) 25 MCG (1000 UNIT) tablet Take 1,000 Units by mouth daily.     fish oil-omega-3 fatty acids 1000 MG capsule Take 1 g by mouth daily.     furosemide (LASIX) 40 MG tablet Take 1 tablet (40 mg total) by mouth every Monday, Wednesday, and Friday. 30 tablet 1   hydrALAZINE (APRESOLINE) 50 MG tablet Take 1 tablet (50 mg total) by mouth 2 (two) times daily. 60 tablet 2   labetalol (NORMODYNE) 100 MG tablet TAKE 1 TABLET(100 MG) BY MOUTH TWICE DAILY (Patient taking differently: Take 100 mg by mouth 2 (two) times daily.) 180 tablet 2   latanoprost (XALATAN) 0.005 % ophthalmic solution Place 1 drop into the left eye at bedtime.     levocetirizine (XYZAL) 5 MG tablet Take 1 tablet (5 mg total) by mouth every evening. 90 tablet 0   levothyroxine (SYNTHROID) 50 MCG tablet TAKE 1 TABLET BY MOUTH DAILY (Patient taking differently: Take 50 mcg by mouth daily before breakfast. TAKE 1 TABLET BY MOUTH DAILY) 90 tablet 1   Magnesium 250 MG TABS Take 250 mg by mouth daily.     Melatonin 10 MG CAPS Take 10 mg by mouth at bedtime.     Multiple Vitamin (MULTIVITAMIN WITH MINERALS) TABS tablet Take 1 tablet by mouth daily. 30 tablet 0   Rhodiola 300 MG CAPS Take 300 mg by mouth daily.     zinc gluconate 50 MG tablet Take 50 mg by mouth daily.     No current facility-administered medications for this visit.    Allergies  Allergen Reactions   Shellfish Allergy Anaphylaxis    Pt is allergic to mussels    Brimonidine Itching    Itching and swelling red   Brinzolamide-Brimonidine Itching   Dorzolamide Hcl-Timolol Mal Other (See Comments)    Eye itch and redness    Timolol Itching    Itching rash under eyes.    Social History   Socioeconomic History   Marital status: Widowed    Spouse name: Not on file   Number of children: 3   Years of education: Not on file   Highest education level: Not on file  Occupational History   Occupation: Retired-[employer]  Tobacco Use   Smoking status: Former Smoker    Types: Cigarettes    Quit date: 01/17/1988    Years since quitting: 32.1   Smokeless tobacco: Never Used  Vaping Use   Vaping Use: Never used  Substance and Sexual Activity   Alcohol use: Yes    Alcohol/week: 7.0 standard drinks    Types: 7 Standard drinks or equivalent per week    Comment: beer   Drug use: No   Sexual activity: Never  Other Topics Concern   Not on file  Social History Narrative   Not  on file   Social Determinants of Health   Financial Resource Strain: Not on file  Food Insecurity: Not on file  Transportation Needs: Not on file  Physical Activity: Not on file  Stress: Not on file  Social Connections: Not on file  Intimate Partner Violence: Not on file    Family History  Problem Relation Age of Onset   Hypertension Father    Heart attack Father    Leukemia Sister     Review of Systems:  As stated in the HPI and otherwise negative.   BP 106/60    Pulse (!) 50    Ht 5\' 5"  (1.651 m)    Wt 148 lb (67.1 kg)    SpO2 97%    BMI 24.63 kg/m   Physical Examination: General: thin elderly male in NAD.  HEENT: OP clear, mucus membranes moist  SKIN: warm, dry. No rashes. Neuro: No focal deficits  Musculoskeletal: Muscle strength 5/5 all ext  Psychiatric: Mood and affect normal  Neck: No JVD, no carotid bruits, no thyromegaly, no lymphadenopathy.  Lungs:Clear bilaterally, no wheezes, rhonci, crackles Cardiovascular: Regular rate and rhythm. Loud, harsh, late peaking systolic murmur.  Abdomen:Soft. Bowel sounds present. Non-tender.  Extremities: No lower extremity edema. Pulses are 2 + in the  bilateral DP/PT.  EKG:  The ekg ordered 02/12/20 is personally reviewed by me and demonstrates sinus with PVCs, non-specific T wave abnormality  Echo 02/10/20: 1. Left ventricular ejection fraction, by estimation, is 60 to 65%. The  left ventricle has normal function. The left ventricle has no regional  wall motion abnormalities. There is mild left ventricular hypertrophy.  Left ventricular diastolic parameters  are consistent with Grade I diastolic dysfunction (impaired relaxation).  2. Right ventricular systolic function is normal. The right ventricular  size is normal. Tricuspid regurgitation signal is inadequate for assessing  PA pressure.  3. Moderate pleural effusion in the left lateral region.  4. The mitral valve is normal in structure. Mild mitral valve  regurgitation. No evidence of mitral stenosis.  5. The aortic valve is tricuspid. Aortic valve regurgitation is trivial.  Moderate to severe aortic valve stenosis.  6. The inferior vena cava is normal in size with greater than 50%  respiratory variability, suggesting right atrial pressure of 3 mmHg.   FINDINGS  Left Ventricle: Left ventricular ejection fraction, by estimation, is 60  to 65%. The left ventricle has normal function. The left ventricle has no  regional wall motion abnormalities. The left ventricular internal cavity  size was normal in size. There is  mild left ventricular hypertrophy. Left ventricular diastolic parameters  are consistent with Grade I diastolic dysfunction (impaired relaxation).   Right Ventricle: The right ventricular size is normal.Right ventricular  systolic function is normal. Tricuspid regurgitation signal is inadequate  for assessing PA pressure. The tricuspid regurgitant velocity is 2.69 m/s,  and with an assumed right atrial  pressure of 3 mmHg, the estimated right ventricular systolic pressure is  80.9 mmHg.   Left Atrium: Left atrial size was normal in size.   Right Atrium:  Right atrial size was normal in size.   Pericardium: There is no evidence of pericardial effusion.   Mitral Valve: The mitral valve is normal in structure. Mild mitral annular  calcification. Mild mitral valve regurgitation. No evidence of mitral  valve stenosis.   Tricuspid Valve: The tricuspid valve is normal in structure. Tricuspid  valve regurgitation is trivial. No evidence of tricuspid stenosis.   Aortic Valve: The aortic valve  is tricuspid. Aortic valve regurgitation is  trivial. Aortic regurgitation PHT measures 658 msec. Moderate to severe  aortic stenosis is present. Aortic valve mean gradient measures 30.0 mmHg.  Aortic valve peak gradient  measures 45.5 mmHg. Aortic valve area, by VTI measures 0.68 cm.   Pulmonic Valve: The pulmonic valve was normal in structure. Pulmonic valve  regurgitation is mild. No evidence of pulmonic stenosis.   Aorta: The aortic root is normal in size and structure.   Venous: The inferior vena cava is normal in size with greater than 50%  respiratory variability, suggesting right atrial pressure of 3 mmHg.   IAS/Shunts: No atrial level shunt detected by color flow Doppler.   Additional Comments: There is a moderate pleural effusion in the left  lateral region.     LEFT VENTRICLE  PLAX 2D  LVIDd:     3.80 cm  LVIDs:     2.20 cm  LV PW:     1.20 cm  LV IVS:    1.30 cm  LVOT diam:   1.90 cm  LV SV:     72  LV SV Index:  40  LVOT Area:   2.84 cm     RIGHT VENTRICLE  TAPSE (M-mode): 2.0 cm   LEFT ATRIUM       Index    RIGHT ATRIUM      Index  LA diam:    4.40 cm 2.41 cm/m RA Area:   15.30 cm  LA Vol (A2C):  45.7 ml 25.07 ml/m RA Volume:  37.50 ml 20.57 ml/m  LA Vol (A4C):  48.0 ml 26.33 ml/m  LA Biplane Vol: 50.8 ml 27.87 ml/m  AORTIC VALVE  AV Area (Vmax):  0.66 cm  AV Area (Vmean):  0.63 cm  AV Area (VTI):   0.68 cm  AV Vmax:      337.33 cm/s  AV Vmean:      260.667 cm/s  AV VTI:      1.062 m  AV Peak Grad:   45.5 mmHg  AV Mean Grad:   30.0 mmHg  LVOT Vmax:     79.03 cm/s  LVOT Vmean:    58.267 cm/s  LVOT VTI:     0.256 m  LVOT/AV VTI ratio: 0.24  AI PHT:      658 msec    AORTA  Ao Root diam: 2.80 cm  Ao Asc diam: 2.80 cm   MR PISA:    0.39 cm TRICUSPID VALVE  MR PISA Radius: 0.25 cm TR Peak grad:  28.9 mmHg              TR Vmax:    269.00 cm/s                SHUNTS              Systemic VTI: 0.26 m              Systemic Diam: 1.90 cm    Cardiac catheterization 02/13/20:  1. Mild diffuse calcific CAD without significant obstruction 2. Severe aortic stenosis with mean gradient 52 mmHg and AVA 0.76 square cm 3. Normal right heart pressures  Left Main  The vessel exhibits minimal luminal irregularities.  Left Anterior Descending  There is mild diffuse disease throughout the vessel.  First Diagonal Branch  1st Diag lesion is 40% stenosed.  Left Circumflex  The vessel exhibits minimal luminal irregularities.  Right Coronary Artery  There is mild diffuse disease throughout the vessel. dominant vessel, diffuse  plaquing without significant obstruction.   Intervention   No interventions have been documented.  Left Heart  Aortic Valve There is severe aortic valve stenosis. The aortic valve is calcified. There is restricted aortic valve motion. Mean transaortic gradient 52 mmHg, AVA 0.76   Coronary Diagrams   Diagnostic Dominance: Right    Intervention    Implants    No implant documentation for this case.    Syngo Images  Show images for CARDIAC CATHETERIZATION  Images on Long Term Storage  Show images for [place], [place] to Procedure Log  Procedure Log     Hemo Data  Flowsheet Row Most Recent Value  Fick Cardiac Output 4.66 L/min  Fick Cardiac Output Index 2.69 (L/min)/BSA  Aortic  Mean Gradient 51.48 mmHg  Aortic Peak Gradient 47 mmHg  Aortic Valve Area 0.76  Aortic Value Area Index 0.44 cm2/BSA  RA A Wave 2 mmHg  RA V Wave 9 mmHg  RA Mean 5 mmHg  RV Systolic Pressure 41 mmHg  RV Diastolic Pressure 0 mmHg  RV EDP 3 mmHg  PA Systolic Pressure 42 mmHg  PA Diastolic Pressure 14 mmHg  PA Mean 25 mmHg  PW A Wave 22 mmHg  PW V Wave 11 mmHg  PW Mean 9 mmHg  AO Systolic Pressure 093 mmHg  AO Diastolic Pressure 33 mmHg  AO Mean 75 mmHg  LV Systolic Pressure 235 mmHg  LV Diastolic Pressure 3 mmHg  LV EDP 15 mmHg  AOp Systolic Pressure 573 mmHg  AOp Diastolic Pressure 36 mmHg  AOp Mean Pressure 73 mmHg  LVp Systolic Pressure 220 mmHg  LVp Diastolic Pressure 4 mmHg  LVp EDP Pressure 13 mmHg  QP/QS 1  TPVR Index 9.28 HRUI  TSVR Index 27.81 HRUI  PVR SVR Ratio 0.23  TPVR/TSVR Ratio 0.33     Recent Labs: 02/10/2020: B Natriuretic Peptide 1,699.8; Magnesium 2.3; TSH 4.281 02/13/2020: ALT 37; BUN 52; Creatinine, Ser 2.09; Hemoglobin 11.9; Platelets 230; Potassium 3.7; Sodium 143   Lipid Panel    Component Value Date/Time   CHOL 193 01/02/2019 1449   TRIG 231.0 (H) 01/02/2019 1449   HDL 51.70 01/02/2019 1449   CHOLHDL 4 01/02/2019 1449   VLDL 46.2 (H) 01/02/2019 1449   LDLCALC 100 (H) 11/30/2016 1155   LDLDIRECT 104.0 01/02/2019 1449     Wt Readings from Last 3 Encounters:  02/18/20 148 lb (67.1 kg)  02/13/20 145 lb 11.6 oz (66.1 kg)  11/21/19 153 lb (69.4 kg)     Other studies Reviewed: Additional studies/ records that were reviewed today include: Cath images, echo images, EKG, office notes, hospital notes.  Review of the above records demonstrates: severe AS   Assessment and Plan:   1. Severe Aortic Valve Stenosis: He has severe, stage D1 aortic valve stenosis (mean gradient by cath is 51 mmHg). I have personally reviewed the echo images. The aortic valve is thickened, calcified with limited leaflet mobility. There is mild aortic insufficiency. I  think he would benefit from AVR. Given advanced age, he would not be considered a candidate for conventional AVR by surgical approach under any circumstances. He is a borderline candidate for any invasive procedures. I think he may be a candidate for TAVR but will need to review with our team given his advanced age and borderline functional status. He was seen last week by Dr. Sallyanne Kuster who feels that he should be considered for TAVR.   STS Risk Score: Risk of Mortality: 5.137% Renal Failure: 9.042%  Permanent Stroke: 2.137% Prolonged Ventilation: 10.521% DSW Infection: 0.095% Reoperation: 4.470% Morbidity or Mortality: 23.387% Short Length of Stay: 19.540% Long Length of Stay: 11.057%   I have reviewed the natural history of aortic stenosis with the patient and their family members  who are present today. We have discussed the limitations of medical therapy and the poor prognosis associated with symptomatic aortic stenosis. We have reviewed potential treatment options, including palliative medical therapy, conventional surgical aortic valve replacement, and transcatheter aortic valve replacement. We discussed treatment options in the context of the patient's specific comorbid medical conditions.   He would like to proceed with planning for TAVR. Risks and benefits of the valve procedure are reviewed with the patient. We will arrange a cardiac CT, CTA of the chest/abdomen and pelvis and he will then be referred to see one of the CT surgeons on our TAVR team.   BMET and CBC  today     Current medicines are reviewed at length with the patient today.  The patient does not have concerns regarding medicines.  The following changes have been made:  no change  Labs/ tests ordered today include:   Orders Placed This Encounter  Procedures   CBC   Basic metabolic panel     Disposition:   FU with the valve team.    Signed, Lauree Chandler, MD 02/18/2020 10:16 AM    [address], [place], [place]  [address] Phone: [phone]; Fax: [phone]

## 2020-02-18 ENCOUNTER — Encounter: Payer: Self-pay | Admitting: Cardiovascular Disease

## 2020-02-18 ENCOUNTER — Other Ambulatory Visit: Payer: Self-pay

## 2020-02-18 ENCOUNTER — Ambulatory Visit (INDEPENDENT_AMBULATORY_CARE_PROVIDER_SITE_OTHER): Payer: Medicare Other | Admitting: Cardiovascular Disease

## 2020-02-18 VITALS — BP 106/60 | HR 50 | Ht 65.0 in | Wt 148.0 lb

## 2020-02-18 DIAGNOSIS — I350 Nonrheumatic aortic (valve) stenosis: Secondary | ICD-10-CM | POA: Diagnosis not present

## 2020-02-18 DIAGNOSIS — I10 Essential (primary) hypertension: Secondary | ICD-10-CM

## 2020-02-18 LAB — BASIC METABOLIC PANEL
BUN/Creatinine Ratio: 23 (ref 10–24)
BUN: 83 mg/dL (ref 10–36)
CO2: 19 mmol/L — ABNORMAL LOW (ref 20–29)
Calcium: 8.9 mg/dL (ref 8.6–10.2)
Chloride: 93 mmol/L — ABNORMAL LOW (ref 96–106)
Creatinine, Ser: 3.56 mg/dL — ABNORMAL HIGH (ref 0.76–1.27)
GFR calc Af Amer: 16 mL/min/{1.73_m2} — ABNORMAL LOW (ref >59)
GFR calc non Af Amer: 14 mL/min/{1.73_m2} — ABNORMAL LOW (ref >59)
Glucose: 140 mg/dL — ABNORMAL HIGH (ref 65–99)
Potassium: 3.8 mmol/L (ref 3.5–5.2)
Sodium: 132 mmol/L — ABNORMAL LOW (ref 134–144)

## 2020-02-18 LAB — CBC
Hematocrit: 34.4 % — ABNORMAL LOW (ref 37.5–51.0)
Hemoglobin: 11.9 g/dL — ABNORMAL LOW (ref 13.0–17.7)
MCH: 30.3 pg (ref 26.6–33.0)
MCHC: 34.6 g/dL (ref 31.5–35.7)
MCV: 88 fL (ref 79–97)
Platelets: 277 10*3/uL (ref 150–450)
RBC: 3.93 x10E6/uL — ABNORMAL LOW (ref 4.14–5.80)
RDW: 13.1 % (ref 11.6–15.4)
WBC: 23.8 10*3/uL (ref 3.4–10.8)

## 2020-02-18 NOTE — Patient Instructions (Signed)
Medication Instructions:  No changes *If you need a refill on your cardiac medications before your next appointment, please call your pharmacy*   Lab Work: Today: cbc, bmet If you have labs (blood work) drawn today and your tests are completely normal, you will receive your results only by: Marland Kitchen MyChart Message (if you have MyChart) OR . A paper copy in the mail If you have any lab test that is abnormal or we need to change your treatment, we will call you to review the results.   Testing/Procedures: Theodosia Quay, RN Structural Heart Nurse Navigator will contact you to arrange CT scans.   Other Instructions

## 2020-02-19 ENCOUNTER — Telehealth: Payer: Self-pay

## 2020-02-19 ENCOUNTER — Inpatient Hospital Stay (HOSPITAL_COMMUNITY)
Admission: EM | Admit: 2020-02-19 | Discharge: 2020-03-16 | DRG: 682 | Disposition: E | Payer: Medicare Other | Attending: Internal Medicine | Admitting: Internal Medicine

## 2020-02-19 ENCOUNTER — Observation Stay (HOSPITAL_COMMUNITY): Payer: Medicare Other

## 2020-02-19 ENCOUNTER — Emergency Department (HOSPITAL_COMMUNITY): Payer: Medicare Other

## 2020-02-19 DIAGNOSIS — A419 Sepsis, unspecified organism: Secondary | ICD-10-CM | POA: Diagnosis not present

## 2020-02-19 DIAGNOSIS — H543 Unqualified visual loss, both eyes: Secondary | ICD-10-CM | POA: Diagnosis present

## 2020-02-19 DIAGNOSIS — I10 Essential (primary) hypertension: Secondary | ICD-10-CM | POA: Diagnosis present

## 2020-02-19 DIAGNOSIS — K5730 Diverticulosis of large intestine without perforation or abscess without bleeding: Secondary | ICD-10-CM | POA: Diagnosis not present

## 2020-02-19 DIAGNOSIS — J90 Pleural effusion, not elsewhere classified: Secondary | ICD-10-CM | POA: Diagnosis not present

## 2020-02-19 DIAGNOSIS — U071 COVID-19: Secondary | ICD-10-CM | POA: Diagnosis not present

## 2020-02-19 DIAGNOSIS — H3530 Unspecified macular degeneration: Secondary | ICD-10-CM | POA: Diagnosis present

## 2020-02-19 DIAGNOSIS — J9601 Acute respiratory failure with hypoxia: Secondary | ICD-10-CM | POA: Diagnosis present

## 2020-02-19 DIAGNOSIS — N179 Acute kidney failure, unspecified: Secondary | ICD-10-CM | POA: Diagnosis not present

## 2020-02-19 DIAGNOSIS — R001 Bradycardia, unspecified: Secondary | ICD-10-CM | POA: Diagnosis not present

## 2020-02-19 DIAGNOSIS — F419 Anxiety disorder, unspecified: Secondary | ICD-10-CM | POA: Diagnosis present

## 2020-02-19 DIAGNOSIS — Z743 Need for continuous supervision: Secondary | ICD-10-CM | POA: Diagnosis not present

## 2020-02-19 DIAGNOSIS — Z7982 Long term (current) use of aspirin: Secondary | ICD-10-CM

## 2020-02-19 DIAGNOSIS — J1282 Pneumonia due to coronavirus disease 2019: Secondary | ICD-10-CM | POA: Diagnosis not present

## 2020-02-19 DIAGNOSIS — H409 Unspecified glaucoma: Secondary | ICD-10-CM | POA: Diagnosis present

## 2020-02-19 DIAGNOSIS — Z87440 Personal history of urinary (tract) infections: Secondary | ICD-10-CM

## 2020-02-19 DIAGNOSIS — E785 Hyperlipidemia, unspecified: Secondary | ICD-10-CM | POA: Diagnosis present

## 2020-02-19 DIAGNOSIS — Z8711 Personal history of peptic ulcer disease: Secondary | ICD-10-CM

## 2020-02-19 DIAGNOSIS — J189 Pneumonia, unspecified organism: Secondary | ICD-10-CM | POA: Diagnosis not present

## 2020-02-19 DIAGNOSIS — Z66 Do not resuscitate: Secondary | ICD-10-CM

## 2020-02-19 DIAGNOSIS — N184 Chronic kidney disease, stage 4 (severe): Secondary | ICD-10-CM | POA: Diagnosis not present

## 2020-02-19 DIAGNOSIS — Y95 Nosocomial condition: Secondary | ICD-10-CM | POA: Diagnosis present

## 2020-02-19 DIAGNOSIS — E7800 Pure hypercholesterolemia, unspecified: Secondary | ICD-10-CM | POA: Diagnosis present

## 2020-02-19 DIAGNOSIS — I130 Hypertensive heart and chronic kidney disease with heart failure and stage 1 through stage 4 chronic kidney disease, or unspecified chronic kidney disease: Secondary | ICD-10-CM | POA: Diagnosis not present

## 2020-02-19 DIAGNOSIS — C61 Malignant neoplasm of prostate: Secondary | ICD-10-CM | POA: Diagnosis present

## 2020-02-19 DIAGNOSIS — Z79899 Other long term (current) drug therapy: Secondary | ICD-10-CM

## 2020-02-19 DIAGNOSIS — R7401 Elevation of levels of liver transaminase levels: Secondary | ICD-10-CM | POA: Diagnosis not present

## 2020-02-19 DIAGNOSIS — E039 Hypothyroidism, unspecified: Secondary | ICD-10-CM | POA: Diagnosis present

## 2020-02-19 DIAGNOSIS — Z8249 Family history of ischemic heart disease and other diseases of the circulatory system: Secondary | ICD-10-CM

## 2020-02-19 DIAGNOSIS — K5900 Constipation, unspecified: Secondary | ICD-10-CM | POA: Diagnosis not present

## 2020-02-19 DIAGNOSIS — R6520 Severe sepsis without septic shock: Secondary | ICD-10-CM | POA: Diagnosis not present

## 2020-02-19 DIAGNOSIS — D631 Anemia in chronic kidney disease: Secondary | ICD-10-CM | POA: Diagnosis present

## 2020-02-19 DIAGNOSIS — Z87891 Personal history of nicotine dependence: Secondary | ICD-10-CM

## 2020-02-19 DIAGNOSIS — R627 Adult failure to thrive: Secondary | ICD-10-CM | POA: Diagnosis present

## 2020-02-19 DIAGNOSIS — R0902 Hypoxemia: Secondary | ICD-10-CM

## 2020-02-19 DIAGNOSIS — R000 Tachycardia, unspecified: Secondary | ICD-10-CM | POA: Diagnosis not present

## 2020-02-19 DIAGNOSIS — Z515 Encounter for palliative care: Secondary | ICD-10-CM | POA: Diagnosis not present

## 2020-02-19 DIAGNOSIS — I5032 Chronic diastolic (congestive) heart failure: Secondary | ICD-10-CM | POA: Diagnosis present

## 2020-02-19 DIAGNOSIS — J811 Chronic pulmonary edema: Secondary | ICD-10-CM | POA: Diagnosis not present

## 2020-02-19 DIAGNOSIS — Z79890 Hormone replacement therapy (postmenopausal): Secondary | ICD-10-CM

## 2020-02-19 DIAGNOSIS — E440 Moderate protein-calorie malnutrition: Secondary | ICD-10-CM | POA: Diagnosis present

## 2020-02-19 DIAGNOSIS — I491 Atrial premature depolarization: Secondary | ICD-10-CM | POA: Diagnosis not present

## 2020-02-19 DIAGNOSIS — Z91013 Allergy to seafood: Secondary | ICD-10-CM

## 2020-02-19 DIAGNOSIS — Z806 Family history of leukemia: Secondary | ICD-10-CM

## 2020-02-19 DIAGNOSIS — F0390 Unspecified dementia without behavioral disturbance: Secondary | ICD-10-CM | POA: Diagnosis present

## 2020-02-19 DIAGNOSIS — R9431 Abnormal electrocardiogram [ECG] [EKG]: Secondary | ICD-10-CM | POA: Diagnosis not present

## 2020-02-19 DIAGNOSIS — E875 Hyperkalemia: Secondary | ICD-10-CM | POA: Diagnosis not present

## 2020-02-19 DIAGNOSIS — R110 Nausea: Secondary | ICD-10-CM | POA: Diagnosis not present

## 2020-02-19 DIAGNOSIS — Z6825 Body mass index (BMI) 25.0-25.9, adult: Secondary | ICD-10-CM

## 2020-02-19 DIAGNOSIS — J918 Pleural effusion in other conditions classified elsewhere: Secondary | ICD-10-CM | POA: Diagnosis present

## 2020-02-19 DIAGNOSIS — Z9889 Other specified postprocedural states: Secondary | ICD-10-CM

## 2020-02-19 DIAGNOSIS — N401 Enlarged prostate with lower urinary tract symptoms: Secondary | ICD-10-CM | POA: Diagnosis present

## 2020-02-19 DIAGNOSIS — E871 Hypo-osmolality and hyponatremia: Secondary | ICD-10-CM | POA: Diagnosis not present

## 2020-02-19 DIAGNOSIS — D72829 Elevated white blood cell count, unspecified: Secondary | ICD-10-CM | POA: Diagnosis not present

## 2020-02-19 DIAGNOSIS — I350 Nonrheumatic aortic (valve) stenosis: Secondary | ICD-10-CM

## 2020-02-19 DIAGNOSIS — I959 Hypotension, unspecified: Secondary | ICD-10-CM | POA: Diagnosis not present

## 2020-02-19 DIAGNOSIS — R112 Nausea with vomiting, unspecified: Secondary | ICD-10-CM | POA: Diagnosis not present

## 2020-02-19 DIAGNOSIS — R338 Other retention of urine: Secondary | ICD-10-CM | POA: Diagnosis present

## 2020-02-19 DIAGNOSIS — M109 Gout, unspecified: Secondary | ICD-10-CM | POA: Diagnosis present

## 2020-02-19 DIAGNOSIS — I493 Ventricular premature depolarization: Secondary | ICD-10-CM | POA: Diagnosis not present

## 2020-02-19 DIAGNOSIS — K223 Perforation of esophagus: Secondary | ICD-10-CM

## 2020-02-19 DIAGNOSIS — K2210 Ulcer of esophagus without bleeding: Secondary | ICD-10-CM | POA: Diagnosis present

## 2020-02-19 DIAGNOSIS — R7989 Other specified abnormal findings of blood chemistry: Secondary | ICD-10-CM

## 2020-02-19 DIAGNOSIS — Z888 Allergy status to other drugs, medicaments and biological substances status: Secondary | ICD-10-CM

## 2020-02-19 LAB — I-STAT VENOUS BLOOD GAS, ED
Acid-base deficit: 7 mmol/L — ABNORMAL HIGH (ref 0.0–2.0)
Bicarbonate: 18.5 mmol/L — ABNORMAL LOW (ref 20.0–28.0)
Calcium, Ion: 0.99 mmol/L — ABNORMAL LOW (ref 1.15–1.40)
HCT: 28 % — ABNORMAL LOW (ref 39.0–52.0)
Hemoglobin: 9.5 g/dL — ABNORMAL LOW (ref 13.0–17.0)
O2 Saturation: 93 %
Potassium: 3.2 mmol/L — ABNORMAL LOW (ref 3.5–5.1)
Sodium: 132 mmol/L — ABNORMAL LOW (ref 135–145)
TCO2: 20 mmol/L — ABNORMAL LOW (ref 22–32)
pCO2, Ven: 37.8 mmHg — ABNORMAL LOW (ref 44.0–60.0)
pH, Ven: 7.297 (ref 7.250–7.430)
pO2, Ven: 73 mmHg — ABNORMAL HIGH (ref 32.0–45.0)

## 2020-02-19 LAB — COMPREHENSIVE METABOLIC PANEL
ALT: 210 U/L — ABNORMAL HIGH (ref 0–44)
AST: 184 U/L — ABNORMAL HIGH (ref 15–41)
Albumin: 3.1 g/dL — ABNORMAL LOW (ref 3.5–5.0)
Alkaline Phosphatase: 309 U/L — ABNORMAL HIGH (ref 38–126)
Anion gap: 19 — ABNORMAL HIGH (ref 5–15)
BUN: 101 mg/dL — ABNORMAL HIGH (ref 8–23)
CO2: 18 mmol/L — ABNORMAL LOW (ref 22–32)
Calcium: 9.3 mg/dL (ref 8.9–10.3)
Chloride: 93 mmol/L — ABNORMAL LOW (ref 98–111)
Creatinine, Ser: 4.43 mg/dL — ABNORMAL HIGH (ref 0.61–1.24)
GFR, Estimated: 12 mL/min — ABNORMAL LOW (ref >60)
Glucose, Bld: 117 mg/dL — ABNORMAL HIGH (ref 70–99)
Potassium: 4 mmol/L (ref 3.5–5.1)
Sodium: 130 mmol/L — ABNORMAL LOW (ref 135–145)
Total Bilirubin: 1.4 mg/dL — ABNORMAL HIGH (ref 0.3–1.2)
Total Protein: 6.5 g/dL (ref 6.5–8.1)

## 2020-02-19 LAB — CBC
HCT: 36.8 % — ABNORMAL LOW (ref 39.0–52.0)
Hemoglobin: 12.2 g/dL — ABNORMAL LOW (ref 13.0–17.0)
MCH: 30.7 pg (ref 26.0–34.0)
MCHC: 33.2 g/dL (ref 30.0–36.0)
MCV: 92.5 fL (ref 80.0–100.0)
Platelets: 282 10*3/uL (ref 150–400)
RBC: 3.98 MIL/uL — ABNORMAL LOW (ref 4.22–5.81)
RDW: 14.5 % (ref 11.5–15.5)
WBC: 21.1 10*3/uL — ABNORMAL HIGH (ref 4.0–10.5)
nRBC: 0 % (ref 0.0–0.2)

## 2020-02-19 LAB — TROPONIN I (HIGH SENSITIVITY)
Troponin I (High Sensitivity): 60 ng/L — ABNORMAL HIGH (ref <18)
Troponin I (High Sensitivity): 54 ng/L — ABNORMAL HIGH (ref <18)

## 2020-02-19 LAB — BRAIN NATRIURETIC PEPTIDE: B Natriuretic Peptide: 748.1 pg/mL — ABNORMAL HIGH (ref 0.0–100.0)

## 2020-02-19 LAB — LACTIC ACID, PLASMA
Lactic Acid, Venous: 1.7 mmol/L (ref 0.5–1.9)
Lactic Acid, Venous: 1 mmol/L (ref 0.5–1.9)

## 2020-02-19 LAB — PROTIME-INR
INR: 1.2 (ref 0.8–1.2)
Prothrombin Time: 14.4 seconds (ref 11.4–15.2)

## 2020-02-19 LAB — SARS CORONAVIRUS 2 BY RT PCR (HOSPITAL ORDER, PERFORMED IN ~~LOC~~ HOSPITAL LAB): SARS Coronavirus 2: NEGATIVE

## 2020-02-19 LAB — APTT: aPTT: 36 seconds (ref 24–36)

## 2020-02-19 LAB — D-DIMER, QUANTITATIVE: D-Dimer, Quant: 2.44 ug/mL-FEU — ABNORMAL HIGH (ref 0.00–0.50)

## 2020-02-19 MED ORDER — LATANOPROST 0.005 % OP SOLN
1.0000 [drp] | Freq: Every day | OPHTHALMIC | Status: DC
  Administered 2020-02-20 – 2020-02-28 (×9): 1 [drp] via OPHTHALMIC
  Filled 2020-02-19 (×2): qty 2.5

## 2020-02-19 MED ORDER — ACETAMINOPHEN 650 MG RE SUPP: 650.0000 mg | Freq: Four times a day (QID) | RECTAL | Status: DC | PRN

## 2020-02-19 MED ORDER — LEVOCETIRIZINE DIHYDROCHLORIDE 5 MG PO TABS: 5.0000 mg | ORAL_TABLET | Freq: Every evening | ORAL | Status: DC

## 2020-02-19 MED ORDER — LORATADINE 10 MG PO TABS
10.0000 mg | ORAL_TABLET | Freq: Every day | ORAL | Status: DC
  Administered 2020-02-19 – 2020-02-27 (×9): 10 mg via ORAL
  Filled 2020-02-19 (×10): qty 1

## 2020-02-19 MED ORDER — HYDRALAZINE HCL 50 MG PO TABS
50.0000 mg | ORAL_TABLET | Freq: Two times a day (BID) | ORAL | Status: DC
  Administered 2020-02-19 – 2020-02-26 (×14): 50 mg via ORAL
  Filled 2020-02-19 (×13): qty 1
  Filled 2020-02-19: qty 2

## 2020-02-19 MED ORDER — ASCORBIC ACID 500 MG PO TABS
1000.0000 mg | ORAL_TABLET | Freq: Every day | ORAL | Status: DC
  Administered 2020-02-19 – 2020-02-27 (×9): 1000 mg via ORAL
  Filled 2020-02-19 (×10): qty 2

## 2020-02-19 MED ORDER — ONDANSETRON HCL 4 MG/2ML IJ SOLN: 4.0000 mg | Freq: Four times a day (QID) | INTRAMUSCULAR | Status: DC | PRN

## 2020-02-19 MED ORDER — SODIUM CHLORIDE 0.9 % IV BOLUS
500.0000 mL | Freq: Once | INTRAVENOUS | Status: AC
  Administered 2020-02-19: 500 mL via INTRAVENOUS

## 2020-02-19 MED ORDER — ASPIRIN EC 81 MG PO TBEC
81.0000 mg | DELAYED_RELEASE_TABLET | Freq: Every day | ORAL | Status: DC
  Administered 2020-02-19 – 2020-02-27 (×9): 81 mg via ORAL
  Filled 2020-02-19 (×10): qty 1

## 2020-02-19 MED ORDER — SODIUM CHLORIDE 0.9 % IV SOLN
2.0000 g | Freq: Once | INTRAVENOUS | Status: AC
  Administered 2020-02-19: 2 g via INTRAVENOUS
  Filled 2020-02-19: qty 2

## 2020-02-19 MED ORDER — RHODIOLA 300 MG PO CAPS: 300.0000 mg | ORAL_CAPSULE | Freq: Every day | ORAL | Status: DC

## 2020-02-19 MED ORDER — SODIUM CHLORIDE 0.9 % IV SOLN
500.0000 mg | INTRAVENOUS | Status: AC
  Administered 2020-02-19 – 2020-02-23 (×5): 500 mg via INTRAVENOUS
  Filled 2020-02-19 (×6): qty 500

## 2020-02-19 MED ORDER — ONDANSETRON HCL 4 MG PO TABS: 4.0000 mg | ORAL_TABLET | Freq: Four times a day (QID) | ORAL | Status: DC | PRN

## 2020-02-19 MED ORDER — SODIUM CHLORIDE 0.9 % IV SOLN
2.0000 g | INTRAVENOUS | Status: AC
  Administered 2020-02-19 – 2020-02-23 (×5): 2 g via INTRAVENOUS
  Filled 2020-02-19 (×4): qty 20
  Filled 2020-02-19: qty 2
  Filled 2020-02-19: qty 20

## 2020-02-19 MED ORDER — VANCOMYCIN VARIABLE DOSE PER UNSTABLE RENAL FUNCTION (PHARMACIST DOSING): Status: DC

## 2020-02-19 MED ORDER — ACETAMINOPHEN 325 MG PO TABS
650.0000 mg | ORAL_TABLET | Freq: Four times a day (QID) | ORAL | Status: DC | PRN
  Administered 2020-02-20 – 2020-02-22 (×5): 650 mg via ORAL
  Filled 2020-02-19 (×5): qty 2

## 2020-02-19 MED ORDER — LABETALOL HCL 200 MG PO TABS: 100.0000 mg | ORAL_TABLET | Freq: Two times a day (BID) | ORAL | Status: DC

## 2020-02-19 MED ORDER — APRACLONIDINE HCL 0.5 % OP SOLN
1.0000 [drp] | Freq: Three times a day (TID) | OPHTHALMIC | Status: DC
  Administered 2020-02-21 – 2020-02-29 (×24): 1 [drp] via OPHTHALMIC
  Filled 2020-02-19 (×3): qty 5

## 2020-02-19 MED ORDER — VANCOMYCIN HCL 1250 MG/250ML IV SOLN
1250.0000 mg | Freq: Once | INTRAVENOUS | Status: AC
  Administered 2020-02-19: 1250 mg via INTRAVENOUS
  Filled 2020-02-19: qty 250

## 2020-02-19 MED ORDER — MELATONIN 5 MG PO TABS
10.0000 mg | ORAL_TABLET | Freq: Every day | ORAL | Status: DC
  Administered 2020-02-19 – 2020-02-26 (×8): 10 mg via ORAL
  Filled 2020-02-19 (×9): qty 2

## 2020-02-19 MED ORDER — ATORVASTATIN CALCIUM 10 MG PO TABS
10.0000 mg | ORAL_TABLET | Freq: Every day | ORAL | Status: DC
  Administered 2020-02-19 – 2020-02-25 (×7): 10 mg via ORAL
  Filled 2020-02-19 (×7): qty 1

## 2020-02-19 MED ORDER — HEPARIN SODIUM (PORCINE) 5000 UNIT/ML IJ SOLN
5000.0000 [IU] | Freq: Three times a day (TID) | INTRAMUSCULAR | Status: DC
  Administered 2020-02-19 – 2020-02-28 (×27): 5000 [IU] via SUBCUTANEOUS
  Filled 2020-02-19 (×27): qty 1

## 2020-02-19 MED ORDER — SODIUM CHLORIDE 0.9 % IV SOLN: INTRAVENOUS | Status: AC

## 2020-02-19 MED ORDER — ZINC GLUCONATE 50 MG PO TABS: 50.0000 mg | ORAL_TABLET | Freq: Every day | ORAL | Status: DC

## 2020-02-19 MED ORDER — LEVOTHYROXINE SODIUM 50 MCG PO TABS
50.0000 ug | ORAL_TABLET | Freq: Every day | ORAL | Status: DC
  Administered 2020-02-20 – 2020-02-27 (×8): 50 ug via ORAL
  Filled 2020-02-19 (×8): qty 1

## 2020-02-19 MED ORDER — ENOXAPARIN SODIUM 40 MG/0.4ML ~~LOC~~ SOLN: 40.0000 mg | SUBCUTANEOUS | Status: DC

## 2020-02-19 MED ORDER — MAGNESIUM OXIDE 400 (241.3 MG) MG PO TABS
200.0000 mg | ORAL_TABLET | Freq: Every day | ORAL | Status: DC
  Administered 2020-02-20 – 2020-02-27 (×8): 200 mg via ORAL
  Filled 2020-02-19 (×11): qty 1

## 2020-02-19 NOTE — ED Notes (Signed)
Patient transported to CT 

## 2020-02-19 NOTE — ED Provider Notes (Signed)
[facility] EMERGENCY DEPARTMENT Provider Note   CSN: [id number] Arrival date & time: 03/07/2020  1210     History Chief Complaint  Patient presents with  . Abnormal Lab    Alexander Mcdonald is a 87 y.o. male who was recently admitted for hypoxia to 84% on room air, secondary to acute on chronic heart failure with preserved ejection fraction.  Additionally history of aortic stenosis pending TAVR in the next few weeks; additionally had AKI on stage III CKD.  Patient was discharged on 02/13/2020 and instructed to follow-up for repeat laboratory studies this week patient was started on new Lasix prescription.  Patient returns today at the prompting of his cardiologist, as he had repeat laboratory studies drawn yesterday that revealed new AKI and white count of 23.8.  Patient unable to provide good history, due to underlying mental status.  States he has been nauseous "forever", specifically since his discharge last week.  When asked what other symptoms he is having, and he states that he does not feel well, but is unable to provide any more detailed explanation.  I personally reviewed this patient's medical records.  Patient has history of heart failure with preserved ejection fraction; echocardiogram on 02/10/2020 with LVEF 60 to 65%, patient also had heart cath on 02/13/2020.  Patient appears to be a vasculopath, history of carotid and to me and is awaiting TAVR.  Patient hypothyroidism as well as hypertension, and macular degeneration.  He is functionally blind in both eyes.  Additionally has stage III CKD, and untreated prostate cancer.  Level 5 Caveat - patient unable to provide history due to underlying dementia.  HPI     Past Medical History:  Diagnosis Date  . ACUTE RENAL FAILURE W/LESION OF TUBULAR NECROSIS 01/04/2007  . Aortic stenosis 08/27/2019  . Bladder spasm 06/04/2014  . BPH (benign prostatic hyperplasia) 06/04/2014  . Bradycardia 06/04/2019  . CAROTID ARTERY  DISEASE 06/01/2009  . DEPRESSION 07/25/2006  . DIVERTICULOSIS OF COLON 02/01/2007  . Glaucoma   . GOUT 07/25/2006  . HEMORRHOID, THROMBOSED 02/15/2009  . HYPERCHOLESTEROLEMIA 07/25/2006  . HYPERTENSION 07/25/2006  . PROSTATE CANCER, HX OF 07/25/2006  . PVC (premature ventricular contraction) 08/27/2019  . Rosacea 09/28/2008  . Unsteadiness 06/04/2019  . WEIGHT LOSS 09/01/2009    Patient Active Problem List   Diagnosis Date Noted  . Acute renal failure superimposed on stage 4 chronic kidney disease, unspecified acute renal failure type ([place]) 03/14/2020  . Acute pulmonary edema (HCC)   . (HFpEF) heart failure with preserved ejection fraction ([place]) 02/10/2020  . Acute on chronic respiratory failure with hypoxia ([place]) 02/10/2020  . Hypoxia 02/10/2020  . CHF (congestive heart failure) ([place]) 02/10/2020  . Aortic stenosis 08/27/2019  . PVC (premature ventricular contraction) 08/27/2019  . Bradycardia 06/04/2019  . Unsteadiness 06/04/2019  . Duodenal ulcer with hemorrhage and obstruction   . Erosive esophagitis   . GI bleed 07/11/2017  . Acute blood loss anemia 07/11/2017  . Nausea and vomiting 07/07/2017  . Hyponatremia 07/05/2017  . Hypothyroidism 07/05/2017  . Mechanical complication due to implant and internal device 12/18/2016  . Pseudophakia of both eyes 04/22/2015  . Exudative age-related macular degeneration of left eye with active choroidal neovascularization ([place]) 01/12/2015  . Chronic cystitis 02/04/2014  . Primary open angle glaucoma of left eye, severe stage 05/19/2013  . Recurrent UTI 04/10/2013  . Renal failure (ARF), acute on chronic (HCC) 12/25/2011  . Chronic kidney disease 09/12/2011  . Retinal or subretinal neovascularization 09/01/2011  .  Central retinal vein occlusion 03/03/2011  . Macular degeneration 12/27/2010  . Cystoid macular edema of left eye 11/22/2010  . Absolute glaucoma of right eye 11/21/2010  . CAROTID ARTERY DISEASE 06/01/2009  . HEMORRHOID, THROMBOSED  02/15/2009  . ROSACEA 09/28/2008  . DIVERTICULOSIS OF COLON 02/01/2007  . HYPERCHOLESTEROLEMIA 07/25/2006  . GOUT 07/25/2006  . DEPRESSION 07/25/2006  . Essential hypertension 07/25/2006  . PROSTATE CANCER, HX OF 07/25/2006    Past Surgical History:  Procedure Laterality Date  . BIOPSY  07/11/2017   Procedure: BIOPSY;  Surgeon: Gatha Mayer, MD;  Location: [facility];  Service: Endoscopy;;  . BRONCHIAL BRUSHINGS  07/11/2017   Procedure: ESOPHAGEAL BRUSHINGS;  Surgeon: Gatha Mayer, MD;  Location: [facility];  Service: Endoscopy;;  . CAROTID ENDARTERECTOMY    . CATARACT EXTRACTION    . ESOPHAGOGASTRODUODENOSCOPY (EGD) WITH PROPOFOL N/A 07/11/2017   Procedure: ESOPHAGOGASTRODUODENOSCOPY (EGD) WITH PROPOFOL;  Surgeon: Gatha Mayer, MD;  Location: [facility];  Service: Endoscopy;  Laterality: N/A;  . RIGHT/LEFT HEART CATH AND CORONARY ANGIOGRAPHY N/A 02/13/2020   Procedure: RIGHT/LEFT HEART CATH AND CORONARY ANGIOGRAPHY;  Surgeon: Sherren Mocha, MD;  Location: [facility];  Service: Cardiovascular;  Laterality: N/A;       Family History  Problem Relation Age of Onset  . Hypertension Father   . Heart attack Father   . Leukemia Sister     Social History   Tobacco Use  . Smoking status: Former Smoker    Types: Cigarettes    Quit date: 01/17/1988    Years since quitting: 32.1  . Smokeless tobacco: Never Used  Vaping Use  . Vaping Use: Never used  Substance Use Topics  . Alcohol use: Yes    Alcohol/week: 7.0 standard drinks    Types: 7 Standard drinks or equivalent per week    Comment: beer  . Drug use: No    Home Medications Prior to Admission medications   Medication Sig Start Date End Date Taking? Authorizing Provider  acetaminophen (TYLENOL) 325 MG tablet Take 2 tablets (650 mg total) by mouth every 6 (six) hours as needed for mild pain (or Fever >/= 101). Patient taking differently: Take 650 mg by mouth every 6 (six) hours as needed for mild pain.  07/13/17   Debbe Odea, MD  apraclonidine (IOPIDINE) 0.5 % ophthalmic solution Place 1 drop into the left eye in the morning and at bedtime.  12/03/17   [provider]  Ascorbic Acid (VITAMIN C) 1000 MG tablet Take 1,000 mg by mouth daily.    [provider]  aspirin EC 81 MG tablet Take 1 tablet (81 mg total) by mouth daily. 01/22/18   Isaac Bliss, Rayford Halsted, MD  atorvastatin (LIPITOR) 10 MG tablet TAKE 1 TABLET(10 MG) BY MOUTH DAILY Patient taking differently: Take 10 mg by mouth daily. TAKE 1 TABLET(10 MG) BY MOUTH DAILY 01/30/20   Isaac Bliss, Rayford Halsted, MD  brinzolamide (AZOPT) 1 % ophthalmic suspension 1 drop into affected eye    [provider]  cholecalciferol (VITAMIN D3) 25 MCG (1000 UNIT) tablet Take 1,000 Units by mouth daily.    [provider]  fish oil-omega-3 fatty acids 1000 MG capsule Take 1 g by mouth daily.    [provider]  furosemide (LASIX) 40 MG tablet Take 1 tablet (40 mg total) by mouth every Monday, Wednesday, and Friday. 02/13/20   Nita Sells, MD  hydrALAZINE (APRESOLINE) 50 MG tablet Take 1 tablet (50 mg total) by mouth 2 (  two) times daily. 02/13/20   Nita Sells, MD  labetalol (NORMODYNE) 100 MG tablet TAKE 1 TABLET(100 MG) BY MOUTH TWICE DAILY Patient taking differently: Take 100 mg by mouth 2 (two) times daily. 01/30/20   Burnell Blanks, MD  latanoprost (XALATAN) 0.005 % ophthalmic solution Place 1 drop into the left eye at bedtime. 04/28/19   [provider]  levocetirizine (XYZAL) 5 MG tablet Take 1 tablet (5 mg total) by mouth every evening. 05/11/16   Marletta Lor, MD  levothyroxine (SYNTHROID) 50 MCG tablet TAKE 1 TABLET BY MOUTH DAILY Patient taking differently: Take 50 mcg by mouth daily before breakfast. TAKE 1 TABLET BY MOUTH DAILY 12/17/19   Isaac Bliss, Rayford Halsted, MD  Magnesium 250 MG TABS Take 250 mg by mouth daily.    [provider]  Melatonin 10 MG  CAPS Take 10 mg by mouth at bedtime.    [provider]  Multiple Vitamin (MULTIVITAMIN WITH MINERALS) TABS tablet Take 1 tablet by mouth daily. 07/13/17   Debbe Odea, MD  Rhodiola 300 MG CAPS Take 300 mg by mouth daily.    [provider]  zinc gluconate 50 MG tablet Take 50 mg by mouth daily.    [provider]    Allergies    Shellfish allergy, Brimonidine, Brinzolamide-brimonidine, Dorzolamide hcl-timolol mal, and Timolol  Review of Systems   Review of Systems  Unable to perform ROS: Dementia  Constitutional: Positive for appetite change and fatigue.  Gastrointestinal: Positive for nausea.    Physical Exam Updated Vital Signs BP (!) 127/40   Pulse (!) 53   Temp 97.7 F (36.5 C) (Oral)   Resp 19   SpO2 95%   Physical Exam Vitals and nursing note reviewed.  HENT:     Head: Normocephalic and atraumatic.     Nose: Nose normal.     Mouth/Throat:     Mouth: Mucous membranes are moist.     Palate: No mass.     Pharynx: Oropharynx is clear. Uvula midline. No oropharyngeal exudate or posterior oropharyngeal erythema.     Tonsils: No tonsillar exudate.  Eyes:     General: Lids are normal.        Right eye: No discharge.        Left eye: No discharge.     Extraocular Movements: Extraocular movements intact.     Conjunctiva/sclera: Conjunctivae normal.     Pupils: Pupils are equal, round, and reactive to light.  Neck:     Trachea: Trachea and phonation normal.  Cardiovascular:     Rate and Rhythm: Normal rate and regular rhythm.     Pulses: Normal pulses.          Radial pulses are 2+ on the right side and 2+ on the left side.       Dorsalis pedis pulses are 2+ on the right side and 2+ on the left side.     Heart sounds: Murmur heard.   Systolic murmur is present with a grade of 4/6.   Pulmonary:     Effort: Pulmonary effort is normal. No accessory muscle usage, prolonged expiration or respiratory distress.     Breath sounds: Examination of  the right-lower field reveals decreased breath sounds. Examination of the left-lower field reveals decreased breath sounds. Decreased breath sounds present. No wheezing or rales.  Chest:     Chest wall: No deformity, swelling, tenderness, crepitus or edema.  Abdominal:     General: Bowel sounds are normal. There  is no distension.     Palpations: Abdomen is soft.     Tenderness: There is no abdominal tenderness. There is no guarding or rebound.  Musculoskeletal:        General: No deformity.     Cervical back: Full passive range of motion without pain, normal range of motion and neck supple. No rigidity or crepitus. No pain with movement, spinous process tenderness or muscular tenderness.     Right lower leg: No edema.     Left lower leg: No edema.  Lymphadenopathy:     Cervical: No cervical adenopathy.  Skin:    General: Skin is warm and dry.     Capillary Refill: Capillary refill takes less than 2 seconds.  Neurological:     Mental Status: He is alert and oriented to person, place, and time. Mental status is at baseline.     Cranial Nerves: Cranial nerves are intact.     Sensory: Sensation is intact.     Motor: Motor function is intact.  Psychiatric:        Mood and Affect: Mood normal.     ED Results / Procedures / Treatments   Labs (all labs ordered are listed, but only abnormal results are displayed) Labs Reviewed  CBC - Abnormal; Notable for the following components:      Result Value   WBC 21.1 (*)    RBC 3.98 (*)    Hemoglobin 12.2 (*)    HCT 36.8 (*)    All other components within normal limits  BRAIN NATRIURETIC PEPTIDE - Abnormal; Notable for the following components:   B Natriuretic Peptide 748.1 (*)    All other components within normal limits  COMPREHENSIVE METABOLIC PANEL - Abnormal; Notable for the following components:   Sodium 130 (*)    Chloride 93 (*)    CO2 18 (*)    Glucose, Bld 117 (*)    BUN 101 (*)    Creatinine, Ser 4.43 (*)    Albumin 3.1 (*)     AST 184 (*)    ALT 210 (*)    Alkaline Phosphatase 309 (*)    Total Bilirubin 1.4 (*)    GFR, Estimated 12 (*)    Anion gap 19 (*)    All other components within normal limits  D-DIMER, QUANTITATIVE (NOT AT [facility]) - Abnormal; Notable for the following components:   D-Dimer, Quant 2.44 (*)    All other components within normal limits  TROPONIN I (HIGH SENSITIVITY) - Abnormal; Notable for the following components:   Troponin I (High Sensitivity) 60 (*)    All other components within normal limits  SARS CORONAVIRUS 2 BY RT PCR (HOSPITAL ORDER, [facility])  CULTURE, BLOOD (SINGLE)  URINE CULTURE  MRSA PCR SCREENING  LACTIC ACID, PLASMA  PROTIME-INR  APTT  LACTIC ACID, PLASMA  URINALYSIS, ROUTINE W REFLEX MICROSCOPIC  I-STAT VENOUS BLOOD GAS, ED    EKG EKG Interpretation  Date/Time:  Thursday February 19 2020 15:31:47 EST Ventricular Rate:  58 PR Interval:    QRS Duration: 126 QT Interval:  458 QTC Calculation: 450 R Axis:   12 Text Interpretation: Sinus rhythm Multiform ventricular premature complexes Left atrial enlargement Nonspecific intraventricular conduction delay Nonspecific T abnrm, anterolateral leads downsloping st segments in the inferior and lateral leads, seen on prior though more pronounced Otherwise no significant change Confirmed by Deno Etienne [phone]) on 03/04/2020 3:35:42 PM   Radiology CT Abdomen Pelvis Wo Contrast  Result Date: 03/09/2020  CLINICAL DATA:  Nausea, vomiting and elevated white blood cell count. EXAM: CT ABDOMEN AND PELVIS WITHOUT CONTRAST TECHNIQUE: Multidetector CT imaging of the abdomen and pelvis was performed following the standard protocol without IV contrast. COMPARISON:  Prior study from 2015. FINDINGS: Lower chest: Dense left lower lobe airspace consolidation consistent with lobar pneumonia. A moderate-sized adjacent parapneumonic effusion is noted. There is also a small right pleural effusion with overlying  atelectasis. The heart is within normal limits in size for age. No pericardial effusion. Aortic and coronary artery calcifications are noted. Hepatobiliary: No hepatic lesions or intrahepatic biliary dilatation. The gallbladder is unremarkable. No common bile duct dilatation. Pancreas: No mass, inflammation or ductal dilatation. Spleen: Normal size.  No focal lesions. Adrenals/Urinary Tract: The adrenal glands are unremarkable. Bilateral renal cysts are noted. No worrisome renal lesions or hydronephrosis. Renal artery calcifications but no renal calculi. The bladder is unremarkable. Stomach/Bowel: The stomach, duodenum, small bowel and colon are grossly normal without oral contrast. No acute inflammatory changes, mass lesions or obstructive findings. The terminal ileum and appendix are normal. There is significant sigmoid colon diverticulosis but no findings suspicious for acute diverticulitis. Vascular/Lymphatic: Advanced atherosclerotic calcifications involving the aorta and iliac arteries and branch vessels. No focal aneurysm. No mesenteric or retroperitoneal mass or adenopathy. Small scattered lymph nodes are noted. Reproductive: Enlarged prostate gland. The seminal vesicles are unremarkable. Other: No pelvic mass or adenopathy. No free pelvic fluid collections. No inguinal mass or adenopathy. No abdominal wall hernia or subcutaneous lesions. Musculoskeletal: No significant bony findings. Age related degenerative changes involving the spine and hips. IMPRESSION: 1. Dense left lower lobe airspace consolidation consistent with lobar pneumonia. Associated moderate-sized adjacent parapneumonic effusion. 2. Small right pleural effusion with overlying atelectasis. 3. No acute abdominal/pelvic findings, mass lesions or adenopathy. 4. Advanced atherosclerotic calcifications involving the aorta and iliac arteries and branch vessels. 5. Advanced sigmoid colon diverticulosis but no findings for acute diverticulitis. 6.  Simple appearing renal cysts. 7. Enlarged prostate gland. Aortic Atherosclerosis (ICD10-I70.0). Electronically Signed   By: Marijo Sanes M.D.   On: 02/21/2020 16:47   DG Chest 2 View  Result Date: 02/17/2020 CLINICAL DATA:  Dyspnea EXAM: CHEST - 2 VIEW COMPARISON:  02/10/2020 FINDINGS: The lungs are symmetrically well expanded. Interstitial pulmonary edema has significantly improved and is now mild in severity. More focal atelectasis or infiltrate is seen within the left lower lobe. Small bilateral pleural effusions have decreased in size. No pneumothorax. Cardiac size within normal limits. Changes of ankylosing spondylitis are again identified with find a syndesmophytes throughout the visualized thoracolumbar spine. There is, however, intervertebral disc space widening incidentally noted at L1-2, not well profiled on this examination, but stable since prior exam. IMPRESSION: Improving pulmonary edema, now mild in severity with decreasing small bilateral pleural effusions. More focal pulmonary infiltrate within the left lower lobe. A superimposed focal infectious process or aspiration is difficult to exclude. Osseous changes in keeping with ankylosing spondylitis. Stable disc space widening at L1-2. Correlation for point tenderness would be helpful, however, as this may reflect changes of a Chance fracture. This is not well assessed on this examination and, if indicated, dedicated radiographs of the lumbar spine may be helpful for further evaluation. Electronically Signed   By: Fidela Salisbury MD   On: 02/17/2020 12:48    Procedures .Critical Care Performed by: Emeline Darling, PA-C Authorized by: Emeline Darling, PA-C   Critical care provider statement:    Critical care time (minutes):  45   Critical  care was necessary to treat or prevent imminent or life-threatening deterioration of the following conditions:  Renal failure (elevated troponin)   Critical care was time spent personally by  me on the following activities:  Discussions with consultants, evaluation of patient's response to treatment, examination of patient, ordering and performing treatments and interventions, ordering and review of laboratory studies, ordering and review of radiographic studies, pulse oximetry, re-evaluation of patient's condition, obtaining history from patient or surrogate and review of old charts     Medications Ordered in ED Medications  vancomycin (VANCOREADY) IVPB 1250 mg/250 mL (1,250 mg Intravenous New Bag/Given 02/23/2020 1629)  vancomycin variable dose per unstable renal function (pharmacist dosing) (has no administration in time range)  sodium chloride 0.9 % bolus 500 mL (has no administration in time range)  ceFEPIme (MAXIPIME) 2 g in sodium chloride 0.9 % 100 mL IVPB (0 g Intravenous Stopped 03/03/2020 1610)    ED Course  I have reviewed the triage vital signs and the nursing notes.  Pertinent labs & imaging results that were available during my care of the patient were reviewed by me and considered in my medical decision making (see chart for details).    MDM Rules/Calculators/A&P                          87 year old male who presents the prompting his cardiologist for elevated white count and AKI, approximately 1 week after discharge from the hospital.  Patient normotensive and afebrile on intake.  He is not hypoxic at this time.  Heart rate is in the 50s, which appears normal for him.  Cardiac exam revealed systolic murmur (upcoming TAVR), pulmonary exam revealed decreased breath sounds in the lung bases bilaterally.  Abdominal exam is benign.  Patient appears neurologically intact.  Has baseline blindness bilaterally.  Will proceed with sepsis work-up.  Patient appears to be a vasculopath, IV access was difficult to obtain and therefore there was a delay in receipt of this patient's laboratory studies.  CBC revealed leukocytosis of 21.1, Mild anemia with Hbg increased from patient's  baseline.  CMP revealed hyponatremia to 130, AKI with BUN/creatinine elevated to 101/4.43, significant increase from patient's baseline of 2.2. Additionally patient has new transaminitis with AST/ALT elevated to 184/210.  Additionally has an anion gap of 19, however lactic acid is normal at 1.7. Will order VBG.  Troponin is elevated to 60, increase patient baseline.  EKG with more pronounced downsloping ST segments in the inferior lateral leads, seen on prior but, more significant at this time.  Otherwise without acute change. Chest x-ray with improving pulmonary edema decreasing small bilateral pleural effusions.  Increased pulmonary infiltrate in the left lower lobe suggesting focal infectious process versus aspiration. Will proceed with HCAP coverage antibiotics, given immunocompromised status with untreated prostate cancer. Additionally, patient's daughter is now bedside who provided collateral information the patient has not had any bowel movement in his father passing any gas since his discharge from the hospital 5 days ago.  Given new transaminitis, will proceed with CT AP wo contrast given AKI.   Given multiple acute issues, I feel this patient would benefit from admission to the hospital at this time.  Consult placed to hospitalist, Dr. Olevia Bowens, who is agreeable to seeing this patient in the emergency department and admitting him to his service.  Appreciate his collaboration in the care of this patient.  This chart was dictated using voice recognition software, Dragon. Despite the best efforts of this  provider to proofread and correct errors, errors may still occur which can change documentation meaning.  Final Clinical Impression(s) / ED Diagnoses Final diagnoses:  None    Rx / DC Orders ED Discharge Orders    None       Emeline Darling, PA-C 03/03/2020 [place], [place], DO 02/20/20 [phone]

## 2020-02-19 NOTE — ED Notes (Signed)
Several attempts made by this rn for venous blood gas collection, mini lab to try next. Provider to be notified.

## 2020-02-19 NOTE — Telephone Encounter (Signed)
Agree. Pt likely has a new infectious process and will need to go the ED for further evaluation and likely admission by the IM service. Cardiology will be glad to consult. I have seen him for TAVR evaluation. He is a marginal candidate for TAVR given age, renal failure, overall poor functional status. Lauree Chandler

## 2020-02-19 NOTE — Telephone Encounter (Signed)
Pt was evaluated as an outpatient yesterday by Dr Angelena Form for potential TAVR. The pt was recently discharged 02/13/2020 and per DC summary the pt needed repeat labs as an outpatient.  These labs were drawn at this evaluation.  Labs show WBC 23.8 and this was normal during admission.  Creatinine (3.56) and BUN (83) are also higher than at time of DC. I spoke with the pt's daughter, [name], and advised her of abnormal results.  The pt has been more fatigued which she felt was related to recent hospitalization and aortic stenosis.  She also states the pt has a "rumbling" congestion in his chest.  Due to potential infection and AKI the pt should be evaluated in ER.  [name] aware of plan and agreed with plan.

## 2020-02-19 NOTE — H&P (Signed)
History and Physical    Alexander Mcdonald XHB:[id number] DOB: 06-10-1926 DOA: 03/04/2020  PCP: Isaac Bliss, Rayford Halsted, MD  Patient coming from: Home.  I have personally briefly reviewed patient's old medical records in [place]  Chief Complaint: Abnormal labs.  HPI: Alexander Mcdonald is a 87 y.o. male with medical history significant of history of HTN, bladder spasm, BPH, bradycardia, history of PVCs, carotid artery disease, gout, glaucoma, thrombosed hemorrhoid, hyperlipidemia, hypertension, prostate cancer, rosacea, unsteadiness who was brought to the emergency department due to abnormal lab results.  He was recently admitted and discharged due to volume overload.  He was discharged on increased furosemide dose.  Please see Dr. Arlyss Queen discharge summary for further detail.  The patient is oriented to name and place, but disoriented to time, date and situation.  I spoke to his daughter [name] who mentioned that the patient did not improve much at home post discharge.  She mentions that his appetite and overall oral intake has been decreased.  He has been urinating less than usual.  He has had increased weakness and fatigue.  No fever or chills to her knowledge.  He was able to answer some simple questions and denies headache, abdominal, back or chest pain.  ED Course: Initial vital signs were temperature 97.7 F, pulse 57, respirations 16, BP 114/48 mmHg O2 sat 97% on room air.  The patient received a 500 mL NS bolus vancomycin and cefepime per pharmacy.  Labwork: CBC showed a white count 21.1, hemoglobin 12.2 g/dL platelets 282.  PT/INR/PTT within normal limits.  D-dimer was 2.44.  Lactic acid was 1.7 and then 1.0 mmol/L.  CMP showed sodium 130, potassium 4.0, chloride 93 and CO2 18 mmol/L with an anion gap of 19. I-STAT venous gas showed a pH of 7.30, PCO2 37 point a.m. PO2 of 73.0 mmHg.  Bicarbonate was 18.5, PCO2 20 and acid base deficit is 7.0 mmol/L.  BNP was 748.1 pg/mL.  Troponin was  60 ng/L.  Imaging: A 2 view chest radiograph showed improving pulmonary edema, and more focal pulmonary infiltrate within the left lower lobe.  The superimposed focal infectious process of aspiration is difficult to exclude.  CT abdomen/pelvis without contrast show a dense left lower lobe airspace consolidation consistent with lobar pneumonia.  Associated moderate sized adjacent parapneumonic effusion.  Small right pleural effusion with overlying atelectasis.  There were no acute abdominal/pelvic findings, mass lesions or adenopathy.  There was advanced atherosclerotic calcifications involving the aorta, iliac vessels and branch vessels.  Diverticulosis without acute diverticulitis.  Simple appearing renal cyst.  There is enlarged prostate gland.  Please see images and full radiology report for further detail.  Review of Systems: As per HPI otherwise all other systems reviewed and are negative.  Past Medical History:  Diagnosis Date  . ACUTE RENAL FAILURE W/LESION OF TUBULAR NECROSIS 01/04/2007  . Aortic stenosis 08/27/2019  . Bladder spasm 06/04/2014  . BPH (benign prostatic hyperplasia) 06/04/2014  . Bradycardia 06/04/2019  . CAROTID ARTERY DISEASE 06/01/2009  . DEPRESSION 07/25/2006  . DIVERTICULOSIS OF COLON 02/01/2007  . Glaucoma   . GOUT 07/25/2006  . HEMORRHOID, THROMBOSED 02/15/2009  . HYPERCHOLESTEROLEMIA 07/25/2006  . HYPERTENSION 07/25/2006  . PROSTATE CANCER, HX OF 07/25/2006  . PVC (premature ventricular contraction) 08/27/2019  . Rosacea 09/28/2008  . Unsteadiness 06/04/2019  . WEIGHT LOSS 09/01/2009    Past Surgical History:  Procedure Laterality Date  . BIOPSY  07/11/2017   Procedure: BIOPSY;  Surgeon: Gatha Mayer, MD;  Location: [facility];  Service: Endoscopy;;  . BRONCHIAL BRUSHINGS  07/11/2017   Procedure: ESOPHAGEAL BRUSHINGS;  Surgeon: Gatha Mayer, MD;  Location: [facility];  Service: Endoscopy;;  . CAROTID ENDARTERECTOMY    . CATARACT EXTRACTION    .  ESOPHAGOGASTRODUODENOSCOPY (EGD) WITH PROPOFOL N/A 07/11/2017   Procedure: ESOPHAGOGASTRODUODENOSCOPY (EGD) WITH PROPOFOL;  Surgeon: Gatha Mayer, MD;  Location: [facility];  Service: Endoscopy;  Laterality: N/A;  . RIGHT/LEFT HEART CATH AND CORONARY ANGIOGRAPHY N/A 02/13/2020   Procedure: RIGHT/LEFT HEART CATH AND CORONARY ANGIOGRAPHY;  Surgeon: Sherren Mocha, MD;  Location: [facility];  Service: Cardiovascular;  Laterality: N/A;    Social History  reports that he quit smoking about 32 years ago. His smoking use included cigarettes. He has never used smokeless tobacco. He reports current alcohol use of about 7.0 standard drinks of alcohol per week. He reports that he does not use drugs.  Allergies  Allergen Reactions  . Shellfish Allergy Anaphylaxis    Pt is allergic to mussels   . Brimonidine Itching    Itching and swelling red  . Brinzolamide-Brimonidine Itching  . Dorzolamide Hcl-Timolol Mal Other (See Comments)    Eye itch and redness  . Timolol Itching    Itching rash under eyes.    Family History  Problem Relation Age of Onset  . Hypertension Father   . Heart attack Father   . Leukemia Sister    Prior to Admission medications   Medication Sig Start Date End Date Taking? Authorizing Provider  apraclonidine (IOPIDINE) 0.5 % ophthalmic solution Place 1 drop into the left eye in the morning and at bedtime.  12/03/17  Yes [provider]  Ascorbic Acid (VITAMIN C) 1000 MG tablet Take 1,000 mg by mouth daily.   Yes [provider]  aspirin EC 81 MG tablet Take 1 tablet (81 mg total) by mouth daily. 01/22/18  Yes Isaac Bliss, Rayford Halsted, MD  atorvastatin (LIPITOR) 10 MG tablet TAKE 1 TABLET(10 MG) BY MOUTH DAILY Patient taking differently: Take 10 mg by mouth daily. TAKE 1 TABLET(10 MG) BY MOUTH DAILY 01/30/20  Yes Isaac Bliss, Rayford Halsted, MD  cholecalciferol (VITAMIN D3) 25 MCG (1000 UNIT) tablet Take 1,000 Units by mouth daily.   Yes [provider]  fish oil-omega-3 fatty acids 1000 MG capsule Take 1 g by mouth daily.   Yes [provider]  furosemide (LASIX) 40 MG tablet Take 1 tablet (40 mg total) by mouth every Monday, Wednesday, and Friday. 02/13/20  Yes Nita Sells, MD  hydrALAZINE (APRESOLINE) 50 MG tablet Take 1 tablet (50 mg total) by mouth 2 (two) times daily. 02/13/20  Yes Nita Sells, MD  labetalol (NORMODYNE) 100 MG tablet TAKE 1 TABLET(100 MG) BY MOUTH TWICE DAILY Patient taking differently: Take 100 mg by mouth 2 (two) times daily. 01/30/20  Yes Burnell Blanks, MD  latanoprost (XALATAN) 0.005 % ophthalmic solution Place 1 drop into the left eye at bedtime. 04/28/19  Yes [provider]  levocetirizine (XYZAL) 5 MG tablet Take 1 tablet (5 mg total) by mouth every evening. 05/11/16  Yes Marletta Lor, MD  levothyroxine (SYNTHROID) 50 MCG tablet TAKE 1 TABLET BY MOUTH DAILY Patient taking differently: Take 50 mcg by mouth daily before breakfast. TAKE 1 TABLET BY MOUTH DAILY 12/17/19  Yes Isaac Bliss, Rayford Halsted, MD  Magnesium 250 MG TABS Take 250 mg by mouth daily.   Yes [provider]  Melatonin 10 MG CAPS Take 10 mg  by mouth at bedtime.   Yes [provider]  Rhodiola 300 MG CAPS Take 300 mg by mouth daily.   Yes [provider]  zinc gluconate 50 MG tablet Take 50 mg by mouth daily.   Yes [provider]  acetaminophen (TYLENOL) 325 MG tablet Take 2 tablets (650 mg total) by mouth every 6 (six) hours as needed for mild pain (or Fever >/= 101). Patient taking differently: Take 650 mg by mouth every 6 (six) hours as needed for mild pain. 07/13/17   Debbe Odea, MD  Multiple Vitamin (MULTIVITAMIN WITH MINERALS) TABS tablet Take 1 tablet by mouth daily. Patient not taking: Reported on 03/04/2020 07/13/17   Debbe Odea, MD   Physical Exam: Vitals:   03/01/2020 1415 02/27/2020 1430 02/27/2020 1445 02/20/2020 1500  BP: (!) 107/37 (!) 113/54  (!) 115/44 (!) 127/40  Pulse: (!) 57 (!) 56 (!) 58 (!) 53  Resp: 15 13 19 19   Temp:      TempSrc:      SpO2: 97% 97% 97% 95%   Constitutional: Looks chronically ill.  Currently in NAD. Eyes: PERRL, lids and conjunctivae mildly injected. ENMT: Mucous membranes are dry. Posterior pharynx clear of any exudate or lesions. Neck: normal, supple, no masses, no thyromegaly Respiratory: clear to auscultation bilaterally, no wheezing, no crackles. Normal respiratory effort. No accessory muscle use.  Cardiovascular: Bradycardic in the high 50s with occasional extrasystoles, positive 2/6 systolic murmur, no rubs / gallops. No extremity edema. 2+ pedal pulses. No carotid bruits.  Abdomen: No distention.  Bowel sounds positive.  Soft, no tenderness, no masses palpated. No hepatosplenomegaly.  Musculoskeletal: Moderate generalized weakness.  No clubbing / cyanosis.  Good ROM, no contractures. Normal muscle tone.  Skin: Decreased skin turgor with multiple areas of ecchymosis. Neurologic: CN 2-12 grossly intact. Sensation intact, DTR normal.  Generalized nonfocal weakness. Psychiatric: Alert and oriented x 2, disoriented to time, date and situation.  Labs on Admission: I have personally reviewed following labs and imaging studies  CBC: Recent Labs  Lab 02/13/20 0551 02/13/20 1654 02/13/20 1657 02/18/20 1020 03/09/2020 1244  WBC 9.4  --   --  23.8* 21.1*  HGB 12.1* 11.9* 11.9* 11.9* 12.2*  HCT 37.1* 35.0* 35.0* 34.4* 36.8*  MCV 93.7  --   --  88 92.5  PLT 230  --   --  277 242    Basic Metabolic Panel: Recent Labs  Lab 02/13/20 0551 02/13/20 1654 02/13/20 1657 02/18/20 1020 02/28/2020 1426  NA 142 142 143 132* 130*  K 3.5 3.7 3.7 3.8 4.0  CL 106  --   --  93* 93*  CO2 23  --   --  19* 18*  GLUCOSE 104*  --   --  140* 117*  BUN 52*  --   --  83* 101*  CREATININE 2.09*  --   --  3.56* 4.43*  CALCIUM 8.9  --   --  8.9 9.3    GFR: Estimated Creatinine Clearance: 9.4 mL/min (A) (by C-G  formula based on SCr of 4.43 mg/dL (H)).  Liver Function Tests: Recent Labs  Lab 02/13/20 0551 03/06/2020 1426  AST 25 184*  ALT 37 210*  ALKPHOS 101 309*  BILITOT 1.1 1.4*  PROT 5.9* 6.5  ALBUMIN 3.3* 3.1*   Radiological Exams on Admission: CT Abdomen Pelvis Wo Contrast  Result Date: 02/28/2020 CLINICAL DATA:  Nausea, vomiting and elevated white blood cell count. EXAM: CT ABDOMEN AND PELVIS WITHOUT CONTRAST TECHNIQUE: Multidetector CT  imaging of the abdomen and pelvis was performed following the standard protocol without IV contrast. COMPARISON:  Prior study from 2015. FINDINGS: Lower chest: Dense left lower lobe airspace consolidation consistent with lobar pneumonia. A moderate-sized adjacent parapneumonic effusion is noted. There is also a small right pleural effusion with overlying atelectasis. The heart is within normal limits in size for age. No pericardial effusion. Aortic and coronary artery calcifications are noted. Hepatobiliary: No hepatic lesions or intrahepatic biliary dilatation. The gallbladder is unremarkable. No common bile duct dilatation. Pancreas: No mass, inflammation or ductal dilatation. Spleen: Normal size.  No focal lesions. Adrenals/Urinary Tract: The adrenal glands are unremarkable. Bilateral renal cysts are noted. No worrisome renal lesions or hydronephrosis. Renal artery calcifications but no renal calculi. The bladder is unremarkable. Stomach/Bowel: The stomach, duodenum, small bowel and colon are grossly normal without oral contrast. No acute inflammatory changes, mass lesions or obstructive findings. The terminal ileum and appendix are normal. There is significant sigmoid colon diverticulosis but no findings suspicious for acute diverticulitis. Vascular/Lymphatic: Advanced atherosclerotic calcifications involving the aorta and iliac arteries and branch vessels. No focal aneurysm. No mesenteric or retroperitoneal mass or adenopathy. Small scattered lymph nodes are noted.  Reproductive: Enlarged prostate gland. The seminal vesicles are unremarkable. Other: No pelvic mass or adenopathy. No free pelvic fluid collections. No inguinal mass or adenopathy. No abdominal wall hernia or subcutaneous lesions. Musculoskeletal: No significant bony findings. Age related degenerative changes involving the spine and hips. IMPRESSION: 1. Dense left lower lobe airspace consolidation consistent with lobar pneumonia. Associated moderate-sized adjacent parapneumonic effusion. 2. Small right pleural effusion with overlying atelectasis. 3. No acute abdominal/pelvic findings, mass lesions or adenopathy. 4. Advanced atherosclerotic calcifications involving the aorta and iliac arteries and branch vessels. 5. Advanced sigmoid colon diverticulosis but no findings for acute diverticulitis. 6. Simple appearing renal cysts. 7. Enlarged prostate gland. Aortic Atherosclerosis (ICD10-I70.0). Electronically Signed   By: Marijo Sanes M.D.   On: 02/27/2020 16:47   DG Chest 2 View  Result Date: 03/01/2020 CLINICAL DATA:  Dyspnea EXAM: CHEST - 2 VIEW COMPARISON:  02/10/2020 FINDINGS: The lungs are symmetrically well expanded. Interstitial pulmonary edema has significantly improved and is now mild in severity. More focal atelectasis or infiltrate is seen within the left lower lobe. Small bilateral pleural effusions have decreased in size. No pneumothorax. Cardiac size within normal limits. Changes of ankylosing spondylitis are again identified with find a syndesmophytes throughout the visualized thoracolumbar spine. There is, however, intervertebral disc space widening incidentally noted at L1-2, not well profiled on this examination, but stable since prior exam. IMPRESSION: Improving pulmonary edema, now mild in severity with decreasing small bilateral pleural effusions. More focal pulmonary infiltrate within the left lower lobe. A superimposed focal infectious process or aspiration is difficult to exclude. Osseous  changes in keeping with ankylosing spondylitis. Stable disc space widening at L1-2. Correlation for point tenderness would be helpful, however, as this may reflect changes of a Chance fracture. This is not well assessed on this examination and, if indicated, dedicated radiographs of the lumbar spine may be helpful for further evaluation. Electronically Signed   By: Fidela Salisbury MD   On: 03/14/2020 12:48    EKG: Independently reviewed. Vent. rate 58 BPM PR interval * ms QRS duration 126 ms QT/QTc 458/450 ms P-R-T axes 62 12 41 Sinus rhythm Multiform ventricular premature complexes Left atrial enlargement Nonspecific intraventricular conduction delay Nonspecific T abnrm, anterolateral leads  Assessment/Plan Principal Problem:   Acute renal failure superimposed on stage 4  chronic kidney disease, unspecified  acute renal failure type ([place]) Observation/progressive unit. Hold furosemide. Gentle and time-limited IV hydration. Monitor intake and output. Follow-up renal function electrolytes.  Active Problems:   CAP (community acquired pneumonia) Continue supplemental oxygen. Bronchodilators as needed. Begin ceftriaxone 2 g IVPB every 24 hours. Start azithromycin 500 mg IVPB every 24 hours. Check sputum Gram stain, culture and sensitivity. Check strep pneumonia and Legionella urinary antigens. May need thoracentesis for parapneumonic effusion.    Hyponatremia Hold furosemide. Time-limited IV hydration overnight. Will reevaluate fluid status and a.m.    HYPERCHOLESTEROLEMIA Continue atorvastatin 10 mg p.o. daily.    Aortic stenosis TAVR is pending. Cardiology is following.    Essential hypertension Hold labetalol due to bradycardia. Continue hydralazine 50 mg p.o. daily. Holding diuretic due to AKI. Monitor BP, heart rate, renal function electrolytes.    Hypothyroidism  Continue levothyroxine 50 mcg p.o. daily.    Glaucoma Continue apraclonidine drops. Follow-up with  ophthalmology as scheduled.    DVT prophylaxis: Heparin SQ. Code Status:   Full code. Family Communication:   [place], [place] Daughter [phone]   Disposition Plan:   Patient is from:  Home.  Anticipated DC to:  Home.  Anticipated DC date:  02/21/2020.  Anticipated DC barriers: Clinical status.  Consults called: Admission status:  Observation/progressive.  Severity of Illness:  Very high given his age and multiple comorbidities now presenting to the hospital a week after discharge from volume overload with LLL pneumonia with effusion and AKI.  The patient will need to remain in the hospital for IV antibiotic therapy and gentle IV rehydration.  Reubin Milan MD [facility]  How to contact the [facility] Attending or Consulting provider [place] or covering provider during after hours [place], for this patient?   1. Check the care team in [facility] and look for a) attending/consulting [facility] provider listed and b) the [facility] team listed 2. Log into [email or web address] and use [place]'s universal password to access. If you do not have the password, please contact the hospital operator. 3. Locate the [facility] provider you are looking for under [facility] and page to a number that you can be directly reached. 4. If you still have difficulty reaching the provider, please page the [facility] (Director on Call) for the Hospitalists listed on amion for assistance.  02/27/2020, 5:04 PM   This document was prepared using Dragon voice recognition software and may contain some unintended transcription errors.

## 2020-02-19 NOTE — ED Notes (Signed)
md at bedside

## 2020-02-19 NOTE — ED Triage Notes (Signed)
Pt arrives from home via [facility] after being advised to come to ER due to labs he had done yesterday that showed, "Labs show WBC 23.8 and this was normal during admission.  Creatinine (3.56) and BUN (83)."   He was being evaluated by cardiology for possible "TAVR".

## 2020-02-19 NOTE — Progress Notes (Signed)
Pharmacy Antibiotic Note  Alexander Mcdonald is a 87 y.o. male admitted on 03/03/2020 with pneumonia.  Pharmacy has been consulted for vancomycin dosing.  Patient also initiated on Cefepime - pharmacy not consulted to dose.   Plan: Vancomycin 1250mg  IV x1  Cefepime 2g IV x1 Will not order vancomycin maintenance doses at this time due to unstable renal function (presenting with AKI - Scr on admit 3.56, BL appears around 2) F/u MRSA PCR F/u clinical improvement, culture data, narrow abx as indicated    Temp (24hrs), Avg:97.7 F (36.5 C), Min:97.7 F (36.5 C), Max:97.7 F (36.5 C)  Recent Labs  Lab 02/13/20 0551 02/18/20 1020 02/27/2020 1244 03/06/2020 1356  WBC 9.4 23.8* 21.1*  --   CREATININE 2.09* 3.56*  --   --   LATICACIDVEN  --   --   --  1.7    Estimated Creatinine Clearance: 11.8 mL/min (A) (by C-G formula based on SCr of 3.56 mg/dL (H)).    Allergies  Allergen Reactions  . Shellfish Allergy Anaphylaxis    Pt is allergic to mussels   . Brimonidine Itching    Itching and swelling red  . Brinzolamide-Brimonidine Itching  . Dorzolamide Hcl-Timolol Mal Other (See Comments)    Eye itch and redness  . Timolol Itching    Itching rash under eyes.    Antimicrobials this admission: Cefepime 2/3 >>  Vancomycin 2/3 >>    Microbiology results: 2/3 BCx: sent 2/3 UCx: needs to be collected  2/3 MRSA PCR: ordered  Thank you for allowing pharmacy to be a part of this patient's care.  Dimple Nanas, PharmD PGY-1 Acute Care Pharmacy Resident Office: [phone] 03/13/2020 3:07 PM

## 2020-02-20 DIAGNOSIS — J90 Pleural effusion, not elsewhere classified: Secondary | ICD-10-CM | POA: Diagnosis not present

## 2020-02-20 DIAGNOSIS — J1282 Pneumonia due to coronavirus disease 2019: Secondary | ICD-10-CM | POA: Diagnosis not present

## 2020-02-20 DIAGNOSIS — N184 Chronic kidney disease, stage 4 (severe): Secondary | ICD-10-CM | POA: Diagnosis present

## 2020-02-20 DIAGNOSIS — I5033 Acute on chronic diastolic (congestive) heart failure: Secondary | ICD-10-CM | POA: Diagnosis not present

## 2020-02-20 DIAGNOSIS — I350 Nonrheumatic aortic (valve) stenosis: Secondary | ICD-10-CM | POA: Diagnosis present

## 2020-02-20 DIAGNOSIS — I10 Essential (primary) hypertension: Secondary | ICD-10-CM | POA: Diagnosis not present

## 2020-02-20 DIAGNOSIS — J9811 Atelectasis: Secondary | ICD-10-CM | POA: Diagnosis not present

## 2020-02-20 DIAGNOSIS — Z79890 Hormone replacement therapy (postmenopausal): Secondary | ICD-10-CM | POA: Diagnosis not present

## 2020-02-20 DIAGNOSIS — E440 Moderate protein-calorie malnutrition: Secondary | ICD-10-CM | POA: Diagnosis present

## 2020-02-20 DIAGNOSIS — E871 Hypo-osmolality and hyponatremia: Secondary | ICD-10-CM

## 2020-02-20 DIAGNOSIS — R6520 Severe sepsis without septic shock: Secondary | ICD-10-CM | POA: Diagnosis not present

## 2020-02-20 DIAGNOSIS — C61 Malignant neoplasm of prostate: Secondary | ICD-10-CM | POA: Diagnosis present

## 2020-02-20 DIAGNOSIS — J189 Pneumonia, unspecified organism: Secondary | ICD-10-CM | POA: Diagnosis present

## 2020-02-20 DIAGNOSIS — J9601 Acute respiratory failure with hypoxia: Secondary | ICD-10-CM | POA: Diagnosis present

## 2020-02-20 DIAGNOSIS — R918 Other nonspecific abnormal finding of lung field: Secondary | ICD-10-CM | POA: Diagnosis not present

## 2020-02-20 DIAGNOSIS — R091 Pleurisy: Secondary | ICD-10-CM | POA: Diagnosis not present

## 2020-02-20 DIAGNOSIS — E039 Hypothyroidism, unspecified: Secondary | ICD-10-CM

## 2020-02-20 DIAGNOSIS — H409 Unspecified glaucoma: Secondary | ICD-10-CM | POA: Diagnosis present

## 2020-02-20 DIAGNOSIS — J918 Pleural effusion in other conditions classified elsewhere: Secondary | ICD-10-CM | POA: Diagnosis present

## 2020-02-20 DIAGNOSIS — I5032 Chronic diastolic (congestive) heart failure: Secondary | ICD-10-CM | POA: Diagnosis present

## 2020-02-20 DIAGNOSIS — Z79899 Other long term (current) drug therapy: Secondary | ICD-10-CM | POA: Diagnosis not present

## 2020-02-20 DIAGNOSIS — E7800 Pure hypercholesterolemia, unspecified: Secondary | ICD-10-CM | POA: Diagnosis present

## 2020-02-20 DIAGNOSIS — A419 Sepsis, unspecified organism: Secondary | ICD-10-CM | POA: Diagnosis not present

## 2020-02-20 DIAGNOSIS — Z66 Do not resuscitate: Secondary | ICD-10-CM | POA: Diagnosis not present

## 2020-02-20 DIAGNOSIS — R7989 Other specified abnormal findings of blood chemistry: Secondary | ICD-10-CM | POA: Diagnosis not present

## 2020-02-20 DIAGNOSIS — F0390 Unspecified dementia without behavioral disturbance: Secondary | ICD-10-CM | POA: Diagnosis present

## 2020-02-20 DIAGNOSIS — U071 COVID-19: Secondary | ICD-10-CM | POA: Diagnosis not present

## 2020-02-20 DIAGNOSIS — I130 Hypertensive heart and chronic kidney disease with heart failure and stage 1 through stage 4 chronic kidney disease, or unspecified chronic kidney disease: Secondary | ICD-10-CM | POA: Diagnosis present

## 2020-02-20 DIAGNOSIS — N179 Acute kidney failure, unspecified: Secondary | ICD-10-CM | POA: Diagnosis present

## 2020-02-20 DIAGNOSIS — Y95 Nosocomial condition: Secondary | ICD-10-CM | POA: Diagnosis present

## 2020-02-20 DIAGNOSIS — K2210 Ulcer of esophagus without bleeding: Secondary | ICD-10-CM | POA: Diagnosis present

## 2020-02-20 DIAGNOSIS — Z515 Encounter for palliative care: Secondary | ICD-10-CM | POA: Diagnosis not present

## 2020-02-20 LAB — CBC WITH DIFFERENTIAL/PLATELET
Abs Immature Granulocytes: 0.19 10*3/uL — ABNORMAL HIGH (ref 0.00–0.07)
Basophils Absolute: 0 10*3/uL (ref 0.0–0.1)
Basophils Relative: 0 %
Eosinophils Absolute: 0.1 10*3/uL (ref 0.0–0.5)
Eosinophils Relative: 1 %
HCT: 31 % — ABNORMAL LOW (ref 39.0–52.0)
Hemoglobin: 9.9 g/dL — ABNORMAL LOW (ref 13.0–17.0)
Immature Granulocytes: 1 %
Lymphocytes Relative: 5 %
Lymphs Abs: 0.9 10*3/uL (ref 0.7–4.0)
MCH: 29.6 pg (ref 26.0–34.0)
MCHC: 31.9 g/dL (ref 30.0–36.0)
MCV: 92.5 fL (ref 80.0–100.0)
Monocytes Absolute: 1 10*3/uL (ref 0.1–1.0)
Monocytes Relative: 5 %
Neutro Abs: 16.1 10*3/uL — ABNORMAL HIGH (ref 1.7–7.7)
Neutrophils Relative %: 88 %
Platelets: 254 10*3/uL (ref 150–400)
RBC: 3.35 MIL/uL — ABNORMAL LOW (ref 4.22–5.81)
RDW: 14.4 % (ref 11.5–15.5)
WBC: 18.3 10*3/uL — ABNORMAL HIGH (ref 4.0–10.5)
nRBC: 0 % (ref 0.0–0.2)

## 2020-02-20 LAB — COMPREHENSIVE METABOLIC PANEL
ALT: 139 U/L — ABNORMAL HIGH (ref 0–44)
AST: 85 U/L — ABNORMAL HIGH (ref 15–41)
Albumin: 2.5 g/dL — ABNORMAL LOW (ref 3.5–5.0)
Alkaline Phosphatase: 247 U/L — ABNORMAL HIGH (ref 38–126)
Anion gap: 15 (ref 5–15)
BUN: 103 mg/dL — ABNORMAL HIGH (ref 8–23)
CO2: 18 mmol/L — ABNORMAL LOW (ref 22–32)
Calcium: 9 mg/dL (ref 8.9–10.3)
Chloride: 100 mmol/L (ref 98–111)
Creatinine, Ser: 4.35 mg/dL — ABNORMAL HIGH (ref 0.61–1.24)
GFR, Estimated: 12 mL/min — ABNORMAL LOW (ref >60)
Glucose, Bld: 109 mg/dL — ABNORMAL HIGH (ref 70–99)
Potassium: 3.6 mmol/L (ref 3.5–5.1)
Sodium: 133 mmol/L — ABNORMAL LOW (ref 135–145)
Total Bilirubin: 1 mg/dL (ref 0.3–1.2)
Total Protein: 5.2 g/dL — ABNORMAL LOW (ref 6.5–8.1)

## 2020-02-20 LAB — URINALYSIS, ROUTINE W REFLEX MICROSCOPIC
Bilirubin Urine: NEGATIVE
Glucose, UA: NEGATIVE mg/dL
Hgb urine dipstick: NEGATIVE
Ketones, ur: NEGATIVE mg/dL
Nitrite: NEGATIVE
Protein, ur: NEGATIVE mg/dL
Specific Gravity, Urine: 1.013 (ref 1.005–1.030)
pH: 5 (ref 5.0–8.0)

## 2020-02-20 MED ORDER — BISACODYL 10 MG RE SUPP: 10.0000 mg | Freq: Once | RECTAL | Status: DC

## 2020-02-20 MED ORDER — SODIUM CHLORIDE 0.9 % IV SOLN: INTRAVENOUS | Status: AC

## 2020-02-20 MED ORDER — LIDOCAINE HCL URETHRAL/MUCOSAL 2 % EX GEL
1.0000 "application " | Freq: Once | CUTANEOUS | Status: AC
  Administered 2020-02-20: 1 via URETHRAL
  Filled 2020-02-20: qty 11

## 2020-02-20 MED ORDER — POLYETHYLENE GLYCOL 3350 17 G PO PACK
17.0000 g | PACK | Freq: Two times a day (BID) | ORAL | Status: DC
  Administered 2020-02-26 – 2020-02-27 (×3): 17 g via ORAL
  Filled 2020-02-20 (×7): qty 1

## 2020-02-20 MED ORDER — CHLORHEXIDINE GLUCONATE CLOTH 2 % EX PADS
6.0000 | MEDICATED_PAD | Freq: Every day | CUTANEOUS | Status: DC
  Administered 2020-02-20 – 2020-02-23 (×4): 6 via TOPICAL

## 2020-02-20 NOTE — Plan of Care (Signed)
  Problem: Education: Goal: Knowledge of General Education information will improve Description: Including pain rating scale, medication(s)/side effects and non-pharmacologic comfort measures Outcome: Progressing   Problem: Health Behavior/Discharge Planning: Goal: Ability to manage health-related needs will improve Outcome: Progressing   Problem: Clinical Measurements: Goal: Ability to maintain clinical measurements within normal limits will improve Outcome: Progressing Goal: Will remain free from infection Outcome: Progressing   Problem: Activity: Goal: Risk for activity intolerance will decrease Outcome: Progressing   Problem: Elimination: Goal: Will not experience complications related to bowel motility Outcome: Progressing   Problem: Pain Managment: Goal: General experience of comfort will improve Outcome: Progressing   Problem: Safety: Goal: Ability to remain free from injury will improve Outcome: Progressing   Problem: Skin Integrity: Goal: Risk for impaired skin integrity will decrease Outcome: Progressing   Problem: Education: Goal: Knowledge of General Education information will improve Description: Including pain rating scale, medication(s)/side effects and non-pharmacologic comfort measures Outcome: Progressing   Problem: Health Behavior/Discharge Planning: Goal: Ability to manage health-related needs will improve Outcome: Progressing

## 2020-02-20 NOTE — Progress Notes (Signed)
Pt said he was having discomfort and thought he needed to have bowel movement but was unable. RN and NT placed pt on bedpan but he was unable to go. MD notified and said he would order Miralax.  Later in the day pt stated that he was having discomfort and  felt like he needed to urinate but was unable. RN bladder scanned pt and there were 335 mL of urine scanned. Another RN attempted to in-and-out cath the pt but was unable to insert catheter. MD notified and said for pt to have [place] cath. Charge nurse ordered coude cath tray and  talked to team on 4W to have them come perform cath for pt. MD also put in order for Dulcolax suppository for PM shift.  Central Tele called RN and said pt had a 6-beat run of VT followed by a 3-beat run. RN spoke with MD to notify. PT being monitored.

## 2020-02-20 NOTE — ED Notes (Signed)
SDU Breakfast Ordered 

## 2020-02-20 NOTE — ED Notes (Signed)
Bed assignment received. Daughter and bedside.

## 2020-02-20 NOTE — ED Notes (Signed)
Pt on 3 l [place]

## 2020-02-20 NOTE — Progress Notes (Addendum)
PROGRESS NOTE    Alexander Mcdonald  WER:[id number] DOB: 1927/01/05 DOA: 03/03/2020 PCP: Isaac Bliss, Rayford Halsted, MD   Brief Narrative: Alexander Mcdonald is a 87 y.o.  male with medical history significant of history of HTN, bladder spasm, BPH, bradycardia, history of PVCs, carotid artery disease, gout, glaucoma, thrombosed hemorrhoid, hyperlipidemia, hypertension, prostate cancer, rosacea, unsteadiness. Patient presented secondary to an elevated creatinine and found to have an AKI in addition to a LLL pneumonia. Nephrology consulted. IV antibiotics initiated.   Assessment & Plan:   Principal Problem:   Acute renal failure superimposed on stage 4 chronic kidney disease, unspecified acute renal failure type (HCC) Active Problems:   HYPERCHOLESTEROLEMIA   Essential hypertension   CAP (community acquired pneumonia)   Hyponatremia   Hypothyroidism   Aortic stenosis   Glaucoma   AKI on CKD stage IV Baseline creatinine of about 2.2-2.4. Creatinine of 4.43 on admission. Given IV fluids on admission. No hydronephrosis on CT abdomen/pelvis. -Continue IV fluids -Daily CMP  Community acquired pneumonia LLL pneumonia seen on CT imaging.  -Continue Ceftriaxone/Azithromycin -Follow-up strep pneumoniae/legionella ur ag, sputum culture  Acute respiratory failure with hypoxia Likely secondary to pneumonia. SpO2 as low as 87% on room air on admission. Initially placed on 2 lpm of oxygen -Continue oxygen and wean to room air as able -Keep SpO2 > 92%  Bradycardia -Discontinue labetalol -Telemetry  Hyponatremia Mild. Improved with IV fluids.  Hyperlipidemia -Continue Lipitor  Aortic stenosis Patient with severe disease with plan for consideration of TAVR as an outpatient.  Primary hypertension -Continue hydralazine, labetalol  Hypothyroidism -Continue Synthroid  Glaucoma -Continue apraclonidine, latanoprost   DVT prophylaxis: Heparin subq Code Status:   Code Status: Full  Code Family Communication: Called daughter with no response Disposition Plan: Discharge home vs SNF likely in several days pending PT/OT recs, improvement of AKI, transition to oral antibiotics   Consultants:   Nephrology  Procedures:   None  Antimicrobials:  Vancomycin  Ceftriaxone  Azithromycin    Subjective: No concerns today.  Objective: Vitals:   02/20/20 0515 02/20/20 0530 02/20/20 0615 02/20/20 0630  BP: (!) 141/44 (!) 137/44 131/81 (!) 144/41  Pulse:  69 69   Resp: 14 14 14 14   Temp:      TempSrc:      SpO2: 97% 97% 94% 96%    Intake/Output Summary (Last 24 hours) at 02/20/2020 0735 Last data filed at 03/14/2020 2135 Gross per 24 hour  Intake 1200 ml  Output -  Net 1200 ml   There were no vitals filed for this visit.  Examination:  General exam: Appears calm and comfortable  Respiratory system: Diminished. Respiratory effort normal. Cardiovascular system: S1 & S2 heard, RRR. 2/6 systolic murmur. Gastrointestinal system: Abdomen is nondistended, soft and nontender. No organomegaly or masses felt. Normal bowel sounds heard. Central nervous system: Alert and oriented. No focal neurological deficits. Musculoskeletal: No edema. No calf tenderness Skin: No cyanosis. No rashes Psychiatry: Judgement and insight appear normal. Mood & affect appropriate.     Data Reviewed: I have personally reviewed following labs and imaging studies  CBC Lab Results  Component Value Date   WBC 18.3 (H) 02/20/2020   RBC 3.35 (L) 02/20/2020   HGB 9.9 (L) 02/20/2020   HCT 31.0 (L) 02/20/2020   MCV 92.5 02/20/2020   MCH 29.6 02/20/2020   PLT 254 02/20/2020   MCHC 31.9 02/20/2020   RDW 14.4 02/20/2020   LYMPHSABS 0.9 02/20/2020   MONOABS 1.0 02/20/2020  EOSABS 0.1 02/20/2020   BASOSABS 0.0 44/81/8563     Last metabolic panel Lab Results  Component Value Date   NA 133 (L) 02/20/2020   K 3.6 02/20/2020   CL 100 02/20/2020   CO2 18 (L) 02/20/2020   BUN 103 (H)  02/20/2020   CREATININE 4.35 (H) 02/20/2020   GLUCOSE 109 (H) 02/20/2020   GFRNONAA 12 (L) 02/20/2020   GFRAA 16 (L) 02/18/2020   CALCIUM 9.0 02/20/2020   PHOS 3.4 07/11/2017   PROT 5.2 (L) 02/20/2020   ALBUMIN 2.5 (L) 02/20/2020   BILITOT 1.0 02/20/2020   ALKPHOS 247 (H) 02/20/2020   AST 85 (H) 02/20/2020   ALT 139 (H) 02/20/2020   ANIONGAP 15 02/20/2020    CBG (last 3)  No results for input(s): GLUCAP in the last 72 hours.   GFR: Estimated Creatinine Clearance: 9.6 mL/min (A) (by C-G formula based on SCr of 4.35 mg/dL (H)).  Coagulation Profile: Recent Labs  Lab 03/07/2020 1507  INR 1.2    Recent Results (from the past 240 hour(s))  SARS Coronavirus 2 by RT PCR (hospital order, performed in [facility] hospital lab) Nasopharyngeal Nasopharyngeal Swab     Status: None   Collection Time: 03/09/2020  2:41 PM   Specimen: Nasopharyngeal Swab  Result Value Ref Range Status   SARS Coronavirus 2 NEGATIVE NEGATIVE Final    Comment: (NOTE) SARS-CoV-2 target nucleic acids are NOT DETECTED.  The SARS-CoV-2 RNA is generally detectable in upper and lower respiratory specimens during the acute phase of infection. The lowest concentration of SARS-CoV-2 viral copies this assay can detect is 250 copies / mL. A negative result does not preclude SARS-CoV-2 infection and should not be used as the sole basis for treatment or other patient management decisions.  A negative result may occur with improper specimen collection / handling, submission of specimen other than nasopharyngeal swab, presence of viral mutation(s) within the areas targeted by this assay, and inadequate number of viral copies (<250 copies / mL). A negative result must be combined with clinical observations, patient history, and epidemiological information.  Fact Sheet for Patients:   StrictlyIdeas.no  Fact Sheet for Healthcare Providers: BankingDealers.co.za  This test  is not yet approved or  cleared by the [place] FDA and has been authorized for detection and/or diagnosis of SARS-CoV-2 by FDA under an Emergency Use Authorization (EUA).  This EUA will remain in effect (meaning this test can be used) for the duration of the COVID-19 declaration under Section 564(b)(1) of the Act, 21 U.S.C. section 360bbb-3(b)(1), unless the authorization is terminated or revoked sooner.  Performed at [facility], [place] [address] [place],  [address]         Radiology Studies: CT Abdomen Pelvis Wo Contrast  Result Date: 02/17/2020 CLINICAL DATA:  Nausea, vomiting and elevated white blood cell count. EXAM: CT ABDOMEN AND PELVIS WITHOUT CONTRAST TECHNIQUE: Multidetector CT imaging of the abdomen and pelvis was performed following the standard protocol without IV contrast. COMPARISON:  Prior study from 2015. FINDINGS: Lower chest: Dense left lower lobe airspace consolidation consistent with lobar pneumonia. A moderate-sized adjacent parapneumonic effusion is noted. There is also a small right pleural effusion with overlying atelectasis. The heart is within normal limits in size for age. No pericardial effusion. Aortic and coronary artery calcifications are noted. Hepatobiliary: No hepatic lesions or intrahepatic biliary dilatation. The gallbladder is unremarkable. No common bile duct dilatation. Pancreas: No mass, inflammation or ductal dilatation. Spleen: Normal size.  No  focal lesions. Adrenals/Urinary Tract: The adrenal glands are unremarkable. Bilateral renal cysts are noted. No worrisome renal lesions or hydronephrosis. Renal artery calcifications but no renal calculi. The bladder is unremarkable. Stomach/Bowel: The stomach, duodenum, small bowel and colon are grossly normal without oral contrast. No acute inflammatory changes, mass lesions or obstructive findings. The terminal ileum and appendix are normal. There is significant sigmoid colon diverticulosis but  no findings suspicious for acute diverticulitis. Vascular/Lymphatic: Advanced atherosclerotic calcifications involving the aorta and iliac arteries and branch vessels. No focal aneurysm. No mesenteric or retroperitoneal mass or adenopathy. Small scattered lymph nodes are noted. Reproductive: Enlarged prostate gland. The seminal vesicles are unremarkable. Other: No pelvic mass or adenopathy. No free pelvic fluid collections. No inguinal mass or adenopathy. No abdominal wall hernia or subcutaneous lesions. Musculoskeletal: No significant bony findings. Age related degenerative changes involving the spine and hips. IMPRESSION: 1. Dense left lower lobe airspace consolidation consistent with lobar pneumonia. Associated moderate-sized adjacent parapneumonic effusion. 2. Small right pleural effusion with overlying atelectasis. 3. No acute abdominal/pelvic findings, mass lesions or adenopathy. 4. Advanced atherosclerotic calcifications involving the aorta and iliac arteries and branch vessels. 5. Advanced sigmoid colon diverticulosis but no findings for acute diverticulitis. 6. Simple appearing renal cysts. 7. Enlarged prostate gland. Aortic Atherosclerosis (ICD10-I70.0). Electronically Signed   By: Marijo Sanes M.D.   On: 03/09/2020 16:47   DG Chest 2 View  Result Date: 02/22/2020 CLINICAL DATA:  Dyspnea EXAM: CHEST - 2 VIEW COMPARISON:  02/10/2020 FINDINGS: The lungs are symmetrically well expanded. Interstitial pulmonary edema has significantly improved and is now mild in severity. More focal atelectasis or infiltrate is seen within the left lower lobe. Small bilateral pleural effusions have decreased in size. No pneumothorax. Cardiac size within normal limits. Changes of ankylosing spondylitis are again identified with find a syndesmophytes throughout the visualized thoracolumbar spine. There is, however, intervertebral disc space widening incidentally noted at L1-2, not well profiled on this examination, but stable  since prior exam. IMPRESSION: Improving pulmonary edema, now mild in severity with decreasing small bilateral pleural effusions. More focal pulmonary infiltrate within the left lower lobe. A superimposed focal infectious process or aspiration is difficult to exclude. Osseous changes in keeping with ankylosing spondylitis. Stable disc space widening at L1-2. Correlation for point tenderness would be helpful, however, as this may reflect changes of a Chance fracture. This is not well assessed on this examination and, if indicated, dedicated radiographs of the lumbar spine may be helpful for further evaluation. Electronically Signed   By: Fidela Salisbury MD   On: 02/21/2020 12:48        Scheduled Meds: . apraclonidine  1 drop Left Eye TID  . vitamin C  1,000 mg Oral Daily  . aspirin EC  81 mg Oral Daily  . atorvastatin  10 mg Oral Daily  . heparin  5,000 Units Subcutaneous Q8H  . hydrALAZINE  50 mg Oral BID  . labetalol  100 mg Oral BID  . latanoprost  1 drop Left Eye QHS  . levothyroxine  50 mcg Oral QAC breakfast  . loratadine  10 mg Oral Daily  . magnesium oxide  200 mg Oral Daily  . melatonin  10 mg Oral QHS  . vancomycin variable dose per unstable renal function (pharmacist dosing)   Does not apply See admin instructions   Continuous Infusions: . azithromycin Stopped (02/28/2020 2135)  . cefTRIAXone (ROCEPHIN)  IV Stopped (02/27/2020 2105)     LOS: 0 days  Cordelia Poche, MD [facility] 02/20/2020, 7:35 AM  If 7PM-7AM, please contact night-coverage [email or web address]

## 2020-02-20 NOTE — ED Notes (Addendum)
Pt ate a small portion of his breakfast tray.

## 2020-02-20 NOTE — Progress Notes (Signed)
Pt's daughter states that pt has not had bowel movement since 02/09/20. Pt says he is having discomfort from constipation. MD notified. RN and NT placed pt on bedpan but pt was not able to have bm. MD is ordering Miralax.

## 2020-02-21 ENCOUNTER — Inpatient Hospital Stay (HOSPITAL_COMMUNITY): Payer: Medicare Other

## 2020-02-21 DIAGNOSIS — N179 Acute kidney failure, unspecified: Secondary | ICD-10-CM | POA: Diagnosis not present

## 2020-02-21 DIAGNOSIS — I10 Essential (primary) hypertension: Secondary | ICD-10-CM | POA: Diagnosis not present

## 2020-02-21 DIAGNOSIS — J9811 Atelectasis: Secondary | ICD-10-CM | POA: Diagnosis not present

## 2020-02-21 DIAGNOSIS — J189 Pneumonia, unspecified organism: Secondary | ICD-10-CM | POA: Diagnosis not present

## 2020-02-21 DIAGNOSIS — I350 Nonrheumatic aortic (valve) stenosis: Secondary | ICD-10-CM | POA: Diagnosis not present

## 2020-02-21 LAB — GRAM STAIN

## 2020-02-21 LAB — VANCOMYCIN, RANDOM: Vancomycin Rm: 12

## 2020-02-21 LAB — CBC WITH DIFFERENTIAL/PLATELET
Abs Immature Granulocytes: 0.35 10*3/uL — ABNORMAL HIGH (ref 0.00–0.07)
Basophils Absolute: 0 10*3/uL (ref 0.0–0.1)
Basophils Relative: 0 %
Eosinophils Absolute: 0.1 10*3/uL (ref 0.0–0.5)
Eosinophils Relative: 0 %
HCT: 29.5 % — ABNORMAL LOW (ref 39.0–52.0)
Hemoglobin: 9.7 g/dL — ABNORMAL LOW (ref 13.0–17.0)
Immature Granulocytes: 2 %
Lymphocytes Relative: 5 %
Lymphs Abs: 0.9 10*3/uL (ref 0.7–4.0)
MCH: 29.8 pg (ref 26.0–34.0)
MCHC: 32.9 g/dL (ref 30.0–36.0)
MCV: 90.8 fL (ref 80.0–100.0)
Monocytes Absolute: 0.9 10*3/uL (ref 0.1–1.0)
Monocytes Relative: 5 %
Neutro Abs: 16.7 10*3/uL — ABNORMAL HIGH (ref 1.7–7.7)
Neutrophils Relative %: 88 %
Platelets: 292 10*3/uL (ref 150–400)
RBC: 3.25 MIL/uL — ABNORMAL LOW (ref 4.22–5.81)
RDW: 14.4 % (ref 11.5–15.5)
WBC: 19 10*3/uL — ABNORMAL HIGH (ref 4.0–10.5)
nRBC: 0 % (ref 0.0–0.2)

## 2020-02-21 LAB — STREP PNEUMONIAE URINARY ANTIGEN: Strep Pneumo Urinary Antigen: NEGATIVE

## 2020-02-21 LAB — LACTATE DEHYDROGENASE, PLEURAL OR PERITONEAL FLUID: LD, Fluid: 82 U/L — ABNORMAL HIGH (ref 3–23)

## 2020-02-21 LAB — BODY FLUID CELL COUNT WITH DIFFERENTIAL
Eos, Fluid: 0 %
Lymphs, Fluid: 33 %
Monocyte-Macrophage-Serous Fluid: 3 % — ABNORMAL LOW (ref 50–90)
Neutrophil Count, Fluid: 64 % — ABNORMAL HIGH (ref 0–25)
Total Nucleated Cell Count, Fluid: 920 cu mm (ref 0–1000)

## 2020-02-21 LAB — COMPREHENSIVE METABOLIC PANEL
ALT: 98 U/L — ABNORMAL HIGH (ref 0–44)
AST: 64 U/L — ABNORMAL HIGH (ref 15–41)
Albumin: 2.3 g/dL — ABNORMAL LOW (ref 3.5–5.0)
Alkaline Phosphatase: 220 U/L — ABNORMAL HIGH (ref 38–126)
Anion gap: 15 (ref 5–15)
BUN: 107 mg/dL — ABNORMAL HIGH (ref 8–23)
CO2: 18 mmol/L — ABNORMAL LOW (ref 22–32)
Calcium: 8.7 mg/dL — ABNORMAL LOW (ref 8.9–10.3)
Chloride: 102 mmol/L (ref 98–111)
Creatinine, Ser: 4.04 mg/dL — ABNORMAL HIGH (ref 0.61–1.24)
GFR, Estimated: 13 mL/min — ABNORMAL LOW (ref >60)
Glucose, Bld: 120 mg/dL — ABNORMAL HIGH (ref 70–99)
Potassium: 4 mmol/L (ref 3.5–5.1)
Sodium: 135 mmol/L (ref 135–145)
Total Bilirubin: 1.2 mg/dL (ref 0.3–1.2)
Total Protein: 5.1 g/dL — ABNORMAL LOW (ref 6.5–8.1)

## 2020-02-21 LAB — MRSA PCR SCREENING: MRSA by PCR: NEGATIVE

## 2020-02-21 LAB — LACTATE DEHYDROGENASE: LDH: 156 U/L (ref 98–192)

## 2020-02-21 LAB — URINE CULTURE

## 2020-02-21 LAB — PROTEIN, PLEURAL OR PERITONEAL FLUID: Total protein, fluid: <3 g/dL

## 2020-02-21 MED ORDER — TAMSULOSIN HCL 0.4 MG PO CAPS
0.4000 mg | ORAL_CAPSULE | Freq: Every day | ORAL | Status: DC
  Administered 2020-02-21 – 2020-02-27 (×7): 0.4 mg via ORAL
  Filled 2020-02-21 (×7): qty 1

## 2020-02-21 MED ORDER — LIDOCAINE HCL (PF) 1 % IJ SOLN
INTRAMUSCULAR | Status: AC
  Filled 2020-02-21: qty 30

## 2020-02-21 MED ORDER — HYDRALAZINE HCL 20 MG/ML IJ SOLN: 10.0000 mg | Freq: Four times a day (QID) | INTRAMUSCULAR | Status: DC | PRN

## 2020-02-21 MED ORDER — SODIUM CHLORIDE 0.9 % IV SOLN: INTRAVENOUS | Status: DC

## 2020-02-21 MED ORDER — VANCOMYCIN HCL IN DEXTROSE 1-5 GM/200ML-% IV SOLN
1000.0000 mg | Freq: Once | INTRAVENOUS | Status: AC
  Administered 2020-02-21: 1000 mg via INTRAVENOUS
  Filled 2020-02-21: qty 200

## 2020-02-21 MED ORDER — PHENAZOPYRIDINE HCL 200 MG PO TABS
200.0000 mg | ORAL_TABLET | Freq: Three times a day (TID) | ORAL | Status: DC
  Administered 2020-02-21 – 2020-02-22 (×2): 200 mg via ORAL
  Filled 2020-02-21 (×3): qty 1

## 2020-02-21 NOTE — Progress Notes (Signed)
Pharmacy Antibiotic Note  Alexander Mcdonald is a 87 y.o. male admitted on 03/05/2020 with pneumonia.  Pharmacy has been consulted for vancomycin dosing. Vancomycin level this am 12 mcg/ml.  Plan: Will give vancomycin 1gm IV x 1 now Will not order vancomycin maintenance doses at this time due to unstable renal function F/u MRSA PCR F/u clinical improvement, culture data, narrow abx as indicated    Temp (24hrs), Avg:97.4 F (36.3 C), Min:97.2 F (36.2 C), Max:98.1 F (36.7 C)  Recent Labs  Lab 02/18/20 1020 03/02/2020 1244 02/20/2020 1356 03/03/2020 1426 03/03/2020 1814 02/20/20 0353 02/21/20 0045  WBC 23.8* 21.1*  --   --   --  18.3* 19.0*  CREATININE 3.56*  --   --  4.43*  --  4.35*  --   LATICACIDVEN  --   --  1.7  --  1.0  --   --   VANCORANDOM  --   --   --   --   --   --  12    Thanks for allowing pharmacy to be a part of this patient's care.  Excell Seltzer, PharmD Clinical Pharmacist  02/21/2020 1:45 AM

## 2020-02-21 NOTE — Progress Notes (Addendum)
PROGRESS NOTE    Alexander Mcdonald  EVO:[id number] DOB: 1926-08-14 DOA: 03/14/2020 PCP: Isaac Bliss, Rayford Halsted, MD   Brief Narrative: Alexander Mcdonald is a 87 y.o.  male with medical history significant of history of HTN, bladder spasm, BPH, bradycardia, history of PVCs, carotid artery disease, gout, glaucoma, thrombosed hemorrhoid, hyperlipidemia, hypertension, prostate cancer, rosacea, unsteadiness. Patient presented secondary to an elevated creatinine and found to have an AKI in addition to a LLL pneumonia. Nephrology consulted. IV antibiotics initiated.   Assessment & Plan:   Principal Problem:   Acute renal failure superimposed on stage 4 chronic kidney disease, unspecified acute renal failure type (HCC) Active Problems:   HYPERCHOLESTEROLEMIA   Essential hypertension   CAP (community acquired pneumonia)   Hyponatremia   Hypothyroidism   Aortic stenosis   Glaucoma   AKI on CKD stage IV Baseline creatinine of about 2.2-2.4. Creatinine of 4.43 on admission. Given IV fluids on admission. No hydronephrosis on CT abdomen/pelvis. Improving slowly -Continue IV fluids -Daily CMP  Community acquired pneumonia LLL pneumonia seen on CT imaging.  -Continue Ceftriaxone/Azithromycin -Follow-up strep pneumoniae/legionella ur ag, sputum culture -Consult IR for thoracentesis in setting of ?parapneumonic effusion  Acute respiratory failure with hypoxia Likely secondary to pneumonia. SpO2 as low as 87% on room air on admission. Initially placed on 2 lpm of oxygen -Continue oxygen and wean to room air as able -Keep SpO2 > 92%  Acute urinary retention Unknown etiology. Foley catheter placed on 2/4. Urinalysis does not suggest infection, but does show pyuria. Urine culture is pending. On antibiotics to cover CAP. -Follow-up urine culture  Bradycardia Resolved after discontinuing labetalol -Continue Telemetry  Hyponatremia Mild. Improved with IV fluids.  Hyperlipidemia -Continue  Lipitor  Aortic stenosis Patient with severe disease with plan for consideration of TAVR as an outpatient.  Constipation Improved with dulcolax suppository -Continue Miralax BID  Primary hypertension -Continue hydralazine, labetalol  Hypothyroidism -Continue Synthroid  Glaucoma -Continue apraclonidine, latanoprost   DVT prophylaxis: Heparin subq Code Status:   Code Status: Full Code Family Communication: Called daughter with no response (02/21/2020) Disposition Plan: Discharge home vs SNF likely in several days pending PT/OT recs, improvement of AKI, transition to oral antibiotics   Consultants:   Nephrology  Procedures:   None  Antimicrobials:  Vancomycin  Ceftriaxone  Azithromycin    Subjective: Feels bad. Cannot say why he feels bad.  Objective: Vitals:   02/20/20 2300 02/21/20 0300 02/21/20 0400 02/21/20 0500  BP: (!) 143/80 (!) 153/46 (!) 139/53 (!) 146/43  Pulse: 90 92 73 77  Resp: (!) 23 20 18 19   Temp:  97.7 F (36.5 C)    TempSrc:  Axillary    SpO2: 92% 94% 94% 94%    Intake/Output Summary (Last 24 hours) at 02/21/2020 0904 Last data filed at 02/21/2020 0315 Gross per 24 hour  Intake 800 ml  Output 775 ml  Net 25 ml   There were no vitals filed for this visit.  Examination:  General exam: Appears calm and comfortable Respiratory system: Diminished breath sounds on left. Respiratory effort normal. Cardiovascular system: S1 & S2 heard, RRR. Systolic murmur Gastrointestinal system: Abdomen is nondistended, soft and nontender. No organomegaly or masses felt. Normal bowel sounds heard. Central nervous system: Alert and oriented. No focal neurological deficits. Musculoskeletal: No edema. No calf tenderness Skin: No cyanosis. No rashes Psychiatry: Judgement and insight appear normal. Mood & affect appropriate.     Data Reviewed: I have personally reviewed following labs and imaging studies  CBC Lab Results  Component Value Date   WBC  19.0 (H) 02/21/2020   RBC 3.25 (L) 02/21/2020   HGB 9.7 (L) 02/21/2020   HCT 29.5 (L) 02/21/2020   MCV 90.8 02/21/2020   MCH 29.8 02/21/2020   PLT 292 02/21/2020   MCHC 32.9 02/21/2020   RDW 14.4 02/21/2020   LYMPHSABS 0.9 02/21/2020   MONOABS 0.9 02/21/2020   EOSABS 0.1 02/21/2020   BASOSABS 0.0 81/27/5170     Last metabolic panel Lab Results  Component Value Date   NA 135 02/21/2020   K 4.0 02/21/2020   CL 102 02/21/2020   CO2 18 (L) 02/21/2020   BUN 107 (H) 02/21/2020   CREATININE 4.04 (H) 02/21/2020   GLUCOSE 120 (H) 02/21/2020   GFRNONAA 13 (L) 02/21/2020   GFRAA 16 (L) 02/18/2020   CALCIUM 8.7 (L) 02/21/2020   PHOS 3.4 07/11/2017   PROT 5.1 (L) 02/21/2020   ALBUMIN 2.3 (L) 02/21/2020   BILITOT 1.2 02/21/2020   ALKPHOS 220 (H) 02/21/2020   AST 64 (H) 02/21/2020   ALT 98 (H) 02/21/2020   ANIONGAP 15 02/21/2020    CBG (last 3)  No results for input(s): GLUCAP in the last 72 hours.   GFR: Estimated Creatinine Clearance: 10.4 mL/min (A) (by C-G formula based on SCr of 4.04 mg/dL (H)).  Coagulation Profile: Recent Labs  Lab 03/05/2020 1507  INR 1.2    Recent Results (from the past 240 hour(s))  Blood culture (routine single)     Status: None (Preliminary result)   Collection Time: 03/03/2020  1:56 PM   Specimen: BLOOD LEFT HAND  Result Value Ref Range Status   Specimen Description BLOOD LEFT HAND  Final   Special Requests   Final    BOTTLES DRAWN AEROBIC ONLY Blood Culture results may not be optimal due to an inadequate volume of blood received in culture bottles   Culture   Final    NO GROWTH < 24 HOURS Performed at [facility], [place] [address] [place], [place] [address]    Report Status PENDING  Incomplete  SARS Coronavirus 2 by RT PCR (hospital order, performed in [facility] hospital lab) Nasopharyngeal Nasopharyngeal Swab     Status: None   Collection Time: 03/01/2020  2:41 PM   Specimen: Nasopharyngeal Swab  Result Value Ref Range Status    SARS Coronavirus 2 NEGATIVE NEGATIVE Final    Comment: (NOTE) SARS-CoV-2 target nucleic acids are NOT DETECTED.  The SARS-CoV-2 RNA is generally detectable in upper and lower respiratory specimens during the acute phase of infection. The lowest concentration of SARS-CoV-2 viral copies this assay can detect is 250 copies / mL. A negative result does not preclude SARS-CoV-2 infection and should not be used as the sole basis for treatment or other patient management decisions.  A negative result may occur with improper specimen collection / handling, submission of specimen other than nasopharyngeal swab, presence of viral mutation(s) within the areas targeted by this assay, and inadequate number of viral copies (<250 copies / mL). A negative result must be combined with clinical observations, patient history, and epidemiological information.  Fact Sheet for Patients:   StrictlyIdeas.no  Fact Sheet for Healthcare Providers: BankingDealers.co.za  This test is not yet approved or  cleared by the [place] FDA and has been authorized for detection and/or diagnosis of SARS-CoV-2 by FDA under an Emergency Use Authorization (EUA).  This EUA will remain in effect (meaning this test can be used) for the duration  of the COVID-19 declaration under Section 564(b)(1) of the Act, 21 U.S.C. section 360bbb-3(b)(1), unless the authorization is terminated or revoked sooner.  Performed at [facility], [place] [address], [place], [place] [address]   Urine culture     Status: Abnormal   Collection Time: 02/20/20 11:42 AM   Specimen: In/Out Cath Urine  Result Value Ref Range Status   Specimen Description IN/OUT CATH URINE  Final   Special Requests   Final    NONE Performed at [facility], [place] [address], [place], [place] [address]    Culture MULTIPLE SPECIES PRESENT, SUGGEST RECOLLECTION (A)  Final   Report Status 02/21/2020 FINAL  Final         Radiology Studies: CT Abdomen Pelvis Wo Contrast  Result Date: 02/17/2020 CLINICAL DATA:  Nausea, vomiting and elevated white blood cell count. EXAM: CT ABDOMEN AND PELVIS WITHOUT CONTRAST TECHNIQUE: Multidetector CT imaging of the abdomen and pelvis was performed following the standard protocol without IV contrast. COMPARISON:  Prior study from 2015. FINDINGS: Lower chest: Dense left lower lobe airspace consolidation consistent with lobar pneumonia. A moderate-sized adjacent parapneumonic effusion is noted. There is also a small right pleural effusion with overlying atelectasis. The heart is within normal limits in size for age. No pericardial effusion. Aortic and coronary artery calcifications are noted. Hepatobiliary: No hepatic lesions or intrahepatic biliary dilatation. The gallbladder is unremarkable. No common bile duct dilatation. Pancreas: No mass, inflammation or ductal dilatation. Spleen: Normal size.  No focal lesions. Adrenals/Urinary Tract: The adrenal glands are unremarkable. Bilateral renal cysts are noted. No worrisome renal lesions or hydronephrosis. Renal artery calcifications but no renal calculi. The bladder is unremarkable. Stomach/Bowel: The stomach, duodenum, small bowel and colon are grossly normal without oral contrast. No acute inflammatory changes, mass lesions or obstructive findings. The terminal ileum and appendix are normal. There is significant sigmoid colon diverticulosis but no findings suspicious for acute diverticulitis. Vascular/Lymphatic: Advanced atherosclerotic calcifications involving the aorta and iliac arteries and branch vessels. No focal aneurysm. No mesenteric or retroperitoneal mass or adenopathy. Small scattered lymph nodes are noted. Reproductive: Enlarged prostate gland. The seminal vesicles are unremarkable. Other: No pelvic mass or adenopathy. No free pelvic fluid collections. No inguinal mass or adenopathy. No abdominal wall hernia or subcutaneous  lesions. Musculoskeletal: No significant bony findings. Age related degenerative changes involving the spine and hips. IMPRESSION: 1. Dense left lower lobe airspace consolidation consistent with lobar pneumonia. Associated moderate-sized adjacent parapneumonic effusion. 2. Small right pleural effusion with overlying atelectasis. 3. No acute abdominal/pelvic findings, mass lesions or adenopathy. 4. Advanced atherosclerotic calcifications involving the aorta and iliac arteries and branch vessels. 5. Advanced sigmoid colon diverticulosis but no findings for acute diverticulitis. 6. Simple appearing renal cysts. 7. Enlarged prostate gland. Aortic Atherosclerosis (ICD10-I70.0). Electronically Signed   By: Marijo Sanes M.D.   On: 02/28/2020 16:47   DG Chest 2 View  Result Date: 03/06/2020 CLINICAL DATA:  Dyspnea EXAM: CHEST - 2 VIEW COMPARISON:  02/10/2020 FINDINGS: The lungs are symmetrically well expanded. Interstitial pulmonary edema has significantly improved and is now mild in severity. More focal atelectasis or infiltrate is seen within the left lower lobe. Small bilateral pleural effusions have decreased in size. No pneumothorax. Cardiac size within normal limits. Changes of ankylosing spondylitis are again identified with find a syndesmophytes throughout the visualized thoracolumbar spine. There is, however, intervertebral disc space widening incidentally noted at L1-2, not well profiled on this examination, but stable since prior exam. IMPRESSION:  Improving pulmonary edema, now mild in severity with decreasing small bilateral pleural effusions. More focal pulmonary infiltrate within the left lower lobe. A superimposed focal infectious process or aspiration is difficult to exclude. Osseous changes in keeping with ankylosing spondylitis. Stable disc space widening at L1-2. Correlation for point tenderness would be helpful, however, as this may reflect changes of a Chance fracture. This is not well assessed on  this examination and, if indicated, dedicated radiographs of the lumbar spine may be helpful for further evaluation. Electronically Signed   By: Fidela Salisbury MD   On: 02/18/2020 12:48        Scheduled Meds: . apraclonidine  1 drop Left Eye TID  . vitamin C  1,000 mg Oral Daily  . aspirin EC  81 mg Oral Daily  . atorvastatin  10 mg Oral Daily  . bisacodyl  10 mg Rectal Once  . Chlorhexidine Gluconate Cloth  6 each Topical Daily  . heparin  5,000 Units Subcutaneous Q8H  . hydrALAZINE  50 mg Oral BID  . latanoprost  1 drop Left Eye QHS  . levothyroxine  50 mcg Oral QAC breakfast  . loratadine  10 mg Oral Daily  . magnesium oxide  200 mg Oral Daily  . melatonin  10 mg Oral QHS  . polyethylene glycol  17 g Oral BID   Continuous Infusions: . sodium chloride 100 mL/hr at 02/21/20 0801  . azithromycin 500 mg (02/20/20 1935)  . cefTRIAXone (ROCEPHIN)  IV 2 g (02/20/20 1821)     LOS: 1 day     Cordelia Poche, MD [facility] 02/21/2020, 9:04 AM  If 7PM-7AM, please contact night-coverage [email or web address]

## 2020-02-21 NOTE — Evaluation (Signed)
Physical Therapy Evaluation Patient Details Name: Alexander Mcdonald MRN: [id number] DOB: May 18, 1926 Today's Date: 02/21/2020   History of Present Illness  The pt is a 87 yo male presenting from home with abnormal labs (WBC 23.8, Creatinine 3.56, BUN 83). Upon workup, pt found to have AKI on CKD IV, CAP. PMH includes: aortic stenosis with TAVR pending, glaucoma (legally blind, some sight at midline in L eye), HTN, CAD, HLD, postate cancer, and "unsteadiness"    Clinical Impression  Pt in bed upon arrival of PT, agreeable to evaluation at this time. Prior to admission the pt was mobilizing with RW within the home, receiving assist from daughter and [facility] aides to complete mobility and ADLs. The pt was about to start [facility] therapies prior to this admission, but had not yet begun. The pt now presents with limitations in functional mobility, endurance, strength, power, stability, and safety awareness due to above dx, and will continue to benefit from skilled PT to address these deficits. The pt was able to complete initial bed mobility and transfers with minA to complete movements, power up, and max cues for technique/positioning. The pt will continue to benefit from skilled PT to progress functional activity tolerance, strength, and stability to facilitate return to prior mobility and reduce caregiver burden upon return home.      Follow Up Recommendations Home health PT;Supervision/Assistance - 24 hour (24/7 supervision initially, may progress) - Pt and daughter would like to resume prior [facility] services.     Equipment Recommendations  None recommended by PT (pt well equipped)    Recommendations for Other Services       Precautions / Restrictions Precautions Precautions: Fall Precaution Comments: per daughter, has slid to floor during stand-sit multiple times in last year, legally blind but some vision at midline in L eye. Restrictions Weight Bearing Restrictions: No      Mobility  Bed Mobility Overal  bed mobility: Needs Assistance Bed Mobility: Supine to Sit;Sit to Supine     Supine to sit: Min assist;HOB elevated Sit to supine: Mod assist   General bed mobility comments: minA to move BLE to EOB, then minA to raise trunk from elevated HOB. modA to manage BLE and trunk to return to bed    Transfers Overall transfer level: Needs assistance Equipment used: Rolling walker (2 wheeled) Transfers: Sit to/from Stand Sit to Stand: Min assist         General transfer comment: minA to power up, pt benefits from [facility] for hand placement, continued to move BUE to RW grip despite cues  Ambulation/Gait Ambulation/Gait assistance: Min assist Gait Distance (Feet): 3 Feet Assistive device: Rolling walker (2 wheeled) Gait Pattern/deviations: Step-to pattern;Decreased stride length;Shuffle;Trunk flexed;Narrow base of support Gait velocity: decr   General Gait Details: small lateral steps with significant cues increased time to generate small lateral steps with minimal clearance. assist to manage RW and maintain upright      Balance Overall balance assessment: Needs assistance;History of Falls Sitting-balance support: Bilateral upper extremity supported;Feet supported Sitting balance-Leahy Scale: Poor Sitting balance - Comments: UE supported   Standing balance support: Bilateral upper extremity supported Standing balance-Leahy Scale: Poor Standing balance comment: reliant on BUE support                             Pertinent Vitals/Pain Pain Assessment: Faces Faces Pain Scale: Hurts little more Pain Location: generalized with movement Pain Descriptors / Indicators: Grimacing;Moaning Pain Intervention(s): Limited activity within patient's  tolerance;Monitored during session;Repositioned    Home Living Family/patient expects to be discharged to:: Private residence Living Arrangements: Children Available Help at Discharge: Family;Available PRN/intermittently Type of Home:  House Home Access: Stairs to enter   Entrance Stairs-Number of Steps: 1 Home Layout: Two level;Able to live on main level with bedroom/bathroom Home Equipment: Gilford Rile - 2 wheels;Bedside commode;Shower seat;Wheelchair - manual Additional Comments: lives with daughter [name] who works (can work mostly from home, but does go in to office sometimes) has aides who can come to assist with bathing or while daughter goes in to work    Prior Function Level of Independence: Needs assistance      ADL's / Land Needed: daughter or [place] aides help with pants and socks, and with bathing (he stands in a shower, she helps him reach hard to reach parts)  Comments: walks with RW, assist with bathing and dressing     Hand Dominance   Dominant Hand: Right    Extremity/Trunk Assessment   Upper Extremity Assessment Upper Extremity Assessment: Generalized weakness    Lower Extremity Assessment Lower Extremity Assessment: Generalized weakness (lacking full ROM at knees bilaterally, able to use functionally to stand with assist, limited endurance globally)    Cervical / Trunk Assessment Cervical / Trunk Assessment: Normal  Communication   Communication: HOH  Cognition Arousal/Alertness: Awake/alert Behavior During Therapy: WFL for tasks assessed/performed Overall Cognitive Status: History of cognitive impairments - at baseline                                 General Comments: pt with slight decreased insight to safety at baseline, able to follow all instructions today, but benefits from encouragement and verbal cues for all technique/positioning/mobility      General Comments General comments (skin integrity, edema, etc.): HR 90s-100s with mobility, BP stable and SpO2 stable on RA    Exercises     Assessment/Plan    PT Assessment Patient needs continued PT services  PT Problem List Decreased activity tolerance;Decreased range of motion;Decreased  mobility;Decreased balance;Decreased safety awareness       PT Treatment Interventions Gait training;Therapeutic activities;Therapeutic exercise;Functional mobility training;Patient/family education    PT Goals (Current goals can be found in the Care Plan section)  Acute Rehab PT Goals Patient Stated Goal: to get stronger PT Goal Formulation: With patient/family Time For Goal Achievement: 03/06/20 Potential to Achieve Goals: Fair    Frequency Min 3X/week    AM-PAC PT "6 Clicks" Mobility  Outcome Measure Help needed turning from your back to your side while in a flat bed without using bedrails?: A Little Help needed moving from lying on your back to sitting on the side of a flat bed without using bedrails?: A Little Help needed moving to and from a bed to a chair (including a wheelchair)?: A Lot Help needed standing up from a chair using your arms (e.g., wheelchair or bedside chair)?: A Little Help needed to walk in hospital room?: A Lot Help needed climbing 3-5 steps with a railing? : A Lot 6 Click Score: 15    End of Session Equipment Utilized During Treatment: Gait belt Activity Tolerance: Patient tolerated treatment well Patient left: in bed;with call bell/phone within reach;with bed alarm set;with family/visitor present Nurse Communication: Mobility status PT Visit Diagnosis: Difficulty in walking, not elsewhere classified (R26.2);History of falling (Z91.81)    Time: 2563-8937 PT Time Calculation (min) (ACUTE ONLY): 32 min   Charges:  PT Evaluation $PT Eval Moderate Complexity: 1 Mod PT Treatments $Therapeutic Activity: 8-22 mins        Karma Ganja, PT, DPT   Acute Rehabilitation Department Pager #: [phone]  Otho Bellows 02/21/2020, 3:17 PM

## 2020-02-21 NOTE — Procedures (Signed)
PROCEDURE SUMMARY:  Successful US guided left thoracentesis. Yielded 400 ml of clear yellow fluid. Pt tolerated procedure well. No immediate complications.  Specimen sent for labs. CXR ordered; no post-procedure pneumothorax identified  EBL < 5 mL  Theresa Duty, NP 02/21/2020 11:31 AM

## 2020-02-22 DIAGNOSIS — I350 Nonrheumatic aortic (valve) stenosis: Secondary | ICD-10-CM | POA: Diagnosis not present

## 2020-02-22 DIAGNOSIS — J189 Pneumonia, unspecified organism: Secondary | ICD-10-CM | POA: Diagnosis not present

## 2020-02-22 DIAGNOSIS — N179 Acute kidney failure, unspecified: Secondary | ICD-10-CM | POA: Diagnosis not present

## 2020-02-22 DIAGNOSIS — I10 Essential (primary) hypertension: Secondary | ICD-10-CM | POA: Diagnosis not present

## 2020-02-22 LAB — CBC WITH DIFFERENTIAL/PLATELET
Abs Immature Granulocytes: 0.73 10*3/uL — ABNORMAL HIGH (ref 0.00–0.07)
Basophils Absolute: 0 10*3/uL (ref 0.0–0.1)
Basophils Relative: 0 %
Eosinophils Absolute: 0 10*3/uL (ref 0.0–0.5)
Eosinophils Relative: 0 %
HCT: 26.5 % — ABNORMAL LOW (ref 39.0–52.0)
Hemoglobin: 9.1 g/dL — ABNORMAL LOW (ref 13.0–17.0)
Immature Granulocytes: 4 %
Lymphocytes Relative: 7 %
Lymphs Abs: 1.2 10*3/uL (ref 0.7–4.0)
MCH: 30.6 pg (ref 26.0–34.0)
MCHC: 34.3 g/dL (ref 30.0–36.0)
MCV: 89.2 fL (ref 80.0–100.0)
Monocytes Absolute: 0.9 10*3/uL (ref 0.1–1.0)
Monocytes Relative: 5 %
Neutro Abs: 15.9 10*3/uL — ABNORMAL HIGH (ref 1.7–7.7)
Neutrophils Relative %: 84 %
Platelets: 302 10*3/uL (ref 150–400)
RBC: 2.97 MIL/uL — ABNORMAL LOW (ref 4.22–5.81)
RDW: 14.5 % (ref 11.5–15.5)
WBC: 18.9 10*3/uL — ABNORMAL HIGH (ref 4.0–10.5)
nRBC: 0 % (ref 0.0–0.2)

## 2020-02-22 LAB — COMPREHENSIVE METABOLIC PANEL
ALT: 105 U/L — ABNORMAL HIGH (ref 0–44)
AST: 74 U/L — ABNORMAL HIGH (ref 15–41)
Albumin: 2.2 g/dL — ABNORMAL LOW (ref 3.5–5.0)
Alkaline Phosphatase: 216 U/L — ABNORMAL HIGH (ref 38–126)
Anion gap: 12 (ref 5–15)
BUN: 86 mg/dL — ABNORMAL HIGH (ref 8–23)
CO2: 18 mmol/L — ABNORMAL LOW (ref 22–32)
Calcium: 9.2 mg/dL (ref 8.9–10.3)
Chloride: 108 mmol/L (ref 98–111)
Creatinine, Ser: 3.07 mg/dL — ABNORMAL HIGH (ref 0.61–1.24)
GFR, Estimated: 18 mL/min — ABNORMAL LOW (ref >60)
Glucose, Bld: 145 mg/dL — ABNORMAL HIGH (ref 70–99)
Potassium: 3.4 mmol/L — ABNORMAL LOW (ref 3.5–5.1)
Sodium: 138 mmol/L (ref 135–145)
Total Bilirubin: 1 mg/dL (ref 0.3–1.2)
Total Protein: 5.2 g/dL — ABNORMAL LOW (ref 6.5–8.1)

## 2020-02-22 LAB — MAGNESIUM: Magnesium: 2.6 mg/dL — ABNORMAL HIGH (ref 1.7–2.4)

## 2020-02-22 NOTE — Evaluation (Signed)
Occupational Therapy Evaluation Patient Details Name: Alexander Mcdonald MRN: [id number] DOB: 05/23/1926 Today's Date: 02/22/2020    History of Present Illness The pt is a 87 yo male presenting from home with abnormal labs (WBC 23.8, Creatinine 3.56, BUN 83). Upon workup, pt found to have AKI on CKD IV, CAP. PMH includes: aortic stenosis with TAVR pending, glaucoma (legally blind, some sight at midline in L eye), HTN, CAD, HLD, postate cancer, and "unsteadiness"   Clinical Impression   Pt ambulated with a walker and reports self feeding, grooming and toileting independently. He is assisted for bathing, dressing and IADL. Pt presents with low vision (per daughter he has minimal central vision in L eye only), generalized weakness and impaired balance. Pt requires set up to total assist for ADL, mod assist for bed mobility and min assist to stand and transfer to chair. Will follow acutely.     Follow Up Recommendations  Home health OT;Supervision/Assistance - 24 hour    Equipment Recommendations  None recommended by OT    Recommendations for Other Services       Precautions / Restrictions Precautions Precautions: Fall Precaution Comments: per daughter, has slid to floor during stand-sit multiple times in last year, legally blind but some vision at midline in L eye.      Mobility Bed Mobility Overal bed mobility: Needs Assistance Bed Mobility: Supine to Sit     Supine to sit: Mod assist     General bed mobility comments: assist for LEs over EOB and to raise trunk    Transfers Overall transfer level: Needs assistance Equipment used: Rolling walker (2 wheeled) Transfers: Sit to/from [facility] Sit to Stand: Min assist Stand pivot transfers: Min assist       General transfer comment: assist to rise and steady, line management    Balance Overall balance assessment: Needs assistance;History of Falls Sitting-balance support: Bilateral upper extremity  supported;Feet supported Sitting balance-Leahy Scale: Fair     Standing balance support: Bilateral upper extremity supported Standing balance-Leahy Scale: Poor Standing balance comment: reliant on BUE support and physical guidance due to low vision                           ADL either performed or assessed with clinical judgement   ADL Overall ADL's : Needs assistance/impaired Eating/Feeding: Set up;Sitting Eating/Feeding Details (indicate cue type and reason): uses both hands due to vision Grooming: Wash/dry hands;Wash/dry face;Sitting;Set up   Upper Body Bathing: Moderate assistance;Sitting   Lower Body Bathing: Maximal assistance;Sit to/from stand   Upper Body Dressing : Moderate assistance;Sitting   Lower Body Dressing: Maximal assistance;Sit to/from stand   Toilet Transfer: Minimal assistance;Stand-pivot;RW;BSC   Toileting- Clothing Manipulation and Hygiene: Total assistance;Sit to/from stand               Vision Baseline Vision/History: Legally blind Patient Visual Report: No change from baseline       Perception     Praxis      Pertinent Vitals/Pain Pain Assessment: No/denies pain     Hand Dominance Right   Extremity/Trunk Assessment Upper Extremity Assessment Upper Extremity Assessment: Generalized weakness   Lower Extremity Assessment Lower Extremity Assessment: Defer to PT evaluation   Cervical / Trunk Assessment Cervical / Trunk Assessment: Normal   Communication Communication Communication: HOH   Cognition Arousal/Alertness: Awake/alert Behavior During Therapy: Anxious Overall Cognitive Status: History of cognitive impairments - at baseline  General Comments: provided step by step cues due to low vision   General Comments       Exercises     Shoulder Instructions      Home Living Family/patient expects to be discharged to:: Private residence Living Arrangements:  Children Available Help at Discharge: Family;Available PRN/intermittently Type of Home: House Home Access: Stairs to enter Entrance Stairs-Number of Steps: 1   Home Layout: Two level;Able to live on main level with bedroom/bathroom Alternate Level Stairs-Number of Steps: flight   Bathroom Shower/Tub: Occupational psychologist: Standard (3 in 1 over)     Home Equipment: Walker - 2 wheels;Bedside commode;Shower seat;Wheelchair - manual   Additional Comments: lives with daughter [name] who works (can work mostly from home, but does go in to office sometimes) has aides who can come to assist with bathing or while daughter goes in to work      Prior Functioning/Environment Level of Independence: Needs Product/process development scientist / Transfers Assistance Needed: walks with RW ADL's / Homemaking Assistance Needed: daughter or [name] aides help with pants and socks, and with bathing (he stands in a shower, she helps him reach hard to reach parts), reports he can self feed and toilet independently            OT Problem List: Decreased strength;Decreased activity tolerance;Impaired balance (sitting and/or standing);Decreased knowledge of use of DME or AE;Decreased cognition;Decreased safety awareness;Impaired vision/perception      OT Treatment/Interventions: Self-care/ADL training;DME and/or AE instruction;Therapeutic activities;Patient/family education;Balance training    OT Goals(Current goals can be found in the care plan section) Acute Rehab OT Goals Patient Stated Goal: to get stronger OT Goal Formulation: With patient Time For Goal Achievement: 03/07/20 Potential to Achieve Goals: Good ADL Goals Pt Will Perform Grooming: with min assist;standing Pt Will Transfer to Toilet: with min assist;ambulating;bedside commode (over toilet) Pt Will Perform Toileting - Clothing Manipulation and hygiene: with min assist;sit to/from stand Additional ADL Goal #1: Pt will perform bed mobility with min  assist in preparation for ADL.  OT Frequency: Min 2X/week   Barriers to D/C:            Co-evaluation              AM-PAC OT "6 Clicks" Daily Activity     Outcome Measure Help from another person eating meals?: A Little Help from another person taking care of personal grooming?: A Little Help from another person toileting, which includes using toliet, bedpan, or urinal?: Total Help from another person bathing (including washing, rinsing, drying)?: A Lot Help from another person to put on and taking off regular upper body clothing?: A Lot Help from another person to put on and taking off regular lower body clothing?: A Lot 6 Click Score: 13   End of Session Equipment Utilized During Treatment: Gait belt;Rolling walker Nurse Communication: Mobility status  Activity Tolerance: Patient tolerated treatment well Patient left: in chair;with call bell/phone within reach;with chair alarm set;with nursing/sitter in room  OT Visit Diagnosis: Unsteadiness on feet (R26.81);Other abnormalities of gait and mobility (R26.89);Muscle weakness (generalized) (M62.81);Other symptoms and signs involving cognitive function;Low vision, both eyes (H54.2)                Time: 5885-0277 OT Time Calculation (min): 23 min Charges:  OT General Charges $OT Visit: 1 Visit OT Evaluation $OT Eval Moderate Complexity: 1 Mod OT Treatments $Self Care/Home Management : 8-22 mins  Nestor Lewandowsky, OTR/L Acute Rehabilitation Services Pager: [phone] Office: [phone]  Malka So 02/22/2020, 11:19 AM

## 2020-02-22 NOTE — Progress Notes (Signed)
PROGRESS NOTE    Alexander Mcdonald  YTK:[id number] DOB: 30-Jun-1926 DOA: 03/14/2020 PCP: Isaac Bliss, Rayford Halsted, MD   Brief Narrative: Alexander Mcdonald is a 87 y.o.  male with medical history significant of history of HTN, bladder spasm, BPH, bradycardia, history of PVCs, carotid artery disease, gout, glaucoma, thrombosed hemorrhoid, hyperlipidemia, hypertension, prostate cancer, rosacea, unsteadiness. Patient presented secondary to an elevated creatinine and found to have an AKI in addition to a LLL pneumonia. Nephrology consulted. IV antibiotics initiated.   Assessment & Plan:   Principal Problem:   Acute renal failure superimposed on stage 4 chronic kidney disease, unspecified acute renal failure type (HCC) Active Problems:   HYPERCHOLESTEROLEMIA   Essential hypertension   CAP (community acquired pneumonia)   Hyponatremia   Hypothyroidism   Aortic stenosis   Glaucoma   AKI on CKD stage IV Baseline creatinine of about 2.2-2.4. Creatinine of 4.43 on admission. Given IV fluids on admission. No hydronephrosis on CT abdomen/pelvis. Improving slowly -Continue IV fluids -Daily CMP  Community acquired pneumonia LLL pneumonia seen on CT imaging. Patient underwent thoracentesis on 2/5 yielding 400 mL of clear yellow fluid; fluid analysis suggest probable transudate but difficult to be exact secondary to undetectable protein in pleural fluid.  -Continue Ceftriaxone/Azithromycin -Follow-up strep pneumoniae/legionella ur ag, sputum culture -Follow-up body fluid culture/cytology  Acute respiratory failure with hypoxia Likely secondary to pneumonia. SpO2 as low as 87% on room air on admission. Initially placed on 2 lpm of oxygen -Continue oxygen and wean to room air as able -Keep SpO2 > 92%  Acute urinary retention Unknown etiology. Foley catheter placed on 2/4. Urinalysis does not suggest infection, but does show pyuria. Urine culture (2/4) with multiple species. On antibiotics to  cover CAP. -Follow-up urine culture  Bradycardia Resolved after discontinuing labetalol -Continue Telemetry  Hyponatremia Mild. Improved with IV fluids.  Hyperlipidemia -Continue Lipitor  Aortic stenosis Patient with severe disease with plan for consideration of TAVR as an outpatient.  Constipation Improved with dulcolax suppository -Continue Miralax BID  Primary hypertension -Continue hydralazine, labetalol  Hypothyroidism -Continue Synthroid  Glaucoma -Continue apraclonidine, latanoprost   DVT prophylaxis: Heparin subq Code Status:   Code Status: Full Code Family Communication: Daughter on telephone (6 minutes) Disposition Plan: Discharge home likely in 2-3 days pending improvement of AKI, transition to oral antibiotics   Consultants:   Nephrology  Procedures:   None  Antimicrobials:  Vancomycin  Ceftriaxone  Azithromycin    Subjective: No concerns. No dyspnea. States he has no bladder pain.  Objective: Vitals:   02/21/20 2017 02/21/20 2300 02/22/20 0300 02/22/20 0747  BP:  (!) 152/31 (!) 150/48 (!) 173/45  Pulse: 89 78 78 99  Resp: 19 20 20 20   Temp:  97.9 F (36.6 C) 97.6 F (36.4 C) 97.6 F (36.4 C)  TempSrc:  Oral Oral Oral  SpO2: 93% 95% 90% 91%    Intake/Output Summary (Last 24 hours) at 02/22/2020 5573 Last data filed at 02/22/2020 0600 Gross per 24 hour  Intake 2345.26 ml  Output 1600 ml  Net 745.26 ml   There were no vitals filed for this visit.  Examination:  General exam: Appears calm and comfortable Respiratory system: Clear to auscultation. Respiratory effort normal. Cardiovascular system: S1 & S2 heard, RRR. 2/6 systolic murmur Gastrointestinal system: Abdomen is nondistended, soft and nontender. No organomegaly or masses felt. Normal bowel sounds heard. Central nervous system: Asleep but arouses easily. Oriented. Musculoskeletal: No edema. No calf tenderness Skin: No cyanosis. No rashes Psychiatry:  Judgement and  insight appear normal. Mood & affect appropriate.     Data Reviewed: I have personally reviewed following labs and imaging studies  CBC Lab Results  Component Value Date   WBC 18.9 (H) 02/22/2020   RBC 2.97 (L) 02/22/2020   HGB 9.1 (L) 02/22/2020   HCT 26.5 (L) 02/22/2020   MCV 89.2 02/22/2020   MCH 30.6 02/22/2020   PLT 302 02/22/2020   MCHC 34.3 02/22/2020   RDW 14.5 02/22/2020   LYMPHSABS 1.2 02/22/2020   MONOABS 0.9 02/22/2020   EOSABS 0.0 02/22/2020   BASOSABS 0.0 38/10/1749     Last metabolic panel Lab Results  Component Value Date   NA 138 02/22/2020   K 3.4 (L) 02/22/2020   CL 108 02/22/2020   CO2 18 (L) 02/22/2020   BUN 86 (H) 02/22/2020   CREATININE 3.07 (H) 02/22/2020   GLUCOSE 145 (H) 02/22/2020   GFRNONAA 18 (L) 02/22/2020   GFRAA 16 (L) 02/18/2020   CALCIUM 9.2 02/22/2020   PHOS 3.4 07/11/2017   PROT 5.2 (L) 02/22/2020   ALBUMIN 2.2 (L) 02/22/2020   BILITOT 1.0 02/22/2020   ALKPHOS 216 (H) 02/22/2020   AST 74 (H) 02/22/2020   ALT 105 (H) 02/22/2020   ANIONGAP 12 02/22/2020    CBG (last 3)  No results for input(s): GLUCAP in the last 72 hours.   GFR: Estimated Creatinine Clearance: 13.6 mL/min (A) (by C-G formula based on SCr of 3.07 mg/dL (H)).  Coagulation Profile: Recent Labs  Lab 02/23/2020 1507  INR 1.2    Recent Results (from the past 240 hour(s))  Blood culture (routine single)     Status: None (Preliminary result)   Collection Time: 02/18/2020  1:56 PM   Specimen: BLOOD LEFT HAND  Result Value Ref Range Status   Specimen Description BLOOD LEFT HAND  Final   Special Requests   Final    BOTTLES DRAWN AEROBIC ONLY Blood Culture results may not be optimal due to an inadequate volume of blood received in culture bottles   Culture   Final    NO GROWTH 2 DAYS Performed at [facility], [place] [address], [place], [place] [address]    Report Status PENDING  Incomplete  SARS Coronavirus 2 by RT PCR (hospital order, performed in [facility] hospital lab) Nasopharyngeal Nasopharyngeal Swab     Status: None   Collection Time: 02/22/2020  2:41 PM   Specimen: Nasopharyngeal Swab  Result Value Ref Range Status   SARS Coronavirus 2 NEGATIVE NEGATIVE Final    Comment: (NOTE) SARS-CoV-2 target nucleic acids are NOT DETECTED.  The SARS-CoV-2 RNA is generally detectable in upper and lower respiratory specimens during the acute phase of infection. The lowest concentration of SARS-CoV-2 viral copies this assay can detect is 250 copies / mL. A negative result does not preclude SARS-CoV-2 infection and should not be used as the sole basis for treatment or other patient management decisions.  A negative result may occur with improper specimen collection / handling, submission of specimen other than nasopharyngeal swab, presence of viral mutation(s) within the areas targeted by this assay, and inadequate number of viral copies (<250 copies / mL). A negative result must be combined with clinical observations, patient history, and epidemiological information.  Fact Sheet for Patients:   StrictlyIdeas.no  Fact Sheet for Healthcare Providers: BankingDealers.co.za  This test is not yet approved or  cleared by the [place] FDA and has been authorized for detection and/or diagnosis of SARS-CoV-2  by FDA under an Emergency Use Authorization (EUA).  This EUA will remain in effect (meaning this test can be used) for the duration of the COVID-19 declaration under Section 564(b)(1) of the Act, 21 U.S.C. section 360bbb-3(b)(1), unless the authorization is terminated or revoked sooner.  Performed at [facility], [place] [address], [place], [place] [address]   MRSA PCR Screening     Status: None   Collection Time: 02/21/2020  7:52 PM   Specimen: Nasopharyngeal  Result Value Ref Range Status   MRSA by PCR NEGATIVE NEGATIVE Final    Comment:        The GeneXpert MRSA Assay  (FDA approved for NASAL specimens only), is one component of a comprehensive MRSA colonization surveillance program. It is not intended to diagnose MRSA infection nor to guide or monitor treatment for MRSA infections. Performed at [facility], [place] [address], [place], [place] [address]   Urine culture     Status: Abnormal   Collection Time: 02/20/20 11:42 AM   Specimen: In/Out Cath Urine  Result Value Ref Range Status   Specimen Description IN/OUT CATH URINE  Final   Special Requests   Final    NONE Performed at [facility], [place] [name], [place], [place] [address]    Culture MULTIPLE SPECIES PRESENT, SUGGEST RECOLLECTION (A)  Final   Report Status 02/21/2020 FINAL  Final  Gram stain     Status: None   Collection Time: 02/21/20 11:14 AM   Specimen: PATH Cytology Pleural fluid  Result Value Ref Range Status   Specimen Description PLEURAL FLUID LEFT  Final   Special Requests NONE  Final   Gram Stain   Final    ABUNDANT WBC PRESENT,BOTH PMN AND MONONUCLEAR NO ORGANISMS SEEN Performed at [facility], [place] [address], [place], [place] [address]    Report Status 02/21/2020 FINAL  Final  Culture, Urine     Status: None (Preliminary result)   Collection Time: 02/21/20  7:52 PM   Specimen: Urine, Catheterized  Result Value Ref Range Status   Specimen Description URINE, CATHETERIZED  Final   Special Requests   Final    STERILE CUP Performed at [facility], [place] [address] [place], [place] [address]    Culture PENDING  Incomplete   Report Status PENDING  Incomplete        Radiology Studies: DG Chest 1 View  Result Date: 02/21/2020 CLINICAL DATA:  Status post thoracentesis. EXAM: CHEST  1 VIEW COMPARISON:  February 19, 2020. FINDINGS: The heart size and mediastinal contours are within normal limits. No pneumothorax or significant pleural effusion is noted. Mild bibasilar subsegmental atelectasis is noted. The visualized skeletal structures are  unremarkable. IMPRESSION: Mild bibasilar subsegmental atelectasis. No pneumothorax is noted. Electronically Signed   By: Marijo Conception M.D.   On: 02/21/2020 11:25   US THORACENTESIS ASP PLEURAL SPACE W/IMG GUIDE  Result Date: 02/21/2020 INDICATION: Patient with a history of pneumonia presents today with left pleural effusion. Interventional radiology asked to perform a therapeutic and diagnostic thoracentesis. EXAM: ULTRASOUND GUIDED THORACENTESIS MEDICATIONS: 1% lidocaine 10 mL COMPLICATIONS: None immediate. PROCEDURE: An ultrasound guided thoracentesis was thoroughly discussed with the patient and questions answered. The benefits, risks, alternatives and complications were also discussed. The patient understands and wishes to proceed with the procedure. Written consent was obtained. Ultrasound was performed to localize and mark an adequate pocket of fluid in the left chest. The area was then prepped and  draped in the normal sterile fashion. 1% Lidocaine was used for local anesthesia. Under ultrasound guidance a 6 Fr Safe-T-Centesis catheter was introduced. Thoracentesis was performed. The catheter was removed and a dressing applied. FINDINGS: A total of approximately 400 mL of clear yellow fluid was removed. Samples were sent to the laboratory as requested by the clinical team. IMPRESSION: Successful ultrasound guided left thoracentesis yielding 400 mL of pleural fluid. Read by: Soyla Dryer, NP Electronically Signed   By: Sandi Mariscal M.D.   On: 02/21/2020 11:37        Scheduled Meds: . apraclonidine  1 drop Left Eye TID  . vitamin C  1,000 mg Oral Daily  . aspirin EC  81 mg Oral Daily  . atorvastatin  10 mg Oral Daily  . bisacodyl  10 mg Rectal Once  . Chlorhexidine Gluconate Cloth  6 each Topical Daily  . heparin  5,000 Units Subcutaneous Q8H  . hydrALAZINE  50 mg Oral BID  . latanoprost  1 drop Left Eye QHS  . levothyroxine  50 mcg Oral QAC breakfast  . loratadine  10 mg Oral Daily  .  magnesium oxide  200 mg Oral Daily  . melatonin  10 mg Oral QHS  . phenazopyridine  200 mg Oral TID WC  . polyethylene glycol  17 g Oral BID  . tamsulosin  0.4 mg Oral QPC supper   Continuous Infusions: . sodium chloride 100 mL/hr at 02/22/20 0600  . azithromycin 500 mg (02/21/20 1830)  . cefTRIAXone (ROCEPHIN)  IV 2 g (02/21/20 1740)     LOS: 2 days     Cordelia Poche, MD [facility] 02/22/2020, 8:11 AM  If 7PM-7AM, please contact night-coverage [email or web address]

## 2020-02-23 ENCOUNTER — Inpatient Hospital Stay (HOSPITAL_COMMUNITY): Payer: Medicare Other

## 2020-02-23 DIAGNOSIS — I350 Nonrheumatic aortic (valve) stenosis: Secondary | ICD-10-CM | POA: Diagnosis not present

## 2020-02-23 DIAGNOSIS — I10 Essential (primary) hypertension: Secondary | ICD-10-CM | POA: Diagnosis not present

## 2020-02-23 DIAGNOSIS — N179 Acute kidney failure, unspecified: Secondary | ICD-10-CM | POA: Diagnosis not present

## 2020-02-23 DIAGNOSIS — J189 Pneumonia, unspecified organism: Secondary | ICD-10-CM | POA: Diagnosis not present

## 2020-02-23 LAB — COMPREHENSIVE METABOLIC PANEL
ALT: 127 U/L — ABNORMAL HIGH (ref 0–44)
AST: 148 U/L — ABNORMAL HIGH (ref 15–41)
Albumin: 2.2 g/dL — ABNORMAL LOW (ref 3.5–5.0)
Alkaline Phosphatase: 231 U/L — ABNORMAL HIGH (ref 38–126)
Anion gap: 12 (ref 5–15)
BUN: 70 mg/dL — ABNORMAL HIGH (ref 8–23)
CO2: 17 mmol/L — ABNORMAL LOW (ref 22–32)
Calcium: 8.8 mg/dL — ABNORMAL LOW (ref 8.9–10.3)
Chloride: 111 mmol/L (ref 98–111)
Creatinine, Ser: 2.61 mg/dL — ABNORMAL HIGH (ref 0.61–1.24)
GFR, Estimated: 22 mL/min — ABNORMAL LOW (ref >60)
Glucose, Bld: 198 mg/dL — ABNORMAL HIGH (ref 70–99)
Potassium: 3.7 mmol/L (ref 3.5–5.1)
Sodium: 140 mmol/L (ref 135–145)
Total Bilirubin: 0.9 mg/dL (ref 0.3–1.2)
Total Protein: 5.1 g/dL — ABNORMAL LOW (ref 6.5–8.1)

## 2020-02-23 LAB — HEPATITIS PANEL, ACUTE
HCV Ab: NONREACTIVE
Hep A IgM: NONREACTIVE
Hep B C IgM: NONREACTIVE
Hepatitis B Surface Ag: NONREACTIVE

## 2020-02-23 LAB — URINE CULTURE

## 2020-02-23 LAB — CBC
HCT: 25.9 % — ABNORMAL LOW (ref 39.0–52.0)
Hemoglobin: 8.2 g/dL — ABNORMAL LOW (ref 13.0–17.0)
MCH: 29.3 pg (ref 26.0–34.0)
MCHC: 31.7 g/dL (ref 30.0–36.0)
MCV: 92.5 fL (ref 80.0–100.0)
Platelets: 307 10*3/uL (ref 150–400)
RBC: 2.8 MIL/uL — ABNORMAL LOW (ref 4.22–5.81)
RDW: 14.6 % (ref 11.5–15.5)
WBC: 20.2 10*3/uL — ABNORMAL HIGH (ref 4.0–10.5)
nRBC: 0.1 % (ref 0.0–0.2)

## 2020-02-23 LAB — BRAIN NATRIURETIC PEPTIDE: B Natriuretic Peptide: 2091 pg/mL — ABNORMAL HIGH (ref 0.0–100.0)

## 2020-02-23 LAB — LEGIONELLA PNEUMOPHILA SEROGP 1 UR AG: L. pneumophila Serogp 1 Ur Ag: NEGATIVE

## 2020-02-23 LAB — CYTOLOGY - NON PAP

## 2020-02-23 MED ORDER — FUROSEMIDE 10 MG/ML IJ SOLN
20.0000 mg | Freq: Once | INTRAMUSCULAR | Status: AC
  Administered 2020-02-23: 20 mg via INTRAVENOUS
  Filled 2020-02-23: qty 2

## 2020-02-23 MED ORDER — ALBUTEROL SULFATE (2.5 MG/3ML) 0.083% IN NEBU
2.5000 mg | INHALATION_SOLUTION | Freq: Four times a day (QID) | RESPIRATORY_TRACT | Status: DC | PRN
  Administered 2020-02-23: 2.5 mg via RESPIRATORY_TRACT

## 2020-02-23 MED ORDER — ALBUTEROL SULFATE (2.5 MG/3ML) 0.083% IN NEBU
INHALATION_SOLUTION | RESPIRATORY_TRACT | Status: AC
  Filled 2020-02-23: qty 3

## 2020-02-23 MED ORDER — LABETALOL HCL 100 MG PO TABS
50.0000 mg | ORAL_TABLET | Freq: Two times a day (BID) | ORAL | Status: DC
  Administered 2020-02-23 – 2020-02-27 (×9): 50 mg via ORAL
  Filled 2020-02-23: qty 1
  Filled 2020-02-23: qty 0.5
  Filled 2020-02-23: qty 1
  Filled 2020-02-23: qty 0.5
  Filled 2020-02-23: qty 1
  Filled 2020-02-23 (×5): qty 0.5
  Filled 2020-02-23: qty 1
  Filled 2020-02-23: qty 0.5

## 2020-02-23 NOTE — Plan of Care (Signed)

## 2020-02-23 NOTE — Care Management Important Message (Signed)
Important Message  Patient Details  Name: Alexander Mcdonald MRN: [id number] Date of Birth: 06-Nov-1926   Medicare Important Message Given:  Yes     Orbie Pyo 02/23/2020, 2:51 PM

## 2020-02-23 NOTE — Progress Notes (Signed)
Pt c/o SOB tonight. O2 Sats=89-90 % on RA.[address] pt on O2 @2L /min [place]. Sats up to =92-94%. Fine crackles noted.  Dr Marlowe Sax informed. Stat BNP done. RT called & reassessed pt. Breathing treatment given (Albuterol) as ordered. BNP result=2,091. Lasix 20 mg IV given as ordered. Urine output total=1500 ml. Pt verbalized feeling better. Will continue to monitor pt.

## 2020-02-23 NOTE — Progress Notes (Signed)
PROGRESS NOTE    Alexander Mcdonald  JKK:[id number] DOB: 1926/12/13 DOA: 02/20/2020 PCP: Isaac Bliss, Rayford Halsted, MD   Brief Narrative: Alexander Mcdonald is a 87 y.o.  male with medical history significant of history of HTN, bladder spasm, BPH, bradycardia, history of PVCs, carotid artery disease, gout, glaucoma, thrombosed hemorrhoid, hyperlipidemia, hypertension, prostate cancer, rosacea, unsteadiness. Patient presented secondary to an elevated creatinine and found to have an AKI in addition to a LLL pneumonia. Nephrology consulted. IV antibiotics initiated.   Assessment & Plan:   Principal Problem:   Acute renal failure superimposed on stage 4 chronic kidney disease, unspecified acute renal failure type (HCC) Active Problems:   HYPERCHOLESTEROLEMIA   Essential hypertension   CAP (community acquired pneumonia)   Hyponatremia   Hypothyroidism   Aortic stenosis   Glaucoma   AKI on CKD stage IV Baseline creatinine of about 2.2-2.4. Creatinine of 4.43 on admission. In setting of recent heart catheterization. Given IV fluids on admission. No hydronephrosis on CT abdomen/pelvis. Improving slowly -Continue IV fluids -Daily CMP  Community acquired pneumonia LLL pneumonia seen on CT imaging. Patient underwent thoracentesis on 2/5 yielding 400 mL of clear yellow fluid; fluid analysis suggest probable transudate but difficult to be exact secondary to undetectable protein in pleural fluid.  -Continue Ceftriaxone/Azithromycin -Follow-up strep pneumoniae/legionella ur ag, sputum culture -Follow-up body fluid culture/cytology  Acute respiratory failure with hypoxia Likely secondary to pneumonia. SpO2 as low as 87% on room air on admission. Initially placed on 2 lpm of oxygen and weaned to room air. Overnight on 2/6, patient developed some dyspnea concern for fluid overload. BNP significantly elevated. Given Lasix with improvement -Continue oxygen and wean to room air as able -Keep SpO2 >  92% -Chest x-ray  Acute urinary retention Unknown etiology. Foley catheter placed on 2/4. Urinalysis does not suggest infection, but does show pyuria. Urine cultures (2/4 and 2/5) with multiple species. On antibiotics to cover CAP. -Remove catheter and attempt voiding trial today  Bradycardia Tachycardia Bradycardia resolved after discontinuing labetalol. Now with tachycardia -Continue Telemetry -Restart home labetalol at 50 mg BID  Hyponatremia Mild. Improved with IV fluids.  Hyperlipidemia -Continue Lipitor  Aortic stenosis Patient with severe disease with plan for consideration of TAVR as an outpatient.  Constipation Improved with dulcolax suppository -Continue Miralax BID  Primary hypertension -Continue hydralazine, labetalol  Hypothyroidism -Continue Synthroid  Glaucoma -Continue apraclonidine, latanoprost   DVT prophylaxis: Heparin subq Code Status:   Code Status: Full Code Family Communication: Daughter on telephone (14 minutes) Disposition Plan: Discharge home likely in 2-3 days pending improvement of AKI, transition to oral antibiotics   Consultants:   None  Procedures:   None  Antimicrobials:  Vancomycin  Ceftriaxone  Azithromycin   Subjective: Overnight, he had some orthopnea that responded well to Lasix. No dyspnea this morning.  Objective: Vitals:   02/23/20 0003 02/23/20 0104 02/23/20 0321 02/23/20 0801  BP: 137/65  (!) 139/96 127/63  Pulse: 83  (!) 106 86  Resp: 18  20 (!) 23  Temp: 97.8 F (36.6 C)  98.1 F (36.7 C) 98.1 F (36.7 C)  TempSrc: Oral  Oral Axillary  SpO2: 92% 95% 96% 95%    Intake/Output Summary (Last 24 hours) at 02/23/2020 0845 Last data filed at 02/23/2020 0309 Gross per 24 hour  Intake 2267.5 ml  Output 400 ml  Net 1867.5 ml   There were no vitals filed for this visit.  Examination:  General exam: Appears calm and comfortable Respiratory system: Clear  to auscultation. Respiratory effort  normal. Cardiovascular system: S1 & S2 heard, tachycardia. 2/6 systolic murmur Gastrointestinal system: Abdomen is nondistended, soft and nontender. No organomegaly or masses felt. Slightly hyperactive bowel sounds Central nervous system: Alert and oriented. No focal neurological deficits. Musculoskeletal: No edema. No calf tenderness Skin: No cyanosis. No rashes Psychiatry: Judgement and insight appear normal. Depressed mood, flat affect    Data Reviewed: I have personally reviewed following labs and imaging studies  CBC Lab Results  Component Value Date   WBC 20.2 (H) 02/23/2020   RBC 2.80 (L) 02/23/2020   HGB 8.2 (L) 02/23/2020   HCT 25.9 (L) 02/23/2020   MCV 92.5 02/23/2020   MCH 29.3 02/23/2020   PLT 307 02/23/2020   MCHC 31.7 02/23/2020   RDW 14.6 02/23/2020   LYMPHSABS 1.2 02/22/2020   MONOABS 0.9 02/22/2020   EOSABS 0.0 02/22/2020   BASOSABS 0.0 96/75/9163     Last metabolic panel Lab Results  Component Value Date   NA 140 02/23/2020   K 3.7 02/23/2020   CL 111 02/23/2020   CO2 17 (L) 02/23/2020   BUN 70 (H) 02/23/2020   CREATININE 2.61 (H) 02/23/2020   GLUCOSE 198 (H) 02/23/2020   GFRNONAA 22 (L) 02/23/2020   GFRAA 16 (L) 02/18/2020   CALCIUM 8.8 (L) 02/23/2020   PHOS 3.4 07/11/2017   PROT 5.1 (L) 02/23/2020   ALBUMIN 2.2 (L) 02/23/2020   BILITOT 0.9 02/23/2020   ALKPHOS 231 (H) 02/23/2020   AST 148 (H) 02/23/2020   ALT 127 (H) 02/23/2020   ANIONGAP 12 02/23/2020    CBG (last 3)  No results for input(s): GLUCAP in the last 72 hours.   GFR: Estimated Creatinine Clearance: 16 mL/min (A) (by C-G formula based on SCr of 2.61 mg/dL (H)).  Coagulation Profile: Recent Labs  Lab 02/20/2020 1507  INR 1.2    Recent Results (from the past 240 hour(s))  Blood culture (routine single)     Status: None (Preliminary result)   Collection Time: 02/18/2020  1:56 PM   Specimen: BLOOD LEFT HAND  Result Value Ref Range Status   Specimen Description BLOOD LEFT  HAND  Final   Special Requests   Final    BOTTLES DRAWN AEROBIC ONLY Blood Culture results may not be optimal due to an inadequate volume of blood received in culture bottles   Culture   Final    NO GROWTH 3 DAYS Performed at [facility], [place] [address], [place], [place] [address]    Report Status PENDING  Incomplete  SARS Coronavirus 2 by RT PCR (hospital order, performed in [facility] hospital lab) Nasopharyngeal Nasopharyngeal Swab     Status: None   Collection Time: 02/28/2020  2:41 PM   Specimen: Nasopharyngeal Swab  Result Value Ref Range Status   SARS Coronavirus 2 NEGATIVE NEGATIVE Final    Comment: (NOTE) SARS-CoV-2 target nucleic acids are NOT DETECTED.  The SARS-CoV-2 RNA is generally detectable in upper and lower respiratory specimens during the acute phase of infection. The lowest concentration of SARS-CoV-2 viral copies this assay can detect is 250 copies / mL. A negative result does not preclude SARS-CoV-2 infection and should not be used as the sole basis for treatment or other patient management decisions.  A negative result may occur with improper specimen collection / handling, submission of specimen other than nasopharyngeal swab, presence of viral mutation(s) within the areas targeted by this assay, and inadequate number of viral copies (<250 copies / mL). A  negative result must be combined with clinical observations, patient history, and epidemiological information.  Fact Sheet for Patients:   StrictlyIdeas.no  Fact Sheet for Healthcare Providers: BankingDealers.co.za  This test is not yet approved or  cleared by the [place] FDA and has been authorized for detection and/or diagnosis of SARS-CoV-2 by FDA under an Emergency Use Authorization (EUA).  This EUA will remain in effect (meaning this test can be used) for the duration of the COVID-19 declaration under Section 564(b)(1) of the Act, 21  U.S.C. section 360bbb-3(b)(1), unless the authorization is terminated or revoked sooner.  Performed at [facility], [place] [address], [place], [place] [address]   MRSA PCR Screening     Status: None   Collection Time: 02/27/2020  7:52 PM   Specimen: Nasopharyngeal  Result Value Ref Range Status   MRSA by PCR NEGATIVE NEGATIVE Final    Comment:        The GeneXpert MRSA Assay (FDA approved for NASAL specimens only), is one component of a comprehensive MRSA colonization surveillance program. It is not intended to diagnose MRSA infection nor to guide or monitor treatment for MRSA infections. Performed at [facility], [place] [address] [place], [place] [address]   Urine culture     Status: Abnormal   Collection Time: 02/20/20 11:42 AM   Specimen: In/Out Cath Urine  Result Value Ref Range Status   Specimen Description IN/OUT CATH URINE  Final   Special Requests   Final    NONE Performed at [facility], [place] [address], [place], [place] [address]    Culture MULTIPLE SPECIES PRESENT, SUGGEST RECOLLECTION (A)  Final   Report Status 02/21/2020 FINAL  Final  Gram stain     Status: None   Collection Time: 02/21/20 11:14 AM   Specimen: PATH Cytology Pleural fluid  Result Value Ref Range Status   Specimen Description PLEURAL FLUID LEFT  Final   Special Requests NONE  Final   Gram Stain   Final    ABUNDANT WBC PRESENT,BOTH PMN AND MONONUCLEAR NO ORGANISMS SEEN Performed at [facility], [place] [address], [place], [place] [address]    Report Status 02/21/2020 FINAL  Final  Culture, body fluid-bottle     Status: None (Preliminary result)   Collection Time: 02/21/20 11:14 AM   Specimen: Pleura  Result Value Ref Range Status   Specimen Description PLEURAL FLUID LEFT  Final   Special Requests NONE  Final   Culture   Final    NO GROWTH < 24 HOURS Performed at [facility], [place] [address], [place], [place] [address]    Report Status PENDING  Incomplete   Culture, Urine     Status: Abnormal   Collection Time: 02/21/20  7:52 PM   Specimen: Urine, Catheterized  Result Value Ref Range Status   Specimen Description URINE, CATHETERIZED  Final   Special Requests   Final    STERILE CUP Performed at [facility], [place] [address] [place], [place] [address]    Culture MULTIPLE SPECIES PRESENT, SUGGEST RECOLLECTION (A)  Final   Report Status 02/23/2020 FINAL  Final        Radiology Studies: DG Chest 1 View  Result Date: 02/21/2020 CLINICAL DATA:  Status post thoracentesis. EXAM: CHEST  1 VIEW COMPARISON:  February 19, 2020. FINDINGS: The heart size and mediastinal contours are within normal limits. No pneumothorax or significant pleural effusion is noted. Mild bibasilar subsegmental atelectasis is noted.  The visualized skeletal structures are unremarkable. IMPRESSION: Mild bibasilar subsegmental atelectasis. No pneumothorax is noted. Electronically Signed   By: Marijo Conception M.D.   On: 02/21/2020 11:25   DG CHEST PORT 1 VIEW  Result Date: 02/23/2020 CLINICAL DATA:  Weakness. EXAM: PORTABLE CHEST 1 VIEW COMPARISON:  February 21, 2020. FINDINGS: Stable cardiomediastinal silhouette. No pneumothorax is noted. Increased bibasilar opacities is noted concerning for subsegmental atelectasis. Bony thorax is unremarkable. IMPRESSION: Increased bibasilar opacities are noted concerning for subsegmental atelectasis. Aortic Atherosclerosis (ICD10-I70.0). Electronically Signed   By: Marijo Conception M.D.   On: 02/23/2020 08:03   US THORACENTESIS ASP PLEURAL SPACE W/IMG GUIDE  Result Date: 02/21/2020 INDICATION: Patient with a history of pneumonia presents today with left pleural effusion. Interventional radiology asked to perform a therapeutic and diagnostic thoracentesis. EXAM: ULTRASOUND GUIDED THORACENTESIS MEDICATIONS: 1% lidocaine 10 mL COMPLICATIONS: None immediate. PROCEDURE: An ultrasound guided thoracentesis was thoroughly discussed with the patient and  questions answered. The benefits, risks, alternatives and complications were also discussed. The patient understands and wishes to proceed with the procedure. Written consent was obtained. Ultrasound was performed to localize and mark an adequate pocket of fluid in the left chest. The area was then prepped and draped in the normal sterile fashion. 1% Lidocaine was used for local anesthesia. Under ultrasound guidance a 6 Fr Safe-T-Centesis catheter was introduced. Thoracentesis was performed. The catheter was removed and a dressing applied. FINDINGS: A total of approximately 400 mL of clear yellow fluid was removed. Samples were sent to the laboratory as requested by the clinical team. IMPRESSION: Successful ultrasound guided left thoracentesis yielding 400 mL of pleural fluid. Read by: Soyla Dryer, NP Electronically Signed   By: Sandi Mariscal M.D.   On: 02/21/2020 11:37        Scheduled Meds: . apraclonidine  1 drop Left Eye TID  . vitamin C  1,000 mg Oral Daily  . aspirin EC  81 mg Oral Daily  . atorvastatin  10 mg Oral Daily  . bisacodyl  10 mg Rectal Once  . Chlorhexidine Gluconate Cloth  6 each Topical Daily  . heparin  5,000 Units Subcutaneous Q8H  . hydrALAZINE  50 mg Oral BID  . labetalol  50 mg Oral BID  . latanoprost  1 drop Left Eye QHS  . levothyroxine  50 mcg Oral QAC breakfast  . loratadine  10 mg Oral Daily  . magnesium oxide  200 mg Oral Daily  . melatonin  10 mg Oral QHS  . polyethylene glycol  17 g Oral BID  . tamsulosin  0.4 mg Oral QPC supper   Continuous Infusions: . azithromycin 500 mg (02/22/20 1749)  . cefTRIAXone (ROCEPHIN)  IV 2 g (02/22/20 1646)     LOS: 3 days     Cordelia Poche, MD [facility] 02/23/2020, 8:45 AM  If 7PM-7AM, please contact night-coverage [email or web address]

## 2020-02-23 NOTE — Progress Notes (Signed)
Physical Therapy Treatment Patient Details Name: Alexander Mcdonald MRN: [id number] DOB: Mar 18, 1926 Today's Date: 02/23/2020    History of Present Illness The pt is a 87 yo male presenting from home with abnormal labs (WBC 23.8, Creatinine 3.56, BUN 83). Upon workup, pt found to have AKI on CKD IV, CAP. PMH includes: aortic stenosis with TAVR pending, glaucoma (legally blind, some sight at midline in L eye), HTN, CAD, HLD, postate cancer, and "unsteadiness"    PT Comments    Pt's daughter in room on entry and helpful in encouraging pt to get up with therapy. Pt limited in safe mobility by increased pain in anterior lateral thigh with movement. Pt requires modA for bed mobility, min A for transfers and min A for stepping transfer to recliner. Once in sitting applied moist heat to thigh which pt reports helped "a little". D/c plans remain appropriate. PT will continue to follow acutely.    Follow Up Recommendations  Home health PT;Supervision/Assistance - 24 hour (24/7 supervision initially, may progress)     Equipment Recommendations  None recommended by PT (pt well equipped)       Precautions / Restrictions Precautions Precautions: Fall Precaution Comments: per daughter, has slid to floor during stand-sit multiple times in last year, legally blind but some vision at midline in L eye.    Mobility  Bed Mobility Overal bed mobility: Needs Assistance Bed Mobility: Supine to Sit     Supine to sit: Mod assist     General bed mobility comments: modA for managing LE off bed due to R LE pain, assist for bringing trunk to upright  Transfers Overall transfer level: Needs assistance Equipment used: Rolling walker (2 wheeled) Transfers: Sit to/from Stand Sit to Stand: Min assist         General transfer comment: min A for power up, despite cuing reaches up for RW to pull to upright  Ambulation/Gait Ambulation/Gait assistance: Min assist Gait Distance (Feet): 3 Feet Assistive device:  Rolling walker (2 wheeled) Gait Pattern/deviations: Step-to pattern;Decreased stride length;Shuffle;Trunk flexed;Narrow base of support Gait velocity: decr Gait velocity interpretation: <1.31 ft/sec, indicative of household ambulator General Gait Details: min A for stepping toward recliner on his L, assist for steadying and cuing for increased foot clearance         Balance Overall balance assessment: Needs assistance;History of Falls Sitting-balance support: Bilateral upper extremity supported;Feet supported Sitting balance-Leahy Scale: Poor Sitting balance - Comments: UE supported   Standing balance support: Bilateral upper extremity supported Standing balance-Leahy Scale: Poor Standing balance comment: reliant on BUE support                            Cognition Arousal/Alertness: Awake/alert Behavior During Therapy: WFL for tasks assessed/performed Overall Cognitive Status: History of cognitive impairments - at baseline                                 General Comments: requires increased cuing for mobility, especially due to decreased vision, daughter present who fills in blanks         General Comments General comments (skin integrity, edema, etc.): VSS on RA, bowel movement in standing once in front of chair      Pertinent Vitals/Pain Pain Assessment: Faces Faces Pain Scale: Hurts even more Pain Location: R thigh with movement Pain Descriptors / Indicators: Grimacing;Moaning Pain Intervention(s): Limited activity within patient's tolerance;Monitored during session;Repositioned;Heat  applied           PT Goals (current goals can now be found in the care plan section) Acute Rehab PT Goals Patient Stated Goal: to get stronger PT Goal Formulation: With patient/family Time For Goal Achievement: 03/06/20 Potential to Achieve Goals: Fair Progress towards PT goals: Progressing toward goals    Frequency    Min 3X/week      PT Plan  Current plan remains appropriate       AM-PAC PT "6 Clicks" Mobility   Outcome Measure  Help needed turning from your back to your side while in a flat bed without using bedrails?: A Little Help needed moving from lying on your back to sitting on the side of a flat bed without using bedrails?: A Little Help needed moving to and from a bed to a chair (including a wheelchair)?: A Lot Help needed standing up from a chair using your arms (e.g., wheelchair or bedside chair)?: A Little Help needed to walk in hospital room?: A Lot Help needed climbing 3-5 steps with a railing? : A Lot 6 Click Score: 15    End of Session Equipment Utilized During Treatment: Gait belt Activity Tolerance: Patient limited by pain (limited by R thigh pain with movement) Patient left: with family/visitor present;in chair;with chair alarm set;with call bell/phone within reach Nurse Communication: Mobility status PT Visit Diagnosis: Difficulty in walking, not elsewhere classified (R26.2);History of falling (Z91.81)     Time: 3545-6256 PT Time Calculation (min) (ACUTE ONLY): 26 min  Charges:  $Therapeutic Activity: 23-37 mins                     Elizabeth B. Migdalia Dk PT, DPT Acute Rehabilitation Services Pager [phone] Office [phone]    [place] 02/23/2020, 4:47 PM

## 2020-02-24 DIAGNOSIS — I350 Nonrheumatic aortic (valve) stenosis: Secondary | ICD-10-CM | POA: Diagnosis not present

## 2020-02-24 DIAGNOSIS — I10 Essential (primary) hypertension: Secondary | ICD-10-CM | POA: Diagnosis not present

## 2020-02-24 DIAGNOSIS — N179 Acute kidney failure, unspecified: Secondary | ICD-10-CM | POA: Diagnosis not present

## 2020-02-24 DIAGNOSIS — J189 Pneumonia, unspecified organism: Secondary | ICD-10-CM | POA: Diagnosis not present

## 2020-02-24 LAB — COMPREHENSIVE METABOLIC PANEL
ALT: 181 U/L — ABNORMAL HIGH (ref 0–44)
AST: 175 U/L — ABNORMAL HIGH (ref 15–41)
Albumin: 2.2 g/dL — ABNORMAL LOW (ref 3.5–5.0)
Alkaline Phosphatase: 237 U/L — ABNORMAL HIGH (ref 38–126)
Anion gap: 11 (ref 5–15)
BUN: 71 mg/dL — ABNORMAL HIGH (ref 8–23)
CO2: 24 mmol/L (ref 22–32)
Calcium: 9.6 mg/dL (ref 8.9–10.3)
Chloride: 110 mmol/L (ref 98–111)
Creatinine, Ser: 2.43 mg/dL — ABNORMAL HIGH (ref 0.61–1.24)
GFR, Estimated: 24 mL/min — ABNORMAL LOW (ref >60)
Glucose, Bld: 204 mg/dL — ABNORMAL HIGH (ref 70–99)
Potassium: 3.6 mmol/L (ref 3.5–5.1)
Sodium: 145 mmol/L (ref 135–145)
Total Bilirubin: 0.9 mg/dL (ref 0.3–1.2)
Total Protein: 5 g/dL — ABNORMAL LOW (ref 6.5–8.1)

## 2020-02-24 LAB — CBC
HCT: 23.9 % — ABNORMAL LOW (ref 39.0–52.0)
Hemoglobin: 7.9 g/dL — ABNORMAL LOW (ref 13.0–17.0)
MCH: 30.4 pg (ref 26.0–34.0)
MCHC: 33.1 g/dL (ref 30.0–36.0)
MCV: 91.9 fL (ref 80.0–100.0)
Platelets: 290 10*3/uL (ref 150–400)
RBC: 2.6 MIL/uL — ABNORMAL LOW (ref 4.22–5.81)
RDW: 14.8 % (ref 11.5–15.5)
WBC: 19 10*3/uL — ABNORMAL HIGH (ref 4.0–10.5)
nRBC: 0.4 % — ABNORMAL HIGH (ref 0.0–0.2)

## 2020-02-24 LAB — CULTURE, BLOOD (SINGLE): Culture: NO GROWTH

## 2020-02-24 NOTE — Progress Notes (Signed)
Physical Therapy Treatment Patient Details Name: Alexander Mcdonald MRN: [id number] DOB: 1926-12-07 Today's Date: 02/24/2020    History of Present Illness The pt is a 87 yo male presenting from home with abnormal labs (WBC 23.8, Creatinine 3.56, BUN 83). Upon workup, pt found to have AKI on CKD IV, CAP. PMH includes: aortic stenosis with TAVR pending, glaucoma (legally blind, some sight at midline in L eye), HTN, CAD, HLD, postate cancer, and "unsteadiness"    PT Comments    Pt lying in bed, reports he has urinated but that he could not find the soft touch call bell to let the RN know. Pt and RN both relieved as Foley was removed this morning. After pt cleaned up, he is making good progress towards his goals. Pt initially has complaints of R thigh pain which he says is not as bad when he gets up to the chair. Pt is min A for bed mobility, transfers and ambulation of 10 feet with RW. D/c plans remain appropriate. PT will continue to follow acutely.      Follow Up Recommendations  Home health PT;Supervision/Assistance - 24 hour (24/7 supervision initially, may progress)     Equipment Recommendations  None recommended by PT (pt well equipped)       Precautions / Restrictions Precautions Precautions: Fall Precaution Comments: per daughter, has slid to floor during stand-sit multiple times in last year, legally blind but some vision at midline in L eye. Restrictions Weight Bearing Restrictions: No    Mobility  Bed Mobility Overal bed mobility: Needs Assistance Bed Mobility: Supine to Sit     Supine to sit: Min assist     General bed mobility comments: min A for managment of LE across bed, some grimace with movement of R LE, min A for hand hold assist for pt to pull up to side of bed  Transfers Overall transfer level: Needs assistance Equipment used: Rolling walker (2 wheeled) Transfers: Sit to/from Stand Sit to Stand: Min assist         General transfer comment: min A for power  up, able to push off from bed today with cues for hand placement  Ambulation/Gait Ambulation/Gait assistance: Min assist Gait Distance (Feet): 10 Feet Assistive device: Rolling walker (2 wheeled) Gait Pattern/deviations: Step-to pattern;Decreased stride length;Shuffle;Trunk flexed;Narrow base of support Gait velocity: decr Gait velocity interpretation: <1.31 ft/sec, indicative of household ambulator General Gait Details: min A for steadying with ambulation from bed to door with RN in visual field as target, pt with slight knee buckling at end as recliner brough up behind him          Balance Overall balance assessment: Needs assistance;History of Falls Sitting-balance support: Bilateral upper extremity supported;Feet supported Sitting balance-Leahy Scale: Poor Sitting balance - Comments: UE supported   Standing balance support: Bilateral upper extremity supported Standing balance-Leahy Scale: Poor Standing balance comment: reliant on BUE support                            Cognition Arousal/Alertness: Awake/alert Behavior During Therapy: WFL for tasks assessed/performed Overall Cognitive Status: History of cognitive impairments - at baseline                                 General Comments: requires increased multimodal cuing for mobility, especially due to decreased vision,         General Comments General comments (  skin integrity, edema, etc.): pt had urinated for first time since Foley removed, VSS on RA      Pertinent Vitals/Pain Pain Assessment: Faces Faces Pain Scale: Hurts a little bit Pain Location: R thigh with movement Pain Descriptors / Indicators: Grimacing;Moaning Pain Intervention(s): Limited activity within patient's tolerance;Monitored during session;Repositioned           PT Goals (current goals can now be found in the care plan section) Acute Rehab PT Goals Patient Stated Goal: to get stronger PT Goal Formulation: With  patient/family Time For Goal Achievement: 03/06/20 Potential to Achieve Goals: Fair Progress towards PT goals: Progressing toward goals    Frequency    Min 3X/week      PT Plan Current plan remains appropriate       AM-PAC PT "6 Clicks" Mobility   Outcome Measure  Help needed turning from your back to your side while in a flat bed without using bedrails?: A Little Help needed moving from lying on your back to sitting on the side of a flat bed without using bedrails?: A Little Help needed moving to and from a bed to a chair (including a wheelchair)?: A Lot Help needed standing up from a chair using your arms (e.g., wheelchair or bedside chair)?: A Little Help needed to walk in hospital room?: A Little Help needed climbing 3-5 steps with a railing? : A Lot 6 Click Score: 16    End of Session Equipment Utilized During Treatment: Gait belt Activity Tolerance: Patient tolerated treatment well Patient left: with family/visitor present;in chair;with chair alarm set;with call bell/phone within reach Nurse Communication: Mobility status PT Visit Diagnosis: Difficulty in walking, not elsewhere classified (R26.2);History of falling (Z91.81)     Time: 8502-7741 PT Time Calculation (min) (ACUTE ONLY): 21 min  Charges:  $Gait Training: 8-22 mins                     Alva Kuenzel B. Migdalia Dk PT, DPT Acute Rehabilitation Services Pager [phone] Office [phone]    [place] 02/24/2020, 3:58 PM

## 2020-02-24 NOTE — Progress Notes (Signed)
Cardiology/Structural Heart Team Note:  I met with Mr. Broyles and his daughter at the bedside this morning. He is feeling better since admission. We discussed his aortic stenosis and overall poor functional status with stage 4 kidney disease. I reviewed his case with our structural heart team at our meeting today. After careful consideration of his multiple co-morbidities including advanced age, CKD stage 4, poor functional status and severe AS he is not felt to be a candidate for TAVR.   We will plan to arrange cardiac follow with Dr. Sallyanne Kuster after his current hospitalization and will attempt medical management with careful diuresis. I would suspect that he will need to be discharged on Lasix, possibly the 40 mg three times weekly dosage as before. It would be reasonable to consider a palliative care consultation at this time.   The structural heart team will assist as needed. Please call our general cardiology team if there are any cardiac questions during the remainder of the hospitalization.   Lauree Chandler 02/24/2020 11:06 AM

## 2020-02-24 NOTE — Progress Notes (Signed)
PROGRESS NOTE    Alexander Mcdonald  WPY:[id number] DOB: [birth year] DOA: 03/13/2020 PCP: Isaac Bliss, Rayford Halsted, MD   Brief Narrative: Alexander Mcdonald is a 87 y.o.  male with medical history significant of history of HTN, bladder spasm, BPH, bradycardia, history of PVCs, carotid artery disease, gout, glaucoma, thrombosed hemorrhoid, hyperlipidemia, hypertension, prostate cancer, rosacea, unsteadiness. Patient presented secondary to an elevated creatinine and found to have an AKI in addition to a LLL pneumonia. Nephrology consulted. IV antibiotics initiated.   Assessment & Plan:   Principal Problem:   Acute renal failure superimposed on stage 4 chronic kidney disease, unspecified acute renal failure type (HCC) Active Problems:   HYPERCHOLESTEROLEMIA   Essential hypertension   CAP (community acquired pneumonia)   Hyponatremia   Hypothyroidism   Aortic stenosis   Glaucoma   AKI on CKD stage IV Baseline creatinine of about 2.2-2.4. Creatinine of 4.43 on admission. In setting of recent heart catheterization. Given IV fluids on admission. No hydronephrosis on CT abdomen/pelvis. Improving slowly -Continue IV fluids -Daily CMP  Community acquired pneumonia LLL pneumonia seen on CT imaging. Patient underwent thoracentesis on 2/5 yielding 400 mL of clear yellow fluid; fluid analysis suggest probable transudate but difficult to be exact secondary to undetectable protein in pleural fluid. Strep/legionella negative. No sputum culture available. Pleural fluid culture (2/5) with no growth to date. Cytology with no evidence of malignancy. -Continue Ceftriaxone/Azithromycin -Follow-up body fluid culture/cytology  Acute respiratory failure with hypoxia Likely secondary to pneumonia. SpO2 as low as 87% on room air on admission. Initially placed on 2 lpm of oxygen and weaned to room air. Overnight on 2/6, patient developed some dyspnea concern for fluid overload. BNP significantly elevated. Given  Lasix with improvement. -Continue oxygen and wean to room air as able -Keep SpO2 > 92%  Acute urinary retention Unknown etiology. Foley catheter placed on 2/4. Urinalysis does not suggest infection, but does show pyuria. Urine cultures (2/4 and 2/5) with multiple species. On antibiotics to cover CAP. -Remove catheter and attempt voiding trial today  Chronic diastolic heart failure -Resume home lasix now that creatinine has returned to baseline  Bradycardia Tachycardia Bradycardia resolved after discontinuing labetalol. Now with tachycardia -Continue Telemetry -Continue home labetalol at 50 mg BID (half dose)  Elevated AST/ALT/ALP Unknown etiology. CT abdomen/pelvis and RUQ ultrasound without any biliary pathology noted. No associated clinical symptoms. Normal bilirubin -Trend CMP  Hyponatremia Mild. Improved with IV fluids. Resolved.  Hyperlipidemia -Continue Lipitor  Aortic stenosis Patient with severe disease with plan for consideration of TAVR as an outpatient.  Constipation Improved with dulcolax suppository -Continue Miralax BID  Primary hypertension -Continue hydralazine, labetalol  Hypothyroidism -Continue Synthroid  Glaucoma -Continue apraclonidine, latanoprost   DVT prophylaxis: Heparin subq Code Status:   Code Status: Full Code Family Communication: Daughter on telephone Disposition Plan: Discharge home likely in 1-2 days pending successful voiding trial, improvement of WBC, improvement/stability of LFTs, improved mobility.   Consultants:   None  Procedures:   None  Antimicrobials:  Vancomycin  Ceftriaxone  Azithromycin   Subjective: Thirsty. No other concerns.  Objective: Vitals:   02/23/20 2106 02/23/20 2348 02/24/20 0348 02/24/20 0806  BP: (!) 122/52 119/70 (!) 140/34 (!) 144/47  Pulse:  68 81 (!) 106  Resp:  15 17 20   Temp:  97.9 F (36.6 C) 97.9 F (36.6 C) 98 F (36.7 C)  TempSrc:  Oral Oral Oral  SpO2:  98% 96% 96%     Intake/Output Summary (Last 24 hours) at  02/24/2020 0843 Last data filed at 02/24/2020 0540 Gross per 24 hour  Intake 237 ml  Output 1100 ml  Net -863 ml   There were no vitals filed for this visit.  Examination:  General exam: Appears calm and comfortable. Frail appearing. Respiratory system: Clear to auscultation. Respiratory effort normal. Cardiovascular system: S1 & S2 heard, RRR. No murmurs, rubs, gallops or clicks. Gastrointestinal system: Abdomen is nondistended, soft and nontender. No organomegaly or masses felt. Normal bowel sounds heard. Central nervous system: Alert and oriented. No focal neurological deficits. Musculoskeletal: No edema. No calf tenderness Skin: No cyanosis. No rashes   Data Reviewed: I have personally reviewed following labs and imaging studies  CBC Lab Results  Component Value Date   WBC 19.0 (H) 02/24/2020   RBC 2.60 (L) 02/24/2020   HGB 7.9 (L) 02/24/2020   HCT 23.9 (L) 02/24/2020   MCV 91.9 02/24/2020   MCH 30.4 02/24/2020   PLT 290 02/24/2020   MCHC 33.1 02/24/2020   RDW 14.8 02/24/2020   LYMPHSABS 1.2 02/22/2020   MONOABS 0.9 02/22/2020   EOSABS 0.0 02/22/2020   BASOSABS 0.0 91/47/8295     Last metabolic panel Lab Results  Component Value Date   NA 145 02/24/2020   K 3.6 02/24/2020   CL 110 02/24/2020   CO2 24 02/24/2020   BUN 71 (H) 02/24/2020   CREATININE 2.43 (H) 02/24/2020   GLUCOSE 204 (H) 02/24/2020   GFRNONAA 24 (L) 02/24/2020   GFRAA 16 (L) 02/18/2020   CALCIUM 9.6 02/24/2020   PHOS 3.4 07/11/2017   PROT 5.0 (L) 02/24/2020   ALBUMIN 2.2 (L) 02/24/2020   BILITOT 0.9 02/24/2020   ALKPHOS 237 (H) 02/24/2020   AST 175 (H) 02/24/2020   ALT 181 (H) 02/24/2020   ANIONGAP 11 02/24/2020    CBG (last 3)  No results for input(s): GLUCAP in the last 72 hours.   GFR: Estimated Creatinine Clearance: 17.2 mL/min (A) (by C-G formula based on SCr of 2.43 mg/dL (H)).  Coagulation Profile: Recent Labs  Lab  02/22/2020 1507  INR 1.2    Recent Results (from the past 240 hour(s))  Blood culture (routine single)     Status: None (Preliminary result)   Collection Time: 03/12/2020  1:56 PM   Specimen: BLOOD LEFT HAND  Result Value Ref Range Status   Specimen Description BLOOD LEFT HAND  Final   Special Requests   Final    BOTTLES DRAWN AEROBIC ONLY Blood Culture results may not be optimal due to an inadequate volume of blood received in culture bottles   Culture   Final    NO GROWTH 4 DAYS Performed at [facility], [place] [address], [place], [place] [address]    Report Status PENDING  Incomplete  SARS Coronavirus 2 by RT PCR (hospital order, performed in [facility] hospital lab) Nasopharyngeal Nasopharyngeal Swab     Status: None   Collection Time: 03/12/2020  2:41 PM   Specimen: Nasopharyngeal Swab  Result Value Ref Range Status   SARS Coronavirus 2 NEGATIVE NEGATIVE Final    Comment: (NOTE) SARS-CoV-2 target nucleic acids are NOT DETECTED.  The SARS-CoV-2 RNA is generally detectable in upper and lower respiratory specimens during the acute phase of infection. The lowest concentration of SARS-CoV-2 viral copies this assay can detect is 250 copies / mL. A negative result does not preclude SARS-CoV-2 infection and should not be used as the sole basis for treatment or other patient management decisions.  A negative result  may occur with improper specimen collection / handling, submission of specimen other than nasopharyngeal swab, presence of viral mutation(s) within the areas targeted by this assay, and inadequate number of viral copies (<250 copies / mL). A negative result must be combined with clinical observations, patient history, and epidemiological information.  Fact Sheet for Patients:   StrictlyIdeas.no  Fact Sheet for Healthcare Providers: BankingDealers.co.za  This test is not yet approved or  cleared by the [place] FDA  and has been authorized for detection and/or diagnosis of SARS-CoV-2 by FDA under an Emergency Use Authorization (EUA).  This EUA will remain in effect (meaning this test can be used) for the duration of the COVID-19 declaration under Section 564(b)(1) of the Act, 21 U.S.C. section 360bbb-3(b)(1), unless the authorization is terminated or revoked sooner.  Performed at [facility], [place] [address], [place], [place] [address]   MRSA PCR Screening     Status: None   Collection Time: 03/13/2020  7:52 PM   Specimen: Nasopharyngeal  Result Value Ref Range Status   MRSA by PCR NEGATIVE NEGATIVE Final    Comment:        The GeneXpert MRSA Assay (FDA approved for NASAL specimens only), is one component of a comprehensive MRSA colonization surveillance program. It is not intended to diagnose MRSA infection nor to guide or monitor treatment for MRSA infections. Performed at [facility], [place] [address] [place], [place] [address]   Urine culture     Status: Abnormal   Collection Time: 02/20/20 11:42 AM   Specimen: In/Out Cath Urine  Result Value Ref Range Status   Specimen Description IN/OUT CATH URINE  Final   Special Requests   Final    NONE Performed at [facility], [place] [address] [place], [place] [address]    Culture MULTIPLE SPECIES PRESENT, SUGGEST RECOLLECTION (A)  Final   Report Status 02/21/2020 FINAL  Final  Gram stain     Status: None   Collection Time: 02/21/20 11:14 AM   Specimen: PATH Cytology Pleural fluid  Result Value Ref Range Status   Specimen Description PLEURAL FLUID LEFT  Final   Special Requests NONE  Final   Gram Stain   Final    ABUNDANT WBC PRESENT,BOTH PMN AND MONONUCLEAR NO ORGANISMS SEEN Performed at [facility], [place] [address], [place], [place] [address]    Report Status 02/21/2020 FINAL  Final  Culture, body fluid-bottle     Status: None (Preliminary result)   Collection Time: 02/21/20 11:14 AM   Specimen: Pleura   Result Value Ref Range Status   Specimen Description PLEURAL FLUID LEFT  Final   Special Requests NONE  Final   Culture   Final    NO GROWTH 2 DAYS Performed at [place] [address], [place], [place] [address]    Report Status PENDING  Incomplete  Culture, Urine     Status: Abnormal   Collection Time: 02/21/20  7:52 PM   Specimen: Urine, Catheterized  Result Value Ref Range Status   Specimen Description URINE, CATHETERIZED  Final   Special Requests   Final    STERILE CUP Performed at [facility], [place] [address], [place], [place] [address]    Culture MULTIPLE SPECIES PRESENT, SUGGEST RECOLLECTION (A)  Final   Report Status 02/23/2020 FINAL  Final        Radiology Studies: DG CHEST PORT 1 VIEW  Result Date: 02/23/2020 CLINICAL DATA:  Weakness.  EXAM: PORTABLE CHEST 1 VIEW COMPARISON:  February 21, 2020. FINDINGS: Stable cardiomediastinal silhouette. No pneumothorax is noted. Increased bibasilar opacities is noted concerning for subsegmental atelectasis. Bony thorax is unremarkable. IMPRESSION: Increased bibasilar opacities are noted concerning for subsegmental atelectasis. Aortic Atherosclerosis (ICD10-I70.0). Electronically Signed   By: Marijo Conception M.D.   On: 02/23/2020 08:03   US Abdomen Limited RUQ (LIVER/GB)  Result Date: 02/23/2020 CLINICAL DATA:  Elevated LFTs EXAM: ULTRASOUND ABDOMEN LIMITED RIGHT UPPER QUADRANT COMPARISON:  None. FINDINGS: Gallbladder: No gallstones or wall thickening visualized. No sonographic Murphy sign noted by sonographer. Common bile duct: Diameter: Normal caliber, 3 mm Liver: No focal abnormality. Normal echotexture. No biliary ductal dilatation. Portal vein is patent on color Doppler imaging with normal direction of blood flow towards the liver. Other: Incidentally noted is right effusion. IMPRESSION: No acute findings in the right upper abdomen. Right pleural effusion. Electronically Signed   By: Rolm Baptise M.D.   On: 02/23/2020  19:12        Scheduled Meds: . apraclonidine  1 drop Left Eye TID  . vitamin C  1,000 mg Oral Daily  . aspirin EC  81 mg Oral Daily  . atorvastatin  10 mg Oral Daily  . bisacodyl  10 mg Rectal Once  . Chlorhexidine Gluconate Cloth  6 each Topical Daily  . heparin  5,000 Units Subcutaneous Q8H  . hydrALAZINE  50 mg Oral BID  . labetalol  50 mg Oral BID  . latanoprost  1 drop Left Eye QHS  . levothyroxine  50 mcg Oral QAC breakfast  . loratadine  10 mg Oral Daily  . magnesium oxide  200 mg Oral Daily  . melatonin  10 mg Oral QHS  . polyethylene glycol  17 g Oral BID  . tamsulosin  0.4 mg Oral QPC supper   Continuous Infusions:    LOS: 4 days     Cordelia Poche, MD [facility] 02/24/2020, 8:43 AM  If 7PM-7AM, please contact night-coverage [email or web address]

## 2020-02-25 DIAGNOSIS — N179 Acute kidney failure, unspecified: Secondary | ICD-10-CM | POA: Diagnosis not present

## 2020-02-25 DIAGNOSIS — N184 Chronic kidney disease, stage 4 (severe): Secondary | ICD-10-CM | POA: Diagnosis not present

## 2020-02-25 LAB — COMPREHENSIVE METABOLIC PANEL
ALT: 339 U/L — ABNORMAL HIGH (ref 0–44)
AST: 400 U/L — ABNORMAL HIGH (ref 15–41)
Albumin: 2.3 g/dL — ABNORMAL LOW (ref 3.5–5.0)
Alkaline Phosphatase: 308 U/L — ABNORMAL HIGH (ref 38–126)
Anion gap: 12 (ref 5–15)
BUN: 66 mg/dL — ABNORMAL HIGH (ref 8–23)
CO2: 24 mmol/L (ref 22–32)
Calcium: 10 mg/dL (ref 8.9–10.3)
Chloride: 107 mmol/L (ref 98–111)
Creatinine, Ser: 2.21 mg/dL — ABNORMAL HIGH (ref 0.61–1.24)
GFR, Estimated: 27 mL/min — ABNORMAL LOW (ref >60)
Glucose, Bld: 123 mg/dL — ABNORMAL HIGH (ref 70–99)
Potassium: 3.8 mmol/L (ref 3.5–5.1)
Sodium: 143 mmol/L (ref 135–145)
Total Bilirubin: 0.7 mg/dL (ref 0.3–1.2)
Total Protein: 4.7 g/dL — ABNORMAL LOW (ref 6.5–8.1)

## 2020-02-25 LAB — CBC
HCT: 24.6 % — ABNORMAL LOW (ref 39.0–52.0)
Hemoglobin: 8 g/dL — ABNORMAL LOW (ref 13.0–17.0)
MCH: 30.4 pg (ref 26.0–34.0)
MCHC: 32.5 g/dL (ref 30.0–36.0)
MCV: 93.5 fL (ref 80.0–100.0)
Platelets: 288 10*3/uL (ref 150–400)
RBC: 2.63 MIL/uL — ABNORMAL LOW (ref 4.22–5.81)
RDW: 15.1 % (ref 11.5–15.5)
WBC: 18.2 10*3/uL — ABNORMAL HIGH (ref 4.0–10.5)
nRBC: 1 % — ABNORMAL HIGH (ref 0.0–0.2)

## 2020-02-25 MED ORDER — ENSURE ENLIVE PO LIQD
237.0000 mL | Freq: Two times a day (BID) | ORAL | Status: DC
  Administered 2020-02-25 – 2020-02-26 (×3): 237 mL via ORAL

## 2020-02-25 MED ORDER — MIRTAZAPINE 15 MG PO TABS
15.0000 mg | ORAL_TABLET | Freq: Every day | ORAL | Status: DC
  Administered 2020-02-25 – 2020-02-26 (×2): 15 mg via ORAL
  Filled 2020-02-25: qty 2
  Filled 2020-02-25: qty 1

## 2020-02-25 NOTE — Progress Notes (Signed)
Occupational Therapy Treatment Patient Details Name: Alexander Mcdonald MRN: [id number] DOB: [birth year] Today's Date: 02/25/2020    History of present illness The pt is a 87 yo male presenting from home with abnormal labs (WBC 23.8, Creatinine 3.56, BUN 83). Upon workup, pt found to have AKI on CKD IV, CAP. PMH includes: aortic stenosis with TAVR pending, glaucoma (legally blind, some sight at midline in L eye), HTN, CAD, HLD, postate cancer, and "unsteadiness"   OT comments  Pt progressing towards OT goals. Initiated session with bed level exercises to "warm up" Pt prior to transfer to recliner at min A. Then patient participating in grooming tasks requiring mod A due to visual deficits (baseline). Pt continues to benefit from skilled OT in the acute setting.    Follow Up Recommendations  Home health OT;Supervision/Assistance - 24 hour    Equipment Recommendations  None recommended by OT    Recommendations for Other Services      Precautions / Restrictions Precautions Precautions: Fall Precaution Comments: per daughter, has slid to floor during stand-sit multiple times in last year, legally blind but some vision at midline in L eye. Restrictions Weight Bearing Restrictions: No       Mobility Bed Mobility Overal bed mobility: Needs Assistance Bed Mobility: Supine to Sit     Supine to sit: Min assist     General bed mobility comments: min A for managment of LE across bed, some grimace with movement of R LE, min A for hand hold assist for pt to pull up to side of bed  Transfers Overall transfer level: Needs assistance Equipment used: Rolling walker (2 wheeled) Transfers: Sit to/from Stand Sit to Stand: Min assist         General transfer comment: min A for power up, able to push off from bed today with cues for hand placement    Balance Overall balance assessment: Needs assistance;History of Falls Sitting-balance support: Bilateral upper extremity supported;Feet  supported Sitting balance-Leahy Scale: Poor Sitting balance - Comments: UE supported   Standing balance support: Bilateral upper extremity supported Standing balance-Leahy Scale: Poor Standing balance comment: reliant on BUE support                           ADL either performed or assessed with clinical judgement   ADL Overall ADL's : Needs assistance/impaired     Grooming: Wash/dry hands;Brushing hair;Minimal assistance;Sitting Grooming Details (indicate cue type and reason): in recliner, increased assist due to visual deficits                 Toilet Transfer: Minimal assistance;Stand-pivot;RW;BSC           Functional mobility during ADLs: Minimal assistance;Rolling walker       Vision       Perception     Praxis      Cognition Arousal/Alertness: Awake/alert Behavior During Therapy: WFL for tasks assessed/performed Overall Cognitive Status: History of cognitive impairments - at baseline                                 General Comments: requires increased multimodal cuing for mobility, especially due to decreased vision,        Exercises     Shoulder Instructions       General Comments VSS on RA throughout session    Pertinent Vitals/ Pain       Pain Assessment: Faces  Faces Pain Scale: Hurts a little bit Pain Location: R hip Pain Descriptors / Indicators: Grimacing;Moaning Pain Intervention(s): Limited activity within patient's tolerance;Monitored during session;Repositioned  Home Living                                          Prior Functioning/Environment              Frequency  Min 2X/week        Progress Toward Goals  OT Goals(current goals can now be found in the care plan section)  Progress towards OT goals: Progressing toward goals  Acute Rehab OT Goals Patient Stated Goal: to get stronger OT Goal Formulation: With patient Time For Goal Achievement: 03/07/20 Potential to  Achieve Goals: Good  Plan Discharge plan remains appropriate;Frequency remains appropriate    Co-evaluation                 AM-PAC OT "6 Clicks" Daily Activity     Outcome Measure   Help from another person eating meals?: A Little Help from another person taking care of personal grooming?: A Little Help from another person toileting, which includes using toliet, bedpan, or urinal?: Total Help from another person bathing (including washing, rinsing, drying)?: A Lot Help from another person to put on and taking off regular upper body clothing?: A Lot Help from another person to put on and taking off regular lower body clothing?: A Lot 6 Click Score: 13    End of Session Equipment Utilized During Treatment: Rolling walker  OT Visit Diagnosis: Unsteadiness on feet (R26.81);Other abnormalities of gait and mobility (R26.89);Muscle weakness (generalized) (M62.81);Other symptoms and signs involving cognitive function;Low vision, both eyes (H54.2)   Activity Tolerance Patient tolerated treatment well   Patient Left in chair;with call bell/phone within reach;with chair alarm set   Nurse Communication Mobility status        Time: 6789-3810 OT Time Calculation (min): 31 min  Charges: OT General Charges $OT Visit: 1 Visit OT Treatments $Self Care/Home Management : 8-22 mins $Therapeutic Activity: 8-22 mins  Jesse Sans OTR/L Acute Rehabilitation Services Pager: [phone] Office: [place] 02/25/2020, 4:24 PM

## 2020-02-25 NOTE — Progress Notes (Signed)
Progress Note    [name]  SEG:[id number] DOB: 1926-12-27  DOA: 02/18/2020 PCP: Isaac Bliss, Rayford Halsted, MD    Brief Narrative:     Medical records reviewed and are as summarized below:  Alexander Mcdonald is an 87 y.o. male with medical history significant ofhistory of HTN, bladder spasm, BPH, bradycardia, history of PVCs, carotid artery disease, gout, glaucoma, thrombosed hemorrhoid, hyperlipidemia, hypertension, prostate cancer, rosacea, unsteadiness. Patient presented secondary to an elevated creatinine and found to have an AKI in addition to a LLL pneumonia. Nephrology consulted. IV antibiotics initiated. Not improving over last 6 days.  Per nursing, not eating much.    Assessment/Plan:   Principal Problem:   Acute renal failure superimposed on stage 4 chronic kidney disease, unspecified acute renal failure type ([place]) Active Problems:   HYPERCHOLESTEROLEMIA   Essential hypertension   CAP (community acquired pneumonia)   Hyponatremia   Hypothyroidism   Aortic stenosis   Glaucoma   AKI on CKD stage IV Baseline creatinine of about 2.2-2.4. Creatinine of 4.43 on admission. In setting of recent heart catheterization. Given IV fluids on admission. No hydronephrosis on CT abdomen/pelvis. Improving slowly -IVF stopped on 2/6 -Daily CMP  Community acquired pneumonia LLL pneumonia seen on CT imaging. Patient underwent thoracentesis on 2/5 yielding 400 mL of clear yellow fluid; fluid analysis suggest probable transudate but difficult to be exact secondary to undetectable protein in pleural fluid. Strep/legionella negative. No sputum culture available. Pleural fluid culture (2/5) with no growth to date. Cytology with no evidence of malignancy. -Continue Ceftriaxone/Azithromycin  Acute respiratory failure with hypoxia Likely secondary to pneumonia. SpO2 as low as 87% on room air on admission. Initially placed on 2 lpm of oxygen and weaned to room air. Overnight on 2/6,  patient developed some dyspnea concern for fluid overload. BNP significantly elevated. Given Lasix with improvement. -Continue oxygen and wean to room air as able -Keep SpO2 > 92% -incentive spirometry  Acute urinary retention Unknown etiology. Foley catheter placed on 2/4. Urinalysis does not suggest infection, but does show pyuria. Urine cultures (2/4 and 2/5) with multiple species. On antibiotics to cover CAP. -foley d/c'd  Chronic diastolic heart failure -Resume home lasix   Bradycardia/Tachycardia Bradycardia resolved after discontinuing labetalol then developed tachycardia -Continue Telemetry -Continue home labetalol at 50 mg BID (half dose)  Elevated AST/ALT/ALP Unknown etiology. CT abdomen/pelvis and RUQ ultrasound without any biliary pathology noted. No associated clinical symptoms. Normal bilirubin -d/c statin as trending up  Hyponatremia Mild. Improved with IV fluids. Resolved.  Hyperlipidemia -Continue Lipitor  Aortic stenosis -seen by cards on 2/8: After careful consideration of his multiple co-morbidities including advanced age, CKD stage 4, poor functional status and severe AS he is not felt to be a candidate for TAVR. We will plan to arrange cardiac follow with Dr. Sallyanne Kuster after his current hospitalization and will attempt medical management with careful diuresis. I would suspect that he will need to be discharged on Lasix, possibly the 40 mg three times weekly dosage as before. It would be reasonable to consider a palliative care consultation at this time.   Constipation Improved with dulcolax suppository -Continue Miralax BID  Primary hypertension -Continue hydralazine, labetalol  Hypothyroidism -Continue Synthroid  Glaucoma -Continue apraclonidine, latanoprost   Spoke with daughter regarding his poor PO intake-- will get SLP and start remeron, calorie count Delirium precautions Consider palliative care consult in AM  Family  Communication/Anticipated D/C date and plan/Code Status   DVT prophylaxis: heparin Code Status: Full  Code.  Family Communication: called daughter Disposition Plan: Status is: Inpatient  Remains inpatient appropriate because:Inpatient level of care appropriate due to severity of illness   Dispo: The patient is from: Home              Anticipated d/c is to: Home              Anticipated d/c date is: 2 days              Patient currently is not medically stable to d/c.   Difficult to place patient No         Medical Consultants:   Cards renal    Subjective:   Did not sleep last night  Objective:    Vitals:   02/24/20 2113 02/24/20 2333 02/25/20 0323 02/25/20 0800  BP: 123/65 (!) 111/49 124/63 128/72  Pulse: 91 62 90 (!) 45  Resp:  17 18 15   Temp:  97.9 F (36.6 C) 98.2 F (36.8 C) (!) 97.5 F (36.4 C)  TempSrc:  Oral Oral Oral  SpO2:  96% 97% 98%    Intake/Output Summary (Last 24 hours) at 02/25/2020 1325 Last data filed at 02/25/2020 1155 Gross per 24 hour  Intake 597 ml  Output 300 ml  Net 297 ml   There were no vitals filed for this visit.  Exam:  General: Appearance:    Cachetic, chronically ill appearing male in no acute distress     Lungs:     Poor effort, respirations unlabored  Heart:    Bradycardic. Normal rhythm.  + murmur  MS:   All extremities are intact.   Neurologic:   Awake, alert, weak    Data Reviewed:   I have personally reviewed following labs and imaging studies:  Labs: Labs show the following:   Basic Metabolic Panel: Recent Labs  Lab 02/21/20 0045 02/22/20 0150 02/23/20 0029 02/24/20 0052 02/25/20 0027  NA 135 138 140 145 143  K 4.0 3.4* 3.7 3.6 3.8  CL 102 108 111 110 107  CO2 18* 18* 17* 24 24  GLUCOSE 120* 145* 198* 204* 123*  BUN 107* 86* 70* 71* 66*  CREATININE 4.04* 3.07* 2.61* 2.43* 2.21*  CALCIUM 8.7* 9.2 8.8* 9.6 10.0  MG  --  2.6*  --   --   --    GFR Estimated Creatinine Clearance: 18.9 mL/min (A)  (by C-G formula based on SCr of 2.21 mg/dL (H)). Liver Function Tests: Recent Labs  Lab 02/21/20 0045 02/22/20 0150 02/23/20 0029 02/24/20 0052 02/25/20 0027  AST 64* 74* 148* 175* 400*  ALT 98* 105* 127* 181* 339*  ALKPHOS 220* 216* 231* 237* 308*  BILITOT 1.2 1.0 0.9 0.9 0.7  PROT 5.1* 5.2* 5.1* 5.0* 4.7*  ALBUMIN 2.3* 2.2* 2.2* 2.2* 2.3*   No results for input(s): LIPASE, AMYLASE in the last 168 hours. No results for input(s): AMMONIA in the last 168 hours. Coagulation profile Recent Labs  Lab 03/05/2020 1507  INR 1.2    CBC: Recent Labs  Lab 02/20/20 0353 02/21/20 0045 02/22/20 0150 02/23/20 0029 02/24/20 0052 02/25/20 0027  WBC 18.3* 19.0* 18.9* 20.2* 19.0* 18.2*  NEUTROABS 16.1* 16.7* 15.9*  --   --   --   HGB 9.9* 9.7* 9.1* 8.2* 7.9* 8.0*  HCT 31.0* 29.5* 26.5* 25.9* 23.9* 24.6*  MCV 92.5 90.8 89.2 92.5 91.9 93.5  PLT 254 292 302 307 290 288   Cardiac Enzymes: No results for input(s): CKTOTAL, CKMB, CKMBINDEX, TROPONINI in the  last 168 hours. BNP (last 3 results) No results for input(s): PROBNP in the last 8760 hours. CBG: No results for input(s): GLUCAP in the last 168 hours. D-Dimer: No results for input(s): DDIMER in the last 72 hours. Hgb A1c: No results for input(s): HGBA1C in the last 72 hours. Lipid Profile: No results for input(s): CHOL, HDL, LDLCALC, TRIG, CHOLHDL, LDLDIRECT in the last 72 hours. Thyroid function studies: No results for input(s): TSH, T4TOTAL, T3FREE, THYROIDAB in the last 72 hours.  Invalid input(s): FREET3 Anemia work up: No results for input(s): VITAMINB12, FOLATE, FERRITIN, TIBC, IRON, RETICCTPCT in the last 72 hours. Sepsis Labs: Recent Labs  Lab 03/11/2020 1356 03/03/2020 1814 02/20/20 0353 02/22/20 0150 02/23/20 0029 02/24/20 0052 02/25/20 0027  WBC  --   --    < > 18.9* 20.2* 19.0* 18.2*  LATICACIDVEN 1.7 1.0  --   --   --   --   --    < > = values in this interval not displayed.    Microbiology Recent  Results (from the past 240 hour(s))  Blood culture (routine single)     Status: None   Collection Time: 03/05/2020  1:56 PM   Specimen: BLOOD LEFT HAND  Result Value Ref Range Status   Specimen Description BLOOD LEFT HAND  Final   Special Requests   Final    BOTTLES DRAWN AEROBIC ONLY Blood Culture results may not be optimal due to an inadequate volume of blood received in culture bottles   Culture   Final    NO GROWTH 5 DAYS Performed at [facility], [place] [address] [place], [place] [address]    Report Status 02/24/2020 FINAL  Final  SARS Coronavirus 2 by RT PCR (hospital order, performed in [facility] hospital lab) Nasopharyngeal Nasopharyngeal Swab     Status: None   Collection Time: 02/21/2020  2:41 PM   Specimen: Nasopharyngeal Swab  Result Value Ref Range Status   SARS Coronavirus 2 NEGATIVE NEGATIVE Final    Comment: (NOTE) SARS-CoV-2 target nucleic acids are NOT DETECTED.  The SARS-CoV-2 RNA is generally detectable in upper and lower respiratory specimens during the acute phase of infection. The lowest concentration of SARS-CoV-2 viral copies this assay can detect is 250 copies / mL. A negative result does not preclude SARS-CoV-2 infection and should not be used as the sole basis for treatment or other patient management decisions.  A negative result may occur with improper specimen collection / handling, submission of specimen other than nasopharyngeal swab, presence of viral mutation(s) within the areas targeted by this assay, and inadequate number of viral copies (<250 copies / mL). A negative result must be combined with clinical observations, patient history, and epidemiological information.  Fact Sheet for Patients:   StrictlyIdeas.no  Fact Sheet for Healthcare Providers: BankingDealers.co.za  This test is not yet approved or  cleared by the [place] FDA and has been authorized for detection and/or diagnosis  of SARS-CoV-2 by FDA under an Emergency Use Authorization (EUA).  This EUA will remain in effect (meaning this test can be used) for the duration of the COVID-19 declaration under Section 564(b)(1) of the Act, 21 U.S.C. section 360bbb-3(b)(1), unless the authorization is terminated or revoked sooner.  Performed at [facility], [place] [address], [place], [place] [address]   MRSA PCR Screening     Status: None   Collection Time: 02/18/2020  7:52 PM   Specimen: Nasopharyngeal  Result Value Ref Range Status   MRSA  by PCR NEGATIVE NEGATIVE Final    Comment:        The GeneXpert MRSA Assay (FDA approved for NASAL specimens only), is one component of a comprehensive MRSA colonization surveillance program. It is not intended to diagnose MRSA infection nor to guide or monitor treatment for MRSA infections. Performed at [facility], [place] [address] [place], [place] [address]   Urine culture     Status: Abnormal   Collection Time: 02/20/20 11:42 AM   Specimen: In/Out Cath Urine  Result Value Ref Range Status   Specimen Description IN/OUT CATH URINE  Final   Special Requests   Final    NONE Performed at [facility], [place] [address], [place], [place] [address]    Culture MULTIPLE SPECIES PRESENT, SUGGEST RECOLLECTION (A)  Final   Report Status 02/21/2020 FINAL  Final  Gram stain     Status: None   Collection Time: 02/21/20 11:14 AM   Specimen: PATH Cytology Pleural fluid  Result Value Ref Range Status   Specimen Description PLEURAL FLUID LEFT  Final   Special Requests NONE  Final   Gram Stain   Final    ABUNDANT WBC PRESENT,BOTH PMN AND MONONUCLEAR NO ORGANISMS SEEN Performed at [facility], [place] [address], [place], [place] [address]    Report Status 02/21/2020 FINAL  Final  Culture, body fluid-bottle     Status: None (Preliminary result)   Collection Time: 02/21/20 11:14 AM   Specimen: Pleura  Result Value Ref Range Status   Specimen Description  PLEURAL FLUID LEFT  Final   Special Requests NONE  Final   Culture   Final    NO GROWTH 4 DAYS Performed at [place] [address] [place], [place] [address]    Report Status PENDING  Incomplete  Culture, Urine     Status: Abnormal   Collection Time: 02/21/20  7:52 PM   Specimen: Urine, Catheterized  Result Value Ref Range Status   Specimen Description URINE, CATHETERIZED  Final   Special Requests   Final    STERILE CUP Performed at [facility], [place] [address], [place], [place] [address]    Culture MULTIPLE SPECIES PRESENT, SUGGEST RECOLLECTION (A)  Final   Report Status 02/23/2020 FINAL  Final    Procedures and diagnostic studies:  US Abdomen Limited RUQ (LIVER/GB)  Result Date: 02/23/2020 CLINICAL DATA:  Elevated LFTs EXAM: ULTRASOUND ABDOMEN LIMITED RIGHT UPPER QUADRANT COMPARISON:  None. FINDINGS: Gallbladder: No gallstones or wall thickening visualized. No sonographic Murphy sign noted by sonographer. Common bile duct: Diameter: Normal caliber, 3 mm Liver: No focal abnormality. Normal echotexture. No biliary ductal dilatation. Portal vein is patent on color Doppler imaging with normal direction of blood flow towards the liver. Other: Incidentally noted is right effusion. IMPRESSION: No acute findings in the right upper abdomen. Right pleural effusion. Electronically Signed   By: Rolm Baptise M.D.   On: 02/23/2020 19:12    Medications:   . apraclonidine  1 drop Left Eye TID  . vitamin C  1,000 mg Oral Daily  . aspirin EC  81 mg Oral Daily  . bisacodyl  10 mg Rectal Once  . feeding supplement  237 mL Oral BID BM  . heparin  5,000 Units Subcutaneous Q8H  . hydrALAZINE  50 mg Oral BID  . labetalol  50 mg Oral BID  . latanoprost  1 drop Left Eye QHS  . levothyroxine  50 mcg Oral QAC  breakfast  . loratadine  10 mg Oral Daily  . magnesium oxide  200 mg Oral Daily  . melatonin  10 mg Oral QHS  . mirtazapine  15 mg Oral QHS  . polyethylene glycol  17 g Oral  BID  . tamsulosin  0.4 mg Oral QPC supper   Continuous Infusions:   LOS: 5 days   Geradine Girt  [facility]   How to contact the [facility] Attending or Consulting provider [place] or covering provider during after hours [place], for this patient?  1. Check the care team in [facility] and look for a) attending/consulting [facility] provider listed and b) the [facility] team listed 2. Log into [email or web address] and use 's universal password to access. If you do not have the password, please contact the hospital operator. 3. Locate the [facility] provider you are looking for under [facility] and page to a number that you can be directly reached. 4. If you still have difficulty reaching the provider, please page the [facility] (Director on Call) for the Hospitalists listed on amion for assistance.  02/25/2020, 1:25 PM

## 2020-02-26 ENCOUNTER — Inpatient Hospital Stay (HOSPITAL_COMMUNITY): Payer: Medicare Other

## 2020-02-26 DIAGNOSIS — E440 Moderate protein-calorie malnutrition: Secondary | ICD-10-CM | POA: Insufficient documentation

## 2020-02-26 DIAGNOSIS — N184 Chronic kidney disease, stage 4 (severe): Secondary | ICD-10-CM | POA: Diagnosis not present

## 2020-02-26 DIAGNOSIS — N179 Acute kidney failure, unspecified: Secondary | ICD-10-CM | POA: Diagnosis not present

## 2020-02-26 LAB — CBC
HCT: 25.2 % — ABNORMAL LOW (ref 39.0–52.0)
Hemoglobin: 7.8 g/dL — ABNORMAL LOW (ref 13.0–17.0)
MCH: 30.2 pg (ref 26.0–34.0)
MCHC: 31 g/dL (ref 30.0–36.0)
MCV: 97.7 fL (ref 80.0–100.0)
Platelets: 297 10*3/uL (ref 150–400)
RBC: 2.58 MIL/uL — ABNORMAL LOW (ref 4.22–5.81)
RDW: 15.7 % — ABNORMAL HIGH (ref 11.5–15.5)
WBC: 22.1 10*3/uL — ABNORMAL HIGH (ref 4.0–10.5)
nRBC: 0.9 % — ABNORMAL HIGH (ref 0.0–0.2)

## 2020-02-26 LAB — COMPREHENSIVE METABOLIC PANEL
ALT: 225 U/L — ABNORMAL HIGH (ref 0–44)
AST: 107 U/L — ABNORMAL HIGH (ref 15–41)
Albumin: 2.5 g/dL — ABNORMAL LOW (ref 3.5–5.0)
Alkaline Phosphatase: 271 U/L — ABNORMAL HIGH (ref 38–126)
Anion gap: 13 (ref 5–15)
BUN: 75 mg/dL — ABNORMAL HIGH (ref 8–23)
CO2: 24 mmol/L (ref 22–32)
Calcium: 10.5 mg/dL — ABNORMAL HIGH (ref 8.9–10.3)
Chloride: 104 mmol/L (ref 98–111)
Creatinine, Ser: 2.34 mg/dL — ABNORMAL HIGH (ref 0.61–1.24)
GFR, Estimated: 26 mL/min — ABNORMAL LOW (ref >60)
Glucose, Bld: 119 mg/dL — ABNORMAL HIGH (ref 70–99)
Potassium: 4.5 mmol/L (ref 3.5–5.1)
Sodium: 141 mmol/L (ref 135–145)
Total Bilirubin: 0.9 mg/dL (ref 0.3–1.2)
Total Protein: 5.2 g/dL — ABNORMAL LOW (ref 6.5–8.1)

## 2020-02-26 LAB — CULTURE, BODY FLUID W GRAM STAIN -BOTTLE: Culture: NO GROWTH

## 2020-02-26 MED ORDER — SODIUM CHLORIDE 0.9 % IV SOLN
1.0000 g | INTRAVENOUS | Status: DC
  Administered 2020-02-26 – 2020-02-27 (×2): 1 g via INTRAVENOUS
  Filled 2020-02-26 (×2): qty 10

## 2020-02-26 MED ORDER — SUCRALFATE 1 G PO TABS
1.0000 g | ORAL_TABLET | Freq: Three times a day (TID) | ORAL | Status: DC
  Administered 2020-02-26 (×2): 1 g via ORAL
  Filled 2020-02-26 (×3): qty 1

## 2020-02-26 MED ORDER — PANTOPRAZOLE SODIUM 40 MG PO TBEC
40.0000 mg | DELAYED_RELEASE_TABLET | Freq: Two times a day (BID) | ORAL | Status: DC
  Administered 2020-02-26 – 2020-02-27 (×3): 40 mg via ORAL
  Filled 2020-02-26 (×3): qty 1

## 2020-02-26 MED ORDER — ADULT MULTIVITAMIN W/MINERALS CH
1.0000 | ORAL_TABLET | Freq: Every day | ORAL | Status: DC
  Administered 2020-02-27: 1 via ORAL
  Filled 2020-02-26 (×2): qty 1

## 2020-02-26 MED ORDER — PANTOPRAZOLE SODIUM 40 MG PO TBEC: 40.0000 mg | DELAYED_RELEASE_TABLET | Freq: Every day | ORAL | Status: DC

## 2020-02-26 MED ORDER — ENSURE ENLIVE PO LIQD
237.0000 mL | Freq: Three times a day (TID) | ORAL | Status: DC
  Administered 2020-02-26 – 2020-02-28 (×6): 237 mL via ORAL
  Filled 2020-02-26: qty 237

## 2020-02-26 NOTE — Progress Notes (Signed)
Progress Note    SAEED TOREN  YOV:[id number] DOB: 1926/10/22  DOA: 03/10/2020 PCP: Isaac Bliss, Rayford Halsted, MD    Brief Narrative:     Medical records reviewed and are as summarized below:  Alexander Mcdonald is an 87 y.o. male with medical history significant ofhistory of HTN, bladder spasm, BPH, bradycardia, history of PVCs, carotid artery disease, gout, glaucoma, thrombosed hemorrhoid, hyperlipidemia, hypertension, prostate cancer, rosacea, unsteadiness. Patient presented secondary to an elevated creatinine and found to have an AKI in addition to a LLL pneumonia. Nephrology consulted. IV antibiotics initiated. Not improving over last 6 days.  Per nursing, not eating much.  Poor overall prognosis  Assessment/Plan:   Principal Problem:   Acute renal failure superimposed on stage 4 chronic kidney disease, unspecified acute renal failure type ([place]) Active Problems:   HYPERCHOLESTEROLEMIA   Essential hypertension   CAP (community acquired pneumonia)   Hyponatremia   Hypothyroidism   Aortic stenosis   Glaucoma   AKI on CKD stage IV Baseline creatinine of about 2.2-2.4. Creatinine of 4.43 on admission. In setting of recent heart catheterization. Given IV fluids on admission. No hydronephrosis on CT abdomen/pelvis. Improving slowly -IVF stopped on 2/6 -Daily CMP  Community acquired pneumonia LLL pneumonia seen on CT imaging. Patient underwent thoracentesis on 2/5 yielding 400 mL of clear yellow fluid; fluid analysis suggest probable transudate but difficult to be exact secondary to undetectable protein in pleural fluid. Strep/legionella negative. No sputum culture available. Pleural fluid culture (2/5) with no growth to date. Cytology with no evidence of malignancy. -Continue Ceftriaxone -will add on pleural fluid pH if not too late -check CT scan of chest (doubt esophageal rupture but patient has h/o severe erosive esophagitis that he did not take plavix/carafate for in  2019)-- may need dg esophagus  Acute respiratory failure with hypoxia Likely secondary to pneumonia. SpO2 as low as 87% on room air on admission. Initially placed on 2 lpm of oxygen and weaned to room air. Overnight on 2/6, patient developed some dyspnea concern for fluid overload. BNP significantly elevated. Given Lasix with improvement. -Continue oxygen and wean to room air as able -Keep SpO2 > 92% -incentive spirometry  Acute urinary retention Unknown etiology. Foley catheter placed on 2/4. Urinalysis does not suggest infection, but does show pyuria. Urine cultures (2/4 and 2/5) with multiple species. On antibiotics to cover CAP. -foley d/c'd  Chronic diastolic heart failure -hold home lasix while PO intake poor  Bradycardia/Tachycardia Bradycardia resolved after discontinuing labetalol then developed tachycardia -Continue Telemetry -Continue home labetalol at 50 mg BID (half dose)  Elevated AST/ALT/ALP Unknown etiology. CT abdomen/pelvis and RUQ ultrasound without any biliary pathology noted. No associated clinical symptoms. Normal bilirubin -d/c statin as trending up  Hyponatremia Mild. Improved with IV fluids. Resolved.  Hyperlipidemia -Continue Lipitor  Aortic stenosis -seen by cards on 2/8: After careful consideration of his multiple co-morbidities including advanced age, CKD stage 4, poor functional status and severe AS he is not felt to be a candidate for TAVR. We will plan to arrange cardiac follow with Dr. Sallyanne Kuster after his current hospitalization and will attempt medical management with careful diuresis. I would suspect that he will need to be discharged on Lasix, possibly the 40 mg three times weekly dosage as before. It would be reasonable to consider a palliative care consultation at this time.   Constipation Improved with dulcolax suppository -Continue Miralax BID  Primary hypertension -Continue hydralazine, labetalol  Hypothyroidism -Continue  Synthroid  Glaucoma -Continue apraclonidine,  latanoprost   Spoke with daughter regarding his poor PO intake--  started remeron, calorie count Delirium precautions Will get palliative care consult Poor overall prognosis- long discussion with daughter that I fear he will continue to de-compensate.  Family Communication/Anticipated D/C date and plan/Code Status   DVT prophylaxis: heparin Code Status: Full Code.  Family Communication: called daughter Disposition Plan: Status is: Inpatient  Remains inpatient appropriate because:Inpatient level of care appropriate due to severity of illness   Dispo: The patient is from: Home              Anticipated d/c is to: Home              Anticipated d/c date is: 2 days              Patient currently is not medically stable to d/c.   Difficult to place patient No         Medical Consultants:   Cards Renal Palliative care    Subjective:   Continues to c/o nausea and does not want to eat-- not sleeping well  Objective:    Vitals:   02/26/20 0322 02/26/20 0749 02/26/20 1102 02/26/20 1219  BP: (!) 123/49 (!) 119/42 (!) 112/51 (!) 98/35  Pulse: 94  68 73  Resp: 17 18  16   Temp: 97.7 F (36.5 C) 97.6 F (36.4 C)  97.8 F (36.6 C)  TempSrc: Axillary Oral  Oral  SpO2: 100%   98%  Weight:    68.7 kg  Height:    5\' 5"  (1.651 m)    Intake/Output Summary (Last 24 hours) at 02/26/2020 1340 Last data filed at 02/25/2020 1920 Gross per 24 hour  Intake 390 ml  Output 375 ml  Net 15 ml   Filed Weights   02/26/20 1219  Weight: 68.7 kg    Exam:  General: Appearance:     Frail male in no acute distress     Lungs:     Poor effort, diminished , on , respirations unlabored  Heart:    Normal heart rate. Normal rhythm.  +murmur  MS:   All extremities are intact.   Neurologic:   Will awaken and answer questions      Data Reviewed:   I have personally reviewed following labs and imaging studies:  Labs: Labs show the  following:   Basic Metabolic Panel: Recent Labs  Lab 02/22/20 0150 02/23/20 0029 02/24/20 0052 02/25/20 0027 02/26/20 0537  NA 138 140 145 143 141  K 3.4* 3.7 3.6 3.8 4.5  CL 108 111 110 107 104  CO2 18* 17* 24 24 24   GLUCOSE 145* 198* 204* 123* 119*  BUN 86* 70* 71* 66* 75*  CREATININE 3.07* 2.61* 2.43* 2.21* 2.34*  CALCIUM 9.2 8.8* 9.6 10.0 10.5*  MG 2.6*  --   --   --   --    GFR Estimated Creatinine Clearance: 17.9 mL/min (A) (by C-G formula based on SCr of 2.34 mg/dL (H)). Liver Function Tests: Recent Labs  Lab 02/22/20 0150 02/23/20 0029 02/24/20 0052 02/25/20 0027 02/26/20 0537  AST 74* 148* 175* 400* 107*  ALT 105* 127* 181* 339* 225*  ALKPHOS 216* 231* 237* 308* 271*  BILITOT 1.0 0.9 0.9 0.7 0.9  PROT 5.2* 5.1* 5.0* 4.7* 5.2*  ALBUMIN 2.2* 2.2* 2.2* 2.3* 2.5*   No results for input(s): LIPASE, AMYLASE in the last 168 hours. No results for input(s): AMMONIA in the last 168 hours. Coagulation profile Recent Labs  Lab 02/28/2020  1507  INR 1.2    CBC: Recent Labs  Lab 02/20/20 0353 02/21/20 0045 02/22/20 0150 02/23/20 0029 02/24/20 0052 02/25/20 0027 02/26/20 0537  WBC 18.3* 19.0* 18.9* 20.2* 19.0* 18.2* 22.1*  NEUTROABS 16.1* 16.7* 15.9*  --   --   --   --   HGB 9.9* 9.7* 9.1* 8.2* 7.9* 8.0* 7.8*  HCT 31.0* 29.5* 26.5* 25.9* 23.9* 24.6* 25.2*  MCV 92.5 90.8 89.2 92.5 91.9 93.5 97.7  PLT 254 292 302 307 290 288 297   Cardiac Enzymes: No results for input(s): CKTOTAL, CKMB, CKMBINDEX, TROPONINI in the last 168 hours. BNP (last 3 results) No results for input(s): PROBNP in the last 8760 hours. CBG: No results for input(s): GLUCAP in the last 168 hours. D-Dimer: No results for input(s): DDIMER in the last 72 hours. Hgb A1c: No results for input(s): HGBA1C in the last 72 hours. Lipid Profile: No results for input(s): CHOL, HDL, LDLCALC, TRIG, CHOLHDL, LDLDIRECT in the last 72 hours. Thyroid function studies: No results for input(s): TSH,  T4TOTAL, T3FREE, THYROIDAB in the last 72 hours.  Invalid input(s): FREET3 Anemia work up: No results for input(s): VITAMINB12, FOLATE, FERRITIN, TIBC, IRON, RETICCTPCT in the last 72 hours. Sepsis Labs: Recent Labs  Lab 02/23/2020 1356 03/08/2020 1814 02/20/20 0353 02/23/20 0029 02/24/20 0052 02/25/20 0027 02/26/20 0537  WBC  --   --    < > 20.2* 19.0* 18.2* 22.1*  LATICACIDVEN 1.7 1.0  --   --   --   --   --    < > = values in this interval not displayed.    Microbiology Recent Results (from the past 240 hour(s))  Blood culture (routine single)     Status: None   Collection Time: 02/28/2020  1:56 PM   Specimen: BLOOD LEFT HAND  Result Value Ref Range Status   Specimen Description BLOOD LEFT HAND  Final   Special Requests   Final    BOTTLES DRAWN AEROBIC ONLY Blood Culture results may not be optimal due to an inadequate volume of blood received in culture bottles   Culture   Final    NO GROWTH 5 DAYS Performed at [facility], [place] [address] [place], [place] [address]    Report Status 02/24/2020 FINAL  Final  SARS Coronavirus 2 by RT PCR (hospital order, performed in [facility] hospital lab) Nasopharyngeal Nasopharyngeal Swab     Status: None   Collection Time: 03/03/2020  2:41 PM   Specimen: Nasopharyngeal Swab  Result Value Ref Range Status   SARS Coronavirus 2 NEGATIVE NEGATIVE Final    Comment: (NOTE) SARS-CoV-2 target nucleic acids are NOT DETECTED.  The SARS-CoV-2 RNA is generally detectable in upper and lower respiratory specimens during the acute phase of infection. The lowest concentration of SARS-CoV-2 viral copies this assay can detect is 250 copies / mL. A negative result does not preclude SARS-CoV-2 infection and should not be used as the sole basis for treatment or other patient management decisions.  A negative result may occur with improper specimen collection / handling, submission of specimen other than nasopharyngeal swab, presence of viral  mutation(s) within the areas targeted by this assay, and inadequate number of viral copies (<250 copies / mL). A negative result must be combined with clinical observations, patient history, and epidemiological information.  Fact Sheet for Patients:   StrictlyIdeas.no  Fact Sheet for Healthcare Providers: BankingDealers.co.za  This test is not yet approved or  cleared by the [place] FDA  and has been authorized for detection and/or diagnosis of SARS-CoV-2 by FDA under an Emergency Use Authorization (EUA).  This EUA will remain in effect (meaning this test can be used) for the duration of the COVID-19 declaration under Section 564(b)(1) of the Act, 21 U.S.C. section 360bbb-3(b)(1), unless the authorization is terminated or revoked sooner.  Performed at [facility], [place] [address] [place], [place] [address]   MRSA PCR Screening     Status: None   Collection Time: 03/04/2020  7:52 PM   Specimen: Nasopharyngeal  Result Value Ref Range Status   MRSA by PCR NEGATIVE NEGATIVE Final    Comment:        The GeneXpert MRSA Assay (FDA approved for NASAL specimens only), is one component of a comprehensive MRSA colonization surveillance program. It is not intended to diagnose MRSA infection nor to guide or monitor treatment for MRSA infections. Performed at [facility], [place] [address] [place], [place] [address]   Urine culture     Status: Abnormal   Collection Time: 02/20/20 11:42 AM   Specimen: In/Out Cath Urine  Result Value Ref Range Status   Specimen Description IN/OUT CATH URINE  Final   Special Requests   Final    NONE Performed at [facility], [place] [address], [place], [place] [address]    Culture MULTIPLE SPECIES PRESENT, SUGGEST RECOLLECTION (A)  Final   Report Status 02/21/2020 FINAL  Final  Gram stain     Status: None   Collection Time: 02/21/20 11:14 AM   Specimen: PATH Cytology Pleural fluid   Result Value Ref Range Status   Specimen Description PLEURAL FLUID LEFT  Final   Special Requests NONE  Final   Gram Stain   Final    ABUNDANT WBC PRESENT,BOTH PMN AND MONONUCLEAR NO ORGANISMS SEEN Performed at [facility], [place] [address], [place], [place] [address]    Report Status 02/21/2020 FINAL  Final  Culture, body fluid-bottle     Status: None   Collection Time: 02/21/20 11:14 AM   Specimen: Pleura  Result Value Ref Range Status   Specimen Description PLEURAL FLUID LEFT  Final   Special Requests NONE  Final   Culture   Final    NO GROWTH 5 DAYS Performed at [place] [address] [place], [place] [address]    Report Status 02/26/2020 FINAL  Final  Culture, Urine     Status: Abnormal   Collection Time: 02/21/20  7:52 PM   Specimen: Urine, Catheterized  Result Value Ref Range Status   Specimen Description URINE, CATHETERIZED  Final   Special Requests   Final    STERILE CUP Performed at [facility], [place] [address] [place],  [address]    Culture MULTIPLE SPECIES PRESENT, SUGGEST RECOLLECTION (A)  Final   Report Status 02/23/2020 FINAL  Final    Procedures and diagnostic studies:  No results found.  Medications:   . apraclonidine  1 drop Left Eye TID  . vitamin C  1,000 mg Oral Daily  . aspirin EC  81 mg Oral Daily  . bisacodyl  10 mg Rectal Once  . feeding supplement  237 mL Oral BID BM  . heparin  5,000 Units Subcutaneous Q8H  . labetalol  50 mg Oral BID  . latanoprost  1 drop Left Eye QHS  . levothyroxine  50 mcg Oral QAC breakfast  . loratadine  10 mg Oral Daily  . magnesium  oxide  200 mg Oral Daily  . melatonin  10 mg Oral QHS  . mirtazapine  15 mg Oral QHS  . pantoprazole  40 mg Oral BID  . polyethylene glycol  17 g Oral BID  . sucralfate  1 g Oral TID WC & HS  . tamsulosin  0.4 mg Oral QPC supper   Continuous Infusions: . cefTRIAXone (ROCEPHIN)  IV       LOS: 6 days   Geradine Girt  [facility]   How to contact the [facility] Attending or Consulting provider [place] or covering provider during after hours [place], for this patient?  1. Check the care team in [facility] and look for a) attending/consulting TR[facility] provider listed and b) the [facility] team listed 2. Log into [email or web address] and use [place]'s universal password to access. If you do not have the password, please contact the hospital operator. 3. Locate the [facility] provider you are looking for under [facility] and page to a number that you can be directly reached. 4. If you still have difficulty reaching the provider, please page the [facility] (Director on Call) for the Hospitalists listed on amion for assistance.  02/26/2020, 1:40 PM

## 2020-02-26 NOTE — Progress Notes (Addendum)
Initial Nutrition Assessment  DOCUMENTATION CODES:   Non-severe (moderate) malnutrition in context of chronic illness  INTERVENTION:   -Ensure Enlive TID, each supplement provides 350 kcal, 20 grams protein   -Magic Cup TID, each supplement provides, each supplement provides 290 kcals, 9 grams protein    -Recommend liberalizing diet to regular, secure chat sent to MD, awaiting response  -48 hr calorie count, please see calorie count note for details  -MVI with minerals   NUTRITION DIAGNOSIS:   Moderate Malnutrition related to chronic illness (CKD stage 4) as evidenced by moderate muscle depletion,moderate fat depletion.  GOAL:   Patient will meet greater than or equal to 90% of their needs  MONITOR:   PO intake,Supplement acceptance,Skin,Labs,Weight trends  REASON FOR ASSESSMENT:   Consult Calorie Count  ASSESSMENT:   72 YOF admitted for acute renal failure (unspecified failure type) superimposed on stage 4 CKD, CAP. PMH acute renal failure, CAD s/pt cardiac catheterization (02/13/20), diverticulosis, gout, HTN, hypercholesterolemia, prostate ca, legal blindness, hyponatremia.  Pt's notes indicate that pt has been consuming 0-20% of his meals. RN discussed with intern that pt had not been eating well since he's been in the hospital. Notes indicate daughter states that pt has had a decreased appetite and overall decreased oral intake while at home. Pt was unable to provide nutrition history d/t decreased cognition and unable to wake.   Ensure [employer] and [employer] supplements have been added TID to aid in pt meeting his nutritional goals.   Pt's weights show a consistent trend. Pt has experienced a 3% weight loss in 3 months which is not significant.    48 hour calorie count in place which was start 02/09. Please see calorie count note for details.   Palliative care consult is in place. Intern agrees with this and feels it is appropriate given very low po intake. No note  yet from palliative care.   I&Os Reviewed.   Meds Reviewed: Vitamin C (1000 mg, daily), Ensure Enlive BID, Mag-Ox (200 mg, daily), Miralax (17 g, BID), Remeron (15 mg, daily)  Labs Reviewed: Glucose (119 mg/dL), BUN (75 mg/dL), Creatinine (2.34 mg/dL), Calcium (10.5 mg/dL), GFR (26 mL/min)  NUTRITION - FOCUSED PHYSICAL EXAM:  Flowsheet Row Most Recent Value  Orbital Region Mild depletion  Upper Arm Region Moderate depletion  Thoracic and Lumbar Region Mild depletion  Buccal Region Moderate depletion  Temple Region Moderate depletion  Clavicle Bone Region Moderate depletion  Clavicle and Acromion Bone Region Moderate depletion  Scapular Bone Region Unable to assess  Dorsal Hand Mild depletion  Patellar Region Mild depletion  Anterior Thigh Region Moderate depletion  Posterior Calf Region Mild depletion  Edema (RD Assessment) None  Hair Reviewed  Eyes Reviewed  Mouth Unable to assess  Skin Reviewed  Nails Reviewed       Diet Order:   Diet Order            Diet Heart Room service appropriate? Yes; Fluid consistency: Thin  Diet effective now                 EDUCATION NEEDS:   No education needs have been identified at this time  Skin:  Skin Assessment: Reviewed RN Assessment  Last BM:  02/23/20  Height:   Ht Readings from Last 1 Encounters:  02/26/20 5\' 5"  (1.651 m)    Weight:   Wt Readings from Last 1 Encounters:  02/26/20 68.7 kg    Ideal Body Weight:  59 kg  BMI:  Body  mass index is 25.2 kg/m.  Estimated Nutritional Needs:   Kcal:  1550-1750  Protein:  70-85 g  Fluid:  >/=1.55 L  Salvadore Oxford, Dietetic Intern 02/26/2020 2:29 PM

## 2020-02-26 NOTE — Evaluation (Signed)
Clinical/Bedside Swallow Evaluation Patient Details  Name: Alexander Mcdonald MRN: [id number] Date of Birth: 08-Jul-1926  Today's Date: 02/26/2020 Time:        Past Medical History:  Past Medical History:  Diagnosis Date  . ACUTE RENAL FAILURE W/LESION OF TUBULAR NECROSIS 01/04/2007  . Aortic stenosis 08/27/2019  . Bladder spasm 06/04/2014  . BPH (benign prostatic hyperplasia) 06/04/2014  . Bradycardia 06/04/2019  . CAROTID ARTERY DISEASE 06/01/2009  . DEPRESSION 07/25/2006  . DIVERTICULOSIS OF COLON 02/01/2007  . Glaucoma   . GOUT 07/25/2006  . HEMORRHOID, THROMBOSED 02/15/2009  . HYPERCHOLESTEROLEMIA 07/25/2006  . HYPERTENSION 07/25/2006  . PROSTATE CANCER, HX OF 07/25/2006  . PVC (premature ventricular contraction) 08/27/2019  . Rosacea 09/28/2008  . Unsteadiness 06/04/2019  . WEIGHT LOSS 09/01/2009   Past Surgical History:  Past Surgical History:  Procedure Laterality Date  . BIOPSY  07/11/2017   Procedure: BIOPSY;  Surgeon: Gatha Mayer, MD;  Location: [facility];  Service: Endoscopy;;  . BRONCHIAL BRUSHINGS  07/11/2017   Procedure: ESOPHAGEAL BRUSHINGS;  Surgeon: Gatha Mayer, MD;  Location: [facility];  Service: Endoscopy;;  . CAROTID ENDARTERECTOMY    . CATARACT EXTRACTION    . ESOPHAGOGASTRODUODENOSCOPY (EGD) WITH PROPOFOL N/A 07/11/2017   Procedure: ESOPHAGOGASTRODUODENOSCOPY (EGD) WITH PROPOFOL;  Surgeon: Gatha Mayer, MD;  Location: [facility];  Service: Endoscopy;  Laterality: N/A;  . RIGHT/LEFT HEART CATH AND CORONARY ANGIOGRAPHY N/A 02/13/2020   Procedure: RIGHT/LEFT HEART CATH AND CORONARY ANGIOGRAPHY;  Surgeon: Sherren Mocha, MD;  Location: [facility];  Service: Cardiovascular;  Laterality: N/A;   HPI:  (P) Alexander Mcdonald is an 87 y.o. male with medical history significant of history of HTN, bladder spasm, BPH, bradycardia, history of PVCs, carotid artery disease, gout, glaucoma, thrombosed hemorrhoid, hyperlipidemia, hypertension, prostate cancer, rosacea,  unsteadiness. Patient presented secondary to an elevated creatinine and found to have an AKI in addition to a LLL pneumonia. Pt reportedly with poor PO intake. Pt had an MBS in 2013 showing mild dysphagia with residue that cleared with multiple swallowing. Pt also had an EGD in 2019 that showed "Severe esophagitis with bleeding in the distal esophagus...(and) one non-bleeding cratered duodenal ulcer..."   H. pylori serology was negative.  He was told to continue his PPI daily and Carafate 4 times daily but he stopped taking this shortly after due to resolution of symptoms per GI note.   Assessment / Plan / Recommendation Clinical Impression  (P) Pt demonstrates no immediate signs of aspiration, but does complain of pain with every swallow and points to the bottom of his sternum. His intake has been poor. He is grimacing and does not want more than a few sips and one bite of banana. Pt has a history of severe esophagitis in the distal esophagus and does not appear to be on any PPI regularly. Suggest f/u with GI for further assessment. No SLP interventions needed at this time. SLP Visit Diagnosis: (P) Dysphagia, unspecified (R13.10)    Aspiration Risk  (P) Mild aspiration risk;Risk for inadequate nutrition/hydration    Diet Recommendation (P) Thin liquid;Regular   Liquid Administration via: (P) Cup;Straw Medication Administration: (P) Whole meds with puree Supervision: (P) Patient able to self feed Compensations: (P) Slow rate;Small sips/bites Postural Changes: (P) Seated upright at 90 degrees    Other  Recommendations Recommended Consults: (P) Consider GI evaluation Oral Care Recommendations: (P) Oral care BID   Follow up Recommendations        Frequency and Duration  Prognosis        Swallow Study   General HPI: (P) Alexander Mcdonald is an 87 y.o. male with medical history significant of history of HTN, bladder spasm, BPH, bradycardia, history of PVCs, carotid artery  disease, gout, glaucoma, thrombosed hemorrhoid, hyperlipidemia, hypertension, prostate cancer, rosacea, unsteadiness. Patient presented secondary to an elevated creatinine and found to have an AKI in addition to a LLL pneumonia. Pt reportedly with poor PO intake. Pt had an MBS in 2013 showing mild dysphagia with residue that cleared with multiple swallowing. Pt also had an EGD in 2019 that showed "Severe esophagitis with bleeding in the distal esophagus...(and) one non-bleeding cratered duodenal ulcer..."   H. pylori serology was negative.  He was told to continue his PPI daily and Carafate 4 times daily but he stopped taking this shortly after due to resolution of symptoms per GI note. Type of Study: (P) Bedside Swallow Evaluation Previous Swallow Assessment: (P) see HPI Diet Prior to this Study: (P) Regular;Thin liquids Temperature Spikes Noted: (P) No Respiratory Status: (P) Room air History of Recent Intubation: (P) No Behavior/Cognition: (P) Alert;Cooperative Oral Cavity Assessment: (P) Dry (black tongue) Oral Care Completed by SLP: (P) No Oral Cavity - Dentition: (P) Dentures, top;Dentures, bottom Self-Feeding Abilities: (P) Needs assist Patient Positioning: (P) Upright in bed Baseline Vocal Quality: (P) Normal;Low vocal intensity Volitional Cough: (P) Strong;Weak Volitional Swallow: (P) Able to elicit    Oral/Motor/Sensory Function     Ice Chips     Thin Liquid Thin Liquid: (P) Within functional limits Presentation: (P) Straw    Nectar Thick Nectar Thick Liquid: (P) Not tested   Honey Thick Honey Thick Liquid: (P) Not tested   Puree Puree: (P) Within functional limits   Solid     Solid: (P) Within functional limits     Herbie Baltimore, MA CCC-SLP  Acute Rehabilitation Services Pager [phone] Office [phone]  Lynann Beaver 02/26/2020,12:27 PM

## 2020-02-26 NOTE — Plan of Care (Signed)

## 2020-02-26 NOTE — Progress Notes (Signed)
Calorie Count Note: Day 1  48-hour calorie count ordered. Calorie count started 02/09 at lunch meal. Please see day 1 results below.   Diet: Heart Healthy  Supplements: Ensure Enlive TID, Magic Cup TID  02/09 Lunch: 655 calories, 32 grams of protein (1.5 Ensure Enlive and ice cream) 02/09 Dinner: 350 calories, 20 grams of protein (1 Ensure Enlive) 02/10 Breakfast: 0 calories, 0 grams of protein  Day 1 Total Intake:  1005 calories (65% of minimum estimated needs)  52 grams of protein (74% of minimum estimated needs)  Nutrition Diagnosis: Moderate Malnutrition related to chronic illness (CKD stage 4) as evidenced by moderate muscle depletion,moderate fat depletion.  Goal: Patient will meet greater than or equal to 90% of their needs  Intervention:  -continue 48 hour calorie count, will provide day 2 results 02/11 -Ensure Enlive TID -Magic Cup TID  -Recommend liberalizing diet to regular -MVI with minerals  Alexander Mcdonald, Dietetic Intern 02/26/2020 3:16 PM

## 2020-02-26 NOTE — Progress Notes (Signed)
Physical Therapy Treatment Patient Details Name: Alexander Mcdonald MRN: [id number] DOB: November 23, 1926 Today's Date: 02/26/2020    History of Present Illness The pt is a 87 yo male presenting from home with abnormal labs (WBC 23.8, Creatinine 3.56, BUN 83). Upon workup, pt found to have AKI on CKD IV, CAP. PMH includes: aortic stenosis with TAVR pending, glaucoma (legally blind, some sight at midline in L eye), HTN, CAD, HLD, postate cancer, and "unsteadiness"    PT Comments    Pt transfer to another floor imminent so did not get pt to chair today. Pt slightly weaker today requiring modA for coming to the EoB, once there able to static sit with UE and perform therex. Pt comes to standing with min A for power up and static stand for approx 20 seconds before stepping towards HoB for return to bed with modA for management of LE back to bed. D/c plans remain appropriate at this time. PT will continue to follow acutely.     Follow Up Recommendations  Home health PT;Supervision/Assistance - 24 hour     Equipment Recommendations  None recommended by PT (pt well equipped)       Precautions / Restrictions Precautions Precautions: Fall Precaution Comments: per daughter, has slid to floor during stand-sit multiple times in last year, legally blind but some vision at midline in L eye. Restrictions Weight Bearing Restrictions: No    Mobility  Bed Mobility Overal bed mobility: Needs Assistance Bed Mobility: Supine to Sit     Supine to sit: Mod assist Sit to supine: Mod assist   General bed mobility comments: modA for management of LE off bed and for bringing trunk to upright, modA for returning LE to bed    Transfers Overall transfer level: Needs assistance Equipment used: Rolling walker (2 wheeled) Transfers: Sit to/from Stand Sit to Stand: Min assist         General transfer comment: min A for power up, continues to need cuing for hand placement  Ambulation/Gait Ambulation/Gait  assistance: Min assist Gait Distance (Feet): 3 Feet Assistive device: Rolling walker (2 wheeled) Gait Pattern/deviations: Step-to pattern;Decreased stride length;Shuffle;Trunk flexed;Narrow base of support Gait velocity: decr Gait velocity interpretation: <1.31 ft/sec, indicative of household ambulator General Gait Details: min A for steadying with lateral stepping towards the head of the bed         Balance Overall balance assessment: Needs assistance;History of Falls Sitting-balance support: Bilateral upper extremity supported;Feet supported Sitting balance-Leahy Scale: Poor Sitting balance - Comments: UE supported   Standing balance support: Bilateral upper extremity supported Standing balance-Leahy Scale: Poor Standing balance comment: reliant on BUE support                            Cognition Arousal/Alertness: Awake/alert Behavior During Therapy: WFL for tasks assessed/performed Overall Cognitive Status: History of cognitive impairments - at baseline                                 General Comments: requires increased multimodal cuing for mobility, especially due to decreased vision,      Exercises General Exercises - Upper Extremity Elbow Flexion: Strengthening;AROM;10 reps Elbow Extension: Strengthening;AROM;Both;10 reps General Exercises - Lower Extremity Ankle Circles/Pumps: AROM;Both;10 reps;Seated Long Arc Quad: AAROM;Both;10 reps;Seated    General Comments General comments (skin integrity, edema, etc.): VSS on 2L O2 via [place], pt noted to have wounds on tops of  bilateral ears from [place], removed from behind ears and placed behind his head. RN notified      Pertinent Vitals/Pain Pain Assessment: Faces Faces Pain Scale: Hurts a little bit Pain Location: R thigh with movement Pain Descriptors / Indicators: Grimacing;Moaning Pain Intervention(s): Limited activity within patient's tolerance;Monitored during session;Repositioned            PT Goals (current goals can now be found in the care plan section) Acute Rehab PT Goals Patient Stated Goal: to get stronger PT Goal Formulation: With patient/family Time For Goal Achievement: 03/06/20 Potential to Achieve Goals: Fair Progress towards PT goals: Progressing toward goals    Frequency    Min 3X/week      PT Plan Current plan remains appropriate       AM-PAC PT "6 Clicks" Mobility   Outcome Measure  Help needed turning from your back to your side while in a flat bed without using bedrails?: A Little Help needed moving from lying on your back to sitting on the side of a flat bed without using bedrails?: A Little Help needed moving to and from a bed to a chair (including a wheelchair)?: A Lot Help needed standing up from a chair using your arms (e.g., wheelchair or bedside chair)?: A Little Help needed to walk in hospital room?: A Little Help needed climbing 3-5 steps with a railing? : A Lot 6 Click Score: 16    End of Session Equipment Utilized During Treatment: Gait belt Activity Tolerance: Patient tolerated treatment well Patient left: with call bell/phone within reach;with bed alarm set;in bed Nurse Communication: Mobility status PT Visit Diagnosis: Difficulty in walking, not elsewhere classified (R26.2);History of falling (Z91.81)     Time: 8325-4982 PT Time Calculation (min) (ACUTE ONLY): 16 min  Charges:  $Therapeutic Exercise: 8-22 mins                     Lincon Sahlin B. Migdalia Dk PT, DPT Acute Rehabilitation Services Pager [phone] Office [phone]    Mucarabones 02/26/2020, 1:08 PM

## 2020-02-27 DIAGNOSIS — J189 Pneumonia, unspecified organism: Secondary | ICD-10-CM

## 2020-02-27 DIAGNOSIS — A419 Sepsis, unspecified organism: Secondary | ICD-10-CM | POA: Diagnosis not present

## 2020-02-27 DIAGNOSIS — R6520 Severe sepsis without septic shock: Secondary | ICD-10-CM

## 2020-02-27 DIAGNOSIS — I350 Nonrheumatic aortic (valve) stenosis: Secondary | ICD-10-CM

## 2020-02-27 DIAGNOSIS — I5033 Acute on chronic diastolic (congestive) heart failure: Secondary | ICD-10-CM

## 2020-02-27 DIAGNOSIS — N184 Chronic kidney disease, stage 4 (severe): Secondary | ICD-10-CM | POA: Diagnosis not present

## 2020-02-27 DIAGNOSIS — N179 Acute kidney failure, unspecified: Secondary | ICD-10-CM | POA: Diagnosis not present

## 2020-02-27 LAB — CBC WITH DIFFERENTIAL/PLATELET
Abs Immature Granulocytes: 2.13 10*3/uL — ABNORMAL HIGH (ref 0.00–0.07)
Basophils Absolute: 0.1 10*3/uL (ref 0.0–0.1)
Basophils Relative: 0 %
Eosinophils Absolute: 0.1 10*3/uL (ref 0.0–0.5)
Eosinophils Relative: 0 %
HCT: 24.6 % — ABNORMAL LOW (ref 39.0–52.0)
Hemoglobin: 7.5 g/dL — ABNORMAL LOW (ref 13.0–17.0)
Immature Granulocytes: 8 %
Lymphocytes Relative: 12 %
Lymphs Abs: 3.2 10*3/uL (ref 0.7–4.0)
MCH: 30 pg (ref 26.0–34.0)
MCHC: 30.5 g/dL (ref 30.0–36.0)
MCV: 98.4 fL (ref 80.0–100.0)
Monocytes Absolute: 1.3 10*3/uL — ABNORMAL HIGH (ref 0.1–1.0)
Monocytes Relative: 5 %
Neutro Abs: 19.9 10*3/uL — ABNORMAL HIGH (ref 1.7–7.7)
Neutrophils Relative %: 75 %
Platelets: 370 10*3/uL (ref 150–400)
RBC: 2.5 MIL/uL — ABNORMAL LOW (ref 4.22–5.81)
RDW: 16.7 % — ABNORMAL HIGH (ref 11.5–15.5)
WBC: 26.7 10*3/uL — ABNORMAL HIGH (ref 4.0–10.5)
nRBC: 0.7 % — ABNORMAL HIGH (ref 0.0–0.2)

## 2020-02-27 LAB — COMPREHENSIVE METABOLIC PANEL
ALT: 159 U/L — ABNORMAL HIGH (ref 0–44)
AST: 81 U/L — ABNORMAL HIGH (ref 15–41)
Albumin: 2.6 g/dL — ABNORMAL LOW (ref 3.5–5.0)
Alkaline Phosphatase: 244 U/L — ABNORMAL HIGH (ref 38–126)
Anion gap: 14 (ref 5–15)
BUN: 96 mg/dL — ABNORMAL HIGH (ref 8–23)
CO2: 21 mmol/L — ABNORMAL LOW (ref 22–32)
Calcium: 10.5 mg/dL — ABNORMAL HIGH (ref 8.9–10.3)
Chloride: 104 mmol/L (ref 98–111)
Creatinine, Ser: 2.87 mg/dL — ABNORMAL HIGH (ref 0.61–1.24)
GFR, Estimated: 20 mL/min — ABNORMAL LOW (ref >60)
Glucose, Bld: 173 mg/dL — ABNORMAL HIGH (ref 70–99)
Potassium: 5.4 mmol/L — ABNORMAL HIGH (ref 3.5–5.1)
Sodium: 139 mmol/L (ref 135–145)
Total Bilirubin: 1.1 mg/dL (ref 0.3–1.2)
Total Protein: 5.3 g/dL — ABNORMAL LOW (ref 6.5–8.1)

## 2020-02-27 LAB — C-REACTIVE PROTEIN: CRP: 1.6 mg/dL — ABNORMAL HIGH (ref <1.0)

## 2020-02-27 LAB — PROCALCITONIN: Procalcitonin: 0.51 ng/mL

## 2020-02-27 LAB — FERRITIN: Ferritin: 1241 ng/mL — ABNORMAL HIGH (ref 24–336)

## 2020-02-27 LAB — SARS CORONAVIRUS 2 (TAT 6-24 HRS): SARS Coronavirus 2: POSITIVE — AB

## 2020-02-27 LAB — OCCULT BLOOD X 1 CARD TO LAB, STOOL: Fecal Occult Bld: POSITIVE — AB

## 2020-02-27 LAB — D-DIMER, QUANTITATIVE: D-Dimer, Quant: 1.82 ug/mL-FEU — ABNORMAL HIGH (ref 0.00–0.50)

## 2020-02-27 MED ORDER — METHYLPREDNISOLONE SODIUM SUCC 40 MG IJ SOLR
0.5000 mg/kg | Freq: Two times a day (BID) | INTRAMUSCULAR | Status: DC
  Administered 2020-02-27 – 2020-02-29 (×4): 34.4 mg via INTRAVENOUS
  Filled 2020-02-27 (×4): qty 1

## 2020-02-27 MED ORDER — SODIUM CHLORIDE 0.9 % IV SOLN: 500.0000 mg | Freq: Two times a day (BID) | INTRAVENOUS | Status: DC

## 2020-02-27 MED ORDER — BISACODYL 10 MG RE SUPP
10.0000 mg | Freq: Once | RECTAL | Status: AC
  Administered 2020-02-27: 10 mg via RECTAL
  Filled 2020-02-27: qty 1

## 2020-02-27 MED ORDER — SODIUM CHLORIDE 0.9 % IV SOLN
100.0000 mg | Freq: Every day | INTRAVENOUS | Status: DC
  Administered 2020-02-28 – 2020-02-29 (×2): 100 mg via INTRAVENOUS
  Filled 2020-02-27 (×4): qty 20

## 2020-02-27 MED ORDER — SODIUM CHLORIDE 0.9 % IV SOLN
1.0000 g | INTRAVENOUS | Status: DC
  Administered 2020-02-27 – 2020-02-28 (×2): 1 g via INTRAVENOUS
  Filled 2020-02-27 (×3): qty 1

## 2020-02-27 MED ORDER — PREDNISONE 50 MG PO TABS: 50.0000 mg | ORAL_TABLET | Freq: Every day | ORAL | Status: DC

## 2020-02-27 MED ORDER — SODIUM CHLORIDE 0.9 % IV SOLN
200.0000 mg | Freq: Once | INTRAVENOUS | Status: AC
  Administered 2020-02-27: 200 mg via INTRAVENOUS
  Filled 2020-02-27: qty 40

## 2020-02-27 MED ORDER — SODIUM CHLORIDE 0.9 % IV SOLN: INTRAVENOUS | Status: DC

## 2020-02-27 MED ORDER — ORAL CARE MOUTH RINSE
15.0000 mL | Freq: Two times a day (BID) | OROMUCOSAL | Status: DC
  Administered 2020-02-27 – 2020-02-29 (×4): 15 mL via OROMUCOSAL

## 2020-02-27 NOTE — Consult Note (Signed)
Consultation Note Date: 02/27/2020   Patient Name: Alexander Mcdonald  DOB: 01/11/1927  MRN: [id number]  Age / Sex: 87 y.o., male  PCP: Isaac Bliss, Rayford Halsted, MD Referring Physician: Geradine Girt, DO  Reason for Consultation: Establishing goals of care  HPI/Patient Profile: 87 y.o. male  with past medical history of prostate cancer carotid artery disease, bradycardia, severe aortic stenosis, CKD stage IV, glaucoma, gout, severe erosive esophagitis who was admitted on 03/08/2020 with abnormal labs.  On admission he was found to have acute on chronic Mcdonald failure, pneumonia, and hyponatremia.  He has received hospital care for 9 days as of the time of this consult.  He has not improved.  Unfortunately his p.o. intake has been very poor.  Yesterday on 02/27/2020 he was found to be positive for Covid.  CT scan done 02/25/2010 showed bilateral pleural effusions and bilateral patchy infiltrates consistent with atypical pneumonia.  Clinical Assessment and Goals of Care:  I have reviewed medical records including EPIC notes, labs and imaging, received report from Dr. Eliseo Squires, examined the patient and met at bedside with his two daughters, grandaughter Alexander Mcdonald, as well as Alexander Mcdonald and Alexander Mcdonald (son) on the telephone  to discuss diagnosis prognosis, [place], EOL wishes, disposition and options.  I introduced Palliative Medicine as specialized medical care for people living with serious illness. It focuses on providing relief from the symptoms and stress of a serious illness.   We discussed a brief life review of the patient.  Jawgi (sp?) is described by has daughters as an incredibly sweet man.  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  Now he lives in [place] with his daughter Alexander Mcdonald and their two dogs.  Alexander Mcdonald is particularly fond of the dog Snoopy.  Daughter Alexander Mcdonald lives close by.  He has 3 children and 4 grand  children.  As far as functional and nutritional status he was able to move about the house with his walker and enjoyed sitting in his chair watching all types of sports.  He had a good appetite until just a few weeks ago.  Alexander Mcdonald had to provide some assistance with dressing.  His glaucoma has effected his quality of life - so that he now mostly stays in the house.  But his family feels that he is content.  We discussed his current illness and what it means in the larger context of his on-going co-morbidities.  Natural disease trajectory and expectations at EOL were discussed.  We focused on 4 primary areas of his health.  (1) his heart and valvular disease; (2) his chronic Mcdonald disease;  (3) his lungs; (4) nutritional status. The patient was going thru the evaluation process for a possible TAVR as he has severe aortic stenosis.  He also has grade 1 diastolic heart failure and mild regurg in multiple valves.  He underwent cardiac cath which cause acute on chronic Mcdonald disease in his kidneys that already had CKD stage 4.  His creatinine was elevated on admission, then improved  and now is worsening again.  [name] and I reviewed his lung CT showing impressive bilateral pleural effusions (likely multifactorial - parapneumonic, heart failure, CKD) and an impressive amount of patchy infiltrates (pneumonia) bilaterally.  We discussed his PO intake at length and discussed placing a cor trak versus continuing to encourage him to eat ensure and magic cup.  The family asked that we discuss artificial feeding with the patient.  We returned to the room and talked to him about the concerns of not being able to survive without an improvement in his nutrition.  The patient indicated that he would prefer to avoid the Cor Trak and attempt to take in more ensure and magic cup.  However he also indicated that if it was a matter of life and death he would accept a cor trak.  Refecting back on the patient's clinical appearance -  very weak, very wet vocal quality, gurgling - I'm very worried that he is quite fragile and any type of forced artificial feeding may simply worsen his fluid overload.  We discussed code status and the family completed a MOST form.  Tearfully they were unanimous in agreement that if he arrested he should not be coded and put on life support.  DNR, Limited interventions, yes to antibiotics and IVF if indicated.  Feeding tube was left blank.  This was signed by his daughter Alexander Mcdonald whom the patient has told me is his designated person to make those decisions.  The difference between aggressive medical intervention and comfort care was considered in light of the patient's goals of care.   My feeling from the family is that they do not want him to suffer.  However they want to continue to treat the treatable.  COVID was diagnosed yesterday and ID started new antibiotics.  Steroids and antivirals were started yesterday as well.  It seems very reasonable to give these interventions 2-3 days to see if they will benefit Alexander Mcdonald.  At the same time he appears very weak and his breathing is wet - if he declines at all I believe the family would want to take him home with hospice support for end of life.  Hospice and Palliative Care services outpatient were discussed.  The patient is currently receiving Palliative care.  While we want him to improve, If he does not then we will likely discuss comfort care or Hospice measures next week.  Questions and concerns were addressed.  The family was encouraged to call with questions or concerns.    Primary Decision Maker:  NEXT OF KIN  Alexander Mcdonald is his primary decision making surrogate - but she consults her siblings and the grand children.    SUMMARY OF RECOMMENDATIONS     Continue current care.  Family more aware of current health status.  Allow Alexander Mcdonald to remain at bedside during all visiting hours as a designated support person.  No cor Trak at this time.  We  continue efforts to encourage PO intake.  Changed code status to DNR.  Will re-meet with family early next week to determine next steps.  Son and grand daughter will come into town from out of state for Palliative meeting Monday or Tuesday.  Code Status/Advance Care Planning:  DNR   Symptom Management:   Will add very low dose robinul as patient is struggling with excessive secretions.  Additional Recommendations (Limitations, Scope, Preferences):  Full Scope Treatment  Palliative Prophylaxis:   Aspiration, Frequent Pain Assessment and Turn Reposition  Psycho-social/Spiritual:   Desire  for further Chaplaincy support: not discussed   Prognosis:  Very guarded.  Given advanced valvular disease, Mcdonald disease, bilateral pleural effusions and bilateral pneumonia with poor PO intake in a 87 yo gentleman.  I'm also concerned that after 9 days in a hospital bed he will likely be bedbound the rest of his life.   Should he decline what so ever his prognosis will be hours to days.    Discharge Planning: To Be Determined   Ideally home with hospice      Primary Diagnoses: Present on Admission: . Acute renal failure superimposed on stage 4 chronic Mcdonald disease, unspecified acute renal failure type ([place]) . Glaucoma . Essential hypertension . Hypothyroidism . Hyponatremia . HYPERCHOLESTEROLEMIA . CAP (community acquired pneumonia)   I have reviewed the medical record, interviewed the patient and family, and examined the patient. The following aspects are pertinent.  Past Medical History:  Diagnosis Date  . ACUTE RENAL FAILURE W/LESION OF TUBULAR NECROSIS 01/04/2007  . Aortic stenosis 08/27/2019  . Bladder spasm 06/04/2014  . BPH (benign prostatic hyperplasia) 06/04/2014  . Bradycardia 06/04/2019  . CAROTID ARTERY DISEASE 06/01/2009  . DEPRESSION 07/25/2006  . DIVERTICULOSIS OF COLON 02/01/2007  . Glaucoma   . GOUT 07/25/2006  . HEMORRHOID, THROMBOSED 02/15/2009  .  HYPERCHOLESTEROLEMIA 07/25/2006  . HYPERTENSION 07/25/2006  . PROSTATE CANCER, HX OF 07/25/2006  . PVC (premature ventricular contraction) 08/27/2019  . Rosacea 09/28/2008  . Unsteadiness 06/04/2019  . WEIGHT LOSS 09/01/2009   Social History   Socioeconomic History  . Marital status: Widowed    Spouse name: Not on file  . Number of children: 3  . Years of education: Not on file  . Highest education level: Not on file  Occupational History  . Occupation: Retired-[employer]  Tobacco Use  . Smoking status: Former Smoker    Types: Cigarettes    Quit date: 01/17/1988    Years since quitting: 32.1  . Smokeless tobacco: Never Used  Vaping Use  . Vaping Use: Never used  Substance and Sexual Activity  . Alcohol use: Yes    Alcohol/week: 7.0 standard drinks    Types: 7 Standard drinks or equivalent per week    Comment: beer  . Drug use: No  . Sexual activity: Never  Other Topics Concern  . Not on file  Social History Narrative  . Not on file   Social Determinants of Health   Financial Resource Strain: Not on file  Food Insecurity: Not on file  Transportation Needs: Not on file  Physical Activity: Not on file  Stress: Not on file  Social Connections: Not on file   Family History  Problem Relation Age of Onset  . Hypertension Father   . Heart attack Father   . Leukemia Sister     Allergies  Allergen Reactions  . Shellfish Allergy Anaphylaxis    Pt is allergic to mussels   . Brimonidine Itching    Itching and swelling red  . Brinzolamide-Brimonidine Itching  . Dorzolamide Hcl-Timolol Mal Other (See Comments)    Eye itch and redness  . Timolol Itching    Itching rash under eyes.      Vital Signs: BP (!) 104/40 (BP Location: Left Arm)   Pulse 85   Temp 98.4 F (36.9 C) (Oral)   Resp 20   Ht $R'5\' 5"'Ty$  (1.651 m)   Wt 69.1 kg   SpO2 93%   BMI 25.35 kg/m  Pain Scale: 0-10   Pain Score: 0-No pain  SpO2: SpO2: 93 % O2 Device:SpO2: 93 % O2 Flow Rate: .O2 Flow Rate  (L/min): 1 L/min    Palliative Assessment/Data: 20%     Time In: 12:00 Time Out: 2:00 Time Total: 120 min. Visit consisted of counseling and education dealing with the complex and emotionally intense issues surrounding the need for palliative care and symptom management in the setting of serious and potentially life-threatening illness. Greater than 50%  of this time was spent counseling and coordinating care related to the above assessment and plan.  Signed by: Florentina Jenny, PA-C Palliative Medicine  Please contact Palliative Medicine Team phone at [phone] for questions and concerns.  For individual provider: See Shea Evans

## 2020-02-27 NOTE — Consult Note (Signed)
[name]    Date of Admission:  03/04/2020     Reason for Consult: pna, leukocytosis    Referring Provider: Eliseo Mcdonald   Lines:  Peripheral iv's  Abx: 2/11-c remdesivir  2/03-c ceftriaxone  2/03-7 azith 2/03-5 vanc        Assessment: covid infection Lobar pna left lower lobe AKI on ckd (cardiorenal/left heart cath contrast) HFpEF with decompensation; moderate-severe AS Leukocytosis Hx pseudomonas uti  87 y.o. male pmh ckd 3 (bl cr 2.2), gout, prostate cancer, recurrent cystitis sx, bph, CAD, moderate to severe AS, glaucoma, macular degeneration, htn/hlp, hx PUD, rosacea admitted 2/03 for leukocytosis in setting left lower lobe opacity, course complicated by persistent leukocytosis despite empiric CAP treatment, and most recently new covid infection  2/03 covid pcr negative 2/07 acute hepatitis panel negative 2/11 covid pcr positive  2/03 blood culture negative 2/04 & 2/05 ucx multiple species present 2/05 pleural fluid cx negative; cell count/chemical analysis consistent with transudative process; path without malignant cells but did mention reactive mesothelial cells and inflammation  Patient with chronic failure to thrive/progressive dyspnea over the past year in setting moderate-severe AS and decompensated heart failure. Has had varying amount of volume overload here responding to intermittent diuresis the past month including this admission.    There is chest ct of LLL opacity. Granted he is 73, he still has minimal (outside of dyspnea confounded by chf exacerbation) syptomatology of pna (minimal stable o2 requirement for chf, stable chronic phlegm). And wbc is unbudged with CAP tx with azith/ctrx. Say if this is pna from mdro (he had pseudomonas in urine several years ago), if we missed treating this he should clinically decline (worsening wbc, increased o2, or more septic) which he hasn't   He has thoracentesis of left side on 2/05  which appears transudative/heart failure related rather than parapneumonic. No malignant cells  So is the wbc and the left lower lobe mass/opacity related. I am not sure. Is the left lower lobe mass infectious process? I am also not too sure -- ddx could be malignancy  He has multiple comorbidities that prevent more aggressive approach such as bronchoscopic sampling. So at this time, a trial would be to broaden his abx to treat presumptively for "hcap" spectrum. If there is no improvement, he could benefit from pulmonary evaluation for the left lower lobe mass/opacity. And also in the future consider w/u for hematologic process for leukocytosis  In the meantime, he is newly dx'ed with covid which is mild-mod in severity and is on steroid/remdesivir.     Plan:  1. Stop ceftriaxone; start meropenem for HCAP (has pseudomonas in urine previously); plan 5 days until 2/16 2. Get sputum cultures 3. Trend procalcitonin x3 4. Consider discussing case with pulmonology, and or outpatient f/u chest ct in 4-6 weeks with primary care provider 5. Consider hematologic w/u for leukocytosis if still persistent in a few weeks if HCAP tx without effect 6. Covid management and Volume management per primary team  ID will sign off  Principal Problem:   Acute renal failure superimposed on stage 4 chronic kidney disease, unspecified acute renal failure type ([name]) Active Problems:   HYPERCHOLESTEROLEMIA   Essential hypertension   CAP (community acquired pneumonia)   Hyponatremia   Hypothyroidism   Aortic stenosis   Glaucoma   Malnutrition of moderate degree   Scheduled Meds: . apraclonidine  1 drop Left Eye TID  . vitamin C  1,000 mg Oral  Daily  . aspirin EC  81 mg Oral Daily  . bisacodyl  10 mg Rectal Once  . feeding supplement  237 mL Oral TID BM  . heparin  5,000 Units Subcutaneous Q8H  . labetalol  50 mg Oral BID  . latanoprost  1 drop Left Eye QHS  . levothyroxine  50 mcg Oral QAC breakfast  .  loratadine  10 mg Oral Daily  . magnesium oxide  200 mg Oral Daily  . mouth rinse  15 mL Mouth Rinse BID  . melatonin  10 mg Oral QHS  . mirtazapine  15 mg Oral QHS  . multivitamin with minerals  1 tablet Oral Daily  . pantoprazole  40 mg Oral BID  . polyethylene glycol  17 g Oral BID  . sucralfate  1 g Oral TID WC & HS  . tamsulosin  0.4 mg Oral QPC supper   Continuous Infusions: . sodium chloride 50 mL/hr at 02/27/20 1027  . cefTRIAXone (ROCEPHIN)  IV 1 g (02/27/20 1313)   PRN Meds:.acetaminophen **OR** acetaminophen, albuterol, hydrALAZINE, ondansetron **OR** ondansetron (ZOFRAN) IV  HPI: Alexander Mcdonald is a 87 y.o. male pmh ckd 3 (bl cr 2.2), gout, prostate cancer, recurrent cystitis sx, bph, CAD, moderate to severe AS, glaucoma, macular degeneration, htn/hlp, hx PUD, rosacea admitted 2/03 for leukocytosis in setting left lower lobe opacity, course complicated by persistent leukocytosis despite empiric CAP treatment, and most recently new covid infection  Hx via chart and patient/family (daughter)  Patient has had declining health (functional status/dyspnea) over the past year  He had admissin 1/25-28/22 for dyspnea in setting volume over load/HFpEF (60%). Discharged with plan for outpatient TAVR. Had mention of cxr opacity although didn't deemed to have pna; no leukocytosis/sepsis; and no abx offered.   Outpatient had left heart cath in plan for tavr. Had lab f/u on 2/02 for lasix monitoring which showed leukocytosis 23 and recurrent aki (cr 3.6 <-- 2.1)so told to come for evaluation by his cardiologist.   Patient's baseline is tenuous with poor functional status and cognitive function. Family reports not eating much and perhaps generalized fatigue present/wrosening since last admission (an acute drop in health since the gradual past year decline). Stable cough with white phlegm for years. Intermittent o2 requirement since last admission. No f/c, rash, diarrhea, headache  He was  previously followed in the id clinic for recurrent uti/cystitis and hasn't been followed for other reason. Pseudomonas in urine cx previously  Hospital course: admision afebrile; hds; 97% room air Wbc 21, hg 12.2, platelets 282. Lactic acid was 1.7-->1 after 500 cc saline Trop 60; bnp 750 (1/25 bnp 1700) Cr 4.4; bun 101; lft 184/210/309/1.4 covid pcr negative bcx eventually negative cxr improved pulm edema from previous admission, more pronounced focal opacity left lower lobe Ct abd pelv without contrast with dense left lower lobe airspace consolidation, associated moderate sized adjacent parapneumonic effusion.  Small right pleural effusion with overlying atelectasis.  No acute abdominal/pelvic abscess, mass lesions or adenopathy.   Patient started on vanc/cefepime --> azith/ceftriaxone for pna 2/05 thoracentesis showed transudative process 2/07 increased o2 requirement, bnp increased to 2100, lft worsened 2/10 Despite being on abx for pna his wbc and failure to thrive picture is not improving. Oxygenation is improving since 2/07 with lasix administration. lft as well. Chest ct repeats showed increased bilateral opacities 2/11 covid testing checked again and was positive; wbc 27; has had nucleated rbc on peripheral smears. lft improves/worsened, and now improving again. methylprednisone started   Remains  afebrile this admission. sbp at times dropped to 90s (last done so 2/10)  Review of Systems: ROS  Past Medical History:  Diagnosis Date  . ACUTE RENAL FAILURE W/LESION OF TUBULAR NECROSIS 01/04/2007  . Aortic stenosis 08/27/2019  . Bladder spasm 06/04/2014  . BPH (benign prostatic hyperplasia) 06/04/2014  . Bradycardia 06/04/2019  . CAROTID ARTERY DISEASE 06/01/2009  . DEPRESSION 07/25/2006  . DIVERTICULOSIS OF COLON 02/01/2007  . Glaucoma   . GOUT 07/25/2006  . HEMORRHOID, THROMBOSED 02/15/2009  . HYPERCHOLESTEROLEMIA 07/25/2006  . HYPERTENSION 07/25/2006  . PROSTATE CANCER, HX OF 07/25/2006   . PVC (premature ventricular contraction) 08/27/2019  . Rosacea 09/28/2008  . Unsteadiness 06/04/2019  . WEIGHT LOSS 09/01/2009    Social History   Tobacco Use  . Smoking status: Former Smoker    Types: Cigarettes    Quit date: 01/17/1988    Years since quitting: 32.1  . Smokeless tobacco: Never Used  Vaping Use  . Vaping Use: Never used  Substance Use Topics  . Alcohol use: Yes    Alcohol/week: 7.0 standard drinks    Types: 7 Standard drinks or equivalent per week    Comment: beer  . Drug use: No    Family History  Problem Relation Age of Onset  . Hypertension Father   . Heart attack Father   . Leukemia Sister    Allergies  Allergen Reactions  . Shellfish Allergy Anaphylaxis    Pt is allergic to mussels   . Brimonidine Itching    Itching and swelling red  . Brinzolamide-Brimonidine Itching  . Dorzolamide Hcl-Timolol Mal Other (See Comments)    Eye itch and redness  . Timolol Itching    Itching rash under eyes.    OBJECTIVE: Blood pressure (!) 104/40, pulse 85, temperature 98.4 F (36.9 C), temperature source Oral, resp. rate 20, height 5\' 5"  (1.651 m), weight 69.1 kg, SpO2 93 %.  Physical Exam  Lab Results Lab Results  Component Value Date   WBC 26.7 (H) 02/27/2020   HGB 7.5 (L) 02/27/2020   HCT 24.6 (L) 02/27/2020   MCV 98.4 02/27/2020   PLT 370 02/27/2020    Lab Results  Component Value Date   CREATININE 2.87 (H) 02/27/2020   BUN 96 (H) 02/27/2020   NA 139 02/27/2020   K 5.4 (H) 02/27/2020   CL 104 02/27/2020   CO2 21 (L) 02/27/2020    Lab Results  Component Value Date   ALT 159 (H) 02/27/2020   AST 81 (H) 02/27/2020   ALKPHOS 244 (H) 02/27/2020   BILITOT 1.1 02/27/2020     Microbiology: Recent Results (from the past 240 hour(s))  Blood culture (routine single)     Status: None   Collection Time: 03/07/2020  1:56 PM   Specimen: BLOOD LEFT HAND  Result Value Ref Range Status   Specimen Description BLOOD LEFT HAND  Final   Special Requests    Final    BOTTLES DRAWN AEROBIC ONLY Blood Culture results may not be optimal due to an inadequate volume of blood received in culture bottles   Culture   Final    NO GROWTH 5 DAYS Performed at [facility], [place] [address], [place], [place] [address]    Report Status 02/24/2020 FINAL  Final  SARS Coronavirus 2 by RT PCR (hospital order, performed in [facility] hospital lab) Nasopharyngeal Nasopharyngeal Swab     Status: None   Collection Time: 02/28/2020  2:41 PM   Specimen: Nasopharyngeal Swab  Result  Value Ref Range Status   SARS Coronavirus 2 NEGATIVE NEGATIVE Final    Comment: (NOTE) SARS-CoV-2 target nucleic acids are NOT DETECTED.  The SARS-CoV-2 RNA is generally detectable in upper and lower respiratory specimens during the acute phase of infection. The lowest concentration of SARS-CoV-2 viral copies this assay can detect is 250 copies / mL. A negative result does not preclude SARS-CoV-2 infection and should not be used as the sole basis for treatment or other patient management decisions.  A negative result may occur with improper specimen collection / handling, submission of specimen other than nasopharyngeal swab, presence of viral mutation(s) within the areas targeted by this assay, and inadequate number of viral copies (<250 copies / mL). A negative result must be combined with clinical observations, patient history, and epidemiological information.  Fact Sheet for Patients:   StrictlyIdeas.no  Fact Sheet for Healthcare Providers: BankingDealers.co.za  This test is not yet approved or  cleared by the [place] FDA and has been authorized for detection and/or diagnosis of SARS-CoV-2 by FDA under an Emergency Use Authorization (EUA).  This EUA will remain in effect (meaning this test can be used) for the duration of the COVID-19 declaration under Section 564(b)(1) of the Act, 21 U.S.C. section 360bbb-3(b)(1),  unless the authorization is terminated or revoked sooner.  Performed at [facility], [place] [address], [place], [place] [address]   MRSA PCR Screening     Status: None   Collection Time: 02/25/2020  7:52 PM   Specimen: Nasopharyngeal  Result Value Ref Range Status   MRSA by PCR NEGATIVE NEGATIVE Final    Comment:        The GeneXpert MRSA Assay (FDA approved for NASAL specimens only), is one component of a comprehensive MRSA colonization surveillance program. It is not intended to diagnose MRSA infection nor to guide or monitor treatment for MRSA infections. Performed at [facility], [place] [address] [place], [place] [address]   Urine culture     Status: Abnormal   Collection Time: 02/20/20 11:42 AM   Specimen: In/Out Cath Urine  Result Value Ref Range Status   Specimen Description IN/OUT CATH URINE  Final   Special Requests   Final    NONE Performed at [facility], [place] [address], [place], [place] [address]    Culture MULTIPLE SPECIES PRESENT, SUGGEST RECOLLECTION (A)  Final   Report Status 02/21/2020 FINAL  Final  Gram stain     Status: None   Collection Time: 02/21/20 11:14 AM   Specimen: PATH Cytology Pleural fluid  Result Value Ref Range Status   Specimen Description PLEURAL FLUID LEFT  Final   Special Requests NONE  Final   Gram Stain   Final    ABUNDANT WBC PRESENT,BOTH PMN AND MONONUCLEAR NO ORGANISMS SEEN Performed at [facility], [place] [address], [place], [place] [address]    Report Status 02/21/2020 FINAL  Final  Culture, body fluid-bottle     Status: None   Collection Time: 02/21/20 11:14 AM   Specimen: Pleura  Result Value Ref Range Status   Specimen Description PLEURAL FLUID LEFT  Final   Special Requests NONE  Final   Culture   Final    NO GROWTH 5 DAYS Performed at [facility] [address] [place], [place] [address]    Report Status 02/26/2020 FINAL  Final  Culture, Urine     Status: Abnormal   Collection Time:  02/21/20  7:52 PM  Specimen: Urine, Catheterized  Result Value Ref Range Status   Specimen Description URINE, CATHETERIZED  Final   Special Requests   Final    STERILE CUP Performed at [facility], [address], [place], [place] [address]    Culture MULTIPLE SPECIES PRESENT, SUGGEST RECOLLECTION (A)  Final   Report Status 02/23/2020 FINAL  Final  SARS CORONAVIRUS 2 (TAT 6-24 HRS) Nasopharyngeal Nasopharyngeal Swab     Status: Abnormal   Collection Time: 02/27/20  7:45 AM   Specimen: Nasopharyngeal Swab  Result Value Ref Range Status   SARS Coronavirus 2 POSITIVE (A) NEGATIVE Final    Comment: (NOTE) SARS-CoV-2 target nucleic acids are DETECTED.  The SARS-CoV-2 RNA is generally detectable in upper and lower respiratory specimens during the acute phase of infection. Positive results are indicative of the presence of SARS-CoV-2 RNA. Clinical correlation with patient history and other diagnostic information is  necessary to determine patient infection status. Positive results do not rule out bacterial infection or co-infection with other viruses.  The expected result is Negative.  Fact Sheet for Patients: SugarRoll.be  Fact Sheet for Healthcare Providers: https://www.woods-mathews.com/  This test is not yet approved or cleared by the [place] FDA and  has been authorized for detection and/or diagnosis of SARS-CoV-2 by FDA under an Emergency Use Authorization (EUA). This EUA will remain  in effect (meaning this test can be used) for the duration of the COVID-19 declaration under Section 564(b)(1) of the Act, 21 U. S.C. section 360bbb-3(b)(1), unless the authorization is terminated or revoked sooner.   Performed at [facility], [place] [address] [place],  [address]     Imaging: 2/10 ct chest Reviewed 1. Moderate bilateral pleural effusions with consolidative changes of the majority of the left lower lobe and  partial right lower lobe which may represent atelectasis or infiltrate. Underlying mass is not excluded. Clinical correlation and follow-up to resolution Recommended.  2. Bilateral upper lobe patchy and streaky densities most consistent with pneumonia, likely viral or atypical in etiology. Including COVID-19. Clinical correlation and follow-up to resolution recommended. 3. Aortic Atherosclerosis  2/07 ruq u/s No acute findings in the right upper abdomen. Right pleural effusion.  2/03 ct abd/pelv 1. Dense left lower lobe airspace consolidation consistent with lobar pneumonia. Associated moderate-sized adjacent parapneumonic effusion. 2. Small right pleural effusion with overlying atelectasis. 3. No acute abdominal/pelvic findings, mass lesions or adenopathy. 4. Advanced atherosclerotic calcifications involving the aorta and iliac arteries and branch vessels. 5. Advanced sigmoid colon diverticulosis but no findings for acute diverticulitis. 6. Simple appearing renal cysts. 7. Enlarged prostate gland  Jabier Mutton, [facility] [place] [phone] pager    02/27/2020, 3:33 PM

## 2020-02-27 NOTE — Progress Notes (Signed)
Pharmacy Antibiotic Note  Alexander Mcdonald is a 87 y.o. male admitted on 02/18/2020 with HAP pneumonia found to be COVID positive. Patient continued to have a high WBC and increased BL opacities despite ceftriaxone/azithromycin. Pharmacy has been consulted for meropenem dosing per ID recs.   ClCr ~43ml/min  Plan: Meropenem 1g Q24hr x5 d Monitor cultures, clinical status, renal fx   Height: 5\' 5"  (165.1 cm) Weight: 69.1 kg (152 lb 5.4 oz) IBW/kg (Calculated) : 61.5  Temp (24hrs), Avg:98.4 F (36.9 C), Min:97.8 F (36.6 C), Max:98.9 F (37.2 C)  Recent Labs  Lab 02/21/20 0045 02/22/20 0150 02/23/20 0029 02/24/20 0052 02/25/20 0027 02/26/20 0537 02/27/20 0531  WBC 19.0*   < > 20.2* 19.0* 18.2* 22.1* 26.7*  CREATININE 4.04*   < > 2.61* 2.43* 2.21* 2.34* 2.87*  VANCORANDOM 12  --   --   --   --   --   --    < > = values in this interval not displayed.    Estimated Creatinine Clearance: 14.6 mL/min (A) (by C-G formula based on SCr of 2.87 mg/dL (H)).    Allergies  Allergen Reactions  . Shellfish Allergy Anaphylaxis    Pt is allergic to mussels   . Brimonidine Itching    Itching and swelling red  . Brinzolamide-Brimonidine Itching  . Dorzolamide Hcl-Timolol Mal Other (See Comments)    Eye itch and redness  . Timolol Itching    Itching rash under eyes.    Antimicrobials this admission: Azithro 2/3 >> 2/7 Ceftriaxone 2/3 >> 2/7, 2/10 > 2/11 Cefepime 2/3   Vancomycin 2/3 > 2/5 Remdesevir 2/11> 2/15 Meropenem 2/11 >> 2/15  Microbiology results: 2/11 COVID +  2/5 Pleural fluid ngtd 2/5 UCx: ngtd  2/3 BCx: ngtd 2/3 MRSA PCR: neg 2/3 COVID neg   Thank you for allowing pharmacy to be a part of this patient's care.  Benetta Spar, PharmD, BCPS, BCCP Clinical Pharmacist  Please check AMION for all [place] phone numbers After 10:00 PM, call [place] [phone]

## 2020-02-27 NOTE — Progress Notes (Signed)
Progress Note    Alexander Mcdonald  OJJ:[id number] DOB: 04/23/1926  DOA: 03/15/2020 PCP: Isaac Bliss, Rayford Halsted, MD    Brief Narrative:     Alexander Mcdonald is an 87 y.o. male with Alexander history significant ofhistory of HTN, bladder spasm, BPH, bradycardia, history of PVCs, carotid artery disease, gout, glaucoma, thrombosed hemorrhoid, hyperlipidemia, hypertension, prostate cancer, rosacea, unsteadiness. Patient presented secondary to an elevated creatinine and found to have an AKI in addition to a LLL pneumonia. Nephrology consulted. IV antibiotics initiated. Not improving over last 6 days.  Per nursing, not eating much since admission.  Poor overall prognosis.  + COVID 2/11   Assessment/Plan:   Principal Problem:   Acute renal failure superimposed on stage 4 chronic kidney disease, unspecified acute renal failure type ([place]) Active Problems:   HYPERCHOLESTEROLEMIA   Essential hypertension   CAP (community acquired pneumonia)   Hyponatremia   Hypothyroidism   Aortic stenosis   Glaucoma   Malnutrition of moderate degree   COVID 19 + -check inflammatory markers -steroids -remdesivir  AKI on CKD stage IV Baseline creatinine of about 2.2-2.4. Creatinine of 4.43 on admission. In setting of recent heart catheterization. Given IV fluids on admission. No hydronephrosis on CT abdomen/pelvis. Improving slowly -IVF stopped on 2/6 -Daily CMP  Community acquired pneumonia LLL pneumonia seen on CT imaging. Patient underwent thoracentesis on 2/5 yielding 400 mL of clear yellow fluid; fluid analysis suggest probable transudate but difficult to be exact secondary to undetectable protein in pleural fluid. Strep/legionella negative. No sputum culture available. Pleural fluid culture (2/5) with no growth to date. Cytology with no evidence of malignancy. -Continue Ceftriaxone -ID consult per family request - CT scan of chest :  1.   Moderate bilateral pleural effusions with consolidative changes of the majority of the left lower lobe and partial right lower lobe which may represent atelectasis or infiltrate. Underlying mass is not excluded. Clinical correlation and follow-up to resolution recommended. 2. Bilateral upper lobe patchy and streaky densities most consistent with pneumonia, likely viral or atypical in etiology. Including COVID-19. Clinical correlation and follow-up to resolution Recommended.  Acute respiratory failure with hypoxia Likely secondary to pneumonia. SpO2 as low as 87% on room air on admission. Initially placed on 2 lpm of oxygen and weaned to room air. Overnight on 2/6, patient developed some dyspnea concern for fluid overload. BNP significantly elevated. Given Lasix with improvement. -Continue oxygen and wean to room air as able -Keep SpO2 > 92% -incentive spirometry  Acute urinary retention Unknown etiology. Foley catheter placed on 2/4. Urinalysis does not suggest infection, but does show pyuria. Urine cultures (2/4 and 2/5) with multiple species. On antibiotics to cover CAP. -foley d/c'd  Chronic diastolic heart failure -hold home lasix while PO intake poor  Bradycardia/Tachycardia Bradycardia resolved after discontinuing labetalol then developed tachycardia -Continue Telemetry -Continue home labetalol at 50 mg BID (half dose)  Elevated AST/ALT/ALP Unknown etiology. CT abdomen/pelvis and RUQ ultrasound without any biliary pathology noted. No associated clinical symptoms. Normal bilirubin -d/c statin as trending up  Hyponatremia Mild. Improved with IV fluids. Resolved.  Hyperlipidemia -Continue Lipitor  Hyperkalemia -recheck in AM  Aortic stenosis -seen by cards on 2/8: After careful consideration of his multiple co-morbidities including advanced age, CKD stage 4, poor functional status and severe AS he is not felt to be a candidate for TAVR. We will plan to arrange  cardiac follow with Dr. Sallyanne Kuster after his current  hospitalization and will attempt Alexander management with careful diuresis. I would suspect that he will need to be discharged on Lasix, possibly the 40 mg three times weekly dosage as before. It would be reasonable to consider a palliative care consultation at this time.   Constipation Improved with dulcolax suppository -Continue Miralax BID  Primary hypertension -Continue hydralazine, labetalol  Hypothyroidism -Continue Synthroid  Severe erosive esophagitis -was supposed to have taken protonix and carafate in 2019 s/p EGD but did not -protonix while here  Glaucoma -Continue apraclonidine, latanoprost  Nutrition Status: Nutrition Problem: Moderate Malnutrition Etiology: chronic illness (CKD stage 4) Signs/Symptoms: moderate muscle depletion,moderate fat depletion Interventions: Ensure Enlive (each supplement provides 350kcal and 20 grams of protein),Magic cup,MVI  Pending plan with palliative, will need tube feeds    Spoke with daughter regarding his poor PO intake--  started remeron, calorie count Delirium precautions palliative care consult Poor overall prognosis- long discussion with daughter that I fear he will continue to de-compensate.  Family Communication/Anticipated D/C date and plan/Code Status   DVT prophylaxis: heparin Code Status: Full Code.  Family Communication: called daughter Disposition Plan: Status is: Inpatient  Remains inpatient appropriate because:Inpatient level of care appropriate due to severity of illness   Dispo: The patient is from: Home              Anticipated d/c is to: Home              Anticipated d/c date is: 2 days              Patient currently is not medically stable to d/c.   Difficult to place patient No         Alexander Consultants:   Cards Renal Palliative care ID    Subjective:   Calorie count finished with minimal intake Patient still refusing to  eat  Objective:    Vitals:   02/26/20 2000 02/27/20 0025 02/27/20 0400 02/27/20 0712  BP: (!) 106/48 (!) 98/38 (!) 104/40   Pulse:  78 85   Resp: 20 20 20    Temp: 98.3 F (36.8 C) 98.9 F (37.2 C) 97.8 F (36.6 C) 98.4 F (36.9 C)  TempSrc: Oral Oral Oral Oral  SpO2: 98% 92% 93%   Weight:   69.1 kg   Height:        Intake/Output Summary (Last 24 hours) at 02/27/2020 1552 Last data filed at 02/27/2020 0809 Gross per 24 hour  Intake 120 ml  Output -  Net 120 ml   Filed Weights   02/26/20 1219 02/27/20 0400  Weight: 68.7 kg 69.1 kg    Exam:  General: Appearance:     Frail male who is minimally responsive     Lungs:     respirations unlabored, on [place]  Heart:    Normal heart rate. Normal rhythm.    MS:   All extremities are intact.   Neurologic:   Minimally responsive      Data Reviewed:   I have personally reviewed following labs and imaging studies:  Labs: Labs show the following:   Basic Metabolic Panel: Recent Labs  Lab 02/22/20 0150 02/23/20 0029 02/24/20 0052 02/25/20 0027 02/26/20 0537 02/27/20 0531  NA 138 140 145 143 141 139  K 3.4* 3.7 3.6 3.8 4.5 5.4*  CL 108 111 110 107 104 104  CO2 18* 17* 24 24 24  21*  GLUCOSE 145* 198* 204* 123* 119* 173*  BUN 86* 70* 71* 66* 75* 96*  CREATININE 3.07* 2.61* 2.43*  2.21* 2.34* 2.87*  CALCIUM 9.2 8.8* 9.6 10.0 10.5* 10.5*  MG 2.6*  --   --   --   --   --    GFR Estimated Creatinine Clearance: 14.6 mL/min (A) (by C-G formula based on SCr of 2.87 mg/dL (H)). Liver Function Tests: Recent Labs  Lab 02/23/20 0029 02/24/20 0052 02/25/20 0027 02/26/20 0537 02/27/20 0531  AST 148* 175* 400* 107* 81*  ALT 127* 181* 339* 225* 159*  ALKPHOS 231* 237* 308* 271* 244*  BILITOT 0.9 0.9 0.7 0.9 1.1  PROT 5.1* 5.0* 4.7* 5.2* 5.3*  ALBUMIN 2.2* 2.2* 2.3* 2.5* 2.6*   No results for input(s): LIPASE, AMYLASE in the last 168 hours. No results for input(s): AMMONIA in the last 168 hours. Coagulation  profile No results for input(s): INR, PROTIME in the last 168 hours.  CBC: Recent Labs  Lab 02/21/20 0045 02/22/20 0150 02/23/20 0029 02/24/20 0052 02/25/20 0027 02/26/20 0537 02/27/20 0531  WBC 19.0* 18.9* 20.2* 19.0* 18.2* 22.1* 26.7*  NEUTROABS 16.7* 15.9*  --   --   --   --  19.9*  HGB 9.7* 9.1* 8.2* 7.9* 8.0* 7.8* 7.5*  HCT 29.5* 26.5* 25.9* 23.9* 24.6* 25.2* 24.6*  MCV 90.8 89.2 92.5 91.9 93.5 97.7 98.4  PLT 292 302 307 290 288 297 370   Cardiac Enzymes: No results for input(s): CKTOTAL, CKMB, CKMBINDEX, TROPONINI in the last 168 hours. BNP (last 3 results) No results for input(s): PROBNP in the last 8760 hours. CBG: No results for input(s): GLUCAP in the last 168 hours. D-Dimer: No results for input(s): DDIMER in the last 72 hours. Hgb A1c: No results for input(s): HGBA1C in the last 72 hours. Lipid Profile: No results for input(s): CHOL, HDL, LDLCALC, TRIG, CHOLHDL, LDLDIRECT in the last 72 hours. Thyroid function studies: No results for input(s): TSH, T4TOTAL, T3FREE, THYROIDAB in the last 72 hours.  Invalid input(s): FREET3 Anemia work up: No results for input(s): VITAMINB12, FOLATE, FERRITIN, TIBC, IRON, RETICCTPCT in the last 72 hours. Sepsis Labs: Recent Labs  Lab 02/24/20 0052 02/25/20 0027 02/26/20 0537 02/27/20 0531  WBC 19.0* 18.2* 22.1* 26.7*    Microbiology Recent Results (from the past 240 hour(s))  Blood culture (routine single)     Status: None   Collection Time: 03/02/2020  1:56 PM   Specimen: BLOOD LEFT HAND  Result Value Ref Range Status   Specimen Description BLOOD LEFT HAND  Final   Special Requests   Final    BOTTLES DRAWN AEROBIC ONLY Blood Culture results may not be optimal due to an inadequate volume of blood received in culture bottles   Culture   Final    NO GROWTH 5 DAYS Performed at [facility], [place] [address] [place], [place] [address]    Report Status 02/24/2020 FINAL  Final  SARS Coronavirus 2 by RT PCR  (hospital order, performed in [facility] hospital lab) Nasopharyngeal Nasopharyngeal Swab     Status: None   Collection Time: 03/09/2020  2:41 PM   Specimen: Nasopharyngeal Swab  Result Value Ref Range Status   SARS Coronavirus 2 NEGATIVE NEGATIVE Final    Comment: (NOTE) SARS-CoV-2 target nucleic acids are NOT DETECTED.  The SARS-CoV-2 RNA is generally detectable in upper and lower respiratory specimens during the acute phase of infection. The lowest concentration of SARS-CoV-2 viral copies this assay can detect is 250 copies / mL. A negative result does not preclude SARS-CoV-2 infection and should not be used as the sole basis  for treatment or other patient management decisions.  A negative result may occur with improper specimen collection / handling, submission of specimen other than nasopharyngeal swab, presence of viral mutation(s) within the areas targeted by this assay, and inadequate number of viral copies (<250 copies / mL). A negative result must be combined with clinical observations, patient history, and epidemiological information.  Fact Sheet for Patients:   StrictlyIdeas.no  Fact Sheet for Healthcare Providers: BankingDealers.co.za  This test is not yet approved or  cleared by the [place] FDA and has been authorized for detection and/or diagnosis of SARS-CoV-2 by FDA under an Emergency Use Authorization (EUA).  This EUA will remain in effect (meaning this test can be used) for the duration of the COVID-19 declaration under Section 564(b)(1) of the Act, 21 U.S.C. section 360bbb-3(b)(1), unless the authorization is terminated or revoked sooner.  Performed at [facility], [place] [address], [place], [place] [address]   MRSA PCR Screening     Status: None   Collection Time: 02/28/2020  7:52 PM   Specimen: Nasopharyngeal  Result Value Ref Range Status   MRSA by PCR NEGATIVE NEGATIVE Final    Comment:        The  GeneXpert MRSA Assay (FDA approved for NASAL specimens only), is one component of a comprehensive MRSA colonization surveillance program. It is not intended to diagnose MRSA infection nor to guide or monitor treatment for MRSA infections. Performed at [facility], [place] [address], [place], [place] [address]   Urine culture     Status: Abnormal   Collection Time: 02/20/20 11:42 AM   Specimen: In/Out Cath Urine  Result Value Ref Range Status   Specimen Description IN/OUT CATH URINE  Final   Special Requests   Final    NONE Performed at [facility], [place] [address] [place], [place] [address]    Culture MULTIPLE SPECIES PRESENT, SUGGEST RECOLLECTION (A)  Final   Report Status 02/21/2020 FINAL  Final  Gram stain     Status: None   Collection Time: 02/21/20 11:14 AM   Specimen: PATH Cytology Pleural fluid  Result Value Ref Range Status   Specimen Description PLEURAL FLUID LEFT  Final   Special Requests NONE  Final   Gram Stain   Final    ABUNDANT WBC PRESENT,BOTH PMN AND MONONUCLEAR NO ORGANISMS SEEN Performed at [facility], [place] [address] [place], [place] [address]    Report Status 02/21/2020 FINAL  Final  Culture, body fluid-bottle     Status: None   Collection Time: 02/21/20 11:14 AM   Specimen: Pleura  Result Value Ref Range Status   Specimen Description PLEURAL FLUID LEFT  Final   Special Requests NONE  Final   Culture   Final    NO GROWTH 5 DAYS Performed at [place] [address] [place], [place] [address]    Report Status 02/26/2020 FINAL  Final  Culture, Urine     Status: Abnormal   Collection Time: 02/21/20  7:52 PM   Specimen: Urine, Catheterized  Result Value Ref Range Status   Specimen Description URINE, CATHETERIZED  Final   Special Requests   Final    STERILE CUP Performed at [facility], [place] [address], [place], [place] [address]    Culture MULTIPLE SPECIES PRESENT, SUGGEST RECOLLECTION (A)  Final   Report  Status 02/23/2020 FINAL  Final  SARS CORONAVIRUS 2 (TAT 6-24 HRS) Nasopharyngeal Nasopharyngeal Swab  Status: Abnormal   Collection Time: 02/27/20  7:45 AM   Specimen: Nasopharyngeal Swab  Result Value Ref Range Status   SARS Coronavirus 2 POSITIVE (A) NEGATIVE Final    Comment: (NOTE) SARS-CoV-2 target nucleic acids are DETECTED.  The SARS-CoV-2 RNA is generally detectable in upper and lower respiratory specimens during the acute phase of infection. Positive results are indicative of the presence of SARS-CoV-2 RNA. Clinical correlation with patient history and other diagnostic information is  necessary to determine patient infection status. Positive results do not rule out bacterial infection or co-infection with other viruses.  The expected result is Negative.  Fact Sheet for Patients: SugarRoll.be  Fact Sheet for Healthcare Providers: https://www.woods-mathews.com/  This test is not yet approved or cleared by the [place] FDA and  has been authorized for detection and/or diagnosis of SARS-CoV-2 by FDA under an Emergency Use Authorization (EUA). This EUA will remain  in effect (meaning this test can be used) for the duration of the COVID-19 declaration under Section 564(b)(1) of the Act, 21 U. S.C. section 360bbb-3(b)(1), unless the authorization is terminated or revoked sooner.   Performed at [facility], [place] [address], [place], [place] [address]     Procedures and diagnostic studies:  CT CHEST WO CONTRAST  Result Date: 02/26/2020 CLINICAL DATA:  87 year old male with pleural effusion. Rule out malignancy. EXAM: CT CHEST WITHOUT CONTRAST TECHNIQUE: Multidetector CT imaging of the chest was performed following the standard protocol without IV contrast. COMPARISON:  Chest radiograph dated 02/23/2020. FINDINGS: Evaluation of this exam is limited in the absence of intravenous contrast. Cardiovascular: Mild cardiomegaly.  No pericardial effusion. Advanced 3 vessel coronary vascular calcification. There is calcification of the mitral annulus and aortic valve. Moderate atherosclerotic calcification of the thoracic aorta. No aneurysmal dilatation. The central pulmonary arteries are unremarkable. Mediastinum/Nodes: No hilar adenopathy. Upper mediastinal lymph node measures 11 mm in short axis to the right of the esophagus (27/3). The esophagus is grossly unremarkable. No mediastinal fluid collection. Lungs/Pleura: Moderate bilateral pleural effusions. There is consolidative changes of the majority of the left lower lobe and partial right lower lobe which may represent atelectasis or infiltrate. Underlying mass is not excluded. Bilateral upper lobe patchy and streaky densities most consistent with pneumonia, likely viral or atypical in etiology. Including COVID-19. Clinical correlation and follow-up to resolution recommended. No pneumothorax. Mucus secretions noted in the left mainstem bronchus extending into the left lower lobe bronchus. The central airways are otherwise patent. Upper Abdomen: No acute abnormality. Musculoskeletal: Osteopenia with degenerative changes of the spine. No acute osseous pathology. There is diffuse thoracic spine syndesmophyte, likely ankylosing spondylitis. IMPRESSION: 1. Moderate bilateral pleural effusions with consolidative changes of the majority of the left lower lobe and partial right lower lobe which may represent atelectasis or infiltrate. Underlying mass is not excluded. Clinical correlation and follow-up to resolution recommended. 2. Bilateral upper lobe patchy and streaky densities most consistent with pneumonia, likely viral or atypical in etiology. Including COVID-19. Clinical correlation and follow-up to resolution recommended. 3. Aortic Atherosclerosis (ICD10-I70.0). Electronically Signed   By: Anner Crete M.D.   On: 02/26/2020 19:21    Medications:   . apraclonidine  1 drop Left Eye  TID  . vitamin C  1,000 mg Oral Daily  . aspirin EC  81 mg Oral Daily  . bisacodyl  10 mg Rectal Once  . feeding supplement  237 mL Oral TID BM  . heparin  5,000 Units Subcutaneous Q8H  . labetalol  50  mg Oral BID  . latanoprost  1 drop Left Eye QHS  . levothyroxine  50 mcg Oral QAC breakfast  . loratadine  10 mg Oral Daily  . magnesium oxide  200 mg Oral Daily  . mouth rinse  15 mL Mouth Rinse BID  . melatonin  10 mg Oral QHS  . methylPREDNISolone (SOLU-MEDROL) injection  0.5 mg/kg Intravenous Q12H   Followed by  . [START ON 03/01/2020] predniSONE  50 mg Oral Daily  . mirtazapine  15 mg Oral QHS  . multivitamin with minerals  1 tablet Oral Daily  . pantoprazole  40 mg Oral BID  . polyethylene glycol  17 g Oral BID  . sucralfate  1 g Oral TID WC & HS  . tamsulosin  0.4 mg Oral QPC supper   Continuous Infusions: . sodium chloride 50 mL/hr at 02/27/20 1027  . cefTRIAXone (ROCEPHIN)  IV 1 g (02/27/20 1313)  . [START ON 02/28/2020] remdesivir 100 mg in NS 100 mL    . remdesivir 200 mg in sodium chloride 0.9% 250 mL IVPB       LOS: 7 days   Geradine Girt  [facility]   How to contact the [facility] Attending or Consulting provider [place] or covering provider during after hours [place], for this patient?  1. Check the care team in [facility] and look for a) attending/consulting TRH provider listed and b) the [facility] team listed 2. Log into [email or web address] and use [place]'s universal password to access. If you do not have the password, please contact the hospital operator. 3. Locate the [facility] provider you are looking for under [facility] and page to a number that you can be directly reached. 4. If you still have difficulty reaching the provider, please page the [facility] (Director on Call) for the Hospitalists listed on amion for assistance.  02/27/2020, 3:52 PM

## 2020-02-27 NOTE — Progress Notes (Signed)
Calorie Count Note: Day 2   48-hour calorie count ordered. Calorie count started 02/09 at lunch meal. Please see day 2 results below.   Diet: Regular Diet  Supplements: Ensure Enlive TID, Magic Cup TID   02/10 Lunch: 0 calories, 0 grams of protein 02/10 Dinner: 0 calories, 0 grams of protein 02/11 Breakfast: 148 calories, 5 grams of protein (0.25 Ensure Enlive, 1 cranberry juice)   Day 2 Total Intake: 148 calories (9.5% of minimum estimated needs) 5 grams of protein (7% of minimum estimated needs)   Nutrition Diagnosis: Moderate Malnutritionrelated to chronic illness (CKD stage 4)as evidenced by moderate muscle depletion,moderate fat depletion.  Goal: Patient will meet greater than or equal to 90% of their needs  Intervention:  -Diet liberalized to regular -d/c calorie count -Continue Ensure Enlive TID -Continue Magic Cup TID  -Continue MVI with minerals -recommend Cortrak placement if within goals of care (see tube feed recommendation below):  -initiate Osmolite 1.2 @ 20 mL/hr and increase by 10 mL every 4 hours to goal rate of 60 mL/hr  (1440 mL/day)  -free water flushes of 100 mL q 6 hours  Recommended tube feeding regimen at goal rate provides 1728 kcal, 80 g protein and 1162 mL of H20 (meets 100% of estimated kcal needs and 100% of estimated protein needs).   Total free water with flushes 1562 mL.   Alexander Mcdonald, Dietetic Intern 02/27/2020 1:40 PM

## 2020-02-27 NOTE — Progress Notes (Signed)
Occupational Therapy Treatment Patient Details Name: Alexander Mcdonald MRN: [id number] DOB: February 01, 1926 Today's Date: 02/27/2020    History of present illness The pt is a 87 yo male presenting from home with abnormal labs (WBC 23.8, Creatinine 3.56, BUN 83). Upon workup, pt found to have AKI on CKD IV, CAP. PMH includes: aortic stenosis with TAVR pending, glaucoma (legally blind, some sight at midline in L eye), HTN, CAD, HLD, postate cancer, and "unsteadiness"   OT comments  Pt making gradual progress towards OT goals this session. Pt presents today with decreased level of arousal, decreased activity tolerance and generalized deconditioning with increased WOB with RR 33 upon arrival, pt on RA upon arrival with sats reading at 70%. Applied O2 at 3L with sats increasing to 90%. Pt requiring more assist to transition to EOB, needing MAX A +1. MOD A for sitting balance EOB. Pt unable to tolerate sitting EOB >1 min. MOD to roll R<>L to change bed pad. Noted palliative consult in place, will keep DC plan same for now but will continue to follow pt acutely and update DC recs as pt progresses.    Follow Up Recommendations  Home health OT;Supervision/Assistance - 24 hour    Equipment Recommendations  None recommended by OT    Recommendations for Other Services      Precautions / Restrictions Precautions Precautions: Fall Precaution Comments: per daughter, has slid to floor during stand-sit multiple times in last year, legally blind but some vision at midline in L eye. Restrictions Weight Bearing Restrictions: No       Mobility Bed Mobility Overal bed mobility: Needs Assistance Bed Mobility: Rolling;Supine to Sit;Sit to Supine Rolling: Mod assist   Supine to sit: Max assist;HOB elevated Sit to supine: Max assist;HOB elevated   General bed mobility comments: MOD A to roll R<>L to change bed pad, MAX A +1 ( could really have used +2) to transition supine<>sit to pts L side. tactile cues needed  for hand placement d/t visual deficits. pt needed most assist to fully advance BLEs to EOB and elevate trunk into sitting  Transfers                 General transfer comment: NT d/t decreased level of arousal and increased assist needed to maintain sitting balance    Balance Overall balance assessment: Needs assistance;History of Falls Sitting-balance support: Bilateral upper extremity supported;Feet supported Sitting balance-Leahy Scale: Poor Sitting balance - Comments: UE supported and at least MOD A for static sitting balance, pt only able to tolerate ~ 1 min of sitting EOB       Standing balance comment: NT                           ADL either performed or assessed with clinical judgement   ADL Overall ADL's : Needs assistance/impaired       Grooming Details (indicate cue type and reason): total A to bring suction to mouth to manage secretions                     Toileting- Clothing Manipulation and Hygiene: Total assistance;Bed level Toileting - Clothing Manipulation Details (indicate cue type and reason): psoterior peicare from bed level     Functional mobility during ADLs: Maximal assistance (bed mobility only) General ADL Comments: pt presents with decreased activity tolerance, generalized weakness and impaired balance. pt only able to tolerate sitting EOB ~ 1 min before needing to return to  supine     Vision Baseline Vision/History: Legally blind Patient Visual Report: No change from baseline     Perception     Praxis      Cognition Arousal/Alertness: Lethargic;Awake/alert (moments of wakefulness and moments of lethargy. when pt not stimulated pt returns to sleeping) Behavior During Therapy: Flat affect Overall Cognitive Status: Difficult to assess                                 General Comments: difficult to assess d/t decreased level of arousal, pt making minimally efforts to verbalize and when pt attempts pt very  soft spoken and difficult to understand, pt with increased WOB with RR up to 33 at times. pt following commands with increased time but fatigues quickly with minimal actiivty        Exercises Other Exercises Other Exercises: PROM ankle pumps BLEs x10 reps Other Exercises: pt completed x10 straight leg raises on pts RLE but unable to complete on LLE d/t fatigue   Shoulder Instructions       General Comments pt with O2 turned off upon arrival and O2 probe off pts finger, donned probe with O2 70%. applied 3L via [place] with sats maintaining >90% during session. BP from supine 101/54. unable to take BP sitting EOB as pt unable tolerate sitting up for more than 1 min    Pertinent Vitals/ Pain       Pain Assessment: Faces Faces Pain Scale: Hurts a little bit Pain Location: general discomfort with mobility Pain Descriptors / Indicators: Grimacing;Moaning Pain Intervention(s): Monitored during session;Repositioned  Home Living                                          Prior Functioning/Environment              Frequency  Min 2X/week        Progress Toward Goals  OT Goals(current goals can now be found in the care plan section)  Progress towards OT goals: Not progressing toward goals - comment (decreased level of arousal)  Acute Rehab OT Goals Patient Stated Goal: to get stronger OT Goal Formulation: With patient Time For Goal Achievement: 03/07/20 Potential to Achieve Goals: Good  Plan Discharge plan remains appropriate;Frequency remains appropriate    Co-evaluation                 AM-PAC OT "6 Clicks" Daily Activity     Outcome Measure   Help from another person eating meals?: A Lot Help from another person taking care of personal grooming?: A Lot Help from another person toileting, which includes using toliet, bedpan, or urinal?: Total Help from another person bathing (including washing, rinsing, drying)?: Total Help from another person to put  on and taking off regular upper body clothing?: Total Help from another person to put on and taking off regular lower body clothing?: Total 6 Click Score: 8    End of Session    OT Visit Diagnosis: Unsteadiness on feet (R26.81);Other abnormalities of gait and mobility (R26.89);Muscle weakness (generalized) (M62.81);Other symptoms and signs involving cognitive function;Low vision, both eyes (H54.2)   Activity Tolerance Patient limited by fatigue;Patient limited by lethargy   Patient Left in bed;with call bell/phone within reach;with bed alarm set;with nursing/sitter in room;Other (comment) (palliative NP present)   Nurse Communication Mobility status;Other (comment)  Time: 1430-1500 OT Time Calculation (min): 30 min  Charges: OT General Charges $OT Visit: 1 Visit OT Treatments $Self Care/Home Management : 23-37 mins  Harley Alto., COTA/L Acute Rehabilitation Services [phone]    Precious Haws 02/27/2020, 3:16 PM

## 2020-02-28 DIAGNOSIS — J918 Pleural effusion in other conditions classified elsewhere: Secondary | ICD-10-CM

## 2020-02-28 DIAGNOSIS — I350 Nonrheumatic aortic (valve) stenosis: Secondary | ICD-10-CM | POA: Diagnosis not present

## 2020-02-28 DIAGNOSIS — Z515 Encounter for palliative care: Secondary | ICD-10-CM

## 2020-02-28 DIAGNOSIS — J189 Pneumonia, unspecified organism: Secondary | ICD-10-CM

## 2020-02-28 DIAGNOSIS — E440 Moderate protein-calorie malnutrition: Secondary | ICD-10-CM | POA: Diagnosis not present

## 2020-02-28 DIAGNOSIS — Z66 Do not resuscitate: Secondary | ICD-10-CM

## 2020-02-28 DIAGNOSIS — N184 Chronic kidney disease, stage 4 (severe): Secondary | ICD-10-CM | POA: Diagnosis not present

## 2020-02-28 DIAGNOSIS — N179 Acute kidney failure, unspecified: Secondary | ICD-10-CM | POA: Diagnosis not present

## 2020-02-28 LAB — CBC WITH DIFFERENTIAL/PLATELET
Abs Immature Granulocytes: 2.03 10*3/uL — ABNORMAL HIGH (ref 0.00–0.07)
Basophils Absolute: 0.1 10*3/uL (ref 0.0–0.1)
Basophils Relative: 0 %
Eosinophils Absolute: 0 10*3/uL (ref 0.0–0.5)
Eosinophils Relative: 0 %
HCT: 24.9 % — ABNORMAL LOW (ref 39.0–52.0)
Hemoglobin: 7.5 g/dL — ABNORMAL LOW (ref 13.0–17.0)
Immature Granulocytes: 6 %
Lymphocytes Relative: 8 %
Lymphs Abs: 2.6 10*3/uL (ref 0.7–4.0)
MCH: 30.7 pg (ref 26.0–34.0)
MCHC: 30.1 g/dL (ref 30.0–36.0)
MCV: 102 fL — ABNORMAL HIGH (ref 80.0–100.0)
Monocytes Absolute: 0.6 10*3/uL (ref 0.1–1.0)
Monocytes Relative: 2 %
Neutro Abs: 29.8 10*3/uL — ABNORMAL HIGH (ref 1.7–7.7)
Neutrophils Relative %: 84 %
Platelets: 329 10*3/uL (ref 150–400)
RBC: 2.44 MIL/uL — ABNORMAL LOW (ref 4.22–5.81)
RDW: 18.1 % — ABNORMAL HIGH (ref 11.5–15.5)
WBC: 35.1 10*3/uL — ABNORMAL HIGH (ref 4.0–10.5)
nRBC: 0.9 % — ABNORMAL HIGH (ref 0.0–0.2)

## 2020-02-28 LAB — COMPREHENSIVE METABOLIC PANEL
ALT: 176 U/L — ABNORMAL HIGH (ref 0–44)
AST: 156 U/L — ABNORMAL HIGH (ref 15–41)
Albumin: 2.8 g/dL — ABNORMAL LOW (ref 3.5–5.0)
Alkaline Phosphatase: 203 U/L — ABNORMAL HIGH (ref 38–126)
Anion gap: 19 — ABNORMAL HIGH (ref 5–15)
BUN: 130 mg/dL — ABNORMAL HIGH (ref 8–23)
CO2: 18 mmol/L — ABNORMAL LOW (ref 22–32)
Calcium: 9.7 mg/dL (ref 8.9–10.3)
Chloride: 103 mmol/L (ref 98–111)
Creatinine, Ser: 4.04 mg/dL — ABNORMAL HIGH (ref 0.61–1.24)
GFR, Estimated: 13 mL/min — ABNORMAL LOW (ref >60)
Glucose, Bld: 182 mg/dL — ABNORMAL HIGH (ref 70–99)
Potassium: 6.1 mmol/L — ABNORMAL HIGH (ref 3.5–5.1)
Sodium: 140 mmol/L (ref 135–145)
Total Bilirubin: 0.8 mg/dL (ref 0.3–1.2)
Total Protein: 5.4 g/dL — ABNORMAL LOW (ref 6.5–8.1)

## 2020-02-28 LAB — C-REACTIVE PROTEIN: CRP: 3 mg/dL — ABNORMAL HIGH (ref <1.0)

## 2020-02-28 LAB — D-DIMER, QUANTITATIVE: D-Dimer, Quant: 2.02 ug/mL-FEU — ABNORMAL HIGH (ref 0.00–0.50)

## 2020-02-28 LAB — FERRITIN: Ferritin: 1484 ng/mL — ABNORMAL HIGH (ref 24–336)

## 2020-02-28 MED ORDER — LORAZEPAM 2 MG/ML IJ SOLN
0.2500 mg | Freq: Two times a day (BID) | INTRAMUSCULAR | Status: DC
  Administered 2020-02-28: 0.25 mg via INTRAVENOUS
  Filled 2020-02-28 (×2): qty 1

## 2020-02-28 MED ORDER — HYDROMORPHONE HCL 1 MG/ML IJ SOLN
0.2500 mg | INTRAMUSCULAR | Status: DC | PRN
Start: 2020-02-28 — End: 2020-03-01
  Administered 2020-02-28: 0.5 mg via INTRAVENOUS
  Administered 2020-02-29: 0.25 mg via INTRAVENOUS
  Administered 2020-02-29: 0.5 mg via INTRAVENOUS
  Filled 2020-02-28 (×4): qty 0.5

## 2020-02-28 MED ORDER — LORAZEPAM 2 MG/ML IJ SOLN: 0.5000 mg | INTRAMUSCULAR | Status: DC | PRN

## 2020-02-28 MED ORDER — GLYCOPYRROLATE 0.2 MG/ML IJ SOLN: 0.2000 mg | Freq: Two times a day (BID) | INTRAMUSCULAR | Status: DC

## 2020-02-28 MED ORDER — HALOPERIDOL LACTATE 5 MG/ML IJ SOLN: 0.5000 mg | INTRAMUSCULAR | Status: DC | PRN

## 2020-02-28 MED ORDER — HALOPERIDOL 0.5 MG PO TABS
0.5000 mg | ORAL_TABLET | ORAL | Status: DC | PRN
  Filled 2020-02-28: qty 1

## 2020-02-28 MED ORDER — BIOTENE DRY MOUTH MT LIQD: 15.0000 mL | OROMUCOSAL | Status: DC | PRN

## 2020-02-28 MED ORDER — GLYCOPYRROLATE 1 MG PO TABS
1.0000 mg | ORAL_TABLET | ORAL | Status: DC | PRN
  Filled 2020-02-28: qty 1

## 2020-02-28 MED ORDER — PANTOPRAZOLE SODIUM 40 MG IV SOLR
40.0000 mg | Freq: Two times a day (BID) | INTRAVENOUS | Status: DC
  Administered 2020-02-28 – 2020-02-29 (×3): 40 mg via INTRAVENOUS
  Filled 2020-02-28 (×3): qty 40

## 2020-02-28 MED ORDER — GLYCOPYRROLATE 0.2 MG/ML IJ SOLN: 0.2000 mg | INTRAMUSCULAR | Status: DC | PRN

## 2020-02-28 MED ORDER — HALOPERIDOL LACTATE 2 MG/ML PO CONC
0.5000 mg | ORAL | Status: DC | PRN
  Filled 2020-02-28: qty 0.3

## 2020-02-28 MED ORDER — MAGIC MOUTHWASH W/LIDOCAINE
10.0000 mL | Freq: Three times a day (TID) | ORAL | Status: DC
  Filled 2020-02-28 (×3): qty 10

## 2020-02-28 MED ORDER — GLYCOPYRROLATE 0.2 MG/ML IJ SOLN
0.4000 mg | Freq: Three times a day (TID) | INTRAMUSCULAR | Status: DC
  Administered 2020-02-28 – 2020-02-29 (×4): 0.4 mg via INTRAVENOUS
  Filled 2020-02-28 (×4): qty 2

## 2020-02-28 MED ORDER — POLYVINYL ALCOHOL 1.4 % OP SOLN
1.0000 [drp] | Freq: Four times a day (QID) | OPHTHALMIC | Status: DC | PRN
Start: 2020-02-28 — End: 2020-03-01
  Filled 2020-02-28: qty 15

## 2020-02-28 NOTE — Progress Notes (Signed)
Patient is awake in bed.  Responds verbally when asked if he is comfortable.  Repositioned for comfort. Gurgling, mouth suctioned. No family at bedside at this time. Asked patient if he was hurting, patient responded "no". Advised patient that staff would be back in an hour to check on him again.

## 2020-02-28 NOTE — Progress Notes (Signed)
SLP Cancellation Note  Patient Details Name: Alexander Mcdonald MRN: [id number] DOB: 08/16/1926   Cancelled treatment:       Reason Eval/Treat Not Completed: Other (comment). Received new order for swallow eval. Discussed with PA. Eval previously completed on 02/26/20. Will defer reeval at this time.    Yi Haugan, Katherene Ponto 02/28/2020, 10:12 AM

## 2020-02-28 NOTE — Progress Notes (Signed)
Progress Note    Alexander Mcdonald  CVE:[id number] DOB: 05-10-1926  DOA: 02/25/2020 PCP: Isaac Bliss, Rayford Halsted, MD    Brief Narrative:     Medical records reviewed and are as summarized below:  Alexander Mcdonald is an 87 y.o. male with medical history significant ofhistory of HTN, bladder spasm, BPH, bradycardia, history of PVCs, carotid artery disease, gout, glaucoma, thrombosed hemorrhoid, hyperlipidemia, hypertension, prostate cancer, rosacea, unsteadiness. Patient presented secondary to an elevated creatinine and found to have an AKI in addition to a LLL pneumonia. Nephrology consulted. IV antibiotics initiated. Not improving over last 6 days.  Per nursing, not eating much since admission.  Poor overall prognosis.  + COVID 2/11   Assessment/Plan:   Principal Problem:   Acute renal failure superimposed on stage 4 chronic kidney disease, unspecified acute renal failure type ([place]) Active Problems:   HYPERCHOLESTEROLEMIA   Essential hypertension   CAP (community acquired pneumonia)   Hyponatremia   Hypothyroidism   Aortic stenosis   Glaucoma   Malnutrition of moderate degree   Severe sepsis without septic shock ([place])   COVID 19 + -check inflammatory markers daily -steroids -remdesivir  AKI on CKD stage IV Baseline creatinine of about 2.2-2.4. Creatinine of 4.43 on admission. In setting of recent heart catheterization. Given IV fluids on admission. No hydronephrosis on CT abdomen/pelvis. Improving slowly -Daily CMP--  Await labs from today  Community acquired pneumonia LLL pneumonia seen on CT imaging. Patient underwent thoracentesis on 2/5 yielding 400 mL of clear yellow fluid; fluid analysis suggest probable transudate but difficult to be exact secondary to undetectable protein in pleural fluid. Strep/legionella negative. No sputum culture available. Pleural fluid culture (2/5) with no growth to date. Cytology with no evidence of malignancy. -ID consult per family  request: start meropenem x 5 days, sputum culture if able, will need outpatient CT chest repeat in 4-6 weeks, can consider hematologic work up - CT scan of chest :  1.  Moderate bilateral pleural effusions with consolidative changes of the majority of the left lower lobe and partial right lower lobe which may represent atelectasis or infiltrate. Underlying mass is not excluded. Clinical correlation and follow-up to resolution recommended. 2. Bilateral upper lobe patchy and streaky densities most consistent with pneumonia, likely viral or atypical in etiology. Including COVID-19. Clinical correlation and follow-up to resolution Recommended.  Acute respiratory failure with hypoxia Likely secondary to pneumonia. SpO2 as low as 87% on room air on admission. Initially placed on 2 lpm of oxygen and weaned to room air. Overnight on 2/6, patient developed some dyspnea concern for fluid overload. BNP significantly elevated. Given Lasix with improvement. -Continue oxygen and wean to room air as able -Keep SpO2 > 92% -incentive spirometry -mobilize  Acute urinary retention Unknown etiology. Foley catheter placed on 2/4. Urinalysis does not suggest infection, but does show pyuria. Urine cultures (2/4 and 2/5) with multiple species. -foley d/c'd  Chronic diastolic heart failure -hold home lasix while PO intake poor  Bradycardia/Tachycardia Bradycardia resolved after discontinuing labetalol then developed tachycardia -Continue Telemetry -Continue home labetalol at 50 mg BID (half dose)  Elevated AST/ALT/ALP Unknown etiology. CT abdomen/pelvis and RUQ ultrasound without any biliary pathology noted. No associated clinical symptoms. Normal bilirubin -d/c statin as trending up  Hyponatremia Resolved.  Hyperlipidemia -Continue Lipitor  Hyperkalemia -await AM labs  Aortic stenosis -seen by cards on 2/8: After careful consideration of his multiple co-morbidities including advanced  age, CKD stage 4, poor functional status and severe AS he  is not felt to be a candidate for TAVR. We will plan to arrange cardiac follow with Dr. Sallyanne Kuster after his current hospitalization and will attempt medical management with careful diuresis. I would suspect that he will need to be discharged on Lasix, possibly the 40 mg three times weekly dosage as before. It would be reasonable to consider a palliative care consultation at this time.   Constipation Improved with dulcolax suppository -Continue Miralax BID  Primary hypertension -Continue hydralazine, labetalol  Hypothyroidism -Continue Synthroid  Severe erosive esophagitis -was supposed to have taken protonix and carafate in 2019 s/p EGD but did not -protonix while here  Glaucoma -Continue apraclonidine, latanoprost  Nutrition Status: Nutrition Problem: Moderate Malnutrition Etiology: chronic illness (CKD stage 4) Signs/Symptoms: moderate muscle depletion,moderate fat depletion Interventions: Ensure Enlive (each supplement provides 350kcal and 20 grams of protein),Magic cup,MVI  Pending plan with palliative, will need tube feeds if PO intake does not improve Will ask for SLP eval as he appears to be aspirating with every sip of fluid-- daughter at bedside and patient appears to be more awake than prior for better eval   Delirium precautions palliative care consult Poor overall prognosis- long discussion with daughter that I fear he will continue to de-compensate.  Family Communication/Anticipated D/C date and plan/Code Status   DVT prophylaxis: heparin Code Status: Full Code.  Family Communication:  Daughter at bedside Disposition Plan: Status is: Inpatient  Remains inpatient appropriate because:Inpatient level of care appropriate due to severity of illness   Dispo: The patient is from: Home              Anticipated d/c is to: tbd              Anticipated d/c date is: unclear              Patient currently is  not medically stable to d/c.   Difficult to place patient No         Medical Consultants:   Cards Renal Palliative care ID    Subjective:   Coughing with every sip of liquid, he is more interactive with daughter at bedside  Objective:    Vitals:   02/27/20 2100 02/28/20 0000 02/28/20 0554 02/28/20 0617  BP:  (!) 114/57  (!) 113/33  Pulse:  75  62  Resp:  20  14  Temp: 97.8 F (36.6 C) 97.9 F (36.6 C)  98 F (36.7 C)  TempSrc: Oral Oral  Axillary  SpO2:  97%  99%  Weight:   70.1 kg   Height:       No intake or output data in the 24 hours ending 02/28/20 1236 Filed Weights   02/26/20 1219 02/27/20 0400 02/28/20 0554  Weight: 68.7 kg 69.1 kg 70.1 kg    Exam:  General: Appearance:     Frail male who appears weak      Lungs:     Wet sounding voice, coarse breath sounds  Heart:    Normal heart rate. Normal rhythm. +murmur   MS:   All extremities are intact.   Neurologic:   Awake, alert- moves all 4 ext    Data Reviewed:   I have personally reviewed following labs and imaging studies:  Labs: Labs show the following:   Basic Metabolic Panel: Recent Labs  Lab 02/22/20 0150 02/23/20 0029 02/24/20 0052 02/25/20 0027 02/26/20 0537 02/27/20 0531  NA 138 140 145 143 141 139  K 3.4* 3.7 3.6 3.8 4.5 5.4*  CL 108 111  110 107 104 104  CO2 18* 17* 24 24 24  21*  GLUCOSE 145* 198* 204* 123* 119* 173*  BUN 86* 70* 71* 66* 75* 96*  CREATININE 3.07* 2.61* 2.43* 2.21* 2.34* 2.87*  CALCIUM 9.2 8.8* 9.6 10.0 10.5* 10.5*  MG 2.6*  --   --   --   --   --    GFR Estimated Creatinine Clearance: 14.6 mL/min (A) (by C-G formula based on SCr of 2.87 mg/dL (H)). Liver Function Tests: Recent Labs  Lab 02/23/20 0029 02/24/20 0052 02/25/20 0027 02/26/20 0537 02/27/20 0531  AST 148* 175* 400* 107* 81*  ALT 127* 181* 339* 225* 159*  ALKPHOS 231* 237* 308* 271* 244*  BILITOT 0.9 0.9 0.7 0.9 1.1  PROT 5.1* 5.0* 4.7* 5.2* 5.3*  ALBUMIN 2.2* 2.2* 2.3* 2.5* 2.6*    No results for input(s): LIPASE, AMYLASE in the last 168 hours. No results for input(s): AMMONIA in the last 168 hours. Coagulation profile No results for input(s): INR, PROTIME in the last 168 hours.  CBC: Recent Labs  Lab 02/22/20 0150 02/23/20 0029 02/24/20 0052 02/25/20 0027 02/26/20 0537 02/27/20 0531 02/28/20 0948  WBC 18.9*   < > 19.0* 18.2* 22.1* 26.7* 35.1*  NEUTROABS 15.9*  --   --   --   --  19.9* 29.8*  HGB 9.1*   < > 7.9* 8.0* 7.8* 7.5* 7.5*  HCT 26.5*   < > 23.9* 24.6* 25.2* 24.6* 24.9*  MCV 89.2   < > 91.9 93.5 97.7 98.4 102.0*  PLT 302   < > 290 288 297 370 329   < > = values in this interval not displayed.   Cardiac Enzymes: No results for input(s): CKTOTAL, CKMB, CKMBINDEX, TROPONINI in the last 168 hours. BNP (last 3 results) No results for input(s): PROBNP in the last 8760 hours. CBG: No results for input(s): GLUCAP in the last 168 hours. D-Dimer: Recent Labs    02/27/20 1615 02/28/20 0948  DDIMER 1.82* 2.02*   Hgb A1c: No results for input(s): HGBA1C in the last 72 hours. Lipid Profile: No results for input(s): CHOL, HDL, LDLCALC, TRIG, CHOLHDL, LDLDIRECT in the last 72 hours. Thyroid function studies: No results for input(s): TSH, T4TOTAL, T3FREE, THYROIDAB in the last 72 hours.  Invalid input(s): FREET3 Anemia work up: Recent Labs    02/27/20 1615  FERRITIN 1,241*   Sepsis Labs: Recent Labs  Lab 02/25/20 0027 02/26/20 0537 02/27/20 0531 02/27/20 1912 02/28/20 0948  PROCALCITON  --   --   --  0.51  --   WBC 18.2* 22.1* 26.7*  --  35.1*    Microbiology Recent Results (from the past 240 hour(s))  Blood culture (routine single)     Status: None   Collection Time: 02/18/2020  1:56 PM   Specimen: BLOOD LEFT HAND  Result Value Ref Range Status   Specimen Description BLOOD LEFT HAND  Final   Special Requests   Final    BOTTLES DRAWN AEROBIC ONLY Blood Culture results may not be optimal due to an inadequate volume of blood received  in culture bottles   Culture   Final    NO GROWTH 5 DAYS Performed at [facility], [place] [address], [place], [place] [address]    Report Status 02/24/2020 FINAL  Final  SARS Coronavirus 2 by RT PCR (hospital order, performed in [facility] hospital lab) Nasopharyngeal Nasopharyngeal Swab     Status: None   Collection Time: 02/24/2020  2:41 PM  Specimen: Nasopharyngeal Swab  Result Value Ref Range Status   SARS Coronavirus 2 NEGATIVE NEGATIVE Final    Comment: (NOTE) SARS-CoV-2 target nucleic acids are NOT DETECTED.  The SARS-CoV-2 RNA is generally detectable in upper and lower respiratory specimens during the acute phase of infection. The lowest concentration of SARS-CoV-2 viral copies this assay can detect is 250 copies / mL. A negative result does not preclude SARS-CoV-2 infection and should not be used as the sole basis for treatment or other patient management decisions.  A negative result may occur with improper specimen collection / handling, submission of specimen other than nasopharyngeal swab, presence of viral mutation(s) within the areas targeted by this assay, and inadequate number of viral copies (<250 copies / mL). A negative result must be combined with clinical observations, patient history, and epidemiological information.  Fact Sheet for Patients:   StrictlyIdeas.no  Fact Sheet for Healthcare Providers: BankingDealers.co.za  This test is not yet approved or  cleared by the [place] FDA and has been authorized for detection and/or diagnosis of SARS-CoV-2 by FDA under an Emergency Use Authorization (EUA).  This EUA will remain in effect (meaning this test can be used) for the duration of the COVID-19 declaration under Section 564(b)(1) of the Act, 21 U.S.C. section 360bbb-3(b)(1), unless the authorization is terminated or revoked sooner.  Performed at [facility], [place] [address], [place],  [place] [address]   MRSA PCR Screening     Status: None   Collection Time: 03/01/2020  7:52 PM   Specimen: Nasopharyngeal  Result Value Ref Range Status   MRSA by PCR NEGATIVE NEGATIVE Final    Comment:        The GeneXpert MRSA Assay (FDA approved for NASAL specimens only), is one component of a comprehensive MRSA colonization surveillance program. It is not intended to diagnose MRSA infection nor to guide or monitor treatment for MRSA infections. Performed at [facility], [place] [address], [place], [place] [address]   Urine culture     Status: Abnormal   Collection Time: 02/20/20 11:42 AM   Specimen: In/Out Cath Urine  Result Value Ref Range Status   Specimen Description IN/OUT CATH URINE  Final   Special Requests   Final    NONE Performed at [facility], [place] [address], [place], [place] [address]    Culture MULTIPLE SPECIES PRESENT, SUGGEST RECOLLECTION (A)  Final   Report Status 02/21/2020 FINAL  Final  Gram stain     Status: None   Collection Time: 02/21/20 11:14 AM   Specimen: PATH Cytology Pleural fluid  Result Value Ref Range Status   Specimen Description PLEURAL FLUID LEFT  Final   Special Requests NONE  Final   Gram Stain   Final    ABUNDANT WBC PRESENT,BOTH PMN AND MONONUCLEAR NO ORGANISMS SEEN Performed at [facility], [place] [address], [place], [place] [address]    Report Status 02/21/2020 FINAL  Final  Culture, body fluid-bottle     Status: None   Collection Time: 02/21/20 11:14 AM   Specimen: Pleura  Result Value Ref Range Status   Specimen Description PLEURAL FLUID LEFT  Final   Special Requests NONE  Final   Culture   Final    NO GROWTH 5 DAYS Performed at [place] [address] [place], [place] [address]    Report Status 02/26/2020 FINAL  Final  Culture, Urine     Status: Abnormal   Collection Time:  02/21/20  7:52 PM   Specimen: Urine, Catheterized  Result Value Ref Range Status   Specimen Description URINE, CATHETERIZED   Final   Special Requests   Final    STERILE CUP Performed at [facility], [address] [place], [place] [address]    Culture MULTIPLE SPECIES PRESENT, SUGGEST RECOLLECTION (A)  Final   Report Status 02/23/2020 FINAL  Final  SARS CORONAVIRUS 2 (TAT 6-24 HRS) Nasopharyngeal Nasopharyngeal Swab     Status: Abnormal   Collection Time: 02/27/20  7:45 AM   Specimen: Nasopharyngeal Swab  Result Value Ref Range Status   SARS Coronavirus 2 POSITIVE (A) NEGATIVE Final    Comment: (NOTE) SARS-CoV-2 target nucleic acids are DETECTED.  The SARS-CoV-2 RNA is generally detectable in upper and lower respiratory specimens during the acute phase of infection. Positive results are indicative of the presence of SARS-CoV-2 RNA. Clinical correlation with patient history and other diagnostic information is  necessary to determine patient infection status. Positive results do not rule out bacterial infection or co-infection with other viruses.  The expected result is Negative.  Fact Sheet for Patients: SugarRoll.be  Fact Sheet for Healthcare Providers: https://www.woods-mathews.com/  This test is not yet approved or cleared by the [place] FDA and  has been authorized for detection and/or diagnosis of SARS-CoV-2 by FDA under an Emergency Use Authorization (EUA). This EUA will remain  in effect (meaning this test can be used) for the duration of the COVID-19 declaration under Section 564(b)(1) of the Act, 21 U. S.C. section 360bbb-3(b)(1), unless the authorization is terminated or revoked sooner.   Performed at [facility], [place] [address], [place], [place] [address]     Procedures and diagnostic studies:  CT CHEST WO CONTRAST  Result Date: 02/26/2020 CLINICAL DATA:  87 year old male with pleural effusion. Rule out malignancy. EXAM: CT CHEST WITHOUT CONTRAST TECHNIQUE: Multidetector CT imaging of the chest was performed following the  standard protocol without IV contrast. COMPARISON:  Chest radiograph dated 02/23/2020. FINDINGS: Evaluation of this exam is limited in the absence of intravenous contrast. Cardiovascular: Mild cardiomegaly. No pericardial effusion. Advanced 3 vessel coronary vascular calcification. There is calcification of the mitral annulus and aortic valve. Moderate atherosclerotic calcification of the thoracic aorta. No aneurysmal dilatation. The central pulmonary arteries are unremarkable. Mediastinum/Nodes: No hilar adenopathy. Upper mediastinal lymph node measures 11 mm in short axis to the right of the esophagus (27/3). The esophagus is grossly unremarkable. No mediastinal fluid collection. Lungs/Pleura: Moderate bilateral pleural effusions. There is consolidative changes of the majority of the left lower lobe and partial right lower lobe which may represent atelectasis or infiltrate. Underlying mass is not excluded. Bilateral upper lobe patchy and streaky densities most consistent with pneumonia, likely viral or atypical in etiology. Including COVID-19. Clinical correlation and follow-up to resolution recommended. No pneumothorax. Mucus secretions noted in the left mainstem bronchus extending into the left lower lobe bronchus. The central airways are otherwise patent. Upper Abdomen: No acute abnormality. Musculoskeletal: Osteopenia with degenerative changes of the spine. No acute osseous pathology. There is diffuse thoracic spine syndesmophyte, likely ankylosing spondylitis. IMPRESSION: 1. Moderate bilateral pleural effusions with consolidative changes of the majority of the left lower lobe and partial right lower lobe which may represent atelectasis or infiltrate. Underlying mass is not excluded. Clinical correlation and follow-up to resolution recommended. 2. Bilateral upper lobe patchy and streaky densities most consistent with pneumonia, likely viral or atypical in etiology. Including COVID-19. Clinical correlation and  follow-up to  resolution recommended. 3. Aortic Atherosclerosis (ICD10-I70.0). Electronically Signed   By: Anner Crete M.D.   On: 02/26/2020 19:21    Medications:   . apraclonidine  1 drop Left Eye TID  . vitamin C  1,000 mg Oral Daily  . aspirin EC  81 mg Oral Daily  . bisacodyl  10 mg Rectal Once  . feeding supplement  237 mL Oral TID BM  . heparin  5,000 Units Subcutaneous Q8H  . labetalol  50 mg Oral BID  . latanoprost  1 drop Left Eye QHS  . levothyroxine  50 mcg Oral QAC breakfast  . loratadine  10 mg Oral Daily  . magic mouthwash w/lidocaine  10 mL Oral TID  . magnesium oxide  200 mg Oral Daily  . mouth rinse  15 mL Mouth Rinse BID  . melatonin  10 mg Oral QHS  . methylPREDNISolone (SOLU-MEDROL) injection  0.5 mg/kg Intravenous Q12H   Followed by  . [START ON 03/01/2020] predniSONE  50 mg Oral Daily  . mirtazapine  15 mg Oral QHS  . multivitamin with minerals  1 tablet Oral Daily  . pantoprazole (PROTONIX) IV  40 mg Intravenous Q12H  . polyethylene glycol  17 g Oral BID  . tamsulosin  0.4 mg Oral QPC supper   Continuous Infusions: . meropenem (MERREM) IV 1 g (02/27/20 2037)  . remdesivir 100 mg in NS 100 mL 100 mg (02/28/20 0947)     LOS: 8 days   Geradine Girt  [facility]   How to contact the [facility] Attending or Consulting provider [place] or covering provider during after hours [place], for this patient?  1. Check the care team in [facility] and look for a) attending/consulting TR[facility] provider listed and b) the [facility] team listed 2. Log into [email or web address] and use [place]'s universal password to access. If you do not have the password, please contact the hospital operator. 3. Locate the [facility] provider you are looking for under [facility] and page to a number that you can be directly reached. 4. If you still have difficulty reaching the provider, please page the [facility] (Director on Call) for the Hospitalists listed on amion for assistance.  02/28/2020, 12:36 PM

## 2020-02-28 NOTE — Progress Notes (Signed)
Received a late afternoon call from daughter [name].  She mentions that patient is telling her in multiple ways that he is dying "I wont be here tomorrow".  He was having frank gurgling on exam.  He is incredibly weak and does not feel like he is breathing well.  After some discussion and thought I expressed my strong feelings to [name] that Mr. Fletchall is likely correct - he is dying.  I recommend an immediate shift to comfort measures in order to allow his family members in to see him.  I expressed to [name] that we will keep the antibiotics, steroids, and remdesivir going as comfort measures - but his CT chest looks absolutely horrible, he is incredibly weak, aspirating, gurgling and having difficulty breathing.  He is clearly dying.  [name] was in agreement.  She stated that if we end up getting him home with hospice she prefers [facility].  In the meantime we will allow family in to visit.  Discussed with Dr. Eliseo Squires who is completely in agreement with the plan.  Discussed with bedside RN who is also very supportive of the plan.  Plan:  Comfort measures.  Will continue remdesivir, steroids and antibiotics for now.   Allow family to visit.  Refer to [place] in the am.  Disposition:  Hospital death would not be surprising.  Maybe there is a chance we can get him home with hospice.  Prognosis:  Hours to days.  Florentina Jenny, PA-C Palliative Medicine Office:  [phone]

## 2020-02-29 DIAGNOSIS — J1282 Pneumonia due to coronavirus disease 2019: Secondary | ICD-10-CM

## 2020-02-29 DIAGNOSIS — N179 Acute kidney failure, unspecified: Secondary | ICD-10-CM | POA: Diagnosis not present

## 2020-02-29 DIAGNOSIS — Z515 Encounter for palliative care: Secondary | ICD-10-CM | POA: Diagnosis not present

## 2020-02-29 DIAGNOSIS — U071 COVID-19: Secondary | ICD-10-CM | POA: Diagnosis not present

## 2020-02-29 DIAGNOSIS — N184 Chronic kidney disease, stage 4 (severe): Secondary | ICD-10-CM | POA: Diagnosis not present

## 2020-02-29 MED ORDER — LORAZEPAM 2 MG/ML IJ SOLN: 0.2500 mg | INTRAMUSCULAR | Status: DC | PRN

## 2020-03-01 DIAGNOSIS — U071 COVID-19: Secondary | ICD-10-CM

## 2020-03-16 NOTE — Progress Notes (Signed)
Pt's daughter reported that pt is not breathing.  Checked carotid pulse, no pulse palpated Time of death was 16:40 pm Notified Dr. Eliseo Squires.

## 2020-03-16 NOTE — Progress Notes (Addendum)
Daily Progress Note   Patient Name: Alexander Mcdonald       Date: 21-Mar-2020 DOB: Mar 03, 1926  Age: 87 y.o. MRN#: [id number] Attending Physician: Geradine Girt, DO Primary Care Physician: Isaac Bliss, Rayford Halsted, MD Admit Date: 02/18/2020  Late day addendum:  Re-examined the patient late afternoon.  Spoke with Alexander Mcdonald afterward.  Patient looks very peaceful but is clearly dying.  Discussed discontinuing the steroids, remdesivir, and antibiotics with Alexander Mcdonald.  She understands they are not helping her father at this point.  I explained they are merely adding more fluid.  Also discussed reducing the oxygen at some point after family has visited.   Last family member is expected at 10:30 this evening.  Discussed situation with Agricultural consultant.  Would encourage night shift to be generous with PRNs.  Family does not want to see Mr. Cooler suffering.    Reason for Consultation/Follow-up: symptom management To discuss complex medical decision making related to patient's goals of care  Patient is diagonal in bed and less responsive to me.  I repositioned him.  He barely opened his eyes but did not speak in response to my greeting and questions.   Breathing is mildly labored.  Likely needs a PRN dose of opioid.  Subjective: Spoke with daughter Alexander Mcdonald on the phone.  Explained that he has progressed and is dying.  We discussed whether or not to try to get him home.  I advised against it as at this point I believe an ambulance ride would be too much for him.   I expressed that we could re-examine him later in the day and re-assess.  We discussed the medications he is receiving for comfort.  She states the ativan he received helped reduce his anxiety last evening and he was breathing more easily.  We discussed  prognosis of hours to at best days.  I explained that I would lean more towards hours.  This surprised Alexander Mcdonald so I said I would see him again later today and we could reassess together.  Alexander Mcdonald questioned what is killing him?  A combination of heart disease, chronic kidney disease and then COVID.  I reminded her that we reviewed the CT together yesterday - his lungs looked very bad.  She expressed regret about several things - what if we had caught the COVID  earlier?  What if we had not done the cardiac cath?  I have not been as nice to him lately as I wish I had been -   I explained to her that he likely would have have months if it wasn't for COVID.  Catching it earlier would not have helped.  She has lived with him and taken great care of him for 4 years.  She has done an AMAZING job.  His quality of life up until this illness was excellent for a 87 yo gentleman.  I expressed that she should feel very very good about the care she has provided for him.  Alexander Mcdonald will be in the hospital in the next few minutes.  Hopefully family will visit today.   Assessment: Actively dying.  Secretions improved.  Generally comfortable.  Slightly labored breathing.   Patient Profile/HPI:  87 y.o. male  with past medical history of prostate cancer carotid artery disease, bradycardia, severe aortic stenosis, CKD stage IV, glaucoma, gout, severe erosive esophagitis who was admitted on 02/20/2020 with abnormal labs.  On admission he was found to have acute on chronic kidney failure, pneumonia, and hyponatremia.  He has received hospital care for 9 days as of the time of this consult.  He has not improved.  Unfortunately his p.o. intake has been very poor.  Yesterday on 02/27/2020 he was found to be positive for Covid.  CT scan done 02/25/2010 showed bilateral pleural effusions and bilateral patchy infiltrates consistent with atypical pneumonia.    Length of Stay: 9   Vital Signs: BP (!) 82/39 (BP Location: Left Arm)    Pulse 63   Temp 97.6 F (36.4 C)   Resp 16   Ht 5\' 5"  (1.651 m)   Wt 70.1 kg   SpO2 96%   BMI 25.72 kg/m  SpO2: SpO2: 96 % O2 Device: O2 Device: Nasal Cannula O2 Flow Rate: O2 Flow Rate (L/min): 2 L/min       Palliative Assessment/Data:  10%     Palliative Care Plan    Recommendations/Plan:  I doubt he is able to swallow at this point.  DC'd oral medications.  Actively dying.  Continue comfort care.  LOW threshold to start a gtt for comfort.  Holding off as family is coming in - trying to balance wakefulness with comfort.  Advise against transferring home at this point.  Code Status:  DNR  Prognosis:   Hours - Days   Discharge Planning:  Anticipated Hospital Death vs possible home with hospice (but doubtful)  Care plan was discussed with daughter Alexander Mcdonald  Thank you for allowing the Palliative Medicine Team to assist in the care of this patient.  Total time spent:  35 min.     Greater than 50%  of this time was spent counseling and coordinating care related to the above assessment and plan.  Florentina Jenny, PA-C Palliative Medicine  Please contact Palliative MedicineTeam phone at [phone] for questions and concerns between 7 am - 7 pm.   Please see AMION for individual provider pager numbers.

## 2020-03-16 NOTE — Progress Notes (Signed)
Progress Note    Alexander Mcdonald  NLG:[id number] DOB: 1926/01/21  DOA: 03/15/2020 PCP: Isaac Bliss, Rayford Halsted, MD    Brief Narrative:     Medical records reviewed and are as summarized below:  Alexander Mcdonald is an 87 y.o. male with medical history significant ofhistory of HTN, bladder spasm, BPH, bradycardia, history of PVCs, carotid artery disease, gout, glaucoma, thrombosed hemorrhoid, hyperlipidemia, hypertension, prostate cancer, rosacea, unsteadiness. Patient presented secondary to an elevated creatinine and found to have an AKI in addition to a LLL pneumonia. Nephrology consulted. IV antibiotics initiated. Not improving over last 6 days.  Per nursing, not eating much since admission.  Poor overall prognosis.  + COVID 2/11.  Transitioned to comfort care on 2/13.   Assessment/Plan:   Principal Problem:   Acute renal failure superimposed on stage 4 chronic kidney disease, unspecified acute renal failure type ([place]) Active Problems:   HYPERCHOLESTEROLEMIA   Essential hypertension   CAP (community acquired pneumonia)   Hyponatremia   Hypothyroidism   Aortic stenosis   Glaucoma   Malnutrition of moderate degree   Severe sepsis without septic shock (HCC)   Parapneumonic effusion   DNR (do not resuscitate)   Palliative care encounter   COVID 19 + -steroids -remdesivir -d/c'd labs as now comfort focused  AKI on CKD stage IV Baseline creatinine of about 2.2-2.4. Creatinine of 4.43 on admission. In setting of recent heart catheterization. Given IV fluids on admission. No hydronephrosis on CT abdomen/pelvis.   Community acquired pneumonia LLL pneumonia seen on CT imaging. Patient underwent thoracentesis on 2/5 yielding 400 mL of clear yellow fluid; fluid analysis suggest probable transudate but difficult to be exact secondary to undetectable protein in pleural fluid. Strep/legionella negative. No sputum culture available. Pleural fluid culture (2/5) with no growth to  date. Cytology with no evidence of malignancy. -ID consult per family request: start meropenem x 5 days - CT scan of chest :  1.  Moderate bilateral pleural effusions with consolidative changes of the majority of the left lower lobe and partial right lower lobe which may represent atelectasis or infiltrate. Underlying mass is not excluded. Clinical correlation and follow-up to resolution recommended. 2. Bilateral upper lobe patchy and streaky densities most consistent with pneumonia, likely viral or atypical in etiology. Including COVID-19. Clinical correlation and follow-up to resolution Recommended. -now comfort focused  Acute respiratory failure with hypoxia -now comfort focused  Acute urinary retention -comfort focused  Elevated AST/ALT/ALP -now comfort focused  Aortic stenosis -seen by cards on 2/8: After careful consideration of his multiple co-morbidities including advanced age, CKD stage 4, poor functional status and severe AS he is not felt to be a candidate for TAVR. We will plan to arrange cardiac follow with Dr. Sallyanne Kuster after his current hospitalization and will attempt medical management with careful diuresis. I would suspect that he will need to be discharged on Lasix, possibly the 40 mg three times weekly dosage as before. It would be reasonable to consider a palliative care consultation at this time.    Severe erosive esophagitis -was supposed to have taken protonix and carafate in 2019 s/p EGD but did not -protonix while here   Nutrition Status: Nutrition Problem: Moderate Malnutrition Etiology: chronic illness (CKD stage 4) Signs/Symptoms: moderate muscle depletion,moderate fat depletion Interventions: Ensure Enlive (each supplement provides 350kcal and 20 grams of protein),Magic cup,MVI    Prior to COVID-19 results becoming positive, patient was not eating well.  Suspect that COVID on top of his other  multiple medical issues lead to his  de-compensation.    Family Communication/Anticipated D/C date and plan/Code Status   Code Status: DNR Family Communication:  Daughter at bedside Disposition Plan: Status is: Inpatient  Remains inpatient appropriate because:Inpatient level of care appropriate due to severity of illness   Dispo: The patient is from: Home              Anticipated d/c is to: suspect in hospital death but defer to palliative care           Medical Consultants:   Cards Renal Palliative care ID    Subjective:   Speech difficult to understand but seems to have no complaints   Objective:    Vitals:   02/28/20 1200 02/28/20 2054 03-19-20 0754 19-Mar-2020 0855  BP:  (!) 99/48 92/61 (!) 82/39  Pulse:  84 69 63  Resp:  19 16   Temp: (!) 96 F (35.6 C) (!) 97.4 F (36.3 C) 97.6 F (36.4 C)   TempSrc: Axillary Axillary    SpO2:  95% 96%   Weight:      Height:        Intake/Output Summary (Last 24 hours) at 03/19/2020 1041 Last data filed at 02/28/2020 1841 Gross per 24 hour  Intake 690 ml  Output -  Net 690 ml   Filed Weights   02/26/20 1219 02/27/20 0400 02/28/20 0554  Weight: 68.7 kg 69.1 kg 70.1 kg    Exam:  General: Appearance:     Overweight male with dry mouth who appears comfortable in bed.  He has wet sounding voice and lungs, very weak cough                   Data Reviewed:   I have personally reviewed following labs and imaging studies:  Labs: Labs show the following:   Basic Metabolic Panel: Recent Labs  Lab 02/24/20 0052 02/25/20 0027 02/26/20 0537 02/27/20 0531 02/28/20 1422  NA 145 143 141 139 140  K 3.6 3.8 4.5 5.4* 6.1*  CL 110 107 104 104 103  CO2 24 24 24  21* 18*  GLUCOSE 204* 123* 119* 173* 182*  BUN 71* 66* 75* 96* 130*  CREATININE 2.43* 2.21* 2.34* 2.87* 4.04*  CALCIUM 9.6 10.0 10.5* 10.5* 9.7   GFR Estimated Creatinine Clearance: 10.4 mL/min (A) (by C-G formula based on SCr of 4.04 mg/dL (H)). Liver Function Tests: Recent Labs  Lab  02/24/20 0052 02/25/20 0027 02/26/20 0537 02/27/20 0531 02/28/20 1422  AST 175* 400* 107* 81* 156*  ALT 181* 339* 225* 159* 176*  ALKPHOS 237* 308* 271* 244* 203*  BILITOT 0.9 0.7 0.9 1.1 0.8  PROT 5.0* 4.7* 5.2* 5.3* 5.4*  ALBUMIN 2.2* 2.3* 2.5* 2.6* 2.8*   No results for input(s): LIPASE, AMYLASE in the last 168 hours. No results for input(s): AMMONIA in the last 168 hours. Coagulation profile No results for input(s): INR, PROTIME in the last 168 hours.  CBC: Recent Labs  Lab 02/24/20 0052 02/25/20 0027 02/26/20 0537 02/27/20 0531 02/28/20 0948  WBC 19.0* 18.2* 22.1* 26.7* 35.1*  NEUTROABS  --   --   --  19.9* 29.8*  HGB 7.9* 8.0* 7.8* 7.5* 7.5*  HCT 23.9* 24.6* 25.2* 24.6* 24.9*  MCV 91.9 93.5 97.7 98.4 102.0*  PLT 290 288 297 370 329   Cardiac Enzymes: No results for input(s): CKTOTAL, CKMB, CKMBINDEX, TROPONINI in the last 168 hours. BNP (last 3 results) No results for input(s): PROBNP in the last 8760  hours. CBG: No results for input(s): GLUCAP in the last 168 hours. D-Dimer: Recent Labs    02/27/20 1615 02/28/20 0948  DDIMER 1.82* 2.02*   Hgb A1c: No results for input(s): HGBA1C in the last 72 hours. Lipid Profile: No results for input(s): CHOL, HDL, LDLCALC, TRIG, CHOLHDL, LDLDIRECT in the last 72 hours. Thyroid function studies: No results for input(s): TSH, T4TOTAL, T3FREE, THYROIDAB in the last 72 hours.  Invalid input(s): FREET3 Anemia work up: Recent Labs    02/27/20 1615 02/28/20 0948  FERRITIN 1,241* 1,484*   Sepsis Labs: Recent Labs  Lab 02/25/20 0027 02/26/20 0537 02/27/20 0531 02/27/20 1912 02/28/20 0948  PROCALCITON  --   --   --  0.51  --   WBC 18.2* 22.1* 26.7*  --  35.1*    Microbiology Recent Results (from the past 240 hour(s))  Blood culture (routine single)     Status: None   Collection Time: 03/08/2020  1:56 PM   Specimen: BLOOD LEFT HAND  Result Value Ref Range Status   Specimen Description BLOOD LEFT HAND  Final    Special Requests   Final    BOTTLES DRAWN AEROBIC ONLY Blood Culture results may not be optimal due to an inadequate volume of blood received in culture bottles   Culture   Final    NO GROWTH 5 DAYS Performed at [facility], [place] [address], [place], [place] [address]    Report Status 02/24/2020 FINAL  Final  SARS Coronavirus 2 by RT PCR (hospital order, performed in [facility] hospital lab) Nasopharyngeal Nasopharyngeal Swab     Status: None   Collection Time: 02/22/2020  2:41 PM   Specimen: Nasopharyngeal Swab  Result Value Ref Range Status   SARS Coronavirus 2 NEGATIVE NEGATIVE Final    Comment: (NOTE) SARS-CoV-2 target nucleic acids are NOT DETECTED.  The SARS-CoV-2 RNA is generally detectable in upper and lower respiratory specimens during the acute phase of infection. The lowest concentration of SARS-CoV-2 viral copies this assay can detect is 250 copies / mL. A negative result does not preclude SARS-CoV-2 infection and should not be used as the sole basis for treatment or other patient management decisions.  A negative result may occur with improper specimen collection / handling, submission of specimen other than nasopharyngeal swab, presence of viral mutation(s) within the areas targeted by this assay, and inadequate number of viral copies (<250 copies / mL). A negative result must be combined with clinical observations, patient history, and epidemiological information.  Fact Sheet for Patients:   StrictlyIdeas.no  Fact Sheet for Healthcare Providers: BankingDealers.co.za  This test is not yet approved or  cleared by the [place] FDA and has been authorized for detection and/or diagnosis of SARS-CoV-2 by FDA under an Emergency Use Authorization (EUA).  This EUA will remain in effect (meaning this test can be used) for the duration of the COVID-19 declaration under Section 564(b)(1) of the Act, 21 U.S.C. section  360bbb-3(b)(1), unless the authorization is terminated or revoked sooner.  Performed at [facility], [place] [address], [place], [place] [address]   MRSA PCR Screening     Status: None   Collection Time: 03/15/2020  7:52 PM   Specimen: Nasopharyngeal  Result Value Ref Range Status   MRSA by PCR NEGATIVE NEGATIVE Final    Comment:        The GeneXpert MRSA Assay (FDA approved for NASAL specimens only), is one component of a comprehensive MRSA colonization surveillance program. It  is not intended to diagnose MRSA infection nor to guide or monitor treatment for MRSA infections. Performed at [facility], [place] [address] [place], [place] [address]   Urine culture     Status: Abnormal   Collection Time: 02/20/20 11:42 AM   Specimen: In/Out Cath Urine  Result Value Ref Range Status   Specimen Description IN/OUT CATH URINE  Final   Special Requests   Final    NONE Performed at [facility], [place] [address] [place], [place] [address]    Culture MULTIPLE SPECIES PRESENT, SUGGEST RECOLLECTION (A)  Final   Report Status 02/21/2020 FINAL  Final  Gram stain     Status: None   Collection Time: 02/21/20 11:14 AM   Specimen: PATH Cytology Pleural fluid  Result Value Ref Range Status   Specimen Description PLEURAL FLUID LEFT  Final   Special Requests NONE  Final   Gram Stain   Final    ABUNDANT WBC PRESENT,BOTH PMN AND MONONUCLEAR NO ORGANISMS SEEN Performed at [facility], [place] [address], [place], [place] 04/10/1926    Report Status 02/21/2020 FINAL  Final  Culture, body fluid-bottle     Status: None   Collection Time: 02/21/20 11:14 AM   Specimen: Pleura  Result Value Ref Range Status   Specimen Description PLEURAL FLUID LEFT  Final   Special Requests NONE  Final   Culture   Final    NO GROWTH 5 DAYS Performed at [place] [address] [place], [place] [address]    Report Status 02/26/2020 FINAL  Final  Culture, Urine     Status: Abnormal    Collection Time: 02/21/20  7:52 PM   Specimen: Urine, Catheterized  Result Value Ref Range Status   Specimen Description URINE, CATHETERIZED  Final   Special Requests   Final    STERILE CUP Performed at [facility], [place] [address] [place], [place] [address]    Culture MULTIPLE SPECIES PRESENT, SUGGEST RECOLLECTION (A)  Final   Report Status 02/23/2020 FINAL  Final  SARS CORONAVIRUS 2 (TAT 6-24 HRS) Nasopharyngeal Nasopharyngeal Swab     Status: Abnormal   Collection Time: 02/27/20  7:45 AM   Specimen: Nasopharyngeal Swab  Result Value Ref Range Status   SARS Coronavirus 2 POSITIVE (A) NEGATIVE Final    Comment: (NOTE) SARS-CoV-2 target nucleic acids are DETECTED.  The SARS-CoV-2 RNA is generally detectable in upper and lower respiratory specimens during the acute phase of infection. Positive results are indicative of the presence of SARS-CoV-2 RNA. Clinical correlation with patient history and other diagnostic information is  necessary to determine patient infection status. Positive results do not rule out bacterial infection or co-infection with other viruses.  The expected result is Negative.  Fact Sheet for Patients: SugarRoll.be  Fact Sheet for Healthcare Providers: https://www.woods-mathews.com/  This test is not yet approved or cleared by the [place] FDA and  has been authorized for detection and/or diagnosis of SARS-CoV-2 by FDA under an Emergency Use Authorization (EUA). This EUA will remain  in effect (meaning this test can be used) for the duration of the COVID-19 declaration under Section 564(b)(1) of the Act, 21 U. S.C. section 360bbb-3(b)(1), unless the authorization is terminated or revoked sooner.   Performed at [facility], [place] [address] [place],  [address]     Procedures and diagnostic studies:  No results found.  Medications:   . apraclonidine  1 drop Left Eye  TID  . bisacodyl   10 mg Rectal Once  . feeding supplement  237 mL Oral TID BM  . glycopyrrolate  0.4 mg Intravenous TID  . latanoprost  1 drop Left Eye QHS  . mouth rinse  15 mL Mouth Rinse BID  . melatonin  10 mg Oral QHS  . methylPREDNISolone (SOLU-MEDROL) injection  0.5 mg/kg Intravenous Q12H   Followed by  . [START ON 03/01/2020] predniSONE  50 mg Oral Daily  . pantoprazole (PROTONIX) IV  40 mg Intravenous Q12H  . tamsulosin  0.4 mg Oral QPC supper   Continuous Infusions: . meropenem (MERREM) IV 1 g (02/28/20 2002)  . remdesivir 100 mg in NS 100 mL 100 mg (02/28/20 0947)     LOS: 9 days   Geradine Girt  [facility]   How to contact the [facility] Attending or Consulting provider [place] or covering provider during after hours [place], for this patient?  1. Check the care team in [facility] and look for a) attending/consulting [facility] provider listed and b) the [facility] team listed 2. Log into [email or web address] and use [facility]'s universal password to access. If you do not have the password, please contact the hospital operator. 3. Locate the [facility] provider you are looking for under [facility] and page to a number that you can be directly reached. 4. If you still have difficulty reaching the provider, please page the [facility] (Director on Call) for the Hospitalists listed on amion for assistance.  03-17-20, 10:41 AM

## 2020-03-16 NOTE — Progress Notes (Signed)
Patient turned himself sideways in bed, repositioned.  Responded verbally when asked if he had pain.  Asked for a drink, mouth swabbed.  Condom catheter in place, patient clean and dry.  Call light placed next to patient.

## 2020-03-16 NOTE — Death Summary Note (Signed)
DEATH SUMMARY   Patient Details  Name: Alexander Mcdonald MRN: [id number] DOB: 02/29/1926  Admission/Discharge Information   Admit Date:  07-Mar-2020  Date of Death: Date of Death: March 17, 2020  Time of Death: Time of Death: 1640  Length of Stay: April 11, 2022  Referring Physician: Isaac Bliss, Rayford Halsted, MD    Diagnoses  Preliminary cause of death:  Secondary Diagnoses (including complications and co-morbidities):  Principal Problem:   Acute renal failure superimposed on stage 4 chronic kidney disease, unspecified acute renal failure type ([place]) Active Problems:   HYPERCHOLESTEROLEMIA   Essential hypertension   CAP (community acquired pneumonia)   Hyponatremia   Hypothyroidism   Aortic stenosis   Glaucoma   Malnutrition of moderate degree   Severe sepsis without septic shock ([place])   Parapneumonic effusion   DNR (do not resuscitate)   Palliative care encounter   COVID-19 virus infection   Brief Hospital Course (including significant findings, care, treatment, and services provided and events leading to death)  Alexander Mcdonald is a 87 y.o. year old male with past medical history of prostate cancer carotid artery disease, bradycardia, severe aortic stenosis, CKD stage IV, glaucoma, gout, severe erosive esophagitiswho was admitted on 2022-02-20with abnormal labs. On admission he was found to have acute on chronic kidney failure, pneumonia, and hyponatremia.  He was determined not to be a TAVR candidate.  He has received hospital care for 9+ days and he has not improved. Unfortunately his p.o. intake has been very poor through out this time. On 02/27/2020 he was found to be positive for Covid.   Due to his poor prognosis, palliative care was consulted and he was transitioned to comfort care    Pertinent Labs and Studies  Significant Diagnostic Studies CT Abdomen Pelvis Wo Contrast  Result Date: Mar 07, 2020 CLINICAL DATA:  Nausea, vomiting and elevated white blood cell count. EXAM: CT ABDOMEN  AND PELVIS WITHOUT CONTRAST TECHNIQUE: Multidetector CT imaging of the abdomen and pelvis was performed following the standard protocol without IV contrast. COMPARISON:  Prior study from 2015. FINDINGS: Lower chest: Dense left lower lobe airspace consolidation consistent with lobar pneumonia. A moderate-sized adjacent parapneumonic effusion is noted. There is also a small right pleural effusion with overlying atelectasis. The heart is within normal limits in size for age. No pericardial effusion. Aortic and coronary artery calcifications are noted. Hepatobiliary: No hepatic lesions or intrahepatic biliary dilatation. The gallbladder is unremarkable. No common bile duct dilatation. Pancreas: No mass, inflammation or ductal dilatation. Spleen: Normal size.  No focal lesions. Adrenals/Urinary Tract: The adrenal glands are unremarkable. Bilateral renal cysts are noted. No worrisome renal lesions or hydronephrosis. Renal artery calcifications but no renal calculi. The bladder is unremarkable. Stomach/Bowel: The stomach, duodenum, small bowel and colon are grossly normal without oral contrast. No acute inflammatory changes, mass lesions or obstructive findings. The terminal ileum and appendix are normal. There is significant sigmoid colon diverticulosis but no findings suspicious for acute diverticulitis. Vascular/Lymphatic: Advanced atherosclerotic calcifications involving the aorta and iliac arteries and branch vessels. No focal aneurysm. No mesenteric or retroperitoneal mass or adenopathy. Small scattered lymph nodes are noted. Reproductive: Enlarged prostate gland. The seminal vesicles are unremarkable. Other: No pelvic mass or adenopathy. No free pelvic fluid collections. No inguinal mass or adenopathy. No abdominal wall hernia or subcutaneous lesions. Musculoskeletal: No significant bony findings. Age related degenerative changes involving the spine and hips. IMPRESSION: 1. Dense left lower lobe airspace  consolidation consistent with lobar pneumonia. Associated moderate-sized adjacent parapneumonic effusion. 2. Small  right pleural effusion with overlying atelectasis. 3. No acute abdominal/pelvic findings, mass lesions or adenopathy. 4. Advanced atherosclerotic calcifications involving the aorta and iliac arteries and branch vessels. 5. Advanced sigmoid colon diverticulosis but no findings for acute diverticulitis. 6. Simple appearing renal cysts. 7. Enlarged prostate gland. Aortic Atherosclerosis (ICD10-I70.0). Electronically Signed   By: Marijo Sanes M.D.   On: 02/23/2020 16:47   DG Chest 1 View  Result Date: 02/21/2020 CLINICAL DATA:  Status post thoracentesis. EXAM: CHEST  1 VIEW COMPARISON:  February 19, 2020. FINDINGS: The heart size and mediastinal contours are within normal limits. No pneumothorax or significant pleural effusion is noted. Mild bibasilar subsegmental atelectasis is noted. The visualized skeletal structures are unremarkable. IMPRESSION: Mild bibasilar subsegmental atelectasis. No pneumothorax is noted. Electronically Signed   By: Marijo Conception M.D.   On: 02/21/2020 11:25   DG Chest 2 View  Result Date: 03/15/2020 CLINICAL DATA:  Dyspnea EXAM: CHEST - 2 VIEW COMPARISON:  02/10/2020 FINDINGS: The lungs are symmetrically well expanded. Interstitial pulmonary edema has significantly improved and is now mild in severity. More focal atelectasis or infiltrate is seen within the left lower lobe. Small bilateral pleural effusions have decreased in size. No pneumothorax. Cardiac size within normal limits. Changes of ankylosing spondylitis are again identified with find a syndesmophytes throughout the visualized thoracolumbar spine. There is, however, intervertebral disc space widening incidentally noted at L1-2, not well profiled on this examination, but stable since prior exam. IMPRESSION: Improving pulmonary edema, now mild in severity with decreasing small bilateral pleural effusions. More  focal pulmonary infiltrate within the left lower lobe. A superimposed focal infectious process or aspiration is difficult to exclude. Osseous changes in keeping with ankylosing spondylitis. Stable disc space widening at L1-2. Correlation for point tenderness would be helpful, however, as this may reflect changes of a Chance fracture. This is not well assessed on this examination and, if indicated, dedicated radiographs of the lumbar spine may be helpful for further evaluation. Electronically Signed   By: Fidela Salisbury MD   On: 02/18/2020 12:48   DG Chest 2 View  Result Date: 02/10/2020 CLINICAL DATA:  Shortness of breath. EXAM: CHEST - 2 VIEW COMPARISON:  10/30/2019. FINDINGS: Mediastinum and hilar structures normal. Heart size normal. Diffuse bilateral pulmonary infiltrates/edema. Small bilateral pleural effusions. No pneumothorax. Ankylosis thoracic spine. IMPRESSION: Diffuse bilateral pulmonary infiltrates/edema. Small bilateral pleural effusions. Electronically Signed   By: Marcello Moores  Register   On: 02/10/2020 05:33   CT CHEST WO CONTRAST  Result Date: 02/26/2020 CLINICAL DATA:  87 year old male with pleural effusion. Rule out malignancy. EXAM: CT CHEST WITHOUT CONTRAST TECHNIQUE: Multidetector CT imaging of the chest was performed following the standard protocol without IV contrast. COMPARISON:  Chest radiograph dated 02/23/2020. FINDINGS: Evaluation of this exam is limited in the absence of intravenous contrast. Cardiovascular: Mild cardiomegaly. No pericardial effusion. Advanced 3 vessel coronary vascular calcification. There is calcification of the mitral annulus and aortic valve. Moderate atherosclerotic calcification of the thoracic aorta. No aneurysmal dilatation. The central pulmonary arteries are unremarkable. Mediastinum/Nodes: No hilar adenopathy. Upper mediastinal lymph node measures 11 mm in short axis to the right of the esophagus (27/3). The esophagus is grossly unremarkable. No mediastinal  fluid collection. Lungs/Pleura: Moderate bilateral pleural effusions. There is consolidative changes of the majority of the left lower lobe and partial right lower lobe which may represent atelectasis or infiltrate. Underlying mass is not excluded. Bilateral upper lobe patchy and streaky densities most consistent with pneumonia, likely viral or atypical  in etiology. Including COVID-19. Clinical correlation and follow-up to resolution recommended. No pneumothorax. Mucus secretions noted in the left mainstem bronchus extending into the left lower lobe bronchus. The central airways are otherwise patent. Upper Abdomen: No acute abnormality. Musculoskeletal: Osteopenia with degenerative changes of the spine. No acute osseous pathology. There is diffuse thoracic spine syndesmophyte, likely ankylosing spondylitis. IMPRESSION: 1. Moderate bilateral pleural effusions with consolidative changes of the majority of the left lower lobe and partial right lower lobe which may represent atelectasis or infiltrate. Underlying mass is not excluded. Clinical correlation and follow-up to resolution recommended. 2. Bilateral upper lobe patchy and streaky densities most consistent with pneumonia, likely viral or atypical in etiology. Including COVID-19. Clinical correlation and follow-up to resolution recommended. 3. Aortic Atherosclerosis (ICD10-I70.0). Electronically Signed   By: Anner Crete M.D.   On: 02/26/2020 19:21   CARDIAC CATHETERIZATION  Result Date: 02/13/2020 1. Mild diffuse calcific CAD without significant obstruction 2. Severe aortic stenosis with mean gradient 52 mmHg and AVA 0.76 square cm 3. Normal right heart pressures Recommend:  Metabolic panel as outpatient next week to follow CKD  Stable for DC home this evening - discussed plan with rounding teams and patient's daughter  Outpatient evaluation for TAVR as planned  DG CHEST PORT 1 VIEW  Result Date: 02/23/2020 CLINICAL DATA:  Weakness. EXAM: PORTABLE  CHEST 1 VIEW COMPARISON:  February 21, 2020. FINDINGS: Stable cardiomediastinal silhouette. No pneumothorax is noted. Increased bibasilar opacities is noted concerning for subsegmental atelectasis. Bony thorax is unremarkable. IMPRESSION: Increased bibasilar opacities are noted concerning for subsegmental atelectasis. Aortic Atherosclerosis (ICD10-I70.0). Electronically Signed   By: Marijo Conception M.D.   On: 02/23/2020 08:03   ECHOCARDIOGRAM COMPLETE  Result Date: 02/10/2020    ECHOCARDIOGRAM REPORT   Patient Name:   ISHMAIL MCMANAMON Date of Exam: 02/10/2020 Medical Rec #:  [id number]       Height:       65.0 in Accession #:    [id number]      Weight:       165.0 lb Date of Birth:  [birth year]        BSA:          1.823 m Patient Age:    50 years        BP:           155/67 mmHg Patient Gender: M               HR:           67 bpm. Exam Location:  Inpatient Procedure: 2D Echo, Color Doppler and Cardiac Doppler Indications:    Aortic stenosis I35.0  History:        Patient has prior history of Echocardiogram examinations, most                 recent 09/01/2019. Risk Factors:Hypertension.  Sonographer:    Bernadene Person RDCS Referring Phys: [id number] [place]  1. Left ventricular ejection fraction, by estimation, is 60 to 65%. The left ventricle has normal function. The left ventricle has no regional wall motion abnormalities. There is mild left ventricular hypertrophy. Left ventricular diastolic parameters are consistent with Grade I diastolic dysfunction (impaired relaxation).  2. Right ventricular systolic function is normal. The right ventricular size is normal. Tricuspid regurgitation signal is inadequate for assessing PA pressure.  3. Moderate pleural effusion in the left lateral region.  4. The mitral valve is normal in structure. Mild mitral valve regurgitation. No evidence of  mitral stenosis.  5. The aortic valve is tricuspid. Aortic valve regurgitation is trivial. Moderate to severe  aortic valve stenosis.  6. The inferior vena cava is normal in size with greater than 50% respiratory variability, suggesting right atrial pressure of 3 mmHg. FINDINGS  Left Ventricle: Left ventricular ejection fraction, by estimation, is 60 to 65%. The left ventricle has normal function. The left ventricle has no regional wall motion abnormalities. The left ventricular internal cavity size was normal in size. There is  mild left ventricular hypertrophy. Left ventricular diastolic parameters are consistent with Grade I diastolic dysfunction (impaired relaxation). Right Ventricle: The right ventricular size is normal.Right ventricular systolic function is normal. Tricuspid regurgitation signal is inadequate for assessing PA pressure. The tricuspid regurgitant velocity is 2.69 m/s, and with an assumed right atrial pressure of 3 mmHg, the estimated right ventricular systolic pressure is 32.9 mmHg. Left Atrium: Left atrial size was normal in size. Right Atrium: Right atrial size was normal in size. Pericardium: There is no evidence of pericardial effusion. Mitral Valve: The mitral valve is normal in structure. Mild mitral annular calcification. Mild mitral valve regurgitation. No evidence of mitral valve stenosis. Tricuspid Valve: The tricuspid valve is normal in structure. Tricuspid valve regurgitation is trivial. No evidence of tricuspid stenosis. Aortic Valve: The aortic valve is tricuspid. Aortic valve regurgitation is trivial. Aortic regurgitation PHT measures 658 msec. Moderate to severe aortic stenosis is present. Aortic valve mean gradient measures 30.0 mmHg. Aortic valve peak gradient measures 45.5 mmHg. Aortic valve area, by VTI measures 0.68 cm. Pulmonic Valve: The pulmonic valve was normal in structure. Pulmonic valve regurgitation is mild. No evidence of pulmonic stenosis. Aorta: The aortic root is normal in size and structure. Venous: The inferior vena cava is normal in size with greater than 50%  respiratory variability, suggesting right atrial pressure of 3 mmHg. IAS/Shunts: No atrial level shunt detected by color flow Doppler. Additional Comments: There is a moderate pleural effusion in the left lateral region.  LEFT VENTRICLE PLAX 2D LVIDd:         3.80 cm LVIDs:         2.20 cm LV PW:         1.20 cm LV IVS:        1.30 cm LVOT diam:     1.90 cm LV SV:         72 LV SV Index:   40 LVOT Area:     2.84 cm  RIGHT VENTRICLE TAPSE (M-mode): 2.0 cm LEFT ATRIUM             Index       RIGHT ATRIUM           Index LA diam:        4.40 cm 2.41 cm/m  RA Area:     15.30 cm LA Vol (A2C):   45.7 ml 25.07 ml/m RA Volume:   37.50 ml  20.57 ml/m LA Vol (A4C):   48.0 ml 26.33 ml/m LA Biplane Vol: 50.8 ml 27.87 ml/m  AORTIC VALVE AV Area (Vmax):    0.66 cm AV Area (Vmean):   0.63 cm AV Area (VTI):     0.68 cm AV Vmax:           337.33 cm/s AV Vmean:          260.667 cm/s AV VTI:            1.062 m AV Peak Grad:      45.5 mmHg  AV Mean Grad:      30.0 mmHg LVOT Vmax:         79.03 cm/s LVOT Vmean:        58.267 cm/s LVOT VTI:          0.256 m LVOT/AV VTI ratio: 0.24 AI PHT:            658 msec  AORTA Ao Root diam: 2.80 cm Ao Asc diam:  2.80 cm MR PISA:        0.39 cm TRICUSPID VALVE MR PISA Radius: 0.25 cm  TR Peak grad:   28.9 mmHg                          TR Vmax:        269.00 cm/s                           SHUNTS                          Systemic VTI:  0.26 m                          Systemic Diam: 1.90 cm Kirk Ruths MD Electronically signed by Kirk Ruths MD Signature Date/Time: 02/10/2020/2:55:07 PM    Final    VAS US CAROTID  Result Date: 02/11/2020 Carotid Arterial Duplex Study Indications:       Pre-TAVR. Risk Factors:      Hypertension. Comparison Study:  No prior studies. Performing Technologist: Oliver Hum RVT  Examination Guidelines: A complete evaluation includes B-mode imaging, spectral Doppler, color Doppler, and power Doppler as needed of all accessible portions of each vessel.  Bilateral testing is considered an integral part of a complete examination. Limited examinations for reoccurring indications may be performed as noted.  Right Carotid Findings: +----------+--------+--------+--------+-----------------------+--------+           PSV cm/sEDV cm/sStenosisPlaque Description     Comments +----------+--------+--------+--------+-----------------------+--------+ CCA Prox  46      3               smooth and heterogenous         +----------+--------+--------+--------+-----------------------+--------+ CCA Distal55      7               smooth and heterogenous         +----------+--------+--------+--------+-----------------------+--------+ ICA Prox  85      8               calcific                        +----------+--------+--------+--------+-----------------------+--------+ ICA Distal118     12                                     tortuous +----------+--------+--------+--------+-----------------------+--------+ ECA       148     1                                               +----------+--------+--------+--------+-----------------------+--------+ +----------+--------+-------+--------+-------------------+           PSV cm/sEDV cmsDescribeArm Pressure (mmHG) +----------+--------+-------+--------+-------------------+ [id number]                                        +----------+--------+-------+--------+-------------------+ +---------+--------+--+--------+--+---------+  VertebralPSV cm/s42EDV cm/s12Antegrade +---------+--------+--+--------+--+---------+  Left Carotid Findings: +----------+--------+--------+--------+--------------------------+--------+           PSV cm/sEDV cm/sStenosisPlaque Description        Comments +----------+--------+--------+--------+--------------------------+--------+ CCA Prox  86      7               smooth and heterogenous             +----------+--------+--------+--------+--------------------------+--------+ CCA Distal79      10              smooth and heterogenous            +----------+--------+--------+--------+--------------------------+--------+ ICA Prox  113     20              irregular and heterogenous         +----------+--------+--------+--------+--------------------------+--------+ ICA Distal96      20                                        tortuous +----------+--------+--------+--------+--------------------------+--------+ ECA       90                                                         +----------+--------+--------+--------+--------------------------+--------+ +----------+--------+--------+--------+-------------------+           PSV cm/sEDV cm/sDescribeArm Pressure (mmHG) +----------+--------+--------+--------+-------------------+ Subclavian102                                         +----------+--------+--------+--------+-------------------+ +---------+--------+--+--------+--+---------+ VertebralPSV cm/s64EDV cm/s14Antegrade +---------+--------+--+--------+--+---------+   Summary: Right Carotid: Velocities in the right ICA are consistent with a 1-39% stenosis. Left Carotid: Velocities in the left ICA are consistent with a 1-39% stenosis. Vertebrals: Bilateral vertebral arteries demonstrate antegrade flow. *See table(s) above for measurements and observations.  Electronically signed by Harold Barban MD on 02/11/2020 at 9:40:11 PM.    Final    US Abdomen Limited RUQ (LIVER/GB)  Result Date: 02/23/2020 CLINICAL DATA:  Elevated LFTs EXAM: ULTRASOUND ABDOMEN LIMITED RIGHT UPPER QUADRANT COMPARISON:  None. FINDINGS: Gallbladder: No gallstones or wall thickening visualized. No sonographic Murphy sign noted by sonographer. Common bile duct: Diameter: Normal caliber, 3 mm Liver: No focal abnormality. Normal echotexture. No biliary ductal dilatation. Portal vein is patent on color Doppler  imaging with normal direction of blood flow towards the liver. Other: Incidentally noted is right effusion. IMPRESSION: No acute findings in the right upper abdomen. Right pleural effusion. Electronically Signed   By: Rolm Baptise M.D.   On: 02/23/2020 19:12   US THORACENTESIS ASP PLEURAL SPACE W/IMG GUIDE  Result Date: 02/21/2020 INDICATION: Patient with a history of pneumonia presents today with left pleural effusion. Interventional radiology asked to perform a therapeutic and diagnostic thoracentesis. EXAM: ULTRASOUND GUIDED THORACENTESIS MEDICATIONS: 1% lidocaine 10 mL COMPLICATIONS: None immediate. PROCEDURE: An ultrasound guided thoracentesis was thoroughly discussed with the patient and questions answered. The benefits, risks, alternatives and complications were also discussed. The patient understands and wishes to proceed with the procedure. Written consent was obtained. Ultrasound was performed to localize and mark an adequate pocket of fluid in the left chest. The area was then prepped and draped in the normal  sterile fashion. 1% Lidocaine was used for local anesthesia. Under ultrasound guidance a 6 Fr Safe-T-Centesis catheter was introduced. Thoracentesis was performed. The catheter was removed and a dressing applied. FINDINGS: A total of approximately 400 mL of clear yellow fluid was removed. Samples were sent to the laboratory as requested by the clinical team. IMPRESSION: Successful ultrasound guided left thoracentesis yielding 400 mL of pleural fluid. Read by: Soyla Dryer, NP Electronically Signed   By: Sandi Mariscal M.D.   On: 02/21/2020 11:37    Microbiology Recent Results (from the past 240 hour(s))  Gram stain     Status: None   Collection Time: 02/21/20 11:14 AM   Specimen: PATH Cytology Pleural fluid  Result Value Ref Range Status   Specimen Description PLEURAL FLUID LEFT  Final   Special Requests NONE  Final   Gram Stain   Final    ABUNDANT WBC PRESENT,BOTH PMN AND  MONONUCLEAR NO ORGANISMS SEEN Performed at [facility], [address] [place], [place] [address]    Report Status 02/21/2020 FINAL  Final  Culture, body fluid-bottle     Status: None   Collection Time: 02/21/20 11:14 AM   Specimen: Pleura  Result Value Ref Range Status   Specimen Description PLEURAL FLUID LEFT  Final   Special Requests NONE  Final   Culture   Final    NO GROWTH 5 DAYS Performed at [facility] [address] [place], [place] [address]    Report Status 02/26/2020 FINAL  Final  Culture, Urine     Status: Abnormal   Collection Time: 02/21/20  7:52 PM   Specimen: Urine, Catheterized  Result Value Ref Range Status   Specimen Description URINE, CATHETERIZED  Final   Special Requests   Final    STERILE CUP Performed at [facility], [place] [address], [place], [place] [address]    Culture MULTIPLE SPECIES PRESENT, SUGGEST RECOLLECTION (A)  Final   Report Status 02/23/2020 FINAL  Final  SARS CORONAVIRUS 2 (TAT 6-24 HRS) Nasopharyngeal Nasopharyngeal Swab     Status: Abnormal   Collection Time: 02/27/20  7:45 AM   Specimen: Nasopharyngeal Swab  Result Value Ref Range Status   SARS Coronavirus 2 POSITIVE (A) NEGATIVE Final    Comment: (NOTE) SARS-CoV-2 target nucleic acids are DETECTED.  The SARS-CoV-2 RNA is generally detectable in upper and lower respiratory specimens during the acute phase of infection. Positive results are indicative of the presence of SARS-CoV-2 RNA. Clinical correlation with patient history and other diagnostic information is  necessary to determine patient infection status. Positive results do not rule out bacterial infection or co-infection with other viruses.  The expected result is Negative.  Fact Sheet for Patients: SugarRoll.be  Fact Sheet for Healthcare Providers: https://www.woods-mathews.com/  This test is not yet approved or cleared by the [place] FDA and  has  been authorized for detection and/or diagnosis of SARS-CoV-2 by FDA under an Emergency Use Authorization (EUA). This EUA will remain  in effect (meaning this test can be used) for the duration of the COVID-19 declaration under Section 564(b)(1) of the Act, 21 U. S.C. section 360bbb-3(b)(1), unless the authorization is terminated or revoked sooner.   Performed at [facility], [place] [address], [place], [place] [address]     Lab Basic Metabolic Panel: Recent Labs  Lab 02/24/20 0052 02/25/20 0027 02/26/20 0537 02/27/20 0531 02/28/20 1422  NA 145 143 141 139 140  K 3.6 3.8 4.5 5.4* 6.1*  CL 110 107 104 104 103  CO2 24 24 24  21* 18*  GLUCOSE 204* 123* 119* 173* 182*  BUN 71* 66* 75* 96* 130*  CREATININE 2.43* 2.21* 2.34* 2.87* 4.04*  CALCIUM 9.6 10.0 10.5* 10.5* 9.7   Liver Function Tests: Recent Labs  Lab 02/24/20 0052 02/25/20 0027 02/26/20 0537 02/27/20 0531 02/28/20 1422  AST 175* 400* 107* 81* 156*  ALT 181* 339* 225* 159* 176*  ALKPHOS 237* 308* 271* 244* 203*  BILITOT 0.9 0.7 0.9 1.1 0.8  PROT 5.0* 4.7* 5.2* 5.3* 5.4*  ALBUMIN 2.2* 2.3* 2.5* 2.6* 2.8*   No results for input(s): LIPASE, AMYLASE in the last 168 hours. No results for input(s): AMMONIA in the last 168 hours. CBC: Recent Labs  Lab 02/24/20 0052 02/25/20 0027 02/26/20 0537 02/27/20 0531 02/28/20 0948  WBC 19.0* 18.2* 22.1* 26.7* 35.1*  NEUTROABS  --   --   --  19.9* 29.8*  HGB 7.9* 8.0* 7.8* 7.5* 7.5*  HCT 23.9* 24.6* 25.2* 24.6* 24.9*  MCV 91.9 93.5 97.7 98.4 102.0*  PLT 290 288 297 370 329   Cardiac Enzymes: No results for input(s): CKTOTAL, CKMB, CKMBINDEX, TROPONINI in the last 168 hours. Sepsis Labs: Recent Labs  Lab 02/25/20 0027 02/26/20 0537 02/27/20 0531 02/27/20 1912 02/28/20 0948  PROCALCITON  --   --   --  0.51  --   WBC 18.2* 22.1* 26.7*  --  35.1*      Joshalyn Ancheta U Diannah Rindfleisch 03/01/2020, 2:50 PM

## 2020-03-16 DEATH — deceased

## 2020-03-22 ENCOUNTER — Telehealth: Payer: Medicare Other | Admitting: Physician Assistant

## 2020-05-03 ENCOUNTER — Ambulatory Visit: Payer: Medicare Other | Admitting: Podiatry

## 2021-07-11 IMAGING — CR DG CHEST 1V
1 series · 1 of 1 positions shown · non-contrast
Comparison: February 19, 2020.

CLINICAL DATA: Status post thoracentesis.

EXAM:
CHEST  1 VIEW

[chest ap]
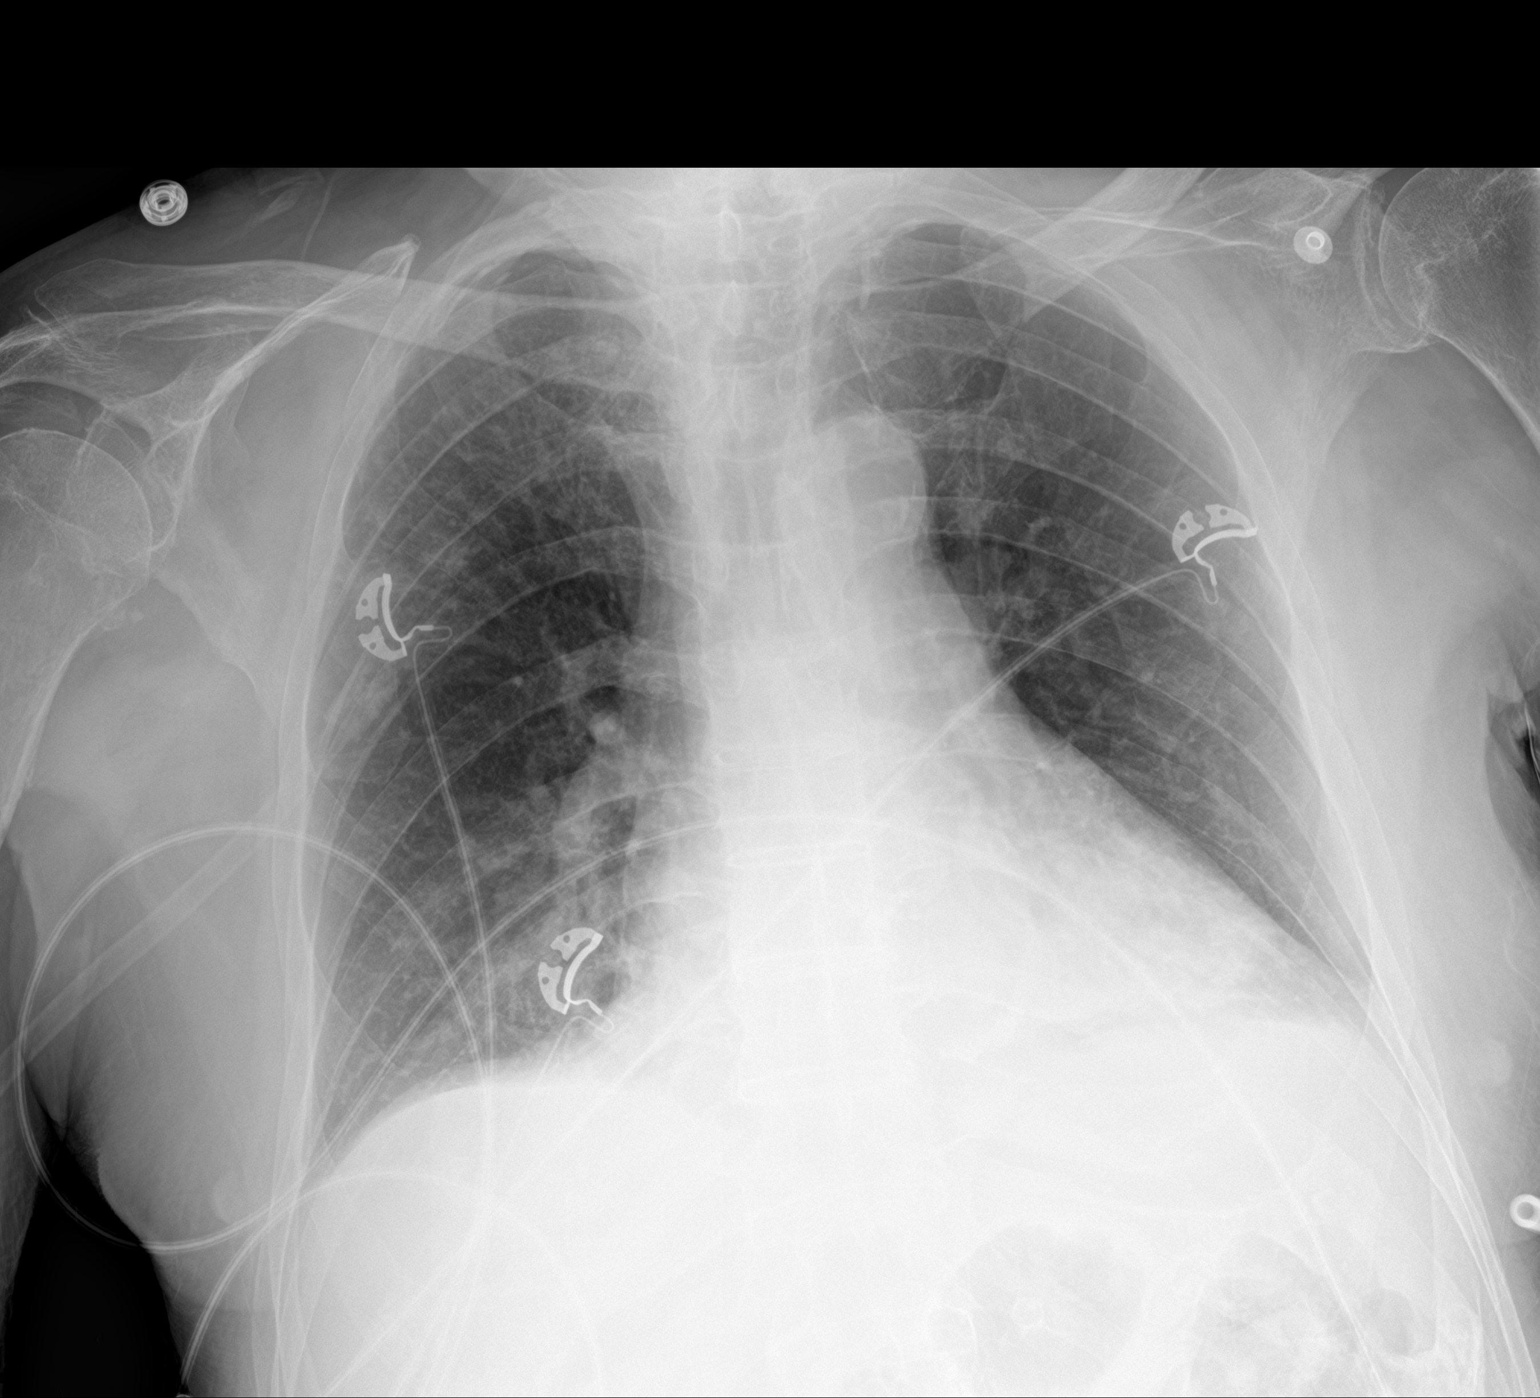

[1 of 1 positions shown; findings below may reference images not displayed]

FINDINGS: The heart size and mediastinal contours are within normal limits. No
pneumothorax or significant pleural effusion is noted. Mild
bibasilar subsegmental atelectasis is noted. The visualized skeletal
structures are unremarkable.
IMPRESSION: Mild bibasilar subsegmental atelectasis. No pneumothorax is noted.
